# Patient Record
Sex: Male | Born: 1960 | Race: Black or African American | Hispanic: No | Smoking: Current every day smoker
Health system: Southern US, Community
[De-identification: ages and names within clinical notes are randomized; demographics above are authoritative.]

## PROBLEM LIST (undated history)

## (undated) DIAGNOSIS — I509 Heart failure, unspecified: Secondary | ICD-10-CM

## (undated) DIAGNOSIS — F1010 Alcohol abuse, uncomplicated: Secondary | ICD-10-CM

## (undated) DIAGNOSIS — I428 Other cardiomyopathies: Secondary | ICD-10-CM

## (undated) DIAGNOSIS — M545 Low back pain, unspecified: Secondary | ICD-10-CM

## (undated) DIAGNOSIS — F32A Depression, unspecified: Secondary | ICD-10-CM

## (undated) DIAGNOSIS — F329 Major depressive disorder, single episode, unspecified: Secondary | ICD-10-CM

## (undated) DIAGNOSIS — R7401 Elevation of levels of liver transaminase levels: Secondary | ICD-10-CM

## (undated) DIAGNOSIS — R740 Nonspecific elevation of levels of transaminase and lactic acid dehydrogenase [LDH]: Secondary | ICD-10-CM

## (undated) DIAGNOSIS — I10 Essential (primary) hypertension: Secondary | ICD-10-CM

## (undated) DIAGNOSIS — J449 Chronic obstructive pulmonary disease, unspecified: Secondary | ICD-10-CM

## (undated) DIAGNOSIS — Z720 Tobacco use: Secondary | ICD-10-CM

## (undated) DIAGNOSIS — G4730 Sleep apnea, unspecified: Secondary | ICD-10-CM

## (undated) DIAGNOSIS — Z9689 Presence of other specified functional implants: Secondary | ICD-10-CM

## (undated) DIAGNOSIS — H9190 Unspecified hearing loss, unspecified ear: Secondary | ICD-10-CM

## (undated) DIAGNOSIS — K219 Gastro-esophageal reflux disease without esophagitis: Secondary | ICD-10-CM

## (undated) DIAGNOSIS — B1920 Unspecified viral hepatitis C without hepatic coma: Secondary | ICD-10-CM

## (undated) DIAGNOSIS — M1990 Unspecified osteoarthritis, unspecified site: Secondary | ICD-10-CM

## (undated) DIAGNOSIS — G8929 Other chronic pain: Secondary | ICD-10-CM

## (undated) DIAGNOSIS — K279 Peptic ulcer, site unspecified, unspecified as acute or chronic, without hemorrhage or perforation: Secondary | ICD-10-CM

## (undated) DIAGNOSIS — E785 Hyperlipidemia, unspecified: Secondary | ICD-10-CM

## (undated) DIAGNOSIS — E781 Pure hyperglyceridemia: Secondary | ICD-10-CM

## (undated) DIAGNOSIS — N182 Chronic kidney disease, stage 2 (mild): Secondary | ICD-10-CM

## (undated) DIAGNOSIS — K861 Other chronic pancreatitis: Secondary | ICD-10-CM

## (undated) DIAGNOSIS — F1910 Other psychoactive substance abuse, uncomplicated: Secondary | ICD-10-CM

## (undated) HISTORY — DX: Alcohol abuse, uncomplicated: F10.10

## (undated) HISTORY — PX: HARDWARE REMOVAL: SHX979

## (undated) HISTORY — DX: Chronic kidney disease, stage 2 (mild): N18.2

## (undated) HISTORY — PX: BACK SURGERY: SHX140

## (undated) HISTORY — DX: Other chronic pancreatitis: K86.1

## (undated) HISTORY — DX: Other psychoactive substance abuse, uncomplicated: F19.10

## (undated) HISTORY — PX: POSTERIOR LUMBAR FUSION: SHX6036

## (undated) HISTORY — DX: Peptic ulcer, site unspecified, unspecified as acute or chronic, without hemorrhage or perforation: K27.9

## (undated) HISTORY — DX: Other cardiomyopathies: I42.8

## (undated) HISTORY — DX: Nonspecific elevation of levels of transaminase and lactic acid dehydrogenase (ldh): R74.0

## (undated) HISTORY — PX: INCISION AND DRAINAGE ABSCESS: SHX5864

## (undated) HISTORY — DX: Chronic obstructive pulmonary disease, unspecified: J44.9

## (undated) HISTORY — PX: WRIST SURGERY: SHX841

## (undated) HISTORY — PX: FOOT SURGERY: SHX648

## (undated) HISTORY — DX: Elevation of levels of liver transaminase levels: R74.01

## (undated) HISTORY — DX: Hyperlipidemia, unspecified: E78.5

## (undated) HISTORY — DX: Gastro-esophageal reflux disease without esophagitis: K21.9

## (undated) HISTORY — DX: Tobacco use: Z72.0

## (undated) HISTORY — DX: Essential (primary) hypertension: I10

## (undated) HISTORY — DX: Pure hyperglyceridemia: E78.1

---
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
## 1998-01-16 ENCOUNTER — Encounter: Admission: RE | Admit: 1998-01-16 | Discharge: 1998-01-16 | Payer: Self-pay | Admitting: Obstetrics

## 1998-04-20 ENCOUNTER — Encounter: Admission: RE | Admit: 1998-04-20 | Discharge: 1998-04-20 | Payer: Self-pay | Admitting: Internal Medicine

## 1998-04-20 ENCOUNTER — Encounter: Payer: Self-pay | Admitting: Internal Medicine

## 1999-03-20 ENCOUNTER — Encounter (INDEPENDENT_AMBULATORY_CARE_PROVIDER_SITE_OTHER): Payer: Self-pay | Admitting: Internal Medicine

## 1999-04-16 ENCOUNTER — Ambulatory Visit (HOSPITAL_COMMUNITY): Admission: RE | Admit: 1999-04-16 | Discharge: 1999-04-16 | Payer: Self-pay | Admitting: Orthopaedic Surgery

## 1999-04-30 ENCOUNTER — Ambulatory Visit (HOSPITAL_COMMUNITY): Admission: RE | Admit: 1999-04-30 | Discharge: 1999-04-30 | Payer: Self-pay | Admitting: Orthopaedic Surgery

## 1999-05-14 ENCOUNTER — Ambulatory Visit (HOSPITAL_COMMUNITY): Admission: RE | Admit: 1999-05-14 | Discharge: 1999-05-14 | Payer: Self-pay | Admitting: Orthopaedic Surgery

## 2000-05-05 ENCOUNTER — Encounter: Admission: RE | Admit: 2000-05-05 | Discharge: 2000-05-05 | Payer: Self-pay | Admitting: Internal Medicine

## 2000-05-27 ENCOUNTER — Encounter: Admission: RE | Admit: 2000-05-27 | Discharge: 2000-05-27 | Payer: Self-pay | Admitting: Hematology and Oncology

## 2000-12-24 ENCOUNTER — Ambulatory Visit (HOSPITAL_COMMUNITY): Admission: RE | Admit: 2000-12-24 | Discharge: 2000-12-24 | Payer: Self-pay | Admitting: Internal Medicine

## 2000-12-24 ENCOUNTER — Encounter: Admission: RE | Admit: 2000-12-24 | Discharge: 2000-12-24 | Payer: Self-pay | Admitting: Internal Medicine

## 2001-01-02 ENCOUNTER — Ambulatory Visit (HOSPITAL_COMMUNITY): Admission: RE | Admit: 2001-01-02 | Discharge: 2001-01-02 | Payer: Self-pay | Admitting: Internal Medicine

## 2001-01-18 ENCOUNTER — Encounter: Admission: RE | Admit: 2001-01-18 | Discharge: 2001-01-18 | Payer: Self-pay | Admitting: Internal Medicine

## 2001-02-01 ENCOUNTER — Encounter: Admission: RE | Admit: 2001-02-01 | Discharge: 2001-02-01 | Payer: Self-pay | Admitting: Internal Medicine

## 2001-07-14 ENCOUNTER — Encounter: Admission: RE | Admit: 2001-07-14 | Discharge: 2001-07-14 | Payer: Self-pay | Admitting: Internal Medicine

## 2001-07-30 ENCOUNTER — Encounter: Admission: RE | Admit: 2001-07-30 | Discharge: 2001-07-30 | Payer: Self-pay | Admitting: Internal Medicine

## 2001-12-31 ENCOUNTER — Encounter: Admission: RE | Admit: 2001-12-31 | Discharge: 2001-12-31 | Payer: Self-pay | Admitting: Internal Medicine

## 2002-01-04 ENCOUNTER — Encounter: Admission: RE | Admit: 2002-01-04 | Discharge: 2002-01-04 | Payer: Self-pay | Admitting: Internal Medicine

## 2002-11-29 ENCOUNTER — Encounter: Admission: RE | Admit: 2002-11-29 | Discharge: 2002-11-29 | Payer: Self-pay | Admitting: Internal Medicine

## 2002-12-13 ENCOUNTER — Encounter: Admission: RE | Admit: 2002-12-13 | Discharge: 2002-12-13 | Payer: Self-pay | Admitting: Internal Medicine

## 2003-02-16 ENCOUNTER — Encounter: Admission: RE | Admit: 2003-02-16 | Discharge: 2003-02-16 | Payer: Self-pay | Admitting: Internal Medicine

## 2003-02-25 ENCOUNTER — Encounter: Payer: Self-pay | Admitting: *Deleted

## 2003-02-25 ENCOUNTER — Ambulatory Visit (HOSPITAL_COMMUNITY): Admission: RE | Admit: 2003-02-25 | Discharge: 2003-02-25 | Payer: Self-pay | Admitting: *Deleted

## 2003-02-27 ENCOUNTER — Encounter: Admission: RE | Admit: 2003-02-27 | Discharge: 2003-02-27 | Payer: Self-pay | Admitting: Internal Medicine

## 2003-03-04 ENCOUNTER — Encounter: Payer: Self-pay | Admitting: Internal Medicine

## 2003-03-04 ENCOUNTER — Ambulatory Visit (HOSPITAL_COMMUNITY): Admission: RE | Admit: 2003-03-04 | Discharge: 2003-03-04 | Payer: Self-pay | Admitting: Internal Medicine

## 2003-03-07 ENCOUNTER — Encounter: Admission: RE | Admit: 2003-03-07 | Discharge: 2003-03-07 | Payer: Self-pay | Admitting: Internal Medicine

## 2004-02-07 ENCOUNTER — Encounter: Admission: RE | Admit: 2004-02-07 | Discharge: 2004-02-07 | Payer: Self-pay | Admitting: Internal Medicine

## 2004-06-02 ENCOUNTER — Emergency Department (HOSPITAL_COMMUNITY): Admission: EM | Admit: 2004-06-02 | Discharge: 2004-06-02 | Payer: Self-pay | Admitting: Emergency Medicine

## 2004-06-05 ENCOUNTER — Emergency Department (HOSPITAL_COMMUNITY): Admission: EM | Admit: 2004-06-05 | Discharge: 2004-06-05 | Payer: Self-pay | Admitting: Emergency Medicine

## 2004-06-16 ENCOUNTER — Emergency Department (HOSPITAL_COMMUNITY): Admission: EM | Admit: 2004-06-16 | Discharge: 2004-06-16 | Payer: Self-pay | Admitting: Emergency Medicine

## 2005-10-23 ENCOUNTER — Emergency Department (HOSPITAL_COMMUNITY): Admission: EM | Admit: 2005-10-23 | Discharge: 2005-10-23 | Payer: Self-pay | Admitting: Emergency Medicine

## 2005-10-26 ENCOUNTER — Emergency Department (HOSPITAL_COMMUNITY): Admission: EM | Admit: 2005-10-26 | Discharge: 2005-10-26 | Payer: Self-pay | Admitting: Family Medicine

## 2005-10-27 ENCOUNTER — Inpatient Hospital Stay (HOSPITAL_COMMUNITY): Admission: EM | Admit: 2005-10-27 | Discharge: 2005-10-31 | Payer: Self-pay | Admitting: Emergency Medicine

## 2006-09-07 ENCOUNTER — Emergency Department (HOSPITAL_COMMUNITY): Admission: EM | Admit: 2006-09-07 | Discharge: 2006-09-07 | Payer: Self-pay | Admitting: Emergency Medicine

## 2007-01-08 ENCOUNTER — Ambulatory Visit: Payer: Self-pay | Admitting: Internal Medicine

## 2007-01-08 ENCOUNTER — Encounter (INDEPENDENT_AMBULATORY_CARE_PROVIDER_SITE_OTHER): Payer: Self-pay | Admitting: Internal Medicine

## 2007-01-11 LAB — CONVERTED CEMR LAB
Amphetamine Screen, Ur: NEGATIVE
BUN: 11 mg/dL
Barbiturate Quant, Ur: NEGATIVE
Benzodiazepines.: NEGATIVE
CO2: 27 meq/L
Calcium: 9.1 mg/dL
Chloride: 105 meq/L
Cocaine Metabolites: POSITIVE — AB
Creatinine, Ser: 1.27 mg/dL
Creatinine,U: 272.1 mg/dL
Glucose, Bld: 96 mg/dL
Marijuana Metabolite: NEGATIVE
Methadone: NEGATIVE
Opiates: NEGATIVE
Phencyclidine (PCP): NEGATIVE
Potassium: 4.7 meq/L
Propoxyphene: NEGATIVE
Sodium: 141 meq/L

## 2007-01-12 ENCOUNTER — Encounter (INDEPENDENT_AMBULATORY_CARE_PROVIDER_SITE_OTHER): Payer: Self-pay | Admitting: Internal Medicine

## 2007-01-12 DIAGNOSIS — F1410 Cocaine abuse, uncomplicated: Secondary | ICD-10-CM | POA: Insufficient documentation

## 2007-01-13 ENCOUNTER — Ambulatory Visit (HOSPITAL_COMMUNITY): Admission: RE | Admit: 2007-01-13 | Discharge: 2007-01-13 | Payer: Self-pay | Admitting: Internal Medicine

## 2007-01-14 ENCOUNTER — Telehealth (INDEPENDENT_AMBULATORY_CARE_PROVIDER_SITE_OTHER): Payer: Self-pay | Admitting: Internal Medicine

## 2007-01-20 ENCOUNTER — Ambulatory Visit: Payer: Self-pay | Admitting: Internal Medicine

## 2007-01-22 ENCOUNTER — Encounter (INDEPENDENT_AMBULATORY_CARE_PROVIDER_SITE_OTHER): Payer: Self-pay | Admitting: *Deleted

## 2007-01-22 ENCOUNTER — Encounter: Payer: Self-pay | Admitting: Licensed Clinical Social Worker

## 2007-01-27 ENCOUNTER — Telehealth: Payer: Self-pay | Admitting: *Deleted

## 2007-02-17 ENCOUNTER — Encounter (INDEPENDENT_AMBULATORY_CARE_PROVIDER_SITE_OTHER): Payer: Self-pay | Admitting: Internal Medicine

## 2007-02-24 ENCOUNTER — Ambulatory Visit: Payer: Self-pay | Admitting: Internal Medicine

## 2007-02-24 ENCOUNTER — Encounter: Payer: Self-pay | Admitting: Licensed Clinical Social Worker

## 2007-03-05 ENCOUNTER — Telehealth: Payer: Self-pay | Admitting: *Deleted

## 2007-03-08 ENCOUNTER — Telehealth: Payer: Self-pay | Admitting: *Deleted

## 2007-03-09 ENCOUNTER — Encounter (INDEPENDENT_AMBULATORY_CARE_PROVIDER_SITE_OTHER): Payer: Self-pay | Admitting: Internal Medicine

## 2007-03-09 ENCOUNTER — Ambulatory Visit: Payer: Self-pay | Admitting: Infectious Disease

## 2007-03-09 LAB — CONVERTED CEMR LAB
Amphetamine Screen, Ur: NEGATIVE
Barbiturate Quant, Ur: NEGATIVE
Benzodiazepines.: NEGATIVE
Cocaine Metabolites: POSITIVE — AB
Creatinine,U: 40.9 mg/dL
Marijuana Metabolite: NEGATIVE
Methadone: NEGATIVE
Opiates: NEGATIVE
Phencyclidine (PCP): NEGATIVE
Propoxyphene: NEGATIVE

## 2007-03-12 ENCOUNTER — Encounter (INDEPENDENT_AMBULATORY_CARE_PROVIDER_SITE_OTHER): Payer: Self-pay | Admitting: Internal Medicine

## 2007-03-12 ENCOUNTER — Ambulatory Visit: Payer: Self-pay | Admitting: Internal Medicine

## 2007-04-30 ENCOUNTER — Telehealth: Payer: Self-pay | Admitting: *Deleted

## 2007-05-31 ENCOUNTER — Telehealth (INDEPENDENT_AMBULATORY_CARE_PROVIDER_SITE_OTHER): Payer: Self-pay | Admitting: Internal Medicine

## 2007-06-04 ENCOUNTER — Telehealth: Payer: Self-pay | Admitting: *Deleted

## 2007-08-19 ENCOUNTER — Ambulatory Visit: Payer: Self-pay | Admitting: Internal Medicine

## 2007-08-19 DIAGNOSIS — N529 Male erectile dysfunction, unspecified: Secondary | ICD-10-CM | POA: Insufficient documentation

## 2007-08-31 ENCOUNTER — Telehealth: Payer: Self-pay | Admitting: *Deleted

## 2007-09-17 ENCOUNTER — Ambulatory Visit: Payer: Self-pay | Admitting: Internal Medicine

## 2007-09-17 ENCOUNTER — Encounter (INDEPENDENT_AMBULATORY_CARE_PROVIDER_SITE_OTHER): Payer: Self-pay | Admitting: *Deleted

## 2007-09-17 DIAGNOSIS — M5106 Intervertebral disc disorders with myelopathy, lumbar region: Secondary | ICD-10-CM | POA: Insufficient documentation

## 2007-09-22 ENCOUNTER — Telehealth (INDEPENDENT_AMBULATORY_CARE_PROVIDER_SITE_OTHER): Payer: Self-pay | Admitting: Internal Medicine

## 2007-10-14 ENCOUNTER — Encounter (INDEPENDENT_AMBULATORY_CARE_PROVIDER_SITE_OTHER): Payer: Self-pay | Admitting: Internal Medicine

## 2007-10-21 ENCOUNTER — Telehealth: Payer: Self-pay | Admitting: *Deleted

## 2007-10-22 ENCOUNTER — Telehealth: Payer: Self-pay | Admitting: *Deleted

## 2007-11-23 ENCOUNTER — Encounter (INDEPENDENT_AMBULATORY_CARE_PROVIDER_SITE_OTHER): Payer: Self-pay | Admitting: Internal Medicine

## 2007-11-30 ENCOUNTER — Encounter (INDEPENDENT_AMBULATORY_CARE_PROVIDER_SITE_OTHER): Payer: Self-pay | Admitting: Internal Medicine

## 2007-12-26 ENCOUNTER — Inpatient Hospital Stay (HOSPITAL_COMMUNITY): Admission: EM | Admit: 2007-12-26 | Discharge: 2007-12-31 | Payer: Self-pay | Admitting: Emergency Medicine

## 2007-12-26 ENCOUNTER — Encounter (INDEPENDENT_AMBULATORY_CARE_PROVIDER_SITE_OTHER): Payer: Self-pay | Admitting: Internal Medicine

## 2007-12-26 ENCOUNTER — Ambulatory Visit: Payer: Self-pay | Admitting: Internal Medicine

## 2007-12-27 ENCOUNTER — Encounter (INDEPENDENT_AMBULATORY_CARE_PROVIDER_SITE_OTHER): Payer: Self-pay | Admitting: Hospitalist

## 2007-12-28 ENCOUNTER — Encounter (INDEPENDENT_AMBULATORY_CARE_PROVIDER_SITE_OTHER): Payer: Self-pay | Admitting: Internal Medicine

## 2007-12-30 ENCOUNTER — Encounter (INDEPENDENT_AMBULATORY_CARE_PROVIDER_SITE_OTHER): Payer: Self-pay | Admitting: Cardiology

## 2008-01-04 ENCOUNTER — Encounter: Payer: Self-pay | Admitting: Licensed Clinical Social Worker

## 2008-01-05 ENCOUNTER — Encounter: Admission: AD | Admit: 2008-01-05 | Discharge: 2008-01-05 | Payer: Self-pay | Admitting: Dentistry

## 2008-01-05 ENCOUNTER — Ambulatory Visit: Payer: Self-pay | Admitting: Dentistry

## 2008-01-06 ENCOUNTER — Telehealth: Payer: Self-pay | Admitting: Licensed Clinical Social Worker

## 2008-02-15 ENCOUNTER — Encounter (INDEPENDENT_AMBULATORY_CARE_PROVIDER_SITE_OTHER): Payer: Self-pay | Admitting: Internal Medicine

## 2008-03-23 ENCOUNTER — Telehealth: Payer: Self-pay | Admitting: Licensed Clinical Social Worker

## 2008-04-06 ENCOUNTER — Encounter (INDEPENDENT_AMBULATORY_CARE_PROVIDER_SITE_OTHER): Payer: Self-pay | Admitting: Internal Medicine

## 2008-04-06 ENCOUNTER — Ambulatory Visit: Payer: Self-pay | Admitting: Internal Medicine

## 2008-04-17 ENCOUNTER — Encounter: Admission: RE | Admit: 2008-04-17 | Discharge: 2008-07-16 | Payer: Self-pay | Admitting: Neurosurgery

## 2008-04-25 ENCOUNTER — Encounter (INDEPENDENT_AMBULATORY_CARE_PROVIDER_SITE_OTHER): Payer: Self-pay | Admitting: Internal Medicine

## 2008-06-01 ENCOUNTER — Encounter (INDEPENDENT_AMBULATORY_CARE_PROVIDER_SITE_OTHER): Payer: Self-pay | Admitting: Internal Medicine

## 2008-06-01 ENCOUNTER — Ambulatory Visit: Payer: Self-pay | Admitting: *Deleted

## 2008-06-02 ENCOUNTER — Ambulatory Visit (HOSPITAL_COMMUNITY): Admission: RE | Admit: 2008-06-02 | Discharge: 2008-06-02 | Payer: Self-pay | Admitting: *Deleted

## 2008-06-02 LAB — CONVERTED CEMR LAB
BUN: 13 mg/dL (ref 6–23)
CO2: 23 meq/L (ref 19–32)
Calcium: 9 mg/dL (ref 8.4–10.5)
Chloride: 106 meq/L (ref 96–112)
Creatinine, Ser: 1.32 mg/dL (ref 0.40–1.50)
Glucose, Bld: 110 mg/dL — ABNORMAL HIGH (ref 70–99)
Potassium: 4.4 meq/L (ref 3.5–5.3)
Sodium: 140 meq/L (ref 135–145)
Vit D, 1,25-Dihydroxy: 6 — ABNORMAL LOW (ref 30–89)

## 2008-06-27 ENCOUNTER — Encounter (INDEPENDENT_AMBULATORY_CARE_PROVIDER_SITE_OTHER): Payer: Self-pay | Admitting: Internal Medicine

## 2008-07-03 DIAGNOSIS — F1910 Other psychoactive substance abuse, uncomplicated: Secondary | ICD-10-CM | POA: Insufficient documentation

## 2008-07-17 ENCOUNTER — Encounter: Admission: RE | Admit: 2008-07-17 | Discharge: 2008-07-17 | Payer: Self-pay | Admitting: Neurosurgery

## 2008-07-24 ENCOUNTER — Telehealth: Payer: Self-pay | Admitting: Infectious Diseases

## 2008-07-28 DIAGNOSIS — B1920 Unspecified viral hepatitis C without hepatic coma: Secondary | ICD-10-CM

## 2008-07-28 HISTORY — DX: Unspecified viral hepatitis C without hepatic coma: B19.20

## 2008-08-07 ENCOUNTER — Telehealth (INDEPENDENT_AMBULATORY_CARE_PROVIDER_SITE_OTHER): Payer: Self-pay | Admitting: Internal Medicine

## 2008-08-09 ENCOUNTER — Encounter (INDEPENDENT_AMBULATORY_CARE_PROVIDER_SITE_OTHER): Payer: Self-pay | Admitting: Internal Medicine

## 2008-08-23 ENCOUNTER — Ambulatory Visit: Payer: Self-pay | Admitting: Infectious Disease

## 2008-09-05 ENCOUNTER — Encounter (INDEPENDENT_AMBULATORY_CARE_PROVIDER_SITE_OTHER): Payer: Self-pay | Admitting: Internal Medicine

## 2008-09-26 ENCOUNTER — Encounter (INDEPENDENT_AMBULATORY_CARE_PROVIDER_SITE_OTHER): Payer: Self-pay | Admitting: Internal Medicine

## 2008-11-03 ENCOUNTER — Telehealth: Payer: Self-pay | Admitting: Licensed Clinical Social Worker

## 2008-11-06 ENCOUNTER — Encounter: Payer: Self-pay | Admitting: Licensed Clinical Social Worker

## 2008-11-06 ENCOUNTER — Ambulatory Visit: Payer: Self-pay | Admitting: Internal Medicine

## 2008-11-07 ENCOUNTER — Encounter (INDEPENDENT_AMBULATORY_CARE_PROVIDER_SITE_OTHER): Payer: Self-pay | Admitting: Internal Medicine

## 2008-11-15 ENCOUNTER — Ambulatory Visit (HOSPITAL_COMMUNITY): Admission: RE | Admit: 2008-11-15 | Discharge: 2008-11-15 | Payer: Self-pay | Admitting: Internal Medicine

## 2008-11-28 ENCOUNTER — Encounter (INDEPENDENT_AMBULATORY_CARE_PROVIDER_SITE_OTHER): Payer: Self-pay | Admitting: Internal Medicine

## 2008-12-05 DIAGNOSIS — M539 Dorsopathy, unspecified: Secondary | ICD-10-CM | POA: Insufficient documentation

## 2008-12-13 ENCOUNTER — Encounter (INDEPENDENT_AMBULATORY_CARE_PROVIDER_SITE_OTHER): Payer: Self-pay | Admitting: Internal Medicine

## 2008-12-13 ENCOUNTER — Ambulatory Visit: Payer: Self-pay | Admitting: Infectious Disease

## 2008-12-13 LAB — CONVERTED CEMR LAB
BUN: 16 mg/dL (ref 6–23)
CO2: 24 meq/L (ref 19–32)
Calcium: 9.9 mg/dL (ref 8.4–10.5)
Chloride: 107 meq/L (ref 96–112)
Creatinine, Ser: 1.32 mg/dL (ref 0.40–1.50)
GFR calc Af Amer: >60 mL/min (ref >60)
GFR calc non Af Amer: 58 mL/min — ABNORMAL LOW (ref >60)
Glucose, Bld: 99 mg/dL (ref 70–99)
Potassium: 4.9 meq/L (ref 3.5–5.3)
Sodium: 143 meq/L (ref 135–145)

## 2009-02-09 ENCOUNTER — Encounter (INDEPENDENT_AMBULATORY_CARE_PROVIDER_SITE_OTHER): Payer: Self-pay | Admitting: Internal Medicine

## 2009-02-09 ENCOUNTER — Encounter: Payer: Self-pay | Admitting: Internal Medicine

## 2009-02-09 ENCOUNTER — Ambulatory Visit: Payer: Self-pay | Admitting: Infectious Diseases

## 2009-02-09 LAB — CONVERTED CEMR LAB
BUN: 18 mg/dL (ref 6–23)
Bilirubin Urine: NEGATIVE
CO2: 25 meq/L (ref 19–32)
Calcium: 9.8 mg/dL (ref 8.4–10.5)
Chlamydia, Swab/Urine, PCR: NEGATIVE
Chloride: 105 meq/L (ref 96–112)
Creatinine, Ser: 1.33 mg/dL (ref 0.40–1.50)
GC Probe Amp, Urine: NEGATIVE
Glucose, Bld: 80 mg/dL (ref 70–99)
HIV 1 RNA Quant: <48 copies/mL (ref <48)
HIV-1 RNA Quant, Log: <1.68 (ref <1.68)
Hemoglobin, Urine: NEGATIVE
Ketones, ur: NEGATIVE mg/dL
Leukocytes, UA: NEGATIVE
Nitrite: NEGATIVE
Potassium: 4.6 meq/L (ref 3.5–5.3)
Protein, ur: NEGATIVE mg/dL
Sodium: 142 meq/L (ref 135–145)
Specific Gravity, Urine: 1.025 (ref 1.005–1.030)
Urine Glucose: NEGATIVE mg/dL
Urobilinogen, UA: 0.2 (ref 0.0–1.0)
pH: 5.5 (ref 5.0–8.0)

## 2009-02-15 ENCOUNTER — Encounter: Admission: RE | Admit: 2009-02-15 | Discharge: 2009-02-15 | Payer: Self-pay | Admitting: Internal Medicine

## 2009-02-22 ENCOUNTER — Telehealth: Payer: Self-pay | Admitting: *Deleted

## 2009-02-28 ENCOUNTER — Ambulatory Visit: Payer: Self-pay | Admitting: Internal Medicine

## 2009-03-01 ENCOUNTER — Encounter (INDEPENDENT_AMBULATORY_CARE_PROVIDER_SITE_OTHER): Payer: Self-pay | Admitting: Internal Medicine

## 2009-03-02 ENCOUNTER — Telehealth (INDEPENDENT_AMBULATORY_CARE_PROVIDER_SITE_OTHER): Payer: Self-pay | Admitting: Internal Medicine

## 2009-03-02 LAB — CONVERTED CEMR LAB
HCV Ab: REACTIVE — AB
Hep B Core Total Ab: NEGATIVE
Hep B S Ab: NEGATIVE
Hepatitis B Surface Ag: NEGATIVE

## 2009-03-08 ENCOUNTER — Ambulatory Visit: Payer: Self-pay | Admitting: Internal Medicine

## 2009-03-08 LAB — CONVERTED CEMR LAB
ALT: 18 units/L (ref 0–53)
AST: 24 units/L (ref 0–37)
Albumin: 4.5 g/dL (ref 3.5–5.2)
Alkaline Phosphatase: 71 units/L (ref 39–117)
Bilirubin, Direct: 0.1 mg/dL (ref 0.0–0.3)
Indirect Bilirubin: 0.2 mg/dL (ref 0.0–0.9)
Total Bilirubin: 0.3 mg/dL (ref 0.3–1.2)
Total Protein: 7.1 g/dL (ref 6.0–8.3)

## 2009-03-13 ENCOUNTER — Ambulatory Visit (HOSPITAL_COMMUNITY): Admission: RE | Admit: 2009-03-13 | Discharge: 2009-03-13 | Payer: Self-pay | Admitting: Internal Medicine

## 2009-03-13 ENCOUNTER — Ambulatory Visit: Payer: Self-pay | Admitting: Internal Medicine

## 2009-03-13 ENCOUNTER — Encounter: Payer: Self-pay | Admitting: Internal Medicine

## 2009-03-13 DIAGNOSIS — F17200 Nicotine dependence, unspecified, uncomplicated: Secondary | ICD-10-CM | POA: Insufficient documentation

## 2009-03-19 ENCOUNTER — Telehealth (INDEPENDENT_AMBULATORY_CARE_PROVIDER_SITE_OTHER): Payer: Self-pay | Admitting: Internal Medicine

## 2009-03-28 ENCOUNTER — Ambulatory Visit: Payer: Self-pay | Admitting: Internal Medicine

## 2009-03-28 ENCOUNTER — Encounter (INDEPENDENT_AMBULATORY_CARE_PROVIDER_SITE_OTHER): Payer: Self-pay | Admitting: Internal Medicine

## 2009-04-10 ENCOUNTER — Ambulatory Visit: Payer: Self-pay | Admitting: Internal Medicine

## 2009-05-01 ENCOUNTER — Encounter: Payer: Self-pay | Admitting: Internal Medicine

## 2009-07-17 ENCOUNTER — Ambulatory Visit: Payer: Self-pay | Admitting: Internal Medicine

## 2009-07-17 LAB — CONVERTED CEMR LAB
Bilirubin Urine: NEGATIVE
Blood in Urine, dipstick: NEGATIVE
Glucose, Urine, Semiquant: NEGATIVE
Ketones, urine, test strip: NEGATIVE
Nitrite: NEGATIVE
Protein, U semiquant: 30
Specific Gravity, Urine: >=1.03
Urobilinogen, UA: 0.2
WBC Urine, dipstick: NEGATIVE
pH: 5

## 2009-07-18 ENCOUNTER — Ambulatory Visit: Payer: Self-pay | Admitting: Internal Medicine

## 2009-07-18 ENCOUNTER — Encounter (INDEPENDENT_AMBULATORY_CARE_PROVIDER_SITE_OTHER): Payer: Self-pay | Admitting: Internal Medicine

## 2009-07-18 LAB — CONVERTED CEMR LAB
Bacteria, UA: NONE SEEN
Bacteria, UA: NONE SEEN
Bilirubin Urine: NEGATIVE
Bilirubin Urine: NEGATIVE
Casts: NONE SEEN /lpf
Casts: NONE SEEN /lpf
Chlamydia, Swab/Urine, PCR: NEGATIVE
Crystals: NONE SEEN
GC Probe Amp, Urine: NEGATIVE
Hemoglobin, Urine: NEGATIVE
Hemoglobin, Urine: NEGATIVE
Ketones, ur: NEGATIVE mg/dL
Ketones, ur: NEGATIVE mg/dL
Leukocytes, UA: NEGATIVE
Leukocytes, UA: NEGATIVE
Nitrite: NEGATIVE
Nitrite: NEGATIVE
Protein, ur: NEGATIVE mg/dL
Protein, ur: NEGATIVE mg/dL
RBC / HPF: NONE SEEN (ref <3)
RBC / HPF: NONE SEEN (ref <3)
Specific Gravity, Urine: 1.029 (ref 1.005–1.0)
Specific Gravity, Urine: 1.026 (ref 1.005–1.0)
Urine Glucose: NEGATIVE mg/dL
Urine Glucose: NEGATIVE mg/dL
Urobilinogen, UA: 0.2 (ref 0.0–1.0)
Urobilinogen, UA: 0.2 (ref 0.0–1.0)
WBC, UA: NONE SEEN cells/hpf (ref <3)
pH: 5.5 (ref 5.0–8.0)
pH: 5.5 (ref 5.0–8.0)

## 2009-08-01 ENCOUNTER — Encounter (INDEPENDENT_AMBULATORY_CARE_PROVIDER_SITE_OTHER): Payer: Self-pay | Admitting: Internal Medicine

## 2009-08-01 ENCOUNTER — Ambulatory Visit (HOSPITAL_COMMUNITY): Admission: RE | Admit: 2009-08-01 | Discharge: 2009-08-01 | Payer: Self-pay | Admitting: Internal Medicine

## 2009-08-01 ENCOUNTER — Ambulatory Visit: Payer: Self-pay | Admitting: Internal Medicine

## 2009-08-02 ENCOUNTER — Encounter (INDEPENDENT_AMBULATORY_CARE_PROVIDER_SITE_OTHER): Payer: Self-pay | Admitting: Internal Medicine

## 2009-08-06 LAB — CONVERTED CEMR LAB
ALT: 21 units/L (ref 0–53)
AST: 20 units/L (ref 0–37)
Albumin: 5 g/dL (ref 3.5–5.2)
Alkaline Phosphatase: 85 units/L (ref 39–117)
BUN: 18 mg/dL (ref 6–23)
Basophils Absolute: 0 10*3/uL (ref 0.0–0.1)
Basophils Relative: 0 % (ref 0–1)
CO2: 18 meq/L — ABNORMAL LOW (ref 19–32)
Calcium: 9.5 mg/dL (ref 8.4–10.5)
Chloride: 102 meq/L (ref 96–112)
Creatinine, Ser: 1.2 mg/dL (ref 0.40–1.50)
Eosinophils Absolute: 0.1 10*3/uL (ref 0.0–0.7)
Eosinophils Relative: 2 % (ref 0–5)
Glucose, Bld: 97 mg/dL (ref 70–99)
HCT: 48.9 % (ref 39.0–52.0)
Hemoglobin: 15.8 g/dL (ref 13.0–17.0)
Lipase: 44 units/L (ref 0–75)
Lymphocytes Relative: 49 % — ABNORMAL HIGH (ref 12–46)
Lymphs Abs: 3.5 10*3/uL (ref 0.7–4.0)
MCHC: 32.3 g/dL (ref 30.0–36.0)
MCV: 77.6 fL — ABNORMAL LOW (ref >=78.0)
Monocytes Absolute: 0.5 10*3/uL (ref 0.1–1.0)
Monocytes Relative: 7 % (ref 3–12)
Neutro Abs: 3 10*3/uL (ref 1.7–7.7)
Neutrophils Relative %: 42 % — ABNORMAL LOW (ref 43–77)
PSA: 0.5 ng/mL (ref 0.10–4.00)
Platelets: 218 10*3/uL (ref 150–400)
Potassium: 4.2 meq/L (ref 3.5–5.3)
RBC: 6.3 M/uL — ABNORMAL HIGH (ref 4.22–5.81)
RDW: 15.9 % — ABNORMAL HIGH (ref 11.5–15.5)
Sodium: 138 meq/L (ref 135–145)
Total Bilirubin: 0.6 mg/dL (ref 0.3–1.2)
Total Protein: 7.9 g/dL (ref 6.0–8.3)
WBC: 7.1 10*3/uL (ref 4.0–10.5)

## 2009-08-30 ENCOUNTER — Encounter: Admission: RE | Admit: 2009-08-30 | Discharge: 2009-11-28 | Payer: Self-pay | Admitting: Neurosurgery

## 2009-09-10 ENCOUNTER — Ambulatory Visit: Payer: Self-pay | Admitting: Internal Medicine

## 2009-10-01 ENCOUNTER — Encounter: Payer: Self-pay | Admitting: Internal Medicine

## 2009-10-15 ENCOUNTER — Encounter: Payer: Self-pay | Admitting: Internal Medicine

## 2009-10-26 ENCOUNTER — Encounter: Payer: Self-pay | Admitting: Internal Medicine

## 2009-11-06 ENCOUNTER — Telehealth: Payer: Self-pay | Admitting: Licensed Clinical Social Worker

## 2009-11-08 ENCOUNTER — Encounter: Payer: Self-pay | Admitting: Internal Medicine

## 2009-12-05 ENCOUNTER — Telehealth: Payer: Self-pay | Admitting: Internal Medicine

## 2009-12-06 ENCOUNTER — Telehealth: Payer: Self-pay | Admitting: Internal Medicine

## 2010-03-05 ENCOUNTER — Encounter: Payer: Self-pay | Admitting: *Deleted

## 2010-03-19 ENCOUNTER — Ambulatory Visit: Payer: Self-pay | Admitting: Internal Medicine

## 2010-03-19 DIAGNOSIS — M21619 Bunion of unspecified foot: Secondary | ICD-10-CM | POA: Insufficient documentation

## 2010-03-19 DIAGNOSIS — L299 Pruritus, unspecified: Secondary | ICD-10-CM | POA: Insufficient documentation

## 2010-03-19 DIAGNOSIS — F528 Other sexual dysfunction not due to a substance or known physiological condition: Secondary | ICD-10-CM | POA: Insufficient documentation

## 2010-04-30 ENCOUNTER — Encounter: Payer: Self-pay | Admitting: Internal Medicine

## 2010-07-04 ENCOUNTER — Ambulatory Visit (HOSPITAL_COMMUNITY)
Admission: RE | Admit: 2010-07-04 | Discharge: 2010-07-04 | Payer: Self-pay | Source: Home / Self Care | Attending: Internal Medicine | Admitting: Internal Medicine

## 2010-07-04 ENCOUNTER — Ambulatory Visit: Payer: Self-pay | Admitting: Internal Medicine

## 2010-07-04 DIAGNOSIS — R059 Cough, unspecified: Secondary | ICD-10-CM | POA: Insufficient documentation

## 2010-07-04 DIAGNOSIS — M25569 Pain in unspecified knee: Secondary | ICD-10-CM | POA: Insufficient documentation

## 2010-07-04 DIAGNOSIS — R05 Cough: Secondary | ICD-10-CM

## 2010-07-25 ENCOUNTER — Telehealth: Payer: Self-pay | Admitting: Internal Medicine

## 2010-08-02 ENCOUNTER — Ambulatory Visit: Admit: 2010-08-02 | Payer: Self-pay

## 2010-08-02 ENCOUNTER — Ambulatory Visit: Admit: 2010-08-02 | Payer: Self-pay | Admitting: Family Medicine

## 2010-08-06 ENCOUNTER — Telehealth: Payer: Self-pay | Admitting: *Deleted

## 2010-08-12 ENCOUNTER — Ambulatory Visit: Admit: 2010-08-12 | Payer: Self-pay | Admitting: Family Medicine

## 2010-08-27 NOTE — Assessment & Plan Note (Signed)
Summary: per gayle f/u [mkj]   Vital Signs:  Patient profile:   50 year old male Height:      69 inches (175.26 cm) Weight:      198.7 pounds (90.32 kg) BMI:     29.45 Temp:     99.1 degrees F (37.28 degrees C) oral Pulse rate:   65 / minute BP sitting:   121 / 76  (right arm) Cuff size:   large  Vitals Entered By: Krystal Eaton Duncan Dull) (February 28, 2009 3:41 PM) CC: pt c/o coughing mostly with lying down and sore throat ongoing for about , and lab results from last visit Pain Assessment Patient in pain? yes     Location: back Intensity: 6 Type: sharp Onset of pain  started 3 days ago  Nutritional Status BMI of 25 - 29 = overweight  Have you ever been in a relationship where you felt threatened, hurt or afraid?No   Does patient need assistance? Functional Status Self care Ambulation Normal   Primary Care Provider:  Marinda Elk MD  CC:  pt c/o coughing mostly with lying down and sore throat ongoing for about and and lab results from last visit.  History of Present Illness: Pt has had continued throat pain and cough which have been going on for about 2 months.  Throat is "sore."  Cough is productive of some sputum but the patient has not examined it.  He denies fevers, or chills.  He endorses diarrhea up to 5 times a day which comes and goes for many months.  Some times green in color, non-bloody.  Has not coughed up blood.  Denies weight loss.  Denies night sweats.  Denies runny nose or sneezing.  Pt has cut back on smoking, but now has from4-8 cigarettes a week. Has no known sick contacts, has not traveled, no known TB contacts.  Denies hemoptysis.  Is coughing daily.  It has been stable as far as frequency and sputum production.  Throat is more irritated when he is lying down.  Has not had similar episodes in the past.  No pets.  Preventive Screening-Counseling & Management  Alcohol-Tobacco     Alcohol type: beer /drinks at times     Smoking Status:  quit > 6 months     Smoking Cessation Counseling: yes     Packs/Day: 1 pack per week     Year Started: at the age of 63     Year Quit: 1/ 2009  Current Medications (verified): 1)  Viagra 100 Mg  Tabs (Sildenafil Citrate) .... Take One Tablet One Hour Before Sex 2)  Neurontin 300 Mg Caps (Gabapentin) .... Take 1 Tablet By Mouth Two Times A Day As Needed For Numbness 3)  Hydrochlorothiazide 25 Mg Tabs (Hydrochlorothiazide) .... One By Mouth Once Daily 4)  Lisinopril 10 Mg Tabs (Lisinopril) .... Take 1 Tablet By Mouth Once A Day 5)  Omeprazole 40 Mg Cpdr (Omeprazole) .... Take 1 Tablet By Mouth Once A Day  Allergies: 1)  ! Vicodin  Social History: The patient lives in [place] He is disabled He has a 10 pack year history of smoking He abuses cocaine, though he states he has been clean for a while as of 11/09 He last drank alcohol one year ago  Review of Systems General:  Denies chills and fever. ENT:  Complains of sore throat. CV:  Denies chest pain or discomfort and palpitations. Resp:  Complains of cough, sputum productive, and wheezing; denies chest  discomfort, coughing up blood, and shortness of breath. GI:  Complains of diarrhea; denies abdominal pain, bloody stools, constipation, nausea, and vomiting. Neuro:  Denies numbness and weakness. Allergy:  Complains of itching eyes.  Physical Exam  General:  NAD alert Head:  normocephalic and atraumatic.   Nose:  no nasal discharge.  some nasal mucosa erythema Mouth:  no exudate, mild erythema Neck:  no cervical LAD, supple.   Lungs:  normal respiratory effort, normal breath sounds, no crackles, and no wheezes.   Heart:  normal rate, regular rhythm, no murmur, no gallop, and no rub.   Abdomen:  soft, non-tender, normal bowel sounds, no distention, no masses, no guarding, no rigidity, and no hepatomegaly.   Neurologic:  non-focal   Impression & Recommendations:  Problem # 1:  COUGH (ICD-786.2)  Pt has been coughing for  several months in the context of continued tobacco use.  His increased PPI dose 2 weeks ago did not help with his symptoms. I feel this is most likely chronic cough from tobacco use.  Will check PFT's to evaluate for COPD vs. cough variant asthma.  Pt may benefit from an albuterol inhaler.  His lung exam is benign.  No indication of PNA, given normal lung exam and lack of fevers.  Will also give some samples of zyrtec in case there is a post nasal drip component to his cough.  Orders: PFT Baseline-Pre/Post Bronchodiolator (PFT Baseline-Pre/Pos)  Problem # 2:  SEXUALLY TRANSMITTED DISEASE, EXPOSURE TO (ICD-V01.6)  Will check a chronic hepatitis panel today as the patient has some concern about recent unprotected sexual encounters.  All other STI's were checked at last visit and found to be negative.  Orders: T-Hepatitis B Core Antibody ([id number]) T-Hepatitis B Surface Antibody ([id number]) T-Hepatitis B Surface Antigen ([id number]) T-Hepatitis C Antibody ([id number])  Pt's Hep C Ab was reactive.  He will return for confirmatory testing.  Problem # 3:  SORE THROAT (ICD-462) The patient has had a sore throat over the last few months.  His exam is unremarkable except for some mild oromucosal erythema.  There is no exudate or LAD on exam.  This may be related to postnasal drip.  The patient will be given Zyrtec samples and Rx.  May be related to reflux, the patient will continue his omeprazole.  Problem # 4:  ELEVATED BLOOD PRESSURE (ICD-796.2) His BP is back at goal today back on his anti-hypertensives.  His updated medication list for this problem includes:    Hydrochlorothiazide 25 Mg Tabs (Hydrochlorothiazide) ..... One by mouth once daily    Lisinopril 10 Mg Tabs (Lisinopril) .[name]... Take 1 tablet by mouth once a day  Complete Medication List: 1)  Viagra 100 Mg Tabs (Sildenafil citrate) .... Take one tablet one hour before sex 2)  Neurontin 300 Mg Caps (Gabapentin) .... Take 1 tablet  by mouth two times a day as needed for numbness 3)  Hydrochlorothiazide 25 Mg Tabs (Hydrochlorothiazide) .... One by mouth once daily 4)  Lisinopril 10 Mg Tabs (Lisinopril) .... Take 1 tablet by mouth once a day 5)  Omeprazole 40 Mg Cpdr (Omeprazole) .... Take 1 tablet by mouth once a day 6)  Ventolin Hfa 108 (90 Base) Mcg/act Aers (Albuterol sulfate) .... 2 puffs inhaled every 4-6 hrs as needed for cough 7)  Zyrtec Allergy 10 Mg Tabs (Cetirizine hcl) .... Take 1 tablet by mouth once a day  Other Orders: Tetanus Toxoid w/Dx ([id number]) Admin 1st Vaccine (24401) Future Orders: T-Hepatitis C Viral Load ([id number]) .Marland KitchenMarland Kitchen  03/16/2009 T-Hepatic Function [phone]) ... 03/16/2009  Patient Instructions: 1)  Please schedule a follow-up appointment in 3 months. 2)  You will get Pulmonary function tests done.  They will call you with an appointment. 3)  Use your new inhaler every 4-6 hrs (2 puffs) as needed for cough. 4)  Take the Zyrtec samples that we gave you, and if they help you feel better you can buy some more over the counter and continue using. 5)  Keep cutting back on your smoking. 6)  We will call you with your lab results.  Prevention & Chronic Care Immunizations   Influenza vaccine: Not documented    Tetanus booster: 02/28/2009: Td    Pneumococcal vaccine: Not documented  Other Screening   PSA: Not documented   Smoking status: quit > 6 months  (02/28/2009)  Lipids   Total Cholesterol: Not documented   LDL: Not documented   LDL Direct: Not documented   HDL: Not documented   Triglycerides: Not documented   Nursing Instructions: Give Td booster today    Process Orders Check Orders Results:     [facility]: ABN not required for this insurance Tests Sent for requisitioning (March 02, 2009 3:03 PM):     02/28/2009: [facility] -- T-Hepatitis B Core Antibody [[id number]] (signed)     02/28/2009: [facility] --  T-Hepatitis B Surface Antibody [[id number]] (signed)     02/28/2009: [facility] -- T-Hepatitis B Surface Antigen [[id number]] (signed)     02/28/2009: [facility] -- T-Hepatitis C Antibody [[id number]] (signed)     03/16/2009: [facility] -- T-Hepatitis C Viral Load [phone] (signed)     03/16/2009: [facility] -- T-Hepatic Function [phone] (signed)    Immunizations Administered:  Tetanus Vaccine:    Vaccine Type: Td    Site: left deltoid    Mfr: Sanofi Pasteur    Dose: 0.5 ml    Route: IM    Given by: Starleen Arms CMA    Exp. Date: 10/31/2010    Lot #: [id number]    VIS given: 06/15/07 version given February 28, 2009.

## 2010-08-27 NOTE — Miscellaneous (Signed)
Summary: ED Pain Care Plan  Clinical Lists Changes   Patient's name and MRN given to ED for designation of "Pain Care Plan" based on physician notes and notation in advanced directive screen. Dorie Rank RN  March 05, 2010 10:28 AM

## 2010-08-27 NOTE — Progress Notes (Signed)
Summary: Refill/gh  Phone Note Refill Request   Refills Requested: Medication #1:  HYDROCHLOROTHIAZIDE 25 MG TABS one by mouth once daily Pt says that the Lisinopril makes him cough.  Called for a refill on the HCTZ  Initial call taken by: Angelina Ok RN,  Dec 05, 2009 11:41 AM    Prescriptions: HYDROCHLOROTHIAZIDE 25 MG TABS (HYDROCHLOROTHIAZIDE) one by mouth once daily  #90 x 4   Entered and Authorized by:   Darnelle Maffucci MD   Signed by:   Darnelle Maffucci MD on 12/06/2009   Method used:   Electronically to        [facility] [phone]* (retail)       [address]       [place], [place]  [address]       Ph: [phone]       Fax: [phone]   RxID:   [id number]

## 2010-08-27 NOTE — Miscellaneous (Signed)
Summary: Social Work Referral   Social Work Evaluation Date  01/22/2007 Patient name Jerome Adams  Primary MD   : Peggye Pitt Social Worker's name : Dorothe Pea MSW- LCSW  Home [phone]    Cell phone: .  [name]     Alternate phone: . [name]       Individual making referral: Dr. Ardyth Harps  Primary Reason for Referral:   Direct Counseling and Support by Clinical Social Work   Assist with Disability Process or Referral to Voc. Rehab Comments Back problems since 1999.  Dx is herniated disc; chronic back pain. Can't sit or stand for long periods of time.  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 Living with friends right now.   Connected to [facility]:  prescribed Trazadone and fluoxetine for depression.    Disablity in progress/[employer] reviewing case.  Needs assist with completion of F/S application.   Neurosurgery referral in process with [facility] Angelina Ok working on ) Action taken by Social Work: Telephone assessment and counseling. Encouraged patient to pursue disabilty claim.  F/S form was completed by the physician and I am mailing directly to patient's home address at [address] Maybe opportunity for rehabilitation via surgery but unknown until neuro evaluates.  Encouraged patient to reconnect to MH as he had not been there for a few months/ he reported feeling better on the trazadone and fluoxetine.   Social Work as needed; encouraged continued contact especially should pt. encounter any further barriers.   Route to MD re: MH involvement and medications.

## 2010-08-27 NOTE — Assessment & Plan Note (Signed)
Summary: ACUTE-HIGH BLOOD PRESSURE/CFB(FELIZ)   Vital Signs:  Patient Profile:   50 Years Old Male Height:     69 inches (175.26 cm) Weight:      199.04 pounds (90.47 kg) BMI:     29.50 Temp:     98.4 degrees F (36.89 degrees C) oral Pulse rate:   80 / minute BP sitting:   139 / 90  (left arm)  Pt. in pain?   yes    Location:   lower back, left leg    Intensity:   8    Type:       aching  Vitals Entered By: Angelina Ok RN (August 23, 2008 2:07 PM)              Is Patient Diabetic? No Nutritional Status BMI of 25 - 29 = overweight  Have you ever been in a relationship where you felt threatened, hurt or afraid?No   Does patient need assistance? Functional Status Self care Ambulation Normal     PCP:  Marinda Elk MD  Chief Complaint:  Blood pressure elevation, Palpations occassionally, and .  History of Present Illness: Jerome Adams is a 50 yo man with PMH as outlined in chart.  He is here today because he has been to donate plasma and has not been allowed to because his DBP has ranged 100-110.  States he also has occasional palpitations.  Denies any blurry vision, cp, sob, headache, etc.    Serial Vital Signs/Assessments:  Time      Position  BP       Pulse  Resp  Temp     By                     142/100                        Mariea Stable MD    Prior Medications Reviewed Using: Patient Recall  Updated Prior Medication List: VIAGRA 100 MG  TABS (SILDENAFIL CITRATE) take one tablet one hour before sex LIDODERM 5 % PTCH (LIDOCAINE) once daily once a day for 12 hrs keep 12hr off. NEURONTIN 300 MG CAPS (GABAPENTIN) Take 1 tablet by mouth two times a day as needed for numbness HYDROCHLOROTHIAZIDE 25 MG TABS (HYDROCHLOROTHIAZIDE) one by mouth once daily  Current Allergies: ! VICODIN  Past Medical History:    Reviewed history from 04/06/2008 and no changes required:       Back Pain with radiculopathy       PUD       PSA, forged [facility]  prescription, fired.   Social History:    Reviewed history from 06/01/2008 and no changes required:       The patient lives in [place]       He is disabled       He has a 10 pack year history of smoking        He abuses cocaine, though he states he has been clean for a while as of 11/09       He last drank alcohol one year ago   Risk Factors:  Tobacco use:  current    Year started:  at the age of 26    Cigarettes:  Yes -- 1/3 pack(s) per day    Counseled to quit/cut down tobacco use:  yes Alcohol use:  yes    Type:  beer /drinks at times Exercise:  yes    Times per  week:  2-3    Type:  PT Seatbelt use:  100 %   Review of Systems      See HPI   Physical Exam  General:     alert and cooperative to examination.   Head:     normocephalic.   Eyes:     vision grossly intact, pupils equal, pupils round, and pupils reactive to light.  anicteric Neck:     no JVD.   Lungs:     normal respiratory effort, no accessory muscle use, normal breath sounds, no crackles, and no wheezes.   Heart:     normal rate, regular rhythm, no murmur, no gallop, no rub, and no JVD.   Abdomen:     normal bowel sounds.   Extremities:     no edema Neurologic:     alert & oriented X3, cranial nerves II-XII intact, and strength normal in all extremities.   Psych:     Oriented X3, memory intact for recent and remote, and normally interactive.    slightly anxious    Impression & Recommendations:  Problem # 1:  ELEVATED BLOOD PRESSURE (ICD-796.2) BP is elevated today, I am not totally convinced pt is taking his medications as he states.  However, BP reportedly high at facility to donate plasma.  Slightly high here, rechecked myselft and 142/100 (c/w his reports).  Therefore, will start low dose ACEI and recheck in 1 month.  Pt has normal renal function, therefore, repeat BMET at that time.  His updated medication list for this problem includes:    Hydrochlorothiazide 25 Mg Tabs  (Hydrochlorothiazide) ..... One by mouth once daily    Lisinopril 10 Mg Tabs (Lisinopril) .[name]... Take 1 tablet by mouth once a day  BP today: 139/90 Prior BP: 124/90 (06/01/2008)  Labs Reviewed: Creat: 1.32 (06/01/2008)  Instructed in low sodium diet (DASH Handout) and behavior modification.   Orders: T-Basic Metabolic Panel [phone]) T-Drug Screen-Urine, (single) [id number])   Problem # 2:  COCAINE ABUSE (ICD-305.60) Pt has h/o cociane use.  Denies any since early-mid 2009.  States he was hospitalized for chest pain, after which he was too scared to continue using.  However, pt seems a bit deceptive, and if considering use of B blocker in future, need to make sure he is not using (or use coreg in case he still does).  Will check UDS today.  Orders: T-Drug Screen-Urine, (single) [id number])   Problem # 3:  PSYCHOSOCIAL PROBLEM (ICD-V62.9) Pt complains of financial issues as well as multiple other stressors.  Have discussed with pt and agreed to meet with Jerome Adams for further eval of situation.  Could also consider probing at substance abuse problem further since he denies it during visit.   Complete Medication List: 1)  Viagra 100 Mg Tabs (Sildenafil citrate) .... Take one tablet one hour before sex 2)  Lidoderm 5 % Ptch (Lidocaine) .... Once daily once a day for 12 hrs keep 12hr off. 3)  Neurontin 300 Mg Caps (Gabapentin) .... Take 1 tablet by mouth two times a day as needed for numbness 4)  Hydrochlorothiazide 25 Mg Tabs (Hydrochlorothiazide) .... One by mouth once daily 5)  Lisinopril 10 Mg Tabs (Lisinopril) .... Take 1 tablet by mouth once a day   Patient Instructions: 1)  Please schedule a follow-up appointment in 1 month for BP follow up. 2)  Please schedule with Jerome Adams ASAP, regarding finances, psychosocial stressors. 3)  Start new medicine listed below. 4)  Continue hydrochlorothiazide. 5)  Will see you back in 1 month to recheck your blood  pressure and labs. 6)  will check labs today. 7)  Will discuss pain issues with Dr. Robb Matar.   Prescriptions: LISINOPRIL 10 MG TABS (LISINOPRIL) Take 1 tablet by mouth once a day  #30 x 0   Entered and Authorized by:   Mariea Stable MD   Signed by:   Mariea Stable MD on 08/23/2008   Method used:   Print then Give to Patient   RxID:   [id number]

## 2010-08-27 NOTE — Progress Notes (Signed)
Summary: Lisinopril  Phone Note Call from Patient   Caller: Patient Call For: Jerome Maffucci MD Summary of Call: Call from pt says that the Lisinopril is making him cough. Angelina Ok RN  Dec 06, 2009 2:59 PM  Pt is currently holding the medication. Angelina Ok RN  Dec 06, 2009 3:00 PM  Initial call taken by: Angelina Ok RN,  Dec 06, 2009 3:00 PM  Follow-up for Phone Call        please advise the patient not to take lisinopril anymore, and to arrange a f/u at the [facility] to give him an alternative medication. Follow-up by: Jerome Maffucci MD,  Dec 09, 2009 5:36 PM

## 2010-08-27 NOTE — Progress Notes (Signed)
Summary: Soc. Work  Nurse, children's placed by: Soc. Work Call placed to: Patient Summary of Call: The patient is requesting a letter of support relating to his disability.  Agreed to write letter.  His disability hearing is April 25th.

## 2010-08-27 NOTE — Assessment & Plan Note (Signed)
Summary: FU VISIT/DS   Vital Signs:  Patient profile:   50 year old male Height:      70 inches (177.80 cm) Weight:      217.3 pounds (98.77 kg) BMI:     31.29 O2 Sat:      98 % on Room air Temp:     97.1 degrees F (36.17 degrees C) oral Pulse rate:   85 / minute BP sitting:   144 / 92  (right arm)  Vitals Entered By: Stanton Kidney Ditzler RN (March 19, 2010 9:24 AM)  O2 Flow:  Room air Is Patient Diabetic? No Pain Assessment Patient in pain? yes     Location: left leg and back Intensity: 7 Type: throbbing Onset of pain  from surgery Nutritional Status BMI of > 30 = obese Nutritional Status Detail appetite good  Have you ever been in a relationship where you felt threatened, hurt or afraid?denies   Does patient need assistance? Functional Status Self care Ambulation Normal Comments Wants handicapped parking. Refills on meds. Stopped med due to nonproductive cough - better. Ck right foot. Rash on back for years - itching. SOB past 1-2 weeks.   Primary Care Provider:  Marinda Elk MD   History of Present Illness: 50 yr old man with pmhx as described below comes to the clinic for followup. pt has multiple complaints and requests, mainly back itching, and right foot pain.   1. ED, pt was getting viagra 100mg  once daily for past 6 months, now would like another refill.   2. Pruritis, no rash on back, only itching for past 1-2 weeks.   3. Cough from lisinopril has resolved, but BP elevated, requests alternative rx to lisinopril.   Patient has chronic back pain. Patient had two surgeries last one was May 2010. Denies saddle anesthesia, bowel or bladder incontinence.   Patient is feeling well and denies CP, abdominal pain, nausea, vomiting, HA's, palpitations, blurred vision. fever, chills, diarrhea, constipation or SOB.   Depression History:      The patient is having a depressed mood most of the day but denies diminished interest in his usual daily activities.          Preventive Screening-Counseling & Management  Alcohol-Tobacco     Alcohol drinks/day: 0     Alcohol type: beer /drinks at times     Smoking Status: current     Smoking Cessation Counseling: yes     Packs/Day: 1/3 ppd     Year Started: at the age of 35     Year Quit: 1/ 2009  Caffeine-Diet-Exercise     Caffeine use/day: no     Does Patient Exercise: no     Type of exercise: PT     Times/week: 2-3  Current Medications (verified): 1)  Viagra 100 Mg  Tabs (Sildenafil Citrate) .... Take One Tablet One Hour Before Sex 2)  Neurontin 300 Mg Caps (Gabapentin) .... Take 1 Tablet By Mouth Two Times A Day As Needed For Numbness 3)  Hydrochlorothiazide 25 Mg Tabs (Hydrochlorothiazide) .... One By Mouth Once Daily 4)  Losartan Potassium 50 Mg Tabs (Losartan Potassium) .... Take 1 Tablet By Mouth Once A Day 5)  Protonix 40 Mg Tbec (Pantoprazole Sodium) .... Take One Tablet Daily. 6)  Ventolin Hfa 108 (90 Base) Mcg/act Aers (Albuterol Sulfate) .... 2 Puffs Inhaled Every 4-6 Hrs As Needed For Cough 7)  Zyrtec Allergy 10 Mg Tabs (Cetirizine Hcl) .... Take 1 Tablet By Mouth Once A Day 8)  Meclizine Hcl 12.5 Mg Tabs (Meclizine Hcl) .... Take Every 6 Hours As Needed For Dizzyness. 9)  Zoloft 100 Mg Tabs (Sertraline Hcl) .... One Tablet Daily 10)  Flomax 0.4 Mg Caps (Tamsulosin Hcl) .... Take 1 Tablet By Mouth Once A Day 11)  Anti-Hist 25 Mg Caps (Diphenhydramine Hcl) .... Take 1 Tablet By Mouth Two Times A Day As Needed For Itching.  Allergies (verified): 1)  ! Vicodin  Social History: Packs/Day:  1/3 ppd  Review of Systems       as per HPI  Physical Exam  General:  alert and well-developed.   Lungs:  normal respiratory effort and normal breath sounds.   Heart:  normal rate, regular rhythm, and no murmur.   Abdomen:  soft, non-tender, and normal bowel sounds.   Msk:  .no joint swelling, no joint warmth, and no redness over joints.    Extremities:  no edema, right foot bunion.   Neurologic:  alert & oriented X3 and cranial nerves II-XII intact.   Skin:  back has not lesions.   Impression & Recommendations:  Problem # 1:  BUNION (ICD-727.1) Patient c/o of right foot pain, will refer to podiatry for further eval.   Orders: Podiatry Referral (Podiatry)  Problem # 2:  PRURITUS (ICD-698.9) generalized on back. no rash, will give benadryl.   Problem # 3:  ERECTILE DYSFUNCTION, NON-ORGANIC (ICD-302.72) Pt was getting 30tab a month for ED, I refused to refill this quantitiy. I refilled 10tab a month.  His updated medication list for this problem includes:    Viagra 100 Mg Tabs (Sildenafil citrate) .[name]... Take one tablet one hour before sex  Problem # 4:  CIGARETTE SMOKER (ICD-305.1)  Encouraged smoking cessation.  Complete Medication List: 1)  Viagra 100 Mg Tabs (Sildenafil citrate) .... Take one tablet one hour before sex 2)  Neurontin 300 Mg Caps (Gabapentin) .... Take 1 tablet by mouth two times a day as needed for numbness 3)  Hydrochlorothiazide 25 Mg Tabs (Hydrochlorothiazide) .... One by mouth once daily 4)  Losartan Potassium 50 Mg Tabs (Losartan potassium) .... Take 1 tablet by mouth once a day 5)  Protonix 40 Mg Tbec (Pantoprazole sodium) .... Take one tablet daily. 6)  Ventolin Hfa 108 (90 Base) Mcg/act Aers (Albuterol sulfate) .... 2 puffs inhaled every 4-6 hrs as needed for cough 7)  Zyrtec Allergy 10 Mg Tabs (Cetirizine hcl) .... Take 1 tablet by mouth once a day 8)  Meclizine Hcl 12.5 Mg Tabs (Meclizine hcl) .... Take every 6 hours as needed for dizzyness. 9)  Zoloft 100 Mg Tabs (Sertraline hcl) .... One tablet daily 10)  Flomax 0.4 Mg Caps (Tamsulosin hcl) .... Take 1 tablet by mouth once a day 11)  Anti-hist 25 Mg Caps (Diphenhydramine hcl) .... Take 1 tablet by mouth two times a day as needed for itching.  Patient Instructions: 1)  Please schedule a follow-up appointment in 1 month. Prescriptions: VIAGRA 100 MG  TABS (SILDENAFIL CITRATE)  take one tablet one hour before sex  #10 x 1   Entered and Authorized by:   Darnelle Maffucci MD   Signed by:   Darnelle Maffucci MD on 03/19/2010   Method used:   Print then Give to Patient   RxID:   [id number] ANTI-HIST 25 MG CAPS (DIPHENHYDRAMINE HCL) Take 1 tablet by mouth two times a day as needed for itching.  #30 x 1   Entered and Authorized by:   Darnelle Maffucci MD   Signed by:  Darnelle Maffucci MD on 03/19/2010   Method used:   Print then Give to Patient   RxID:   [id number] LOSARTAN POTASSIUM 50 MG TABS (LOSARTAN POTASSIUM) Take 1 tablet by mouth once a day  #30 x 1   Entered and Authorized by:   Darnelle Maffucci MD   Signed by:   Darnelle Maffucci MD on 03/19/2010   Method used:   Print then Give to Patient   RxID:   [id number]    Prevention & Chronic Care Immunizations   Influenza vaccine: Not documented    Tetanus booster: 02/28/2009: Td    Pneumococcal vaccine: Not documented  Other Screening   PSA: 0.50  (08/02/2009)   Smoking status: current  (03/19/2010)   Smoking cessation counseling: yes  (03/19/2010)  Lipids   Total Cholesterol: Not documented   LDL: Not documented   LDL Direct: Not documented   HDL: Not documented   Triglycerides: Not documented      Resource handout printed.

## 2010-08-27 NOTE — Assessment & Plan Note (Signed)
Summary: ACUTE-BAD COUGH PER PT/(TOBBIA)/CFB   Vital Signs:  Patient profile:   50 year old male Height:      70 inches (177.80 cm) Weight:      213.0 pounds (94.91 kg) BMI:     30.07 Temp:     98.6 degrees F (37.00 degrees C) oral Pulse rate:   62 / minute BP sitting:   136 / 88  (right arm) Cuff size:   regular  Vitals Entered By: Theotis Barrio NT II (September 10, 2009 9:29 AM) CC: PRODUCTIVE COUGH AT TIMES FOR ABOUT 4-5 MONTHS   / BACK PAIN # 8.5,  Is Patient Diabetic? No Pain Assessment Patient in pain? yes     Location: back Intensity:       8.5 Type: sharp Onset of pain  Chronic Nutritional Status BMI of 25 - 29 = overweight  Have you ever been in a relationship where you felt threatened, hurt or afraid?No   Does patient need assistance? Functional Status Self care Ambulation Normal Comments CHRONIC BACK PAIN  /  PRODUCTIVE COUGH AT TIMES FOR ABOUT 4-5 MONTHS   Primary Care Provider:  Marinda Elk MD  CC:  PRODUCTIVE COUGH AT TIMES FOR ABOUT 4-5 MONTHS   / BACK PAIN # 8.5 and .  History of Present Illness: 50 yr old man with pmhx as described below comes to the clinic complaining of cough 4-38months. Patient smokes 1/2 pack a day. Has associated whitish phlegm. Denies fever.  Patient has chronic back pain. Patient had two surgeries last one was May 2010. Denies saddle anesthesia, bowel or bladder incontinence.   Depression History:      The patient denies a depressed mood most of the day and a diminished interest in his usual daily activities.         Preventive Screening-Counseling & Management  Alcohol-Tobacco     Alcohol drinks/day: 0     Alcohol type: beer /drinks at times     Smoking Status: current     Smoking Cessation Counseling: yes     Packs/Day: irradict smoker- 1 ppwk     Year Started: at the age of 28     Year Quit: 1/ 2009  Caffeine-Diet-Exercise     Caffeine use/day: no     Does Patient Exercise: no  Problems Prior to  Update: 1)  Diarrhea  (ICD-787.91) 2)  Urinary Urgency  (ICD-788.63) 3)  Dizziness  (ICD-780.4) 4)  Palpitations  (ICD-785.1) 5)  Cigarette Smoker  (ICD-305.1) 6)  ? 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 Take One Tablet One Hour Before Sex 2)  Neurontin 300 Mg Caps (Gabapentin) .... Take 1 Tablet By Mouth Two Times A Day As Needed For Numbness 3)  Hydrochlorothiazide 25 Mg Tabs (Hydrochlorothiazide) .... One By Mouth Once Daily 4)  Lisinopril 10  Mg Tabs (Lisinopril) .... Take 1 Tablet By Mouth Once A Day 5)  Protonix 40 Mg Tbec (Pantoprazole Sodium) .... Take One Tablet Daily. 6)  Ventolin Hfa 108 (90 Base) Mcg/act Aers (Albuterol Sulfate) .... 2 Puffs Inhaled Every 4-6 Hrs As Needed For Cough 7)  Zyrtec Allergy 10 Mg Tabs (Cetirizine Hcl) .... Take 1 Tablet By Mouth Once A Day 8)  Meclizine Hcl 12.5 Mg Tabs (Meclizine Hcl) .... Take Every 6 Hours As Needed For Dizzyness. 9)  Zoloft 100 Mg Tabs (Sertraline Hcl) .... One Tablet Daily  Current Medications (verified): 1)  Viagra 100 Mg  Tabs (Sildenafil Citrate) .... Take One Tablet One Hour Before Sex 2)  Neurontin 300 Mg Caps (Gabapentin) .... Take 1 Tablet By Mouth Two Times A Day As Needed For Numbness 3)  Hydrochlorothiazide 25 Mg Tabs  (Hydrochlorothiazide) .... One By Mouth Once Daily 4)  Lisinopril 10 Mg Tabs (Lisinopril) .... Take 1 Tablet By Mouth Once A Day 5)  Protonix 40 Mg Tbec (Pantoprazole Sodium) .... Take One Tablet Daily. 6)  Ventolin Hfa 108 (90 Base) Mcg/act Aers (Albuterol Sulfate) .... 2 Puffs Inhaled Every 4-6 Hrs As Needed For Cough 7)  Zyrtec Allergy 10 Mg Tabs (Cetirizine Hcl) .... Take 1 Tablet By Mouth Once A Day 8)  Meclizine Hcl 12.5 Mg Tabs (Meclizine Hcl) .... Take Every 6 Hours As Needed For Dizzyness. 9)  Zoloft 100 Mg Tabs (Sertraline Hcl) .... One Tablet Daily  Allergies: 1)  ! Vicodin  Directives: 1)  No Narcotics   Past History:  Past Medical History: Last updated: 12/05/2008 Back Pain with radiculopathy, s/p fusion L4-L5 and s/p rexploration L4-L5 PUD PSA, forged [facility] surgery prescription, fired.  Family History: Last updated: 02/24/2007 No family history of back pain  Social History: Last updated: 02/28/2009 The patient lives in [place] He is disabled He has a 10 pack year history of smoking He abuses cocaine, though he states he has been clean for a while as of 11/09 He last drank alcohol one year ago  Risk Factors: Alcohol Use: 0 (09/10/2009) Caffeine Use: no (09/10/2009) Exercise: no (09/10/2009)  Risk Factors: Smoking Status: current (09/10/2009) Packs/Day: irradict smoker- 1 ppwk (09/10/2009)  Family History: Reviewed history from 02/24/2007 and no changes required. No family history of back pain  Social History: Reviewed history from 02/28/2009 and no changes required. The patient lives in [place] He is disabled He has a 10 pack year history of smoking He abuses cocaine, though he states he has been clean for a while as of 11/09 He last drank alcohol one year ago  Review of Systems       The patient complains of prolonged cough, muscle weakness, and difficulty walking.  The patient denies fever, chest pain, dyspnea on exertion, peripheral  edema, headaches, hemoptysis, abdominal pain, melena, hematochezia, and hematuria.    Physical Exam  General:  alert and well-developed.   Mouth:  good dentition and pharynx pink and moist.   Neck:  supple, full ROM, and no masses.   Lungs:  normal respiratory effort and normal breath sounds.   Heart:  normal rate, regular rhythm, and no murmur.   Abdomen:  soft, non-tender, and normal bowel sounds.   Msk:  no joint swelling, no joint warmth, and no redness over joints.    Extremities:  no edema Neurologic:  alert & oriented X3 and cranial nerves II-XII intact.   Psych:  Oriented X3, memory intact for recent and remote, normally interactive, and good eye  contact.     Impression & Recommendations:  Problem # 1:  BACK PAIN (ICD-724.5) Chronic. No saddle anesthesia, bowel or bladder incontinence. Will referr to [facility] for further management. Instructed to continue physical therapy  Orders: Pain Clinic Referral (Pain)  Problem # 2:  NARCOTIC ABUSE (ICD-305.90) Patient refused UDS.   Problem # 3:  GERD (ICD-530.81) Instructed to stop omeprazole and start protonix as directed.   His updated medication list for this problem includes:    Protonix 40 Mg Tbec (Pantoprazole sodium) .[name]... Take one tablet daily.  Problem # 4:  URINARY URGENCY (ICD-788.63) Start flomax and reasses.  Problem # 5:  CIGARETTE SMOKER (ICD-305.1) Encouraged smoking cessation.  Complete Medication List: 1)  Viagra 100 Mg Tabs (Sildenafil citrate) .... Take one tablet one hour before sex 2)  Neurontin 300 Mg Caps (Gabapentin) .... Take 1 tablet by mouth two times a day as needed for numbness 3)  Hydrochlorothiazide 25 Mg Tabs (Hydrochlorothiazide) .... One by mouth once daily 4)  Lisinopril 10 Mg Tabs (Lisinopril) .... Take 1 tablet by mouth once a day 5)  Protonix 40 Mg Tbec (Pantoprazole sodium) .... Take one tablet daily. 6)  Ventolin Hfa 108 (90 Base) Mcg/act Aers (Albuterol sulfate) .... 2  puffs inhaled every 4-6 hrs as needed for cough 7)  Zyrtec Allergy 10 Mg Tabs (Cetirizine hcl) .... Take 1 tablet by mouth once a day 8)  Meclizine Hcl 12.5 Mg Tabs (Meclizine hcl) .... Take every 6 hours as needed for dizzyness. 9)  Zoloft 100 Mg Tabs (Sertraline hcl) .... One tablet daily 10)  Flomax 0.4 Mg Caps (Tamsulosin hcl) .... Take 1 tablet by mouth once a day  Patient Instructions: 1)  Please schedule a follow-up appointment in 1 month. 2)  Start taking Flomax. 3)  Continue to go to Physical therapy. 4)  Take all medication as prescribed. 5)  You will be called with any abnormalities in the tests scheduled or performed today.  If you don't hear from [place] within a week from when the test was performed, you can assume that your test was normal.  Prescriptions: FLOMAX 0.4 MG CAPS (TAMSULOSIN HCL) Take 1 tablet by mouth once a day  #30 x 1   Entered and Authorized by:   Laren Everts MD   Signed by:   Laren Everts MD on 09/10/2009   Method used:   Electronically to        [facility] [phone] (retail)       [address]       [place], [place]  [address]       Ph: [phone]       Fax: [phone]   RxID:   [id number]    Prevention & Chronic Care Immunizations   Influenza vaccine: Not documented    Tetanus booster: 02/28/2009: Td    Pneumococcal vaccine: Not documented  Other Screening   PSA: 0.50  (08/02/2009)   Smoking status: current  (09/10/2009)   Smoking cessation counseling: yes  (09/10/2009)  Lipids   Total Cholesterol: Not documented   LDL: Not documented   LDL Direct: Not documented   HDL: Not documented   Triglycerides: Not documented

## 2010-08-27 NOTE — Miscellaneous (Signed)
[facility]   Im[facility] By: Margie Billet 11/12/2009 15:46:38  _____________________________________________________________________  External Attachment:    Type:   Image     Comment:   External Document

## 2010-08-27 NOTE — Consult Note (Signed)
Summary: [facility]: Dr. Joette Catching [facility]: Dr. Ophelia Charter   Imported By: Florinda Marker 05/13/2007 13:57:41  _____________________________________________________________________  External Attachment:    Type:   Image     Comment:   External Document

## 2010-08-27 NOTE — Miscellaneous (Signed)
Summary: [facility]   Imported By: Florinda Marker 10/03/2009 15:57:36  _____________________________________________________________________  External Attachment:    Type:   Image     Comment:   External Document

## 2010-08-27 NOTE — Assessment & Plan Note (Signed)
Summary: ACUTE-POSSIBLE STD/CAN'T CONTROL URINE/(POKHAREL)/CFB   Vital Signs:  Patient profile:   50 year old male Height:      70 inches (177.80 cm) Weight:      211.8 pounds (96.27 kg) BMI:     30.50 O2 Sat:      97 % on Room air Temp:     98.1 degrees F (36.72 degrees C) oral Pulse rate:   81 / minute BP sitting:   138 / 86  (right arm)  Vitals Entered By: Stanton Kidney Ditzler RN (July 17, 2009 1:51 PM)  O2 Flow:  Room air Is Patient Diabetic? No Pain Assessment Patient in pain? yes     Location: back Intensity: 7 Onset of pain  years Nutritional Status BMI of > 30 = obese Nutritional Status Detail appetite normal  Have you ever been in a relationship where you felt threatened, hurt or afraid?denies   Does patient need assistance? Functional Status Self care Ambulation Normal Comments ? STD and past 3-4 months sore throat and yellow productive cough.   Primary Care Provider:  Marinda Elk MD   History of Present Illness: This is a 50 year old man with past medical history of chronic back pain (s/p surgery), PUD, PSA (cocaine and forged a prescription from another physicians office) and GERD who is here for check up.  He is concerned that he may have an STD and he complains of a non-productive cough for 2-3 months.  Concerned that he has contracted a UTI.  Having trouble with incontinence, also with dysuria.  No discharge. The woman he has been with is having discharge.  He has not had any skin lesions.  No leg weakness or bowel incontinence.  Chronic back pain is at baseline.  Gets dizzy when ever he looks to the right, sits up quickly, or changes directions.  Sore throat for 2-3 months.  Also with cough.  Has "stomach pills" and an inhaler which do not help.  cough is worst when laying down, sometimes wakes him from sleep.  Was hurting to swallow, not as much any more.  Pain worse in the morning, doesn't get much better through out the day.  Has uncontrolable  coughing spells.  No wheesing. Sometimes feels his chest is tight, a little trouble breathing.  Smokes 1/2 ppd.  No fevers, sweats or chills.  Appetite is OK.    Some diarrhea, going 5-6 times a day, no blood, for about a month. No abd pain.   Drinks a beer now and then, but doesn't drink much because of stomach pain.      Depression History:      The patient denies a depressed mood most of 50 and a diminished interest in his usual daily activities.         Preventive Screening-Counseling & Management  Alcohol-Tobacco     Alcohol drinks/day: 0     Alcohol type: beer /drinks at times     Smoking Status: quit     Smoking Cessation Counseling: yes     Packs/Day: irradict smoker     Year Started: at the age of 50     Year Quit: 1/ 2009  Caffeine-Diet-Exercise     Caffeine use/day: no     Does Patient Exercise: no     Type of exercise: PT     Times/week: 2-3  Medications Prior to Update: 1)  Viagra 100 Mg  Tabs (Sildenafil Citrate) .... Take One Tablet One Hour Before Sex 2)  Neurontin 300 Mg Caps (Gabapentin) .... Take 1 Tablet By Mouth Two Times A Day As Needed For Numbness 3)  Hydrochlorothiazide 25 Mg Tabs (Hydrochlorothiazide) .... One By Mouth Once Daily 4)  Lisinopril 10 Mg Tabs (Lisinopril) .... Take 1 Tablet By Mouth Once A Day 5)  Omeprazole 40 Mg Cpdr (Omeprazole) .... Take 1 Tablet By Mouth Once A Day 6)  Ventolin Hfa 108 (90 Base) Mcg/act Aers (Albuterol Sulfate) .... 2 Puffs Inhaled Every 4-6 Hrs As Needed For Cough 7)  Zyrtec Allergy 10 Mg Tabs (Cetirizine Hcl) .... Take 1 Tablet By Mouth Once A Day 8)  Guaifenesin Ac 100-10 Mg/10ml Syrp (Guaifenesin-Codeine) .... Take 5ml By Mouth Q8hours As Needed For Cough  Allergies: 1)  ! Vicodin  Social History: Smoking Status:  quit  Review of Systems       per hpi  Physical Exam  General:  alert and well-developed.   Head:  normocephalic and atraumatic.   Eyes:  vision grossly intact, pupils equal, pupils  round, pupils reactive to light, and nystagmus on far rightward gaze.  Looking right is very uncomfortable.   Nose:  no external deformity, nasal dischargemucosal pallor, and mucosal erythema.   Mouth:  good dentition, pharynx pink and moist, no erythema, no exudates, no posterior lymphoid hypertrophy, and no lesions.   Neck:  supple, full ROM, and no masses.   Lungs:  normal respiratory effort and R base crackles.  otherwise clear with good air movment. has a dry non productive cough with deep respiration. Heart:  normal rate, regular rhythm, and no murmur.   Abdomen:  soft, non-tender, and normal bowel sounds.   Genitalia:  circumcised, no hydrocele, no varicocele, no scrotal masses, no cutaneous lesions, and no urethral discharge.   Pulses:  2+ Extremities:  no edema Neurologic:  alert & oriented X3 and cranial nerves II-XII intact.   Skin:  no suspicious lesions.   Cervical Nodes:  no posterior cervical adenopathy.   Psych:  Oriented X3, memory intact for recent and remote, normally interactive, and good eye contact.     Impression & Recommendations:  Problem # 1:  SEXUALLY TRANSMITTED DISEASE, EXPOSURE TO (ICD-V01.6) No lesions or discharge on exam.  Will get urine for UA, gc and chlamydia.  Will chech HIV. Incontinence is concerning, especially with history of low back surgery and chronic back pain.  Will need to consider a spinal lesion (cauda equina) if this symptom does not resolve.  Orders: T-Urinalysis Dipstick only ([id number]) T-Chlamydia & GC Probe, Urine ([id number]) T-HIV Antibody  (Reflex) ([id number])  Problem # 2:  COUGH (ICD-786.2) Still smoking, which I suspect is contributing to cough.  Non productive, no hemoptysis.  There are  fine crackles in the right lung base.  PPI has helped a little, he has an inhaler which helps (suspect this is albuterol), and zytrec has not changed the cough.  He does have some nasal erythema/mucoal palor so allergies are still  considered as is dry living conditions.  PFT's from earlier this year indicated moderate obstructive disease, no reversibility.  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  Will need to discuss quitting smoking.  Problem # 3:  DIZZINESS (ICD-780.4) symptoms occur with right ward gaze.  exam elicits nystagmus with rightward gaze.  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  No other neurologic symptoms.  I made it clear that I am concerned about this symptom and if meclizine does not work i would like to see him again to  work up with cT head.  His updated medication list for this problem includes:    Zyrtec Allergy 10 Mg Tabs (Cetirizine hcl) .[name]... Take 1 tablet by mouth once a day    Meclizine Hcl 12.5 Mg Tabs (Meclizine hcl) .[name]... Take every 6 hours as needed for dizzyness.  Complete Medication List: 1)  Viagra 100 Mg Tabs (Sildenafil citrate) .... Take one tablet one hour before sex 2)  Neurontin 300 Mg Caps (Gabapentin) .... Take 1 tablet by mouth two times a day as needed for numbness 3)  Hydrochlorothiazide 25 Mg Tabs (Hydrochlorothiazide) .... One by mouth once daily 4)  Lisinopril 10 Mg Tabs (Lisinopril) .... Take 1 tablet by mouth once a day 5)  Omeprazole 40 Mg Cpdr (Omeprazole) .... Take 1 tablet by mouth once a day 6)  Ventolin Hfa 108 (90 Base) Mcg/act Aers (Albuterol sulfate) .... 2 puffs inhaled every 4-6 hrs as needed for cough 7)  Zyrtec Allergy 10 Mg Tabs (Cetirizine hcl) .... Take 1 tablet by mouth once a day 8)  Meclizine Hcl 12.5 Mg Tabs (Meclizine hcl) .... Take every 6 hours as needed for dizzyness. 9)  Zoloft 100 Mg Tabs (Sertraline hcl) .... One tablet daily  Patient Instructions: 1)  Please schedule a follow-up appointment in 2 weeks. 2)  You had labwork done today, we will call you if there is anything that needs to be addressed before your appointment. 3)  Please try the meclizine for dizzyness. Prescriptions: MECLIZINE HCL 12.5 MG TABS (MECLIZINE HCL)  Take every 6 hours as needed for dizzyness.  #20 x 0   Entered and Authorized by:   Elby Showers MD   Signed by:   Elby Showers MD on 07/17/2009   Method used:   Electronically to        [facility] [phone]* (retail)       [address]       [place], [place]  [address]       Ph: [phone]       Fax: [phone]   RxID:   [id number]  Process Orders Check Orders Results:     [facility]: ABN not required for this insurance Tests Sent for requisitioning (July 17, 2009 8:16 PM):     07/17/2009: [facility] -- T-Chlamydia & GC Probe, Urine [[id number]] (signed)     07/17/2009: [facility] -- T-HIV Antibody  (Reflex) [[id number]] (signed)    Prevention & Chronic Care Immunizations   Influenza vaccine: Not documented    Tetanus booster: 02/28/2009: Td    Pneumococcal vaccine: Not documented  Other Screening   PSA: Not documented   Smoking status: quit  (07/17/2009)  Lipids   Total Cholesterol: Not documented   LDL: Not documented   LDL Direct: Not documented   HDL: Not documented   Triglycerides: Not documented  Laboratory Results   Urine Tests  Date/Time Received: .Krystal Eaton [facility])  July 17, 2009 2:45 PM  Date/Time Reported: Krystal Eaton [facility])  July 17, 2009 2:45 PM   Routine Urinalysis   Color: yellow Appearance: Clear Glucose: negative   (Normal Range: Negative) Bilirubin: negative   (Normal Range: Negative) Ketone: negative   (Normal Range: Negative) Spec. Gravity: >=1.030   (Normal Range: 1.003-1.035) Blood: negative   (Normal Range: Negative) pH: 5.0   (Normal Range: 5.0-8.0) Protein: 30   (Normal Range: Negative) Urobilinogen: 0.2   (Normal Range: 0-1) Nitrite: negative   (Normal Range: Negative) Leukocyte Esterace: negative   (Normal Range:  Negative)

## 2010-08-27 NOTE — Assessment & Plan Note (Signed)
Summary: PER HELEN ABOUT DRUG TEST./ SB.   Vital Signs:  Patient Profile:   50 Years Old Male Height:     69 inches (175.26 cm) Weight:      181.3 pounds (82.41 kg) BMI:     26.87 Temp:     99.8 degrees F (37.67 degrees C) oral BP sitting:   157 / 100  (right arm) Cuff size:   regular  Pt. in pain?   yes    Location:   RIGHT LET    Intensity:   10+    Type:       burning  Vitals Entered By: Theotis Barrio (March 12, 2007 2:04 PM)              Is Patient Diabetic? No Nutritional Status NORMAL  Does patient need assistance? Functional Status Self care Ambulation Normal Comments PATIENT BP IS VERY HIGH/ PATIENT STATES HE IS NOT ON ANY BP MEDICATION.   PCP:  Peggye Pitt  Chief Complaint:  RIGHT LEG AND LOWER BACK PAIN / MEDICATION REFILL.  History of Present Illness: 50 y/o with PMH of back pain.that comes in to the Clin[facility] b/ he lost his medication last week.  He is still having back pain, he has been evaluated for this in the past, his MRI showed disk herniation. He relates that he has been taking ibuprofen that alliviates the pain, but vicodin is much more effective.   Pt will not get vicodin at this time, his UDS was tested and showed + for cocaine.  He comes in to see if there is anything else he could get.   Current Allergies: No known allergies     Risk Factors: Tobacco use:  current    Year started:  at the age of 50    Cigarettes:  Yes -- 1/3 pack(s) per day Alcohol use:  yes    Type:  beer /drinks at times Exercise:  no Seatbelt use:  at times %   Review of Systems  The patient denies anorexia, fever, weight loss, vision loss, decreased hearing, hoarseness, chest pain, syncope, peripheral edema, prolonged cough, and abdominal pain.     Physical Exam  General:     Well-developed,well-nourished,in no acute distress; alert,appropriate and cooperative throughout examination    Impression & Recommendations:  Problem # 1:  BACK PAIN  (ICD-724.5) Assessment: Unchanged Pt was on a pain contract lost his pain medication (vicodin) 90 pills, we tested his urine and it was + for cocaine.He is not elegible for narcotics by the contract we have agreed on.  I will change him to tramadol. His appointment with his neurosurgeon is on october. Pt was only given ultram 50mg  plus ibuprofen and will refer him  to the pain clinic. His updated medication list for this problem includes:    Ibuprofen 800 Mg Tabs (Ibuprofen) .[name]... Take 1 tablet every 6 hours as needed for pain.    Ultram 50 Mg Tabs (Tramadol hcl) .[name]... Take 2 tablet by mouth three times a day  Orders: Pain Clinic Referral (Pain)   Complete Medication List: 1)  Ibuprofen 800 Mg Tabs (Ibuprofen) .... Take 1 tablet every 6 hours as needed for pain. 2)  Omeprazole 20 Mg Cpdr (Omeprazole) .... Take 1 tablet by mouth once a day 3)  Ultram 50 Mg Tabs (Tramadol hcl) .... Take 2 tablet by mouth three times a day   Patient Instructions: 1)  Please schedule a follow-up appointment in 3 months. 2)  Take your medication as  indicated.    Prescriptions: ULTRAM 50 MG  TABS (TRAMADOL HCL) Take 2 tablet by mouth three times a day  #120 x 0   Entered and Authorized by:   Marinda Elk MD   Signed by:   Marinda Elk MD on 03/12/2007   Method used:   Print then Give to Patient   RxID:   [id number] IBUPROFEN 800 MG  TABS (IBUPROFEN) Take 1 tablet every 6 hours as needed for pain.  #120 x 2   Entered and Authorized by:   Marinda Elk MD   Signed by:   Marinda Elk MD on 03/12/2007   Method used:   Print then Give to Patient   RxID:   [id number] OMEPRAZOLE 20 MG  CPDR (OMEPRAZOLE) Take 1 tablet by mouth once a day  #31 x 3   Entered and Authorized by:   Marinda Elk MD   Signed by:   Marinda Elk MD on 03/12/2007   Method used:   Print then Give to Patient   RxID:   [id number] IRSWNI ER 100 MG  TB24 (TRAMADOL HCL) Take 1  tablet by mouth four times a day  #92 x 0   Entered and Authorized by:   Marinda Elk MD   Signed by:   Marinda Elk MD on 03/12/2007   Method used:   Print then Give to Patient   RxID:   [id number]

## 2010-08-27 NOTE — Consult Note (Signed)
Summary: [facility]: D/Charge Summary  [facility]: D/Charge Summary   Imported By: Florinda Marker 11/23/2008 14:34:22  _____________________________________________________________________  External Attachment:    Type:   Image     Comment:   External Document

## 2010-08-27 NOTE — Assessment & Plan Note (Signed)
Summary: 2WK /F/U/TOBBIA/VS   Vital Signs:  Patient profile:   50 year old male Height:      70 inches (177.80 cm) Weight:      208.8 pounds (94.91 kg) BMI:     30.07 Temp:     97.9 degrees F (36.61 degrees C) oral Pulse rate:   80 / minute BP sitting:   137 / 91  (right arm)  Vitals Entered By: Stanton Kidney Ditzler RN (August 01, 2009 10:49 AM) Is Patient Diabetic? No Pain Assessment Patient in pain? yes     Location: back Intensity: 7 Onset of pain  years Nutritional Status BMI of > 30 = obese Nutritional Status Detail appetite good  Have you ever been in a relationship where you felt threatened, hurt or afraid?denies   Does patient need assistance? Functional Status Self care Ambulation Normal Comments FU - cont with nonproductive cough for past 3-4 months. Refills on meds.   Primary Care Provider:  Marinda Elk MD   History of Present Illness: This is a  year old man with past medical history of back pain, PUD, polysubstance abuse.  Here for 2 week follow up after visit with urniary incontinence, idzzyness, cough and diarrhea.  1) no nocturia, some straining, urgency no frank incontinence, no dysuria  2) diarrhea: 5-6 times a day, watery, sometimes sees red in the stool- but he drinks a lot of red cool-aid, lots of gas and bloating. stomach burns- feels better with food, has a history of ulcers in the 80's, sometimes sees dark black stools.  no weakness, presyncope, palpatations.  3) dizzyness with right ward gaze.  comes and goes.  has not tried meclizine recomended at last appointment.  4) has a sore spot on the left temple.  5) cough keeps him up at night.  this has been persistant for 3-4 months.  non productive, no sputum no hemoptysis, no fevers or chills.  Has had PFT's showing slight obstructive process. continues to smoke.  Depression History:      The patient denies a depressed mood most of the day and a diminished interest in his usual daily  activities.         Preventive Screening-Counseling & Management  Alcohol-Tobacco     Alcohol drinks/day: 0     Alcohol type: beer /drinks at times     Smoking Status: current     Smoking Cessation Counseling: yes     Packs/Day: irradict smoker- 1 ppwk     Year Started: at the age of 49     Year Quit: 1/ 2009  Caffeine-Diet-Exercise     Caffeine use/day: no     Does Patient Exercise: no     Type of exercise: PT     Times/week: 2-3  Current Medications (verified): 1)  Viagra 100 Mg  Tabs (Sildenafil Citrate) .... Take One Tablet One Hour Before Sex 2)  Neurontin 300 Mg Caps (Gabapentin) .... Take 1 Tablet By Mouth Two Times A Day As Needed For Numbness 3)  Hydrochlorothiazide 25 Mg Tabs (Hydrochlorothiazide) .... One By Mouth Once Daily 4)  Lisinopril 10 Mg Tabs (Lisinopril) .... Take 1 Tablet By Mouth Once A Day 5)  Omeprazole 40 Mg Cpdr (Omeprazole) .... Take 1 Tablet By Mouth Once A Day 6)  Ventolin Hfa 108 (90 Base) Mcg/act Aers (Albuterol Sulfate) .... 2 Puffs Inhaled Every 4-6 Hrs As Needed For Cough 7)  Zyrtec Allergy 10 Mg Tabs (Cetirizine Hcl) .... Take 1 Tablet By Mouth Once A  Day 8)  Meclizine Hcl 12.5 Mg Tabs (Meclizine Hcl) .... Take Every 6 Hours As Needed For Dizzyness. 9)  Zoloft 100 Mg Tabs (Sertraline Hcl) .... One Tablet Daily  Allergies: 1)  ! Vicodin  Social History: Smoking Status:  current Packs/Day:  irradict smoker- 1 ppwk  Review of Systems       per hpi  Physical Exam  General:  alert and well-developed.   Head:  normocephalic and atraumatic.   Mouth:  good dentition and pharynx pink and moist.   Lungs:  normal respiratory effort and normal breath sounds.   Heart:  normal rate, regular rhythm, and no murmur.   Abdomen:  soft, non-tender, and normal bowel sounds.   Rectal:  no external abnormalities, no hemorrhoids, normal sphincter tone, no masses, and no tenderness.   Prostate:  no gland enlargement.   Psych:  Oriented X3, memory intact  for recent and remote, normally interactive, and good eye contact.     Impression & Recommendations:  Problem # 1:  GERD (ICD-530.81) has been taking omeprazole with no effect.  many of his symptoms are likely related to uncontroled GERD.  Will try protonix.  Will check CBC, CMET, FOBT, stool for ova/parasites/culture and lipase for ttp in egastric area and complaint of diarrhea and possible melena.  Will consider gi consult if these results are negative.  His updated medication list for this problem includes:    Protonix 40 Mg Tbec (Pantoprazole sodium) .[name]... Take one tablet daily.  Orders: T-CBC w/Diff [id number]) T-Comprehensive Metabolic Panel [id number]) T-Lipase [id number])  Problem # 2:  COUGH (ICD-786.2)  most likely GERD and smoking related. change omeprazole to protonix rec quit smoking. will get CXR   Orders: CXR- 2view (CXR)  Problem # 3:  URINARY URGENCY (VHQ-469.62) lower urinary tract symptoms cw BPH in [place].  Will check PSA.  Could try flomax if PSA is negative.  low threshold for urology consult especially with ED as well.  Orders: T-PSA ([id number])  Complete Medication List: 1)  Viagra 100 Mg Tabs (Sildenafil citrate) .... Take one tablet one hour before sex 2)  Neurontin 300 Mg Caps (Gabapentin) .... Take 1 tablet by mouth two times a day as needed for numbness 3)  Hydrochlorothiazide 25 Mg Tabs (Hydrochlorothiazide) .... One by mouth once daily 4)  Lisinopril 10 Mg Tabs (Lisinopril) .... Take 1 tablet by mouth once a day 5)  Protonix 40 Mg Tbec (Pantoprazole sodium) .... Take one tablet daily. 6)  Ventolin Hfa 108 (90 Base) Mcg/act Aers (Albuterol sulfate) .... 2 puffs inhaled every 4-6 hrs as needed for cough 7)  Zyrtec Allergy 10 Mg Tabs (Cetirizine hcl) .... Take 1 tablet by mouth once a day 8)  Meclizine Hcl 12.5 Mg Tabs (Meclizine hcl) .... Take every 6 hours as needed for dizzyness. 9)  Zoloft 100 Mg Tabs (Sertraline hcl) .... One tablet  daily  Other Orders: T- * Misc. Laboratory test [id number])  Patient Instructions: 1)  You had lab work done today.  We will call you if there is anything that needs to be addressed before your next appointment. 2)  Please schedule a follow-up appointment in 1 month. Prescriptions: MECLIZINE HCL 12.5 MG TABS (MECLIZINE HCL) Take every 6 hours as needed for dizzyness.  #20 x 0   Entered and Authorized by:   Elby Showers MD   Signed by:   Elby Showers MD on 08/01/2009   Method used:   Print then Give to Patient   RxID:  [id number] VIAGRA 100 MG  TABS (SILDENAFIL CITRATE) take one tablet one hour before sex  #30 x 6   Entered and Authorized by:   Elby Showers MD   Signed by:   Elby Showers MD on 08/01/2009   Method used:   Print then Give to Patient   RxID:   [id number] MECLIZINE HCL 12.5 MG TABS (MECLIZINE HCL) Take every 6 hours as needed for dizzyness.  #20 x 0   Entered and Authorized by:   Elby Showers MD   Signed by:   Elby Showers MD on 08/01/2009   Method used:   Electronically to        [facility] [phone]* (retail)       [address]       [place], [place]  [address]       Ph: [phone]       Fax: [phone]   RxID:   [id number] VIAGRA 100 MG  TABS (SILDENAFIL CITRATE) take one tablet one hour before sex  #30 x 6   Entered and Authorized by:   Elby Showers MD   Signed by:   Elby Showers MD on 08/01/2009   Method used:   Electronically to        [facility] [phone]* (retail)       [address]       [place], [place]  [address]       Ph: [phone]       Fax: [phone]   RxID:   [id number] PROTONIX 40 MG TBEC (PANTOPRAZOLE SODIUM) Take one tablet daily.  #32 x 3   Entered and Authorized by:   Elby Showers MD   Signed by:   Elby Showers MD on 08/01/2009   Method used:   Print then Give to Patient   RxID:   [id number]  Process Orders Check Orders Results:     [facility]: ABN  not required for this insurance Tests Sent for requisitioning (August 01, 2009 2:22 PM):     08/01/2009: [facility] -- T-CBC w/Diff [[id number]] (signed)     08/01/2009: [facility] -- T-Comprehensive Metabolic Panel [[id number]] (signed)     08/01/2009: [facility] -- T-PSA [id number] (signed)     08/01/2009: [facility] -- T- * Misc. Laboratory test [[id number]] (signed)     08/01/2009: [facility] -- T-Lipase [id number] (signed)    Prevention & Chronic Care Immunizations   Influenza vaccine: Not documented    Tetanus booster: 02/28/2009: Td    Pneumococcal vaccine: Not documented  Other Screening   PSA: Not documented   PSA ordered.   Smoking status: current  (08/01/2009)   Smoking cessation counseling: yes  (08/01/2009)  Lipids   Total Cholesterol: Not documented   LDL: Not documented   LDL Direct: Not documented   HDL: Not documented   Triglycerides: Not documented

## 2010-08-27 NOTE — Progress Notes (Signed)
Summary: phone/gg  Phone Note Call from Patient   Caller: Patient Complaint: Chest Pain Summary of Call: Pt called with c/o cough.  He has been seen for this in the past, he was given a Rx for reflux but it's not helping.  He can't sleep and needs something done.  can you call pt or does he need to come in for an appointment? Pt # [id number] Initial call taken by: Merrie Roof RN,  February 22, 2009 4:29 PM  Follow-up for Phone Call        message left for pt to call clinic and make appointment for OV Follow-up by: Merrie Roof RN,  February 26, 2009 9:52 AM    He needs to be reviewed in the clinic.
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
## 2010-08-27 NOTE — Assessment & Plan Note (Signed)
Summary: CHECKUP/SB.   Vital Signs:  Patient profile:   50 year old male Height:      69 inches (175.26 cm) Weight:      199.05 pounds (90.48 kg) BMI:     29.50 Temp:     97.7 degrees F (36.50 degrees C) oral Pulse rate:   77 / minute BP sitting:   140 / 78  (left arm)  Vitals Entered By: Angelina Ok RN (February 09, 2009 2:12 PM) Is Patient Diabetic? No Pain Assessment Patient in pain? yes     Location: back, legs Intensity: 8 Type: aching Onset of pain  Constant Nutritional Status BMI of 25 - 29 = overweight  Have you ever been in a relationship where you felt threatened, hurt or afraid?No   Does patient need assistance? Functional Status Self care Ambulation Normal Comments Needspain medicine. Lump in chest ? STD  Having pain groin area.   Primary Care Provider:  Marinda Elk MD   History of Present Illness: Jerome Adams is a 50 year old Male with  degenerative spine s/p lumbar spinal surgery in [place], HTN. Complaints today:  1. Lump in left breast: painful lump in the left breast going on for a few weeks. He thinks there could be a small swelling there. Pain is improving on its own, swelling not worsening. Right breast is normal. No other lumps / bumps. Has family history of breast cancer in his sister.  2. Painful erection: had unprotected heterosexual encounter few days ago. No has pain on erection. No swelling, ulcer, discharge, dysuria, fever, rashes, joint pain.   3. Chronic cough: has had sore throat associated with dry cough for a long time. He was treated as GERD on earlier visits. No  dysphagia, or problem with speech or breathing. He thinks omeprazole helped some but not fully. Denies any post nasal drip, asthma. He is on lisinopril (currently not taking), he has taken them for a long time without any problem. He is an ex smoker.   4. Back pain: has constant back pain. Wants pain medicines filled. He is noted as having "abused narcotic" per Dr.  Louanne Belton note from Feb 2010 (multiple prescriber). Was given tramadol, not helping.    Preventive Screening-Counseling & Management  Alcohol-Tobacco     Alcohol type: beer /drinks at times     Smoking Status: quit > 6 months     Smoking Cessation Counseling: yes     Packs/Day: 1 pack per week     Year Started: at the age of 66     Year Quit: 1/ 2009  Comments: Stopped smoking this January.  Medications Prior to Update: 1)  Viagra 100 Mg  Tabs (Sildenafil Citrate) .... Take One Tablet One Hour Before Sex 2)  Lidoderm 5 % Ptch (Lidocaine) .... Once Daily Once A Day For 12 Hrs Keep 12hr Off. 3)  Neurontin 300 Mg Caps (Gabapentin) .... Take 1 Tablet By Mouth Two Times A Day As Needed For Numbness 4)  Hydrochlorothiazide 25 Mg Tabs (Hydrochlorothiazide) .... One By Mouth Once Daily 5)  Lisinopril 10 Mg Tabs (Lisinopril) .... Take 1 Tablet By Mouth Once A Day 6)  Tramadol Hcl 50 Mg Tabs (Tramadol Hcl) .... Take 1 Tablet Every 12 Hour For Pain As Needed. 7)  [facility] Omeprazole 20 Mg Tbec (Omeprazole) .... Take 1 Tablet By Mouth Once A Day  Allergies: 1)  ! Vicodin  Past History:  Past Medical History: Last updated: 12/05/2008 Back Pain with radiculopathy, s/p fusion  L4-L5 and s/p rexploration L4-L5 PUD PSA, forged vanguard surgery prescription, fired.  Family History: Last updated: 02/24/2007 No family history of back pain  Social History: Last updated: 06/01/2008 The patient lives in [place] He is disabled He has a 10 pack year history of smoking  He abuses cocaine, though he states he has been clean for a while as of 11/09 He last drank alcohol one year ago  Risk Factors: Exercise: yes (12/13/2008)  Risk Factors: Smoking Status: quit > 6 months (02/09/2009) Packs/Day: 1 pack per week (02/09/2009)  Social History: Smoking Status:  quit > 6 months  Review of Systems      See HPI  Physical Exam  General:  Well-developed,well-nourished,in no acute distress;  alert,appropriate and cooperative throughout examination Head:  normocephalic, atraumatic, and no abnormalities observed.   Eyes:  vision grossly intact, pupils equal, pupils round, and pupils reactive to light.  anicteric Ears:  clear no erythema or bulging membrane. Mobile tympanic membrane. Nose:  no external deformity.   Mouth:  pharynx pink and moist, mild erythema, no exudate or swelling noted.  Neck:  no JVD.   Breasts:  Right breast normal. Left breast: appears more lumpy than right and tender over the nipple on 1 o'clock position. But no definite mass palpable.  Lungs:  Normal respiratory effort, chest expands symmetrically. Lungs are clear to auscultation, no crackles or wheezes. Heart:  normal rate and regular rhythm.   Abdomen:  soft and non-tender.   Genitalia:  no scrotal masses, no testicular masses or atrophy, no cutaneous lesions, and no urethral discharge.   Msk:  No obvious tenderness, swelling or signs of inflammation on back or peripheral joints.  Pulses:  normal peripheral pulses.  Extremities:  no cyanosis, clubbing or edema  Neurologic:  non focal Axillary Nodes:  negative.  Psych:  Oriented X3 and normally interactive.     Impression & Recommendations:  Problem # 1:  SEXUALLY TRANSMITTED DISEASE, EXPOSURE TO (ICD-V01.6) Given unprotected encounter will check for STD and HIV.   Orders: T-HIV Viral Load [id number]) T-GC Probe, urine [id number]) T-Chlamydia  Probe, urine [id number]) T-Urinalysis ([id number]) T-Syphilis Test (RPR) [phone])  Problem # 2:  BREAST PAIN, LEFT (ICD-611.71) Will need to rule out breast cancer given family history of cancer. Will get a mammogram done.   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 Our notes indicates that he has not had any narcotics from [place] in a  long time. Will defer pain meds prescription to [facility].   Problem # 4:  ELEVATED BLOOD PRESSURE (ICD-796.2) Will ask patient to restart his meds. Check B-met today.   His updated medication list for this problem includes:    Hydrochlorothiazide 25 Mg Tabs (Hydrochlorothiazide) ..... One by mouth once daily    Lisinopril 10 Mg Tabs (Lisinopril) .[name]... Take 1 tablet by mouth once a day  Orders: T-Basic Metabolic Panel ([id number])  Problem # 5:  COUGH (ICD-786.2) Sincer PPI is helping him some will ask him to continue with the same for now. Will increase it to 40 mg a day. He is having cough even off lisinopril so don't think it is related to that. Will review him on next visit.   Complete Medication List: 1)  Viagra 100 Mg Tabs (Sildenafil citrate) .... Take one tablet one hour before sex 2)  Lidoderm 5 % Ptch (Lidocaine) .... Once daily once  a day for 12 hrs keep 12hr off. 3)  Neurontin 300 Mg Caps (Gabapentin) .... Take 1 tablet by mouth two times a day as needed for numbness 4)  Hydrochlorothiazide 25 Mg Tabs (Hydrochlorothiazide) .... One by mouth once daily 5)  Lisinopril 10 Mg Tabs (Lisinopril) .... Take 1 tablet by mouth once a day 6)  Tramadol Hcl 50 Mg Tabs (Tramadol hcl) .... Take 1 tablet every 12 hour for pain as needed. 7)  Omeprazole 40 Mg Cpdr (Omeprazole) .... Take 1 tablet by mouth once a day  Patient Instructions: 1)  Please schedule a follow-up appointment in 1 month. 2)  We will let you know if anything wrong with your lab work.   Prescriptions: OMEPRAZOLE 40 MG CPDR (OMEPRAZOLE) Take 1 tablet by mouth once a day  #31 x 3   Entered and Authorized by:   Zara Council MD   Signed by:   Zara Council MD on 02/09/2009   Method used:   Electronically to        [facility] [phone]* (retail)       [address]       [place], [place]  [address]       Ph: [phone]       Fax: [phone]   RxID:   [id number] LISINOPRIL 10 MG TABS (LISINOPRIL) Take 1  tablet by mouth once a day  #90 x 4   Entered and Authorized by:   Zara Council MD   Signed by:   Zara Council MD on 02/09/2009   Method used:   Electronically to        [facility] [phone]* (retail)       [address]       [place], [place]  [address]       Ph: [phone]       Fax: [phone]   RxID:   [id number] HYDROCHLOROTHIAZIDE 25 MG TABS (HYDROCHLOROTHIAZIDE) one by mouth once daily  #90 x 4   Entered and Authorized by:   Zara Council MD   Signed by:   Zara Council MD on 02/09/2009   Method used:   Electronically to        [facility] [phone]* (retail)       [address]       [place], [place]  [address]       Ph: [phone]       Fax: [phone]   RxID:   [id number]   Prevention & Chronic Care Immunizations   Influenza vaccine: Not documented    Tetanus booster: Not documented    Pneumococcal vaccine: Not documented  Other Screening   PSA: Not documented   Smoking status: quit > 6 months  (02/09/2009)  Lipids   Total Cholesterol: Not documented   LDL: Not documented   LDL Direct: Not documented   HDL: Not documented   Triglycerides: Not documented

## 2010-08-27 NOTE — Letter (Signed)
Summary: [facility]  [facility]   Imported By: Margie Billet 10/26/2009 16:03:06  _____________________________________________________________________  External Attachment:    Type:   Image     Comment:   External Document

## 2010-08-27 NOTE — Letter (Signed)
Summary: SOCIAL WORKER [facility] TESSITORE  SOCIAL WORKER /DONNA TESSITORE   Imported By: Margie Billet 11/09/2009 14:38:40  _____________________________________________________________________  External Attachment:    Type:   Image     Comment:   External Document

## 2010-08-29 NOTE — Progress Notes (Signed)
Summary: prior authorization-Ventolin//kg  Phone Note From Pharmacy   Caller: [facility] [address] [phone]* Reason for Call: Medication not on formulary Request: Needs authorization from insurer Summary of Call: Received faxed request from pharmacy that Ventolin is not covered by pt's insurance plan.  Pharmacy request rx be changed to Proair (albuterol).  If MD feels pt should take the Ventolin instead, then a prior authorization form will have to be completed and faxed.  The number to call for PA is [phone].  Will forward med change request to Dr Narda Bonds for review.  Should she agree to the change, then ventolin will need to be removed from the med list and proair added.Cynda F[facility]ilia [facility])  July 25, 2010 1:25 PM   Follow-up for Phone Call        Not sure why they want to change from Ventolin to Proair. It appears Proair is more expensive. Will change this on his med list. Prescription resent to [facility]. Follow-up by: Jaci Lazier MD,  July 29, 2010 3:35 PM    New/Updated Medications: PROAIR HFA 108 (90 BASE) MCG/ACT AERS (ALBUTEROL SULFATE) inhale 2 puffs every 4 to 6 hours for shortness of breath Prescriptions: PROAIR HFA 108 (90 BASE) MCG/ACT AERS (ALBUTEROL SULFATE) inhale 2 puffs every 4 to 6 hours for shortness of breath  #1 x 6   Entered and Authorized by:   Jaci Lazier MD   Signed by:   Jaci Lazier MD on 07/29/2010   Method used:   Faxed to ...       [facility] [address] [phone]* (retail)       [address]       [place], [place]  [address]       Ph: [phone]       Fax: [phone]   RxID:   [id number]   Appended Document: prior authorization-Ventolin//kg Contacted pharmacy and rx went through without any problems.  Message left on pt's recorder to make him aware of the changes.

## 2010-08-29 NOTE — Consult Note (Signed)
Summary: DR.SIKORA  DR.SIKORA   Imported By: Margie Billet 08/06/2010 15:27:51  _____________________________________________________________________  External Attachment:    Type:   Image     Comment:   External Document

## 2010-08-29 NOTE — Progress Notes (Signed)
  Phone Note Refill Request Message from:  Fax from Pharmacy on August 06, 2010 4:58 PM  Refills Requested: Medication #1:  LOSARTAN POTASSIUM 50 MG TABS Take 1 tablet by mouth once a day   Last Refilled: 12/8 Initial call taken by: Marin Roberts RN,  August 06, 2010 4:58 PM    Prescriptions: Jerome Adams POTASSIUM 50 MG TABS (LOSARTAN POTASSIUM) Take 1 tablet by mouth once a day  #30 x 1   Entered and Authorized by:   Darnelle Maffucci MD   Signed by:   Darnelle Maffucci MD on 08/06/2010   Method used:   Electronically to        [facility] [phone]* (retail)       [address]       [place], [place]  [address]       Ph: [phone]       Fax: [phone]   RxID:   [id number]

## 2010-09-09 ENCOUNTER — Ambulatory Visit: Payer: Self-pay | Admitting: Family Medicine

## 2010-10-28 ENCOUNTER — Encounter: Payer: Self-pay | Admitting: Internal Medicine

## 2010-11-19 ENCOUNTER — Ambulatory Visit (HOSPITAL_COMMUNITY)
Admission: RE | Admit: 2010-11-19 | Discharge: 2010-11-19 | Disposition: A | Discharge status: Home / Self Care | Payer: Medicare Other | Source: Ambulatory Visit | Attending: Internal Medicine | Admitting: Internal Medicine

## 2010-11-19 ENCOUNTER — Ambulatory Visit (INDEPENDENT_AMBULATORY_CARE_PROVIDER_SITE_OTHER): Payer: Medicaid Other | Admitting: Internal Medicine

## 2010-11-19 ENCOUNTER — Encounter: Payer: Self-pay | Admitting: Internal Medicine

## 2010-11-19 VITALS — BP 149/91 | HR 85 | Temp 98.1°F | Ht 69.0 in | Wt 213.5 lb

## 2010-11-19 DIAGNOSIS — M5106 Intervertebral disc disorders with myelopathy, lumbar region: Secondary | ICD-10-CM

## 2010-11-19 DIAGNOSIS — R030 Elevated blood-pressure reading, without diagnosis of hypertension: Secondary | ICD-10-CM | POA: Diagnosis not present

## 2010-11-19 DIAGNOSIS — Z0000 Encounter for general adult medical examination without abnormal findings: Secondary | ICD-10-CM | POA: Diagnosis not present

## 2010-11-19 DIAGNOSIS — F528 Other sexual dysfunction not due to a substance or known physiological condition: Secondary | ICD-10-CM | POA: Diagnosis not present

## 2010-11-19 DIAGNOSIS — M25569 Pain in unspecified knee: Secondary | ICD-10-CM

## 2010-11-19 DIAGNOSIS — M25469 Effusion, unspecified knee: Secondary | ICD-10-CM | POA: Insufficient documentation

## 2010-11-19 LAB — TESTOSTERONE: Testosterone: 290.37 ng/dL (ref 250–890)

## 2010-11-19 LAB — CBC
HCT: 44 % (ref 39.0–52.0)
Hemoglobin: 14 g/dL (ref 13.0–17.0)
MCH: 24.9 pg — ABNORMAL LOW (ref 26.0–34.0)
MCHC: 31.8 g/dL (ref 30.0–36.0)
MCV: 78.3 fL (ref 78.0–100.0)
Platelets: 271 10*3/uL (ref 150–400)
RBC: 5.62 MIL/uL (ref 4.22–5.81)
RDW: 16.1 % — ABNORMAL HIGH (ref 11.5–15.5)
WBC: 8 10*3/uL (ref 4.0–10.5)

## 2010-11-19 LAB — COMPREHENSIVE METABOLIC PANEL
ALT: 18 U/L (ref 0–53)
AST: 21 U/L (ref 0–37)
Albumin: 4.9 g/dL (ref 3.5–5.2)
Alkaline Phosphatase: 83 U/L (ref 39–117)
BUN: 12 mg/dL (ref 6–23)
CO2: 20 mEq/L (ref 19–32)
Calcium: 10.1 mg/dL (ref 8.4–10.5)
Chloride: 108 mEq/L (ref 96–112)
Creat: 1.21 mg/dL (ref 0.40–1.50)
Glucose, Bld: 111 mg/dL — ABNORMAL HIGH (ref 70–99)
Potassium: 4.1 mEq/L (ref 3.5–5.3)
Sodium: 143 mEq/L (ref 135–145)
Total Bilirubin: 0.4 mg/dL (ref 0.3–1.2)
Total Protein: 7.5 g/dL (ref 6.0–8.3)

## 2010-11-19 LAB — LIPID PANEL
Cholesterol: 298 mg/dL — ABNORMAL HIGH (ref 0–200)
HDL: 27 mg/dL — ABNORMAL LOW (ref >39)
Total CHOL/HDL Ratio: 11 Ratio
Triglycerides: 1424 mg/dL — ABNORMAL HIGH (ref <150)

## 2010-11-19 NOTE — Progress Notes (Signed)
  Subjective:    Patient ID: Jerome Adams, male    DOB: 01/07/1961, 50 y.o.   MRN: [id number]  HPI  Patient is a 50 year old male, with a past medical history of hypertension, chronic lower back pain, depression, and erectile dysfunction. Presents to the outpatient clinic for routine followup. he states that he has not been fully compliant with all of his medications. He reports not having taken his blood pressure medication for the past 3-4 days. Patient is a would like me to fill out paperwork for a Viagra drug assistance form. he has contacted the company and was told to come his primary care doctor to arrange this. However inform the patient that we cannot provide this service, do not have the staff to accept and dispense these medications from our clinic. Patient also complains of symptoms of fatigue. He feels that his symptoms are related to hypogonadism, and would like to be tested for this condition. Reports continuous lower back pain and he seen neurosurgery for this, he states that they are considering placing a spinal pacer. Also reports left knee pain for the past several weeks, Patient currently denies shortness of breath, chest pain, palpitation, and states that his depression is at baseline, denies any SI/HI.   Review of Systems  All other systems reviewed and are negative.       Objective:   Physical Exam  Constitutional: He is oriented to person, place, and time. He appears well-developed and well-nourished.  HENT:  Head: Normocephalic and atraumatic.  Eyes: Pupils are equal, round, and reactive to light.  Neck: Normal range of motion. No JVD present. No thyromegaly present.  Cardiovascular: Normal rate, regular rhythm and normal heart sounds.   Pulmonary/Chest: Effort normal and breath sounds normal. He has no wheezes. He has no rales.  Abdominal: Soft. Bowel sounds are normal. There is no tenderness. There is no rebound.  Musculoskeletal: Normal range of motion. He  exhibits no edema.  Neurological: He is alert and oriented to person, place, and time.  Skin: Skin is warm and dry.          Assessment & Plan:

## 2010-11-19 NOTE — Assessment & Plan Note (Signed)
We'll check testosterone level along with routine labs. Otherwise the patient is unable to afford his Viagra. Would need assistance to receive it.

## 2010-11-19 NOTE — Assessment & Plan Note (Signed)
Additionally to follow with neurosurgery, as they're considering placing spinal pacer for pain relief.

## 2010-11-19 NOTE — Assessment & Plan Note (Signed)
We'll check routine labs today, including fasting lipids. On chart review the patient had a positive hepatitis C antibody was negative quantitative test. Will recheck this today to ensure resolution of the infection.

## 2010-11-19 NOTE — Assessment & Plan Note (Signed)
She reports he does not take blood pressure medication regularly. I Informed him that it is essential that he takes blood pressure medication regularly. Check his blood pressure at next followup.

## 2010-11-20 ENCOUNTER — Telehealth: Payer: Self-pay | Admitting: Internal Medicine

## 2010-11-20 DIAGNOSIS — R768 Other specified abnormal immunological findings in serum: Secondary | ICD-10-CM | POA: Insufficient documentation

## 2010-11-20 LAB — HEPATITIS C ANTIBODY: HCV Ab: REACTIVE — AB

## 2010-11-20 NOTE — Telephone Encounter (Signed)
Patient had this test positive last year, with a negative quantitative test. This was repeated November 19, 2010, to see if the test was false positive, this was repeated and again found to be hepatitis C antibody positive. We will consider testing for quantitative hepatitis C titers, if positive this time and it will confirm active hepatitis C, if negative we can assume the patient has cleared the infection. Patient informed of this information and is aware of the plan to test for quantitative hepatitis C titer again.

## 2010-12-10 NOTE — Op Note (Signed)
NAME:  Jerome Adams, Jerome Adams              ACCOUNT NO.:  [id number]   MEDICAL RECORD NO.:  [id number]          PATIENT TYPE:  INP   LOCATION:  4732                         FACILITY:  [facility]   PHYSICIAN:  Charlynne Pander, D.D.S.DATE OF BIRTH:  August 08, 1960   DATE OF PROCEDURE:  12/29/2007  DATE OF DISCHARGE:                               OPERATIVE REPORT   PREOPERATIVE DIAGNOSES:  1. Left facial swelling.  2. Periapical abscess.  3. Apical periodontitis, tooth #18.  4. Severe trismus.   POSTOPERATIVE DIAGNOSES:  1. Left facial swelling.  2. Periapical abscess.  3. Apical periodontitis, tooth #18.  4. Severe trismus.   OPERATIONS:  1. Extraction of tooth #18 with alveoloplasty.  2. Incision and drainage of the left buccal vestibule area, #18      through #20.   SURGEON:  Charlynne Pander, DDS   ASSISTANT:  1. [employer], corporate).  2. Micheline Maze (dental student).   ANESTHESIA:  Monitored anesthesia care per the anesthesia team.   MEDICATIONS:  1. Clindamycin 900 mg IV prior to invasive dental procedures.  2. Local anesthesia with a total utilization of 2 carpules each      containing 36 mg of Xylocaine with 0.018 mg of epinephrine as well      as 1 carpule containing 9 mg of bupivacaine with 0.009 mg of      epinephrine.   SPECIMEN:  There was 1 tooth, which was discarded.   CULTURES:  None.   DRAINS:  None.   ESTIMATED BLOOD LOSS:  Less than 25 mL.   FLUIDS:  400 mL lactated Ringer solution.   COMPLICATIONS:  None.   INDICATIONS:  The patient was admitted with a history of chest pain.  During that time, the patient developed left facial swelling and tooth  pain.  A dental consultation was requested to evaluate the patient and  provide dental treatment as indicated.  The patient was then examined  and treatment planned for extraction of indicated teeth (specifically  tooth #18) with alveoloplasty and incision and drainage as needed.  This  treatment plan was formulated to decrease the risk and complications  associated with dental infection from further affecting the patient's  systemic health.   OPERATIVE FINDINGS:  The patient was examined in operating room #7.  Tooth #18 was identified for extraction.  Tooth #17 has a full bony  impaction and the patient will need to follow up with an oral surgeon of  his choice for subsequent dental extraction as indicated.  The patient  was noted to be affected by severe trismus, periapical abscess, apical  periodontitis, and chronic periodontitis.  The aforementioned  necessitated the removal of tooth #18 with alveoloplasty along with  incision and drainage of the buccal vestibule on the area of tooth #18  through #20.   DESCRIPTION OF PROCEDURE:  The patient was brought to the main operating  room #7.  The patient was then placed in the supine position on the  operating room table.  Monitored anesthesia care was then induced per  the anesthesia team.  The patient was then  prepped and draped in the  usual manner for dental medicine procedure.  A time-out was performed.  The patient was identified and procedure was verified.  The oral cavity  was then thoroughly examined and the findings were noted above.  The  patient was then ready for the dental medicine procedure as follows:   Local anesthesia was administered via a mandibular inferior alveolar  nerve block utilizing the bupivacaine with epinephrine.  Further  infiltration was then achieved utilizing 2 carpules of Xylocaine with  epinephrine.   Tooth #18 was then approached.  A bite block was attempted to be placed,  but due to significant trismus, it was unable to be placed in the normal  fashion.  The bite block was then placed on its side due to the trismus  and this was able to stabilize the dentition while the patient had the  dental procedures performed.  A Woodson elevator was then utilized to  remove soft tissue from  the hard tissue around tooth #18.  This tooth  was then subluxated with a series of straight elevators.  A 151 forceps  was then able to be placed on the tooth and allowed for the extraction  of tooth #18 without further complication.  At this point in time, the  socket was curetted and compressed appropriately.  Alveoloplasty was  then performed with rongeurs as indicated.  At this point in time, a 15  blade incision was made in the buccal vestibule from area #18 through  area #20.  The area was  dissected and explored with a hemostat  appropriately.  No significant purulence was noted at this time.  The  surgical site and area of incision and drainage was then irrigated with  copious amounts of sterile saline x6.  At this point in time, the left  side of the face was compressed and no further significant heme was  noted to be expressing from either extraction site or incision and  drainage site.  Decision was made not to search through the area closed  at this time and this area will be allowed to heal in by secondary  intention.  The incision site area #18 through #20 was also left to heal  in by secondary intention.  At this point in time, the entire mouth was  irrigated with copious amounts of sterile saline.  The patient was  examined for complications, seeing none, dental medicine procedure was  deemed to be complete.  The patient was then handed over to the  anesthesia team for final disposition.  After appropriate amount of  time, the patient was taken to the postanesthesia care unit with stable  vital signs and good oxygenation level.  All counts were correct for  dental medicine procedure.   The patient will be seen in approximately 1 week for evaluation of  healing.  The patient is to then follow with a general dentist of his  choice for exam, dental x-rays, and overall treatment planning.  The  patient is aware that he will need to follow up with an oral surgeon for   surgical extraction of tooth #17 as indicated.  Please note:  Dr.  Manson Passey was consulted concerning treatment of the patient at this time and  did agree with selective extraction of tooth #18 at this time to allow  the dental infection to resolve before proceeding with surgical  extraction of impacted tooth #17.      Charlynne Pander, D.D.S.  Electronically Signed  RFK/MEDQ  D:  12/29/2007  T:  12/30/2007  Job:  [id number]   cc:   Grant Ruts., D.D.S.

## 2010-12-10 NOTE — Discharge Summary (Signed)
NAME:  Jerome Adams, Jerome Adams              ACCOUNT NO.:  [id number]   MEDICAL RECORD NO.:  [id number]          PATIENT TYPE:  INP   LOCATION:  4732                         FACILITY:  [facility]   PHYSICIAN:  Eliseo Gum, M.D.   DATE OF BIRTH:  11-Jun-1961   DATE OF ADMISSION:  12/26/2007  DATE OF DISCHARGE:  12/31/2007                               DISCHARGE SUMMARY   DISCHARGE DIAGNOSIS:  1. 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  2. Chest pain secondary to cocaine.  3. 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  4. Cocaine abuse.  5. Tobacco abuse.  6. Hypertension.  7. History of methicillin-resistant Staphylococcus aureus infection in      right elbow.  8. Degenerative joint disease awaiting possible posterior lumbar      interbody fusion.  9. Patent foramen ovale.   DISCHARGE MEDICATIONS:  1. Carvedilol 3.125 mg twice a day by mouth.  2. Lisinopril 5 mg once daily by mouth.  3. Aspirin 81 mg once daily.  4. Zocor 40 mg by mouth once daily.  5. Vicodin 10/500 one tablet every 6 hours as required for pain.  6. Clindamycin 450 mg 4 times a day for 5 days.   DISPOSITION AND FOLLOWUP:  The patient is sent home in a stable  condition.  The patient has been given the following followup  appointments:  1. The patient will follow with Dr. Kristin Bruins at the [facility] on January 05, 2008, at 1 o'clock.  At the followup visit,      the patient will be reviewed for resolution of his dental problem.  2. The patient will follow with [facility].      A followup appointment has been given for January 11, 2008, at 12:30.      At the followup visit, the patient will be reviewed in terms of his      cardiomyopathy and necessary adjustment will be made on his      medications in terms of heart failure and hypertension.  His other      primary care needs will be addressed at the followup visit.  3. The patient will  follow with Dr. Manson Passey for possible intervention he      might need in terms of his dental problem.  The patient has been      given Dr. Theora Gianotti phone number, [phone] to get a followup      appointment.   STUDIES/PROCEDURES:  1. Chest x-ray on Dec 26, 2007, no evidence of acute cardiopulmonary      disease.  2. Orthopantogram on Dec 26, 2007.  Impression, left lower second      molar caries.  Periodontal abscess involving the left lower molars.      Lytic defect in the mandible adjacent to the left third molar.      Developing osteomyelitis not excluded.  3. Abdominal x-ray on Dec 26, 2007.  Impression, no evidence of acute      abdominal process.  4. Myocardial perfusion scan on December 28, 2007.  Impression, no definite      initial ischemia with pharmacologic stress.  Calculated ejection      fraction 46%.  5. A 2D echo on December 27, 2007.  Summary, overall left ventricular      systolic function was normal.  Left ventricular ejection fraction      estimated to be 55%.  No left ventricular regional wall motion      abnormalities.  Increased relative contribution of atrial      contraction to left ventricular filling.  Doppler primary test is      consistent with abnormal left ventricular relaxation.  A thin      mobile density in the aortic valve visualized.  6. Transesophageal echo December 30, 2007.  Impression, no      echocardiographic evidence of valvular vegetation.  7. Operation      a.     Extraction of tooth # 18 with alveoloplasty.      b.     Incision and drainage of the left buccal vestibule area #18       through #20 by Dr. Charlynne Pander, D.D.S.   CONSULTS:  Dental consult, Dr. Charlynne Pander, D.D.S.   BRIEF ADMISSION HISTORY AND PHYSICAL:  Mr. Griffie is a 50 year old man  with past medical history of cocaine abuse, tobacco abuse, and  hypertension who presented with chest pain, which started on the morning  on the day of admission.  He woke up with chest pain from his  sleep.  Pain was 6.5/10 at its worst and he described it as a dull pressure  sensation over the left chest.  There was no radiation, no nausea, or  vomiting.  He reported shortness of breath, which occurred in patient  who was chronic and stable.  He also mentioned palpitations and heavy  beats and heart racing.  He denied any syncope.  He gave history of  smoking cigarettes one pack per day for the past 30 years and using  cocaine as well.  He has family history of MI in his father at the age  of 51 and he also has a sister with CHF on pacer.  He also reported pain  in his left jaw, which started about 2 days prior to admission.  He has  had a bad tooth in that area for years, and it has never been removed.  On the morning of admission, he woke to find his left face and neck very  swollen and painful.   ADMISSION PHYSICAL:  VITAL SIGNS:  Temperature 99.3, blood pressure  151/93, pulse 90, respiratory rate 18, and oxygen saturation 96% on room  air.  GENERAL:  Alert and oriented x3.  EYES:  EOMI, muddy sclerae, no icterus.  No injection.  NECK:  Supple.  No cervical lymphadenopathy.  Left jaw diffusely swollen  and tender with raised temperature.  No regional lymphadenopathy noted.  CHEST:  Clear to auscultation bilaterally with good air movement.  CARDIOVASCULAR:  Regular rate and rhythm with no murmur, rubs, or  gallop.  ABDOMEN:  Bowel sounds positive, slight tender to percussion in the left  lower quadrant.  EXTREMITIES:  Pulses 2+.  No edema.  SKIN:  Dry and warm.  No lesions.  NEURO:  Nonfocal.  PSYCH:  Appropriate.   ADMISSION LABS:  Sodium 140, potassium 4.1, chloride 109, bicarbonate  26, BUN 10, creatinine 1.24, blood glucose 93, bilirubin 0.7, alkaline  phosphatase 68, ALT 15, AST 20, protein 6,  albumin 3.6, calcium 8.7.  Hemoglobin 13.9, MCV 76.9, WBC 11.9, ANC 8.6, platelet 141.  PT 12.7,  INR 0.9, BNP less than 30, A1c 5.7, and TSH 1.57.  UDS positive for  cocaine  and opiates.  Point of care troponin less than 0.05.   HOSPITAL COURSE:  1. Chest pain.  The patient was admitted for observation and further      workup on his problem.  The patient did have significant risk      factors in terms of coronary artery disease.  A cardiac      consultation was done and he subsequently underwent a Myoview      stress test, results of which are mentioned as above.  Overall, the      patient did not have any significant EKG changes and his cardiac      enzymes did not elevate significantly.  Chest pain was most likely      secondary to cocaine.  Following results of Myoview, he was started      on medical management for cardiomyopathy.  He did not have any      further episodes of chest pain in the hospital.  2. Left jaw swelling.  This was secondary to dental abscess and he was      immediately started on broad-spectrum antibiotics with IV      clindamycin.  There was some suspicion of possible jaw      osteomyelitis in the pain x-ray, hence dental evaluation was done      after this the patient subsequently underwent tooth extraction      procedure.  The patient is sent home on Avelox to complete a total      of 10-day course of antibiotics.  3. Hypertension.  The patient presented with a very high blood      pressure of 151/93, which persisted.  This was most likely      secondary to cocaine and in view of the fact that he does have some      diastolic dysfunction and cardiomyopathy.  We started treatment      right away and placed him on lisinopril and Coreg.  4. Cocaine use.  The patient was counseled on the harm of drug abuse.      The patient seems to understand and is willing to wait abusing      drugs.  5. Tobacco abuse.  The patient was given smoking cessation consult.  6. An abnormal mobile lesion was seen on 2D echo following which the      patient underwent transesophageal echo procedure, which ruled out      any abnormal vegetation in the  aortic valve area, but the patient      was found to have patent foramen ovale.  Hence, the patient is      advised to continue on aspirin.   DISCHARGE DAY LABS:  WBC 5.4, hemoglobin 13.6, platelet 274.  Sodium  136, potassium 4.1, chloride 97, bicarbonate 29, glucose 102, BUN 9,  creatinine 1.29, and calcium 9.0.   DISCHARGE DAY VITALS:  Temperature 97.4, pulse 68, respirations 20,  blood pressure 102/65, and oxygen saturation 96% on room air.   On the day of discharge, the patient was stable and was not complaining  of any chest pain.  His jaw swelling was subsiding and he was able to  take by mouth.  Other than that his physical examination did not change  significantly from his admission physical.  Zara Council, MD  Electronically Signed      Eliseo Gum, M.D.  Electronically Signed    AS/MEDQ  D:  01/04/2008  T:  01/05/2008  Job:  [id number]
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
## 2010-12-13 ENCOUNTER — Other Ambulatory Visit: Payer: Self-pay | Admitting: Internal Medicine
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
## 2010-12-13 NOTE — Discharge Summary (Signed)
NAME:  Jerome Adams, Jerome Adams              ACCOUNT NO.:  [id number]   MEDICAL RECORD NO.:  [id number]          PATIENT TYPE:  INP   LOCATION:  5710                         FACILITY:  [facility]   PHYSICIAN:  Mobolaji B. Bakare, M.D.DATE OF BIRTH:  05-01-1961   DATE OF ADMISSION:  10/27/2005  DATE OF DISCHARGE:  10/31/2005                                 DISCHARGE SUMMARY   PRIMARY CARE PHYSICIAN:  Unassigned.   FINAL DIAGNOSES:  1.  Methicillin-resistant Staphylococcus aureus cellulitis involving right      elbow.  2.  Hypertension.  3.  Tobacco abuse.  4.  Mild normocytic anemia.   BRIEF HISTORY:  [name] is a 50 year old African American male who  developed right midline swelling, right elbow, about 4 days prior to  hospitalization.  There was an antecedent history of fall approximately 3  weeks prior to hospitalization.  He was initially seen and evaluated at  Urgent Care and received a prescription for doxycycline.  He could not  obtain this prescription because of financial reasons, hence the wound got  worse and he developed more swelling and came to the hospital.  Please see  admission H&P for full details.   HOSPITAL COURSE:  PROBLEM #1 - RIGHT ELBOW CELLULITIS:  Wound culture was  taken at presentation and this grew methicillin-resistant Staph aureus.  On  admission the patient was started on IV vancomycin for presumed community-  acquired MRSA, prior to final report.  He had a CT scan of right elbow which  did not show any abscess; it was compatible with stranding in subcutaneous  tissue, suggestive of cellulitis.  There was no osteomyelitis noted.  The  patient received 4 days of vancomycin in the hospital; the swelling  improved.  He was discharged home on p.o. vancomycin and pharmacy was able  to fill some prescription for him.  The patient had an incision and drainage  done at the Urgent Care prior to hospitalization and this ceased to be  draining at the time of admission.   Dressing was continued at the site.   PROBLEM #2 - HYPERTENSION:  The patient was noted during the course of  hospitalization to have high blood pressure.  He was appropriately started  on hydrochlorothiazide.   PROBLEM #3 - TOBACCO ABUSE:  The patient was counseled regarding tobacco  cessation.   PROBLEM #4 - MILD ANEMIA:  He had an anemia panel which was compatible with  anemia of chronic disease and was felt to be secondary to hemodilution.  Two  Hemoccults were negative.  Ferritin was within normal to 29, but a low iron  at 24 and TIBC of 293.   PROCEDURE:  1.  Right elbow x-ray showed diffuse soft tissue swelling, no acute ulcers      or abnormalities.  2.  CT scan of the elbow showed stranding in the subcutaneous soft tissue      compatible with cellulitis, no abscess noted.   DISCHARGE CONDITION:  Improved and stable.   DISCHARGE MEDICATIONS:  1.  Doxycycline 100 mg two times a day.  2.  Hydrochlorothiazide 25 mg  daily.  3.  Percocet 5/325 mg one to two q.4 h. as needed.   FOLLOWUP:  Follow up with [facility]; the patient was to call for  appointment.      Mobolaji B. Corky Downs, M.D.  Electronically Signed     MBB/MEDQ  D:  12/08/2005  T:  12/09/2005  Job:  [id number]

## 2010-12-18 ENCOUNTER — Inpatient Hospital Stay (INDEPENDENT_AMBULATORY_CARE_PROVIDER_SITE_OTHER)
Admission: RE | Admit: 2010-12-18 | Discharge: 2010-12-18 | Disposition: A | Discharge status: Home / Self Care | Payer: PRIVATE HEALTH INSURANCE | Source: Ambulatory Visit

## 2010-12-18 ENCOUNTER — Ambulatory Visit (INDEPENDENT_AMBULATORY_CARE_PROVIDER_SITE_OTHER): Payer: Medicare Other

## 2010-12-18 ENCOUNTER — Telehealth: Payer: Self-pay | Admitting: *Deleted

## 2010-12-18 DIAGNOSIS — R10814 Left lower quadrant abdominal tenderness: Secondary | ICD-10-CM | POA: Diagnosis not present

## 2010-12-18 LAB — CBC
HCT: 45.4 % (ref 39.0–52.0)
Hemoglobin: 15.1 g/dL (ref 13.0–17.0)
MCH: 25.2 pg — ABNORMAL LOW (ref 26.0–34.0)
MCHC: 33.3 g/dL (ref 30.0–36.0)
MCV: 75.8 fL — ABNORMAL LOW (ref 78.0–100.0)
Platelets: 258 10*3/uL (ref 150–400)
RBC: 5.99 MIL/uL — ABNORMAL HIGH (ref 4.22–5.81)
RDW: 15 % (ref 11.5–15.5)
WBC: 9.7 10*3/uL (ref 4.0–10.5)

## 2010-12-18 LAB — DIFFERENTIAL
Basophils Absolute: 0 10*3/uL (ref 0.0–0.1)
Basophils Relative: 0 % (ref 0–1)
Eosinophils Absolute: 0.1 10*3/uL (ref 0.0–0.7)
Eosinophils Relative: 1 % (ref 0–5)
Lymphocytes Relative: 36 % (ref 12–46)
Lymphs Abs: 3.5 10*3/uL (ref 0.7–4.0)
Monocytes Absolute: 0.6 10*3/uL (ref 0.1–1.0)
Monocytes Relative: 6 % (ref 3–12)
Neutro Abs: 5.6 10*3/uL (ref 1.7–7.7)
Neutrophils Relative %: 57 % (ref 43–77)

## 2010-12-18 NOTE — Telephone Encounter (Signed)
Pt called with c/o left side abd pain.  Onset yesterday. Rates pain 7/10 , constant. Denies nausea, vomiting. Has not taken any pain meds.  Last BM yesterday.    Pt c/o severe pain, he is on his way to [facility]. I offered appointment for after lunch but he does not want to wait since pain has been  Ongoing since yesterday.

## 2010-12-19 LAB — POCT URINALYSIS DIP (DEVICE)
Bilirubin Urine: NEGATIVE
Glucose, UA: NEGATIVE mg/dL
Hgb urine dipstick: NEGATIVE
Ketones, ur: NEGATIVE mg/dL
Nitrite: NEGATIVE
Protein, ur: 30 mg/dL — AB
Specific Gravity, Urine: >=1.03 (ref 1.005–1.030)
Urobilinogen, UA: 0.2 mg/dL (ref 0.0–1.0)
pH: 5 (ref 5.0–8.0)

## 2010-12-19 LAB — POCT I-STAT, CHEM 8
BUN: 12 mg/dL (ref 6–23)
Calcium, Ion: 1.09 mmol/L — ABNORMAL LOW (ref 1.12–1.32)
Chloride: 102 mEq/L (ref 96–112)
Creatinine, Ser: 1.5 mg/dL (ref 0.4–1.5)
Glucose, Bld: 111 mg/dL — ABNORMAL HIGH (ref 70–99)
HCT: 52 % (ref 39.0–52.0)
Hemoglobin: 17.7 g/dL — ABNORMAL HIGH (ref 13.0–17.0)
Potassium: 4.9 mEq/L (ref 3.5–5.1)
Sodium: 138 mEq/L (ref 135–145)
TCO2: 30 mmol/L (ref 0–100)

## 2010-12-20 ENCOUNTER — Encounter: Payer: Self-pay | Admitting: Internal Medicine

## 2010-12-27 ENCOUNTER — Encounter: Payer: Self-pay | Admitting: Internal Medicine

## 2011-02-25 ENCOUNTER — Other Ambulatory Visit: Payer: Self-pay | Admitting: *Deleted

## 2011-02-25 MED ORDER — HYDROCHLOROTHIAZIDE 25 MG PO TABS: 25.0000 mg | ORAL_TABLET | Freq: Every day | ORAL | Status: DC

## 2011-03-09 ENCOUNTER — Emergency Department (HOSPITAL_COMMUNITY)
Admission: EM | Admit: 2011-03-09 | Discharge: 2011-03-09 | Disposition: A | Discharge status: Home / Self Care | Payer: No Typology Code available for payment source | Attending: Emergency Medicine | Admitting: Emergency Medicine

## 2011-03-09 ENCOUNTER — Emergency Department (HOSPITAL_COMMUNITY): Payer: No Typology Code available for payment source

## 2011-03-09 DIAGNOSIS — R51 Headache: Secondary | ICD-10-CM | POA: Insufficient documentation

## 2011-03-09 DIAGNOSIS — M542 Cervicalgia: Secondary | ICD-10-CM | POA: Insufficient documentation

## 2011-03-09 DIAGNOSIS — Z79899 Other long term (current) drug therapy: Secondary | ICD-10-CM | POA: Insufficient documentation

## 2011-03-09 DIAGNOSIS — I10 Essential (primary) hypertension: Secondary | ICD-10-CM | POA: Insufficient documentation

## 2011-03-24 ENCOUNTER — Ambulatory Visit (INDEPENDENT_AMBULATORY_CARE_PROVIDER_SITE_OTHER): Payer: Medicare Other | Admitting: Internal Medicine

## 2011-03-24 ENCOUNTER — Ambulatory Visit (HOSPITAL_COMMUNITY)
Admission: RE | Admit: 2011-03-24 | Discharge: 2011-03-24 | Disposition: A | Discharge status: Home / Self Care | Payer: Medicare Other | Source: Ambulatory Visit | Attending: Internal Medicine | Admitting: Internal Medicine

## 2011-03-24 ENCOUNTER — Encounter: Payer: Self-pay | Admitting: Internal Medicine

## 2011-03-24 DIAGNOSIS — E349 Endocrine disorder, unspecified: Secondary | ICD-10-CM | POA: Insufficient documentation

## 2011-03-24 DIAGNOSIS — R894 Abnormal immunological findings in specimens from other organs, systems and tissues: Secondary | ICD-10-CM

## 2011-03-24 DIAGNOSIS — R030 Elevated blood-pressure reading, without diagnosis of hypertension: Secondary | ICD-10-CM | POA: Diagnosis not present

## 2011-03-24 DIAGNOSIS — I499 Cardiac arrhythmia, unspecified: Secondary | ICD-10-CM | POA: Insufficient documentation

## 2011-03-24 DIAGNOSIS — R9431 Abnormal electrocardiogram [ECG] [EKG]: Secondary | ICD-10-CM | POA: Insufficient documentation

## 2011-03-24 DIAGNOSIS — B1920 Unspecified viral hepatitis C without hepatic coma: Secondary | ICD-10-CM

## 2011-03-24 DIAGNOSIS — R768 Other specified abnormal immunological findings in serum: Secondary | ICD-10-CM

## 2011-03-24 DIAGNOSIS — R002 Palpitations: Secondary | ICD-10-CM | POA: Diagnosis not present

## 2011-03-24 DIAGNOSIS — E291 Testicular hypofunction: Secondary | ICD-10-CM

## 2011-03-24 MED ORDER — TESTOSTERONE 25 MG/2.5GM (1%) TD GEL: 1.0000 | TRANSDERMAL | Status: DC

## 2011-03-24 NOTE — Progress Notes (Signed)
  Subjective:    Patient ID: Jerome Adams, male    DOB: Mar 17, 1961, 50 y.o.   MRN: [id number]  HPI  Patient is a 50 year old male, with a past medical history of HCV, hypertension, chronic lower back pain, depression, and erectile dysfunction. Presents to the outpatient clinic for routine followup. Patient complains of palpitations that he has been having, denies CP or SOB. He states that he has not been fully compliant with all of his medications.  Patient also complains of symptoms of fatigue. He feels that his symptoms are related to hypogonadism, his testosterone level was check at last visit and were boarderline, patient would like to try treatment for this now. Also would like to know his options for treating his chronic HCV. Patient currently denies shortness of breath, chest pain, palpitation, and states that his depression is at baseline, denies any SI/HI.   Review of Systems  All other systems reviewed and are negative.       Objective:   Physical Exam  Constitutional: He is oriented to person, place, and time. He appears well-developed and well-nourished.  HENT:  Head: Normocephalic and atraumatic.  Eyes: Pupils are equal, round, and reactive to light.  Neck: Normal range of motion. No JVD present. No thyromegaly present.  Cardiovascular: Normal rate, regular rhythm and normal heart sounds.   Pulmonary/Chest: Effort normal and breath sounds normal. He has no wheezes. He has no rales.  Abdominal: Soft. Bowel sounds are normal. There is no tenderness. There is no rebound.  Musculoskeletal: Normal range of motion. He exhibits no edema.  Neurological: He is alert and oriented to person, place, and time.  Skin: Skin is warm and dry.          Assessment & Plan:

## 2011-03-24 NOTE — Patient Instructions (Signed)
Hepatitis C (Viral Infection of the Liver) Hepatitis C is a viral infection of the liver similar to Hepatitis B (previously called serum hepatitis). This infection may go undetected for months or years because symptoms may be absent or be very mild and not lead a person to seek medical care until well after the infection actually began. Chronic liver disease is the main danger of Hepatitis C. This may lead to scarring of the liver (cirrhosis), liver failure and liver cancer. CAUSES This infection is now most commonly transmitted from person to person through intravenous drug use and abuse, sharing needles, or intimate sexual relationships with a hepatitis C-infected person.  SYMPTOMS When symptoms are present, they may consist of:  Mild fatigue.   Darkening of the color of urine.   Sometimes mild yellowing of the skin and eyes (jaundice). Jaundice occurs because of the build up of bile in the blood.  DIAGNOSIS Diagnosis of active or chronic hepatitis C infection is made by testing blood for the presence of hepatitis C viral particles called RNA. Other tests to measure the status of current liver function, exclude other liver problems, or assess liver damage may also be done or advised. TREATMENT Drug treatment is available and recommended for patients with an increased risk of developing cirrhosis caused by the virus.  HOME CARE INSTRUCTIONS To avoid making your liver disease worse:  Strictly avoid all alcohol beverage ingestion.   Carefully review all new prescriptions of medicines with your doctors. Avoid drugs that are toxic to the liver. Some of these are:   Isoniazid.   Methyldopa.   Acetaminophen.   Anabolic steroids (muscle building drugs).   Erythromycin.   Oral contraceptives (birth control pills).   Check with your caregiver to make sure medicine you are currently taking will not be harmful.   Periodic blood tests may be required. Follow your caregiver's advice about  when you should have blood tests.   Avoid a physical/sexual relationship until advised otherwise by your caregiver.   Avoid activities that could expose other persons to your blood. Examples include sharing a toothbrush, nail clippers, razors, and needles.   Bed rest is not necessary. It may make you feel better. Recovery time is not related to the amount of rest you receive.  This infection is contagious. Follow the instructions in this section and any additional instructions from your caregiver to avoid spread of your infection to others. SEEK MEDICAL CARE IF:  You have increasing fatigue or weakness.   An oral temperature above 101 develops, or as your caregiver suggests.   You develop loss of appetite or have nausea and vomiting.   You develop jaundice.   You develop easy bruising or bleeding.  MAKE SURE YOU:   Understand these instructions.   Will watch your condition.   Will get help right away if you are not doing well or get worse.  Document Released: 07/11/2000 Document Re-Released: 10/08/2009 [facility] Patient Information 2011 [place], [place].

## 2011-03-24 NOTE — Assessment & Plan Note (Signed)
Well controlled on current treatment, No new changes made today, Will continue to monitor.   

## 2011-03-24 NOTE — Assessment & Plan Note (Signed)
Patient has been experiencing fatigue and erectile dysfunction, testosterone was checked and was borderline low. I have discussed this with the patient and opted to start treatment with low-dose testosterone gel for symptomatic relief.

## 2011-03-24 NOTE — Assessment & Plan Note (Signed)
Patient had this test positive last year, with a negative quantitative test. This was repeated November 19, 2010, to see if the test was false positive, this was repeated and again found to be hepatitis C antibody positive. Repeated hepatitis C. titers were positive. Patient most likely has chronic hep C, patient is asymptomatic, will refer to hepatitis clinic to establish care.

## 2011-03-24 NOTE — Assessment & Plan Note (Signed)
Patient denies any chest pain, or shortness of breath, reports that the palpitations but ongoing for several months and is currently ongoing. EKG obtained shows normal sinus rhythm, with a rate of 74, PR 184, QRS 76, QT 374, axis normal, no ST-T wave changes, and nonspecific T-wave inversions laterally in leads V3-6. At this time I feel that no further workup is required. If patient continues to have the symptoms, we'll order a 2-D echo and will refer to cardiology for possible monitor placement.

## 2011-04-23 LAB — CULTURE, BLOOD (ROUTINE X 2)
Culture: NO GROWTH
Culture: NO GROWTH

## 2011-04-23 LAB — BASIC METABOLIC PANEL
BUN: 10
CO2: 26
Calcium: 8.9
Chloride: 109
Creatinine, Ser: 1.24
GFR calc Af Amer: >60
GFR calc non Af Amer: >60
Glucose, Bld: 93
Potassium: 4.1
Sodium: 140

## 2011-04-23 LAB — POCT CARDIAC MARKERS
CKMB, poc: <1 — ABNORMAL LOW
Myoglobin, poc: 48.7
Operator id: 265201
Troponin i, poc: <0.05

## 2011-04-23 LAB — POCT I-STAT, CHEM 8
BUN: 11
Calcium, Ion: 1.17
Chloride: 107
Creatinine, Ser: 1.4
Glucose, Bld: 95
HCT: 47
Hemoglobin: 16
Potassium: 4.2
Sodium: 142
TCO2: 26

## 2011-04-23 LAB — CBC
HCT: 42.7
Hemoglobin: 13.9
MCHC: 32.5
MCV: 76.9 — ABNORMAL LOW
Platelets: 241
RBC: 5.56
RDW: 15.1
WBC: 11.9 — ABNORMAL HIGH

## 2011-04-23 LAB — URINALYSIS, ROUTINE W REFLEX MICROSCOPIC
Bilirubin Urine: NEGATIVE
Glucose, UA: NEGATIVE
Hgb urine dipstick: NEGATIVE
Ketones, ur: 15 — AB
Nitrite: NEGATIVE
Protein, ur: NEGATIVE
Specific Gravity, Urine: 1.024
Urobilinogen, UA: 0.2
pH: 5.5

## 2011-04-23 LAB — COMPREHENSIVE METABOLIC PANEL
ALT: 15
AST: 20
Albumin: 3.6
Alkaline Phosphatase: 68
BUN: 12
CO2: 26
Calcium: 8.7
Chloride: 107
Creatinine, Ser: 1.23
GFR calc Af Amer: >60
GFR calc non Af Amer: >60
Glucose, Bld: 95
Potassium: 4.3
Sodium: 138
Total Bilirubin: 0.7
Total Protein: 6

## 2011-04-23 LAB — RAPID URINE DRUG SCREEN, HOSP PERFORMED
Amphetamines: NOT DETECTED
Barbiturates: NOT DETECTED
Benzodiazepines: NOT DETECTED
Cocaine: POSITIVE — AB
Opiates: POSITIVE — AB
Tetrahydrocannabinol: NOT DETECTED

## 2011-04-23 LAB — HEMOGLOBIN A1C
Hgb A1c MFr Bld: 5.7
Mean Plasma Glucose: 126

## 2011-04-23 LAB — DIFFERENTIAL
Basophils Absolute: 0
Basophils Relative: 0
Eosinophils Absolute: 0.2
Eosinophils Relative: 2
Lymphocytes Relative: 20
Lymphs Abs: 2.4
Monocytes Absolute: 0.8
Monocytes Relative: 6
Neutro Abs: 8.6 — ABNORMAL HIGH
Neutrophils Relative %: 72

## 2011-04-23 LAB — PROTIME-INR
INR: 0.9
Prothrombin Time: 12.7

## 2011-04-23 LAB — URINE CULTURE
Colony Count: NO GROWTH
Culture: NO GROWTH

## 2011-04-23 LAB — TSH: TSH: 1.57

## 2011-04-23 LAB — B-NATRIURETIC PEPTIDE (CONVERTED LAB): Pro B Natriuretic peptide (BNP): <30

## 2011-04-24 LAB — BASIC METABOLIC PANEL
BUN: 11
BUN: 8
BUN: 8
BUN: 9
BUN: 9
CO2: 26
CO2: 28
CO2: 29
CO2: 31
CO2: 31
Calcium: 9
Calcium: 9.1
Calcium: 9.1
Calcium: 9.2
Calcium: 9.2
Chloride: 104
Chloride: 97
Chloride: 98
Chloride: 98
Chloride: 99
Creatinine, Ser: 1.2
Creatinine, Ser: 1.2
Creatinine, Ser: 1.21
Creatinine, Ser: 1.22
Creatinine, Ser: 1.29
GFR calc Af Amer: >60
GFR calc Af Amer: >60
GFR calc Af Amer: >60
GFR calc Af Amer: >60
GFR calc Af Amer: >60
GFR calc non Af Amer: 60 — ABNORMAL LOW
GFR calc non Af Amer: >60
GFR calc non Af Amer: >60
GFR calc non Af Amer: >60
GFR calc non Af Amer: >60
Glucose, Bld: 100 — ABNORMAL HIGH
Glucose, Bld: 102 — ABNORMAL HIGH
Glucose, Bld: 118 — ABNORMAL HIGH
Glucose, Bld: 96
Glucose, Bld: 97
Potassium: 3.9
Potassium: 4.1
Potassium: 4.1
Potassium: 4.2
Potassium: 4.4
Sodium: 136
Sodium: 137
Sodium: 137
Sodium: 138
Sodium: 140

## 2011-04-24 LAB — CARDIAC PANEL(CRET KIN+CKTOT+MB+TROPI)
CK, MB: 1.1
CK, MB: 1.2
CK, MB: 1.6
Relative Index: 0.5
Relative Index: 0.5
Relative Index: 0.6
Total CK: 239 — ABNORMAL HIGH
Total CK: 253 — ABNORMAL HIGH
Total CK: 279 — ABNORMAL HIGH
Troponin I: 0.01
Troponin I: 0.01
Troponin I: <0.01

## 2011-04-24 LAB — CBC
HCT: 42
HCT: 42.7
HCT: 43.5
HCT: 43.6
HCT: 44.6
Hemoglobin: 13.6
Hemoglobin: 13.9
Hemoglobin: 13.9
Hemoglobin: 13.9
Hemoglobin: 14.8
MCHC: 31.8
MCHC: 31.8
MCHC: 32
MCHC: 33.1
MCHC: 33.1
MCV: 77.1 — ABNORMAL LOW
MCV: 77.2 — ABNORMAL LOW
MCV: 77.4 — ABNORMAL LOW
MCV: 77.4 — ABNORMAL LOW
MCV: 78
Platelets: 229
Platelets: 236
Platelets: 244
Platelets: 261
Platelets: 274
RBC: 5.43
RBC: 5.53
RBC: 5.59
RBC: 5.63
RBC: 5.79
RDW: 14.3
RDW: 14.6
RDW: 14.6
RDW: 14.7
RDW: 15.1
WBC: 10.2
WBC: 11.6 — ABNORMAL HIGH
WBC: 12.8 — ABNORMAL HIGH
WBC: 5.4
WBC: 9.7

## 2011-04-24 LAB — LIPID PANEL
Cholesterol: 190
HDL: 36 — ABNORMAL LOW
LDL Cholesterol: 91
Total CHOL/HDL Ratio: 5.3
Triglycerides: 317 — ABNORMAL HIGH
VLDL: 63 — ABNORMAL HIGH

## 2011-07-17 ENCOUNTER — Ambulatory Visit: Payer: No Typology Code available for payment source | Admitting: Gastroenterology

## 2011-07-30 NOTE — Progress Notes (Signed)
Addended by: Neomia Dear on: 07/30/2011 10:35 AM   Modules accepted: Orders

## 2011-10-07 ENCOUNTER — Ambulatory Visit (INDEPENDENT_AMBULATORY_CARE_PROVIDER_SITE_OTHER): Payer: Medicare Other | Admitting: Internal Medicine

## 2011-10-07 ENCOUNTER — Encounter: Payer: Self-pay | Admitting: Gastroenterology

## 2011-10-07 ENCOUNTER — Encounter: Payer: Self-pay | Admitting: Internal Medicine

## 2011-10-07 VITALS — BP 145/87 | HR 70 | Temp 99.2°F | Resp 20 | Ht 70.5 in | Wt 212.3 lb

## 2011-10-07 DIAGNOSIS — B1920 Unspecified viral hepatitis C without hepatic coma: Secondary | ICD-10-CM

## 2011-10-07 DIAGNOSIS — E291 Testicular hypofunction: Secondary | ICD-10-CM

## 2011-10-07 DIAGNOSIS — I10 Essential (primary) hypertension: Secondary | ICD-10-CM

## 2011-10-07 DIAGNOSIS — Z0000 Encounter for general adult medical examination without abnormal findings: Secondary | ICD-10-CM

## 2011-10-07 DIAGNOSIS — R030 Elevated blood-pressure reading, without diagnosis of hypertension: Secondary | ICD-10-CM

## 2011-10-07 DIAGNOSIS — E785 Hyperlipidemia, unspecified: Secondary | ICD-10-CM

## 2011-10-07 DIAGNOSIS — E349 Endocrine disorder, unspecified: Secondary | ICD-10-CM | POA: Insufficient documentation

## 2011-10-07 DIAGNOSIS — R5381 Other malaise: Secondary | ICD-10-CM

## 2011-10-07 DIAGNOSIS — R5383 Other fatigue: Secondary | ICD-10-CM | POA: Insufficient documentation

## 2011-10-07 MED ORDER — LOSARTAN POTASSIUM 50 MG PO TABS: 50.0000 mg | ORAL_TABLET | Freq: Every day | ORAL | Status: DC

## 2011-10-07 NOTE — Progress Notes (Signed)
Patient ID: Jerome Adams, male   DOB: 1961/03/03, 51 y.o.   MRN: [id number]  Patient is a 51 year old male, with a past medical history of hypertension, chronic lower back pain, depression, and erectile dysfunction. Presents to the outpatient clinic for routine followup. Complains of persistent fatigue which has been worked up in the past with normal CBC, TSH. Patient denies any depression, does report that he snores at night heavily and has been told that he has pauses for a few seconds during sleep. denies CP or SOB. He states that he has not been fully compliant with all of his medications. And has run out of his losartan.   Review of Systems  All other systems reviewed and are negative.    Objective:   Physical Exam  Constitutional: He is oriented to person, place, and time. He appears well-developed and well-nourished.  HENT:  Head: Normocephalic and atraumatic.  Eyes: Pupils are equal, round, and reactive to light.  Neck: Normal range of motion. No JVD present. No thyromegaly present.  Cardiovascular: Normal rate, regular rhythm and normal heart sounds.  Pulmonary/Chest: Effort normal and breath sounds normal. He has no wheezes. He has no rales.  Abdominal: Soft. Bowel sounds are normal. There is no tenderness. There is no rebound.  Musculoskeletal: Normal range of motion. He exhibits no edema.  Neurological: He is alert and oriented to person, place, and time.  Skin: Skin is warm and dry.   Patient Active Problem List  Diagnoses  . ERECTILE DYSFUNCTION, NON-ORGANIC  . NARCOTIC ABUSE  . GERD  . DEGENERATIVE DISC DISEASE, LUMBAR SPINE, WITH MYELOPATHY  . ELEVATED BLOOD PRESSURE  . Routine adult health maintenance  . HCV (hepatitis C virus)  . Low testosterone  . Fatigue     Current Outpatient Prescriptions on File Prior to Visit  Medication Sig Dispense Refill  . albuterol (VENTOLIN HFA) 108 (90 BASE) MCG/ACT inhaler Inhale 2 puffs into the lungs every 6 (six) hours as  needed. For cough       . cetirizine (ZYRTEC ALLERGY) 10 MG tablet Take 10 mg by mouth daily.        . diphenhydrAMINE (BENADRYL) 25 MG tablet Take 25 mg by mouth 2 (two) times daily as needed.        . gabapentin (NEURONTIN) 300 MG capsule Take 300 mg by mouth 2 (two) times daily. For numbness       . hydrochlorothiazide 25 MG tablet Take 1 tablet (25 mg total) by mouth daily.  90 tablet  1  . meclizine (ANTIVERT) 12.5 MG tablet Take 12.5 mg by mouth every 6 (six) hours as needed. For dizziness       . pantoprazole (PROTONIX) 40 MG tablet Take 40 mg by mouth daily.        . sertraline (ZOLOFT) 100 MG tablet Take 100 mg by mouth daily.        . sildenafil (VIAGRA) 100 MG tablet Take 100 mg by mouth daily as needed. Take one hour before sex       . Tamsulosin HCl (FLOMAX) 0.4 MG CAPS Take 0.4 mg by mouth daily.        . Testosterone (ANDROGEL) 25 MG/2.5GM GEL Place 1 packet onto the skin 1 day or 1 dose.  30 Package  1  . traMADol (ULTRAM) 50 MG tablet Take 50 mg by mouth every 4 (four) hours as needed.          Allergies  Allergen Reactions  . Hydrocodone-Acetaminophen  REACTION: Itching

## 2011-10-07 NOTE — Assessment & Plan Note (Signed)
Referral made for screening colonoscopy

## 2011-10-07 NOTE — Patient Instructions (Signed)
--   Please take your medications as prescribed.

## 2011-10-07 NOTE — Assessment & Plan Note (Signed)
Positive antibody with negative RNA titers, therefore patient has likely cleared this infection

## 2011-10-07 NOTE — Assessment & Plan Note (Signed)
Patient's blood pressure slightly elevated, this is due to patient not taking losartan, prescription provided

## 2011-10-07 NOTE — Assessment & Plan Note (Signed)
Testosterone level is borderline, her vision would like to try testosterone due to his erectile dysfunction.

## 2011-10-07 NOTE — Assessment & Plan Note (Signed)
Patient had workup in the past with normal CBC, TSH, and lack of depression symptoms. His symptoms were attributed to low testosterone but now patient reports symptoms of snoring and pauses of breathing during sleep. Will refer for sleep study and CPAP if needed

## 2011-10-15 ENCOUNTER — Other Ambulatory Visit: Payer: Medicare Other

## 2011-10-29 ENCOUNTER — Ambulatory Visit (HOSPITAL_BASED_OUTPATIENT_CLINIC_OR_DEPARTMENT_OTHER): Payer: PRIVATE HEALTH INSURANCE | Attending: Internal Medicine | Admitting: Radiology

## 2011-10-29 VITALS — Ht 70.0 in | Wt 205.0 lb

## 2011-10-29 DIAGNOSIS — G4761 Periodic limb movement disorder: Secondary | ICD-10-CM | POA: Insufficient documentation

## 2011-10-29 DIAGNOSIS — R0683 Snoring: Secondary | ICD-10-CM

## 2011-10-29 DIAGNOSIS — G4733 Obstructive sleep apnea (adult) (pediatric): Secondary | ICD-10-CM | POA: Insufficient documentation

## 2011-10-29 DIAGNOSIS — R5383 Other fatigue: Secondary | ICD-10-CM

## 2011-11-04 ENCOUNTER — Ambulatory Visit (AMBULATORY_SURGERY_CENTER): Payer: PRIVATE HEALTH INSURANCE

## 2011-11-04 VITALS — Ht 70.0 in | Wt 207.0 lb

## 2011-11-04 DIAGNOSIS — R197 Diarrhea, unspecified: Secondary | ICD-10-CM

## 2011-11-04 DIAGNOSIS — Z1211 Encounter for screening for malignant neoplasm of colon: Secondary | ICD-10-CM

## 2011-11-04 DIAGNOSIS — R109 Unspecified abdominal pain: Secondary | ICD-10-CM

## 2011-11-04 MED ORDER — PEG-KCL-NACL-NASULF-NA ASC-C 100 G PO SOLR: 1.0000 | Freq: Once | ORAL | Status: AC

## 2011-11-05 ENCOUNTER — Encounter: Payer: Self-pay | Admitting: Internal Medicine

## 2011-11-05 ENCOUNTER — Ambulatory Visit (INDEPENDENT_AMBULATORY_CARE_PROVIDER_SITE_OTHER): Payer: Medicaid Other | Admitting: Internal Medicine

## 2011-11-05 VITALS — BP 141/89 | HR 82 | Temp 98.8°F | Wt 208.0 lb

## 2011-11-05 DIAGNOSIS — A599 Trichomoniasis, unspecified: Secondary | ICD-10-CM | POA: Diagnosis present

## 2011-11-05 MED ORDER — TINIDAZOLE 500 MG PO TABS: 2.0000 g | ORAL_TABLET | Freq: Once | ORAL | Status: AC

## 2011-11-05 NOTE — Progress Notes (Signed)
Patient ID: Jerome Adams, male   DOB: 11/16/1960, 51 y.o.   MRN: [id number]  51 y/o m with pmh listed below comes for Rx for trich because his partner is diagnosed with it No complants like discharge, pain, dysurea, fever etc,.  No other infection diagnosed in the partner  Physical exam Refuses examination today  ROS Constitutional: Denies fever, chills, diaphoresis, appetite change and fatigue.  Respiratory: Denies SOB, DOE, cough, chest tightness,  and wheezing.   Cardiovascular: Denies chest pain, palpitations and leg swelling.  Gastrointestinal: Denies nausea, vomiting, abdominal pain, diarrhea, constipation, blood in stool and abdominal distention.  Skin: Denies pallor, rash and wound.  Neurological: Denies dizziness, light-headedness, numbness and headaches.

## 2011-11-05 NOTE — Patient Instructions (Signed)
Trichomoniasis  Trichomoniasis is an infection, caused by the Trichomonas organism, that affects both women and men. In women, the outer male genitalia and the vagina are affected. In men, the penis is mainly affected, but the prostate and other reproductive organs can also be involved. Trichomoniasis is a sexually transmitted disease (STD) and is most often passed to another person through sexual contact. The majority of people who get trichomoniasis do so from a sexual encounter and are also at risk for other STDs.  CAUSES    Sexual intercourse with an infected partner.   It can be present in swimming pools or hot tubs.  SYMPTOMS    Abnormal gray-green frothy vaginal discharge in women.   Vaginal itching and irritation in women.   Itching and irritation of the area outside the vagina in women.   Penile discharge with or without pain in males.   Inflammation of the urethra (urethritis), causing painful urination.   Bleeding after sexual intercourse.  RELATED COMPLICATIONS   Pelvic inflammatory disease.   Infection of the uterus (endometritis).   Infertility.   Tubal (ectopic) pregnancy.   It can be associated with other STDs, including gonorrhea and chlamydia, hepatitis B, and HIV.  COMPLICATIONS DURING PREGNANCY   Early (premature) delivery.   Premature rupture of the membranes (PROM).   Low birth weight.  DIAGNOSIS    Visualization of Trichomonas under the microscope from the vagina discharge.   Ph of the vagina greater than 4.5, tested with a test tape.   Trich Rapid Test.   Culture of the organism, but this is not usually needed.   It may be found on a Pap test.   Having a "strawberry cervix,"which means the cervix looks very red like a strawberry.  TREATMENT    You may be given medication to fight the infection. Inform your caregiver if you could be or are pregnant. Some medications used to treat the infection should not be taken during pregnancy.   Over-the-counter medications or  creams to decrease itching or irritation may be recommended.   Your sexual partner will need to be treated if infected.  HOME CARE INSTRUCTIONS    Take all medication prescribed by your caregiver.   Take over-the-counter medication for itching or irritation as directed by your caregiver.   Do not have sexual intercourse while you have the infection.   Do not douche or wear tampons.   Discuss your infection with your partner, as your partner may have acquired the infection from you. Or, your partner may have been the person who transmitted the infection to you.   Have your sex partner examined and treated if necessary.   Practice safe, informed, and protected sex.   See your caregiver for other STD testing.  SEEK MEDICAL CARE IF:    You still have symptoms after you finish the medication.   You have an oral temperature above 102 F (38.9 C).   You develop belly (abdominal) pain.   You have pain when you urinate.   You have bleeding after sexual intercourse.   You develop a rash.   The medication makes you sick or makes you throw up (vomit).  Document Released: 01/07/2001 Document Revised: 07/03/2011 Document Reviewed: 02/02/2009  Exit[employer] Patient Information 2012 [employer].

## 2011-11-05 NOTE — Assessment & Plan Note (Signed)
Treat with 2 gm of tinidazole Advised to RTC for workup if has discharge, dysuria, pain or any other symptoms of urethritis

## 2011-11-08 DIAGNOSIS — G4733 Obstructive sleep apnea (adult) (pediatric): Secondary | ICD-10-CM

## 2011-11-08 DIAGNOSIS — G4761 Periodic limb movement disorder: Secondary | ICD-10-CM

## 2011-11-08 NOTE — Procedures (Signed)
NAME:  Jerome Adams, Jerome Adams              ACCOUNT NO.:  [id number]  MEDICAL RECORD NO.:  [id number]          PATIENT TYPE:  OUT  LOCATION:  SLEEP CENTER                 FACILITY:  [facility]  PHYSICIAN:  Melik Blancett D. Maple Hudson, MD, FCCP, FACPDATE OF BIRTH:  Sep [birth year] 1960  DATE OF STUDY:  10/29/2011                           NOCTURNAL POLYSOMNOGRAM  REFERRING PHYSICIAN:  Darnelle Maffucci, MD  REFERRING PHYSICIAN:  Darnelle Maffucci, MD  INDICATION FOR STUDY:  Hypersomnia with sleep apnea.  EPWORTH SLEEPINESS SCORE:  6/24.  BMI 29.4, weight 205 pounds, height 70 inches, neck 17 inches.  MEDICATIONS:  Home medications are charted and reviewed.  SLEEP ARCHITECTURE:  Total sleep time 234.5 minutes with sleep efficiency of 64.4%.  Stage I was 13.2%, stage II 81.2%, stage III absent, REM 5.5% of total sleep time.  Sleep latency 35 minutes, REM latency 276.5 minutes, awake after sleep onset 94.5 minutes. Arousal index 41.4.  Bedtime medication:  None.  RESPIRATORY DATA:  Apnea-hypopnea index (AHI) 13 per hour.  A total of 51 events was scored including 20 obstructive apneas, 1 central apnea, 30 hypopneas.  Events were not positional.  REM AHI 96.9 per hour. There were insufficient early events and sleep to permit application of split protocol CPAP titration on this study night.  OXYGEN DATA:  Loud snoring with oxygen desaturation to a nadir of 83% and mean oxygen saturation through the study of 94.2% on room air.  CARDIAC DATA:  Sinus rhythm with PACs and PVCs.  MOVEMENT-PARASOMNIA:  Frequent limb jerks.  A total of 170 limb jerks were counted of which 13 were associated with arousals or awakenings for periodic limb movement with arousal index of 3.3 per hour.  No bathroom trips.  IMPRESSIONS-RECOMMENDATION: 1. Sleep architecture was significant for reduced time spent in REM.     Several spontaneous awakenings including an interval of sustained     wakefulness between 1:45 and 2:45 a.m.  No bedtime  medications. 2. Mild obstructive sleep apnea/hypopnea syndrome, apnea/hypopnea     index 13 per hour with non-positional events, loud snoring and     oxygen desaturation to a nadir of 83% with mean oxygen saturation     through the study of 94.2% on room air. 3. There was some delay in initiating sleep and insufficient early     respiratory events, preventing protocol requirements for     application of split CPAP titration protocol on this study night.     Consider return for dedicated CPAP titration study if clinically     appropriate. 4. Periodic limb movements with arousal syndrome.  A total of 170 limb     jerks were counted of which 13 were associated with arousals or     awakenings for periodic limb movement with arousal index of 3.3 per     hour.  If this pattern is associated with clinically apparent sleep     disturbance in the home environment, then a therapeutic trial with     specific therapy such as ReQuip or Mirapex might be considered if     appropriate.     Jerome Mckay D. Maple Hudson, MD, FCCP, FACP Diplomate, Biomedical engineer of Sleep Medicine    CDY/MEDQ  D:  11/08/2011 09:20:06  T:  11/08/2011 09:30:23  Job:  [id number]

## 2011-11-13 ENCOUNTER — Telehealth: Payer: Self-pay | Admitting: Gastroenterology

## 2011-11-13 NOTE — Telephone Encounter (Signed)
Pt is having TENS unit placed in back tomorrow 11/15/2011 and wants to know if it will be okay to have colonoscopy as planned on 4/23.  Informed pt that it would be ok to proceed with colonoscopy as planned.  Jerome Adams

## 2011-11-18 ENCOUNTER — Encounter: Payer: Self-pay | Admitting: Gastroenterology

## 2011-11-18 ENCOUNTER — Ambulatory Visit (AMBULATORY_SURGERY_CENTER): Payer: PRIVATE HEALTH INSURANCE | Admitting: Gastroenterology

## 2011-11-18 VITALS — BP 140/95 | HR 61 | Temp 97.9°F | Resp 18 | Ht 70.0 in | Wt 207.0 lb

## 2011-11-18 DIAGNOSIS — D126 Benign neoplasm of colon, unspecified: Secondary | ICD-10-CM

## 2011-11-18 DIAGNOSIS — R197 Diarrhea, unspecified: Secondary | ICD-10-CM

## 2011-11-18 DIAGNOSIS — R109 Unspecified abdominal pain: Secondary | ICD-10-CM

## 2011-11-18 DIAGNOSIS — Z1211 Encounter for screening for malignant neoplasm of colon: Secondary | ICD-10-CM

## 2011-11-18 MED ORDER — SODIUM CHLORIDE 0.9 % IV SOLN: 500.0000 mL | INTRAVENOUS | Status: DC

## 2011-11-18 NOTE — Op Note (Signed)
[facility] [address] [place], [place]  [address]  COLONOSCOPY PROCEDURE REPORT  PATIENT:  Jerome Adams, Jerome Adams  MR#:  [id number] BIRTHDATE:  Apr 13, 1961, 50 yrs. old  GENDER:  male ENDOSCOPIST:  Rachael Fee, MD REF. BY:  Despina Hidden, M.D. PROCEDURE DATE:  11/18/2011 PROCEDURE:  Colonoscopy with snare polypectomy ASA CLASS:  Class II INDICATIONS:  Routine Risk Screening MEDICATIONS:   Fentanyl 75 mcg IV, These medications were titrated to patient response per physician's verbal order, Versed 5 mg IV  DESCRIPTION OF PROCEDURE:   After the risks benefits and alternatives of the procedure were thoroughly explained, informed consent was obtained.  Digital rectal exam was performed and revealed no rectal masses.   The LB [id number] endoscope was introduced through the anus and advanced to the cecum, which was identified by both the appendix and ileocecal valve, without limitations.  The quality of the prep was good..  The instrument was then slowly withdrawn as the colon was fully examined. <<PROCEDUREIMAGES>> FINDINGS:  Two sessile polyps were found, both removed with cold snare, both sent to pathology (jar 1). These were 3-12mm across, located in transverse and descending segments (see image5 and image6).  This was otherwise a normal examination of the colon (see image7, image3, and image2).   Retroflexed views in the rectum revealed no abnormalities. COMPLICATIONS:  None  ENDOSCOPIC IMPRESSION: 1) 2 polyps, both were removed and sent to pathology 2) Otherwise normal examination  RECOMMENDATIONS: 1) If the polyp(s) removed today are proven to be adenomatous (pre-cancerous) polyps, you will need a repeat colonoscopy in 5 years. Otherwise you should continue to follow colorectal cancer screening guidelines for "routine risk" patients with colonoscopy in 10 years. You will receive a letter within 1-2 weeks with the results of your biopsy as well as final  recommendations. Please call my office if you have not received a letter after 3 weeks. 2) Dr. Christella Hartigan' office will get in touch to set up new office evaluation for your chronic upper GI issues.  ______________________________ Rachael Fee, MD  n. eSIGNED:   Rachael Fee at 11/18/2011 12:05 PM  Arneta Cliche, [phone]
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
## 2011-11-18 NOTE — Patient Instructions (Signed)
DR. Christella Hartigan OFFICE WILL CALL TO ARRANGE AN APPOINTMENT TO EVALUATE YOUR CHRONIC UPPER GI ISSUES.  YOU HAD AN ENDOSCOPIC PROCEDURE TODAY AT THE [facility]: Refer to the procedure report that was given to you for any specific questions about what was found during the examination.  If the procedure report does not answer your questions, please call your gastroenterologist to clarify.  If you requested that your care partner not be given the details of your procedure findings, then the procedure report has been included in a sealed envelope for you to review at your convenience later.  YOU SHOULD EXPECT: Some feelings of bloating in the abdomen. Passage of more gas than usual.  Walking can help get rid of the air that was put into your GI tract during the procedure and reduce the bloating. If you had a lower endoscopy (such as a colonoscopy or flexible sigmoidoscopy) you may notice spotting of blood in your stool or on the toilet paper. If you underwent a bowel prep for your procedure, then you may not have a normal bowel movement for a few days.  DIET: Your first meal following the procedure should be a light meal and then it is ok to progress to your normal diet.  A half-sandwich or bowl of soup is an example of a good first meal.  Heavy or fried foods are harder to digest and may make you feel nauseous or bloated.  Likewise meals heavy in dairy and vegetables can cause extra gas to form and this can also increase the bloating.  Drink plenty of fluids but you should avoid alcoholic beverages for 24 hours.  ACTIVITY: Your care partner should take you home directly after the procedure.  You should plan to take it easy, moving slowly for the rest of the day.  You can resume normal activity the day after the procedure however you should NOT DRIVE or use heavy machinery for 24 hours (because of the sedation medicines used during the test).    SYMPTOMS TO REPORT IMMEDIATELY: A gastroenterologist can  be reached at any hour.  During normal business hours, 8:30 AM to 5:00 PM Monday through Friday, call [phone].  After hours and on weekends, please call the GI answering service at [phone] who will take a message and have the physician on call contact you.   Following lower endoscopy (colonoscopy or flexible sigmoidoscopy):  Excessive amounts of blood in the stool  Significant tenderness or worsening of abdominal pains  Swelling of the abdomen that is new, acute  Fever of 100F or higher  Following upper endoscopy (EGD)  Vomiting of blood or coffee ground material  New chest pain or pain under the shoulder blades  Painful or persistently difficult swallowing  New shortness of breath  Fever of 100F or higher  Black, tarry-looking stools  FOLLOW UP: If any biopsies were taken you will be contacted by phone or by letter within the next 1-3 weeks.  Call your gastroenterologist if you have not heard about the biopsies in 3 weeks.  Our staff will call the home number listed on your records the next business day following your procedure to check on you and address any questions or concerns that you may have at that time regarding the information given to you following your procedure. This is a courtesy call and so if there is no answer at the home number and we have not heard from you through the emergency physician on call, we will assume  that you have returned to your regular daily activities without incident.  SIGNATURES/CONFIDENTIALITY: You and/or your care partner have signed paperwork which will be entered into your electronic medical record.  These signatures attest to the fact that that the information above on your After Visit Summary has been reviewed and is understood.  Full responsibility of the confidentiality of this discharge information lies with you and/or your care-partner.

## 2011-11-19 ENCOUNTER — Telehealth: Payer: Self-pay

## 2011-11-19 NOTE — Telephone Encounter (Signed)
  Follow up Call-  Call back number 11/18/2011  Post procedure Call Back phone  # [phone]  Permission to leave phone message Yes     Patient questions:  Do you have a fever, pain , or abdominal swelling? no Pain Score  0 *  Have you tolerated food without any problems? yes  Have you been able to return to your normal activities? yes  Do you have any questions about your discharge instructions: Diet   no Medications  no Follow up visit  no  Do you have questions or concerns about your Care? no  Actions: * If pain score is 4 or above: No action needed, pain <4.

## 2011-11-25 ENCOUNTER — Encounter: Payer: Self-pay | Admitting: Gastroenterology

## 2011-12-12 ENCOUNTER — Ambulatory Visit: Payer: PRIVATE HEALTH INSURANCE | Admitting: Gastroenterology

## 2011-12-15 ENCOUNTER — Telehealth: Payer: Self-pay | Admitting: Gastroenterology

## 2011-12-15 NOTE — Telephone Encounter (Signed)
Message copied by Arna Snipe on Mon Dec 15, 2011 12:12 PM ------      Message from: Donata Duff      Created: Fri Dec 12, 2011  8:54 AM       Do not bill

## 2011-12-24 ENCOUNTER — Emergency Department (HOSPITAL_COMMUNITY)
Admission: EM | Admit: 2011-12-24 | Discharge: 2011-12-24 | Discharge status: Absent without leave | Payer: PRIVATE HEALTH INSURANCE | Source: Home / Self Care | Attending: Emergency Medicine | Admitting: Emergency Medicine

## 2011-12-26 ENCOUNTER — Emergency Department (HOSPITAL_COMMUNITY): Payer: No Typology Code available for payment source

## 2011-12-26 ENCOUNTER — Encounter (HOSPITAL_COMMUNITY): Payer: Self-pay

## 2011-12-26 ENCOUNTER — Emergency Department (HOSPITAL_COMMUNITY)
Admission: EM | Admit: 2011-12-26 | Discharge: 2011-12-26 | Disposition: A | Discharge status: Home / Self Care | Payer: No Typology Code available for payment source | Attending: Emergency Medicine | Admitting: Emergency Medicine

## 2011-12-26 DIAGNOSIS — Z79899 Other long term (current) drug therapy: Secondary | ICD-10-CM | POA: Insufficient documentation

## 2011-12-26 DIAGNOSIS — K219 Gastro-esophageal reflux disease without esophagitis: Secondary | ICD-10-CM | POA: Insufficient documentation

## 2011-12-26 DIAGNOSIS — I10 Essential (primary) hypertension: Secondary | ICD-10-CM | POA: Insufficient documentation

## 2011-12-26 DIAGNOSIS — M545 Low back pain, unspecified: Secondary | ICD-10-CM | POA: Insufficient documentation

## 2011-12-26 DIAGNOSIS — Z87891 Personal history of nicotine dependence: Secondary | ICD-10-CM | POA: Insufficient documentation

## 2011-12-26 MED ORDER — CYCLOBENZAPRINE HCL 10 MG PO TABS: 10.0000 mg | ORAL_TABLET | Freq: Two times a day (BID) | ORAL | Status: AC | PRN

## 2011-12-26 MED ORDER — IBUPROFEN 800 MG PO TABS: 800.0000 mg | ORAL_TABLET | Freq: Three times a day (TID) | ORAL | Status: AC
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
## 2011-12-26 NOTE — ED Provider Notes (Signed)
Medical screening examination/treatment/procedure(s) were performed by non-physician practitioner and as supervising physician I was immediately available for consultation/collaboration.    Nelia Shi, MD 12/26/11 [phone]

## 2012-01-06 NOTE — Progress Notes (Signed)
Addended by: Bufford Spikes on: 01/06/2012 12:12 PM   Modules accepted: Orders

## 2012-01-13 ENCOUNTER — Encounter: Payer: PRIVATE HEALTH INSURANCE | Admitting: Internal Medicine

## 2012-01-20 ENCOUNTER — Telehealth: Payer: Self-pay | Admitting: *Deleted

## 2012-01-20 NOTE — Telephone Encounter (Signed)
Hx of back pain - getting worse.Appt given 01/21/12 9:45AM. Stanton Kidney Kathalene Sporer RN 01/20/12 9:45AM

## 2012-01-21 ENCOUNTER — Ambulatory Visit: Payer: PRIVATE HEALTH INSURANCE | Admitting: Internal Medicine

## 2012-02-23 ENCOUNTER — Encounter: Payer: Self-pay | Admitting: Internal Medicine

## 2012-02-23 ENCOUNTER — Ambulatory Visit (INDEPENDENT_AMBULATORY_CARE_PROVIDER_SITE_OTHER): Payer: PRIVATE HEALTH INSURANCE | Admitting: Internal Medicine

## 2012-02-23 VITALS — BP 132/82 | HR 70 | Temp 99.4°F | Ht 69.0 in | Wt 206.5 lb

## 2012-02-23 DIAGNOSIS — M549 Dorsalgia, unspecified: Secondary | ICD-10-CM

## 2012-02-23 DIAGNOSIS — M5106 Intervertebral disc disorders with myelopathy, lumbar region: Secondary | ICD-10-CM

## 2012-02-23 DIAGNOSIS — A599 Trichomoniasis, unspecified: Secondary | ICD-10-CM

## 2012-02-23 DIAGNOSIS — G5620 Lesion of ulnar nerve, unspecified upper limb: Secondary | ICD-10-CM | POA: Insufficient documentation

## 2012-02-23 MED ORDER — TINIDAZOLE 250 MG PO TABS: 2.0000 g | ORAL_TABLET | Freq: Once | ORAL | Status: AC

## 2012-02-23 NOTE — Progress Notes (Signed)
Patient ID: Jerome Adams, male   DOB: [birth year], 51 y.o.   MRN: [id number]  Subjective:   Patient ID: Jerome Adams male   DOB: 02-11-1961 51 y.o.   MRN: [id number]  HPI: [name] is a 51 y.o. with past medical history as outlined below, who presents for an acute visit.  1.) Patient reports that he started having tingling sensation in his left hand for approximately 2 weeks. It is located at 4th and 5th fingers, at both palm and dorsal sides. He denies any injury to his hand or elbow. There is no weakness in hand or arm. Patient reports having mild neck soreness which started at proximately 5 days ago, currently the neck soreness is very minimal.   2). 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 He had 3 back surgeries in the past on 2009, 2011 and 2012. He has a stimulator in placed last year, which is not effective in controlling his back pain. Currently he still has moderate back pain, 9/10 in severity, aching and radiating to the left lower leg. It is aggravated by standing or movement. He also has decreased sensation in the left medial lower leg and muscle weakness. He does not have urinary incontinence or lose control of bowel movements.   3). Patient reports that his girlfriend was recently diagnosed as trichomoniasis. He was told to come to the clinic and get treated too. Currently he is asymptomatic. He does not have penile discharge.  Denies fever, chills, fatigue, headaches,  cough, chest pain, SOB,  abdominal pain,diarrhea, constipation, dysuria, urgency, frequency, hematuria.       Past Medical History  Diagnosis Date  . Back pain   . PUD (peptic ulcer disease)   . Hypertension   . Allergy   . GERD (gastroesophageal reflux disease)    Current Outpatient Prescriptions  Medication Sig Dispense Refill  . hydrochlorothiazide (HYDRODIURIL) 25 MG tablet Take 25 mg by mouth daily.      [name] losartan (COZAAR) 50 MG  tablet Take 50 mg by mouth daily.      . Multiple Vitamin (MULITIVITAMIN WITH MINERALS) TABS Take 1 tablet by mouth daily.      [name] tinidazole (TINDAMAX) 250 MG tablet Take 8 tablets (2,000 mg total) by mouth once.  8 tablet  0   Family History  Problem Relation Age of Onset  . Heart disease Father   . Heart disease Sister   . Breast cancer Sister    History   Social History  . Marital Status: Divorced    Spouse Name: N/A    Number of Children: N/A  . Years of Education: N/A   Social History Main Topics  . Smoking status: Former Smoker    Types: Cigarettes    Quit date: 03/23/2010  . Smokeless tobacco: Never Used  . Alcohol Use: No     has been one year since any alcohol  . 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 Sexually Active: None   Other Topics Concern  . None   Social History Narrative  . None   Review of Systems:  General: no fevers, chills, no changes in body weight, no changes in appetite Skin: no rash HEENT: no blurry vision, hearing changes or sore throat Pulm: no dyspnea, coughing, wheezing CV: no chest pain, palpitations, shortness of breath Abd: no nausea/vomiting, abdominal pain, diarrhea/constipation  GU: no dysuria, hematuria, polyuria Ext: Has left hand tingling sensations. Has lower back pain. Has decreased sensation and muscle weakness in the left lower leg.  Neuro: has left leg weakness. Has tingling in left hand.   Objective:  Physical Exam: Filed Vitals:   02/23/12 1501  BP: 132/82  Pulse: 70  Temp: 99.4 F (37.4 C)  TempSrc: Oral  Height: 5\' 9"  (1.753 m)  Weight: 206 lb 8 oz (93.668 kg)   General: Not in acute distress HEENT: PERRL, EOMI, no scleral icterus Cardiac: S1/S2, RRR, No murmurs, gallops or rubs Pulm: Good air movement bilaterally, Clear to auscultation bilaterally, No rales, wheezing, rhonchi or rubs. Abd: Soft,  nondistended, nontender, no rebound pain, no organomegaly,  BS present Ext: No rashes or edema, 2+DP/PT pulse bilaterally Musculoskeletal: tenderness over lower back at midline.  Skin: no rashes. No skin bruise. Neuro: alert and oriented X3, cranial nerves II-XII grossly intact, muscle strength is decreased in the left lower leg (4/5), muscle strength is 5 out of 5 in other extremities. Sensation to light touch is decreased in the left medial lower leg. 2+ brachial and knee reflexes bilaterally.  Psych.: patient is not psychotic, no suicidal or hemocidal ideation.   Assessment & Plan:

## 2012-02-23 NOTE — Patient Instructions (Addendum)
1. Please take all medications as prescribed.  2. If you have worsening of your symptoms or new symptoms arise, please call the clinic [phone]), or go to the ER immediately if symptoms are severe. 3. Please try followings:   Please avoid leaning on the elbows when seated or driving and should avoid prolonged elbow flexion.   Use the other hand or a headset when on the telephone and avoiding sitting with the arms crossed.  Wrap the affected elbow with a towel at night to limit flexion.

## 2012-02-23 NOTE — Assessment & Plan Note (Signed)
Patient reports that her girlfriend was diagnosis with trichomoniasis recently. He is currently asymptomatic. We'll treat him with 2 g of tinidazole once.

## 2012-02-23 NOTE — Assessment & Plan Note (Addendum)
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 Etiology is not clear currently. Other differential diagnoses include but less likely, central lesions such as amyotrophic lateral sclerosis (patient does not have weakness or muscle atrophy); and brachial plexus compression (patient numbness is localized to ulnar nerve distribution which is not typical for brachial plexus compression). Patient's symptoms is mild without muscle weakness or muscle atrophy.   -Will treat her conservatively. Patient was advised to do following:   Please avoid leaning on the elbows when seated or driving and should avoid prolonged elbow flexion.   Use the other hand or a headset when on the telephone and avoiding sitting with the arms crossed.  Wrap the affected elbow with a towel at night to limit flexion.   -If the symptoms does not improve in one month, or get worse, will consider to do nerve conduction study.

## 2012-02-23 NOTE — Assessment & Plan Note (Signed)
S/p of 3 surgeries in the past. Patient still has moderate back pain. Stimulator is not very effective in controlling his back pain. Patient does not have alarming symptoms, such as urinary incontinence or lose control of her bowel movements. His leg weakness and decreased sensation are chronic and are at his baseline. We'll give him referral to pain clinic.

## 2012-03-04 ENCOUNTER — Ambulatory Visit (INDEPENDENT_AMBULATORY_CARE_PROVIDER_SITE_OTHER): Payer: PRIVATE HEALTH INSURANCE | Admitting: Internal Medicine

## 2012-03-04 ENCOUNTER — Encounter: Payer: Self-pay | Admitting: Internal Medicine

## 2012-03-04 ENCOUNTER — Telehealth: Payer: Self-pay | Admitting: *Deleted

## 2012-03-04 VITALS — BP 150/86 | HR 80 | Temp 98.2°F | Wt 207.8 lb

## 2012-03-04 DIAGNOSIS — A599 Trichomoniasis, unspecified: Secondary | ICD-10-CM

## 2012-03-04 DIAGNOSIS — R030 Elevated blood-pressure reading, without diagnosis of hypertension: Secondary | ICD-10-CM

## 2012-03-04 DIAGNOSIS — M792 Neuralgia and neuritis, unspecified: Secondary | ICD-10-CM

## 2012-03-04 DIAGNOSIS — M25519 Pain in unspecified shoulder: Secondary | ICD-10-CM

## 2012-03-04 DIAGNOSIS — Z202 Contact with and (suspected) exposure to infections with a predominantly sexual mode of transmission: Secondary | ICD-10-CM

## 2012-03-04 DIAGNOSIS — M25512 Pain in left shoulder: Secondary | ICD-10-CM

## 2012-03-04 LAB — COMPREHENSIVE METABOLIC PANEL
ALT: 23 U/L (ref 0–53)
AST: 18 U/L (ref 0–37)
Albumin: 4.4 g/dL (ref 3.5–5.2)
Alkaline Phosphatase: 71 U/L (ref 39–117)
BUN: 10 mg/dL (ref 6–23)
CO2: 26 mEq/L (ref 19–32)
Calcium: 9.5 mg/dL (ref 8.4–10.5)
Chloride: 105 mEq/L (ref 96–112)
Creat: 1.1 mg/dL (ref 0.50–1.35)
Glucose, Bld: 82 mg/dL (ref 70–99)
Potassium: 4 mEq/L (ref 3.5–5.3)
Sodium: 142 mEq/L (ref 135–145)
Total Bilirubin: 0.8 mg/dL (ref 0.3–1.2)
Total Protein: 7 g/dL (ref 6.0–8.3)

## 2012-03-04 MED ORDER — GABAPENTIN 300 MG PO CAPS: ORAL_CAPSULE | ORAL | Status: DC

## 2012-03-04 MED ORDER — HYDROCHLOROTHIAZIDE 25 MG PO TABS: 25.0000 mg | ORAL_TABLET | Freq: Every day | ORAL | Status: DC

## 2012-03-04 MED ORDER — OXYCODONE HCL 5 MG PO TABS: 5.0000 mg | ORAL_TABLET | Freq: Four times a day (QID) | ORAL | Status: DC | PRN

## 2012-03-04 MED ORDER — TINIDAZOLE 500 MG PO TABS: 2.0000 g | ORAL_TABLET | Freq: Once | ORAL | Status: DC

## 2012-03-04 MED ORDER — TINIDAZOLE 500 MG PO TABS: 2.0000 g | ORAL_TABLET | Freq: Once | ORAL | Status: AC

## 2012-03-04 NOTE — Telephone Encounter (Signed)
Thank you :)

## 2012-03-04 NOTE — Progress Notes (Signed)
Subjective:   Patient ID: Jerome Adams male   DOB: [birth year] 51 y.o.   MRN: [id number]  HPI: [name] is a 51 y.o. man with history of chronic back pain, hypertension, reflux disease, and peptic ulcer disease who presents today for an acute visit. He was seen in clinic on 02/23/2012 for similar symptoms, except that they have worsened.  Today he complains of left arm and neck pain, described as a burning sharp sensation, a 11/10, he has tried Motrin, but has not tried warm compresses or massage. He reports that it's worsening to the point where for the last 2 nights he woke up in pain. He was initially going to emergency room, but call here first for an appointment. He denies any trauma when pain started, which was on 02/23/2012. He notes that pain is similar to when he had shooting pain going down his legs from his back problems.  He also reports that he was given a prescription for treatment of Trichomonas one week ago, but he lost it. He requests another prescription. He denies dysuria, discharge, or penile pain.  Regarding his hypertension, he has been compliant with losartan, but he's been out of the hydrochlorothiazide for the last month, and requests refills.  He requests all of his prescriptions be printed.  Past Medical History  Diagnosis Date  . Back pain   . PUD (peptic ulcer disease)   . Hypertension   . Allergy   . GERD (gastroesophageal reflux disease)    Current Outpatient Prescriptions  Medication Sig Dispense Refill  . losartan (COZAAR) 50 MG tablet Take 50 mg by mouth daily.      . Multiple Vitamin (MULITIVITAMIN WITH MINERALS) TABS Take 1 tablet by mouth daily.      [name] gabapentin (NEURONTIN) 300 MG capsule Take 300mg  daily for 1d, then 300 mg twice daily for 1 day, then 300mg  three times daily  90 capsule  2  . hydrochlorothiazide (HYDRODIURIL) 25 MG tablet Take 1 tablet (25 mg total) by mouth daily.  30 tablet  5  . oxyCODONE (ROXICODONE) 5 MG  immediate release tablet Take 1 tablet (5 mg total) by mouth every 6 (six) hours as needed for pain.  20 tablet  0  . tinidazole (TINDAMAX) 500 MG tablet Take 4 tablets (2,000 mg total) by mouth once.  4 tablet  0  . DISCONTD: hydrochlorothiazide (HYDRODIURIL) 25 MG tablet Take 25 mg by mouth daily.      [name] DISCONTD: hydrochlorothiazide (HYDRODIURIL) 25 MG tablet Take 1 tablet (25 mg total) by mouth daily.  30 tablet  5   Family History  Problem Relation Age of Onset  . Heart disease Father   . Heart disease Sister   . Breast cancer Sister    History   Social History  . Marital Status: Divorced    Spouse Name: N/A    Number of Children: N/A  . Years of Education: N/A   Social History Main Topics  . Smoking status: Former Smoker    Types: Cigarettes    Quit date: 03/23/2010  . Smokeless tobacco: Never Used  . Alcohol Use: No     has been one year since any alcohol  . 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 Sexually Active: None   Other Topics Concern  . None   Social History Narrative  . None   Review of Systems: General:  no fevers, chills, changes in weight, changes in appetite Skin: no rash HEENT: no blurry vision, hearing changes, sore throat Pulm: no dyspnea, coughing, wheezing CV: no chest pain, palpitations, shortness of breath Abd: no abdominal pain, nausea/vomiting, diarrhea/constipation GU: no dysuria, hematuria, polyuria Ext: no myalgias   Objective:  Physical Exam: Filed Vitals:   03/04/12 1457  BP: 150/86  Pulse: 80  Temp: 98.2 F (36.8 C)  TempSrc: Oral  Weight: 207 lb 12.8 oz (94.257 kg)   Constitutional: Vital signs reviewed.  Patient is a well-developed and well-nourished man in no acute distress and cooperative with exam Mouth: no erythema or exudates, MMM Eyes: PERRL, EOMI, conjunctivae normal, No scleral icterus.  Neck: Supple, Trachea midline normal ROM, No JVD, mass, thyromegaly, or  carotid bruit present.  Cardiovascular: RRR, S1 normal, S2 normal, no MRG, pulses symmetric and intact bilaterally Pulmonary/Chest: CTAB, no wheezes, rales, or rhonchi Abdominal: Soft. Non-tender, non-distended, bowel sounds are normal, no masses, organomegaly, or guarding present.  Musculoskeletal: Decreased abduction of left arm to about 100; left shoulder internal and external rotation limited by pain; no palpable crepitus; weakness of left arm when testing resistance to elbow flexion and shoulder adduction ; full-strength grip bilaterally ; when head is turned to the left, and I applied pressure to the top of his head, he does experience neck pain.  Neurological: A&O x3, Strength is normal and symmetric bilaterally, cranial nerve II-XII are grossly intact, no focal motor deficit, sensory intact to light touch bilaterally.  Skin: Warm, dry and intact. No rash, cyanosis, or clubbing.   Assessment & Plan:   Case and care discussed with Dr. Rogelia Boga. Please see problem-oriented charting for further details. Patient to return next month for followup with his PCP

## 2012-03-04 NOTE — Patient Instructions (Addendum)
-  I am starting Gabapentin - Take 300mg  daily for 1d, then 300 mg twice daily for 1 day, then 300mg  three times daily  -I am prescribing oxycodone to help with your pain until the gabapentin kicks in.  This is a short course of pain medication.  Once you are enrolled at the pain clinic, they will manage your pain.  Be sure to keep that appointment.  -If your pain worsens or persists, please call the clinic so that we can order imaging.  Please be sure to bring all of your medications with you to every visit.  Should you have any new or worsening symptoms, please be sure to call the clinic at [phone].

## 2012-03-04 NOTE — Telephone Encounter (Signed)
Pt calls stating his shoulder pain is worse from last appt, can be here in 5 mins, appt sch per chilonb. For 1445

## 2012-03-05 DIAGNOSIS — M25512 Pain in left shoulder: Secondary | ICD-10-CM | POA: Insufficient documentation

## 2012-03-05 NOTE — Assessment & Plan Note (Signed)
Given another prescription of 2 g of tinidazole once

## 2012-03-05 NOTE — Assessment & Plan Note (Signed)
I suspect that patient may be experiencing a cervical disc herniation. Pain is still very acute. We'll start gabapentin, and I will fill a short course of oxycodone to manage his acute pain. This is not to become chronic management of his shoulder pain. If pain does not subside, we will require an MRI of the cervical spine. Patient tells me he has an appointment with pain clinic on 03/16/2012. After August 20, we should not manage his pain.  Apparently his history of narcotic abuse per chart review is because he was taking cocaine while being given narcotics.

## 2012-03-08 ENCOUNTER — Other Ambulatory Visit (HOSPITAL_COMMUNITY): Payer: Self-pay | Admitting: Emergency Medicine

## 2012-03-08 ENCOUNTER — Emergency Department (HOSPITAL_COMMUNITY): Payer: PRIVATE HEALTH INSURANCE

## 2012-03-08 ENCOUNTER — Emergency Department (HOSPITAL_COMMUNITY)
Admission: EM | Admit: 2012-03-08 | Discharge: 2012-03-08 | Disposition: A | Discharge status: Home / Self Care | Payer: PRIVATE HEALTH INSURANCE | Attending: Emergency Medicine | Admitting: Emergency Medicine

## 2012-03-08 ENCOUNTER — Encounter (HOSPITAL_COMMUNITY): Payer: Self-pay | Admitting: *Deleted

## 2012-03-08 DIAGNOSIS — R209 Unspecified disturbances of skin sensation: Secondary | ICD-10-CM | POA: Insufficient documentation

## 2012-03-08 DIAGNOSIS — M542 Cervicalgia: Secondary | ICD-10-CM | POA: Insufficient documentation

## 2012-03-08 DIAGNOSIS — M4802 Spinal stenosis, cervical region: Secondary | ICD-10-CM | POA: Insufficient documentation

## 2012-03-08 DIAGNOSIS — R5381 Other malaise: Secondary | ICD-10-CM | POA: Insufficient documentation

## 2012-03-08 DIAGNOSIS — M25519 Pain in unspecified shoulder: Secondary | ICD-10-CM | POA: Insufficient documentation

## 2012-03-08 DIAGNOSIS — I10 Essential (primary) hypertension: Secondary | ICD-10-CM | POA: Insufficient documentation

## 2012-03-08 MED ORDER — DEXAMETHASONE SODIUM PHOSPHATE 10 MG/ML IJ SOLN: 10.0000 mg | Freq: Once | INTRAMUSCULAR | Status: DC

## 2012-03-08 MED ORDER — DEXAMETHASONE SODIUM PHOSPHATE 10 MG/ML IJ SOLN
10.0000 mg | Freq: Once | INTRAMUSCULAR | Status: AC
  Administered 2012-03-08: 10 mg via INTRAMUSCULAR
  Filled 2012-03-08: qty 1

## 2012-03-08 MED ORDER — IBUPROFEN 200 MG PO TABS
600.0000 mg | ORAL_TABLET | Freq: Once | ORAL | Status: AC
  Administered 2012-03-08: 600 mg via ORAL
  Filled 2012-03-08: qty 3

## 2012-03-08 MED ORDER — METHYLPREDNISOLONE 4 MG PO KIT: PACK | ORAL | Status: DC

## 2012-03-08 MED ORDER — OXYCODONE-ACETAMINOPHEN 5-325 MG PO TABS: 2.0000 | ORAL_TABLET | Freq: Four times a day (QID) | ORAL | Status: DC | PRN

## 2012-03-08 MED ORDER — HYDROMORPHONE HCL 2 MG PO TABS
2.0000 mg | ORAL_TABLET | ORAL | Status: DC | PRN
  Administered 2012-03-08: 2 mg via ORAL
  Filled 2012-03-08: qty 1

## 2012-03-08 MED ORDER — HYDROMORPHONE HCL PF 2 MG/ML IJ SOLN
2.0000 mg | Freq: Once | INTRAMUSCULAR | Status: AC
  Administered 2012-03-08: 2 mg via INTRAMUSCULAR
  Filled 2012-03-08: qty 1

## 2012-03-08 MED ORDER — IBUPROFEN 600 MG PO TABS: 600.0000 mg | ORAL_TABLET | Freq: Four times a day (QID) | ORAL | Status: DC | PRN

## 2012-03-08 MED ORDER — CYCLOBENZAPRINE HCL 10 MG PO TABS: 10.0000 mg | ORAL_TABLET | Freq: Two times a day (BID) | ORAL | Status: DC | PRN

## 2012-03-08 NOTE — ED Provider Notes (Addendum)
History     CSN: [id number]  Arrival date & time 03/08/12  2841   First MD Initiated Contact with Patient 03/08/12 0805      Chief Complaint  Patient presents with  . Shoulder Pain  . Neck Pain    (Consider location/radiation/quality/duration/timing/severity/associated sxs/prior treatment) HPI Comments: [name] is a 51 y.o. man with history of chronic back pain, hypertension, reflux disease, and peptic ulcer disease who presents with cc of neck pain, shoulder pain and some tingling. Pt reports having some tingling sensation in his finger (small and half of ring finger) for the past 3 weeks, but overtime he has  Started having some neck pain and shoulder pain as well. The pain is located in the middle of the neck, and radiates down the entire extremity and is burning in sensation - similar to his lower leg pain in the past. The pain is more constant now, and is worse with certain movement. Pt also appreciates a little weakness now - he has noted that gripping a pan is not as reassuring to him as it was before. He is right handed, and has no sx on the right side. No hx of neck trauma, known cervical spine DJD. Pt sae [facility] few days ago, and reportedly there was a concern for disk disease and MRI considered.    Patient is a 51 y.o. male presenting with shoulder pain and neck pain. The history is provided by the patient.  Shoulder Pain Pertinent negatives include no chest pain, no abdominal pain and no shortness of breath.  Neck Pain  Associated symptoms include numbness and weakness. Pertinent negatives include no chest pain.    Past Medical History  Diagnosis Date  . Back pain   . PUD (peptic ulcer disease)   . Hypertension   . Allergy   . GERD (gastroesophageal reflux disease)     Past Surgical History  Procedure Date  . Back surgery     fusion  . Wrist surgery right    repair of tendons and nerves  . Foot surgery     right  . Spinal cord stimulator implant  11/14/2012    Family History  Problem Relation Age of Onset  . Heart disease Father   . Heart disease Sister   . Breast cancer Sister     History  Substance Use Topics  . Smoking status: Former Smoker    Types: Cigarettes    Quit date: 03/23/2010  . Smokeless tobacco: Never Used  . Alcohol Use: No     has been one year since any alcohol      Review of Systems  Constitutional: Negative for activity change and appetite change.  HENT: Positive for neck pain.   Respiratory: Negative for cough and shortness of breath.   Cardiovascular: Negative for chest pain.  Gastrointestinal: Negative for abdominal pain.  Genitourinary: Negative for dysuria.  Musculoskeletal: Positive for back pain.  Neurological: Positive for weakness and numbness. Negative for tremors.    Allergies  Hydrocodone-acetaminophen  Home Medications   Current Outpatient Rx  Name Route Sig Dispense Refill  . GABAPENTIN 300 MG PO CAPS  Take 300mg  daily for 1d, then 300 mg twice daily for 1 day, then 300mg  three times daily 90 capsule 2  . HYDROCHLOROTHIAZIDE 25 MG PO TABS Oral Take 1 tablet (25 mg total) by mouth daily. 30 tablet 5  . LOSARTAN POTASSIUM 50 MG PO TABS Oral Take 50 mg by mouth daily.    [name]  ADULT MULTIVITAMIN W/MINERALS CH Oral Take 1 tablet by mouth daily.    . OXYCODONE HCL 5 MG PO TABS Oral Take 1 tablet (5 mg total) by mouth every 6 (six) hours as needed for pain. 20 tablet 0  . TINIDAZOLE 500 MG PO TABS Oral Take 4 tablets (2,000 mg total) by mouth once. 4 tablet 0    BP 138/69  Pulse 85  Temp 98.6 F (37 C) (Oral)  Resp 20  SpO2 97%  Physical Exam  Constitutional: He is oriented to person, place, and time. He appears well-developed.  HENT:  Head: Normocephalic and atraumatic.  Eyes: Conjunctivae and EOM are normal. Pupils are equal, round, and reactive to light.  Neck: Normal range of motion. Neck supple.       C4 - C6 midline tenderness  Cardiovascular: Normal rate and regular  rhythm.   Pulmonary/Chest: Effort normal and breath sounds normal.  Abdominal: Soft. Bowel sounds are normal. He exhibits no distension. There is no tenderness. There is no rebound and no guarding.  Neurological: He is alert and oriented to person, place, and time.       Over the ulnar half of ring finger and small finger, patient unable to discriminate between sharp and dull. Pt has intact sensation else where, and similar to the contralateral side. Pt's grip strength is slightly diminished on the right side. ROM of the shoulder is intact.   Skin: Skin is warm.    ED Course  Procedures (including critical care time)  Labs Reviewed - No data to display No results found.   No diagnosis found.    MDM  DDX: DJD of the neck Nerve compression Herniated Disk Neuropathy Brachial plexopathy  A/P: Pt comes in with cc of neck pain and LUE pain. Pt has associated weakness and sensory deficit over the ring and small finger. Concerns primarily for cervical spine disease with nerve impingement leading to neuropathy. The sensory deficit is unusual in location.....but is possible. We wil lget C-spine CT extending down to T3. We will give some pain meds. Pt is NOT A CANDIDATE FOR MRI - he has a stimulator in the back with previous surgery.    Derwood Kaplan, MD 03/08/12 0912  11:47 AM Dr. Wynetta Emery from [facility] evaluated the patient. Recommending Myelogram and followup with pain control and steroids.  Derwood Kaplan, MD 03/08/12 1147

## 2012-03-08 NOTE — ED Notes (Signed)
Pt c/o L shoulder and neck pain x5-6 days, worse with movement. Has been taking neurotin for pain, has been seen for same.

## 2012-03-08 NOTE — Consult Note (Signed)
Reason for Consult: Neck and left shoulder and arm pain Referring Physician: Emergency was along  Jerome Adams is an 51 y.o. male.  HPI: Patient is a 51 year old Jerome Adams had long-standing history of chronic low back and underwent previous lumbar fusion subsequent spinal cord stimulator placement supposedly this was done [facility] back in 2008. Over last 6 weeks has been experiencing worsening numbness in the last finger of his left hand as well as ring finger and is experiencing pain it is note is left shoulder and arm for the last week or 2. He has noticed some weakness when trying to lift up frying pans and attained given way on him. He denies any right arm symptoms he has had an old history of an ulnar nerve injury at [place] that has left him with pain intrinsic weakness and atrophy. He denies any walking difficulty denies any unsteadiness. He has been on oxycodone that the pain is been refractory to that. He is seen his primary medical doctor at the outpatient clinic. Past Medical History  Diagnosis Date  . Back pain   . PUD (peptic ulcer disease)   . Hypertension   . Allergy   . GERD (gastroesophageal reflux disease)     Past Surgical History  Procedure Date  . Back surgery     fusion  . Wrist surgery right    repair of tendons and nerves  . Foot surgery     right  . Spinal cord stimulator implant 11/14/2012    Family History  Problem Relation Age of Onset  . Heart disease Father   . Heart disease Sister   . Breast cancer Sister     Social History:  reports that he quit smoking about 1 years ago. His smoking use included Cigarettes. He has never used smokeless tobacco. He reports that he uses illicit drugs (Cocaine). He reports that he does not drink alcohol.  Allergies:  Allergies  Allergen Reactions  . Hydrocodone-Acetaminophen Itching    Medications: I have reviewed the patient's current medications.  No results found for  this or any previous visit (from the past 48 hour(s)).  Ct Cervical Spine Wo Contrast  03/08/2012  *RADIOLOGY REPORT*  Clinical Data: Shoulder pain and tingling.  Left upper extremity weakness.  CT CERVICAL SPINE WITHOUT CONTRAST  Technique:  Multidetector CT imaging of the cervical spine was performed. Multiplanar CT image reconstructions were also generated.  Comparison: None.  Findings: No acute fracture.  No dislocation.  There is stranding of the normally lordotic cervical spine.  Otherwise anatomic alignment without anterolisthesis or post II listhesis. An element of congenital spinal stenosis is noted.  C3-4:  Central posterior osteophyte and disc complex causes significant spinal stenosis.  C5-6:  Prominent left posterior disc osteophytes and disc bulge complex causes significant spinal stenosis.  Less prominent degenerative change at the other levels.  IMPRESSION: No acute bony pathology.  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  At C5-6, these findings could certainly result and left shoulder symptomatology.  Cervical spine MRI can be performed to further delineate.  Original Report Authenticated By: Donavan Burnet, M.D.    @ROS @ Blood pressure 138/69, pulse 85, temperature 98.6 F (37 C), temperature source Oral, resp. rate 20, SpO2 97.00%. Strength is 5 out of 5 in his deltoids, biceps, triceps, wrist flexion extension and hand intrinsics on the left the rupture he has some weakness of 4-4+ his hand intrinsics on  the right. Lower extremity strength appears 5 out of 5.   Assessment/Plan: 51 year old gentleman presents with neck and left arm pain assistant with a C6 nerve root pattern he also has numbness tingling left arm consistent with an ulnar neuropathy. Not never had an EMG nerve conduction test to rule out ulnar neuropathy in the left arm he was apparently diagnosed with a clinically about 6 weeks ago when he saw his primary medical doctor at  the outpatient clinic. 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 Due to his mixed symptoms of both cervical and C6 and possibly C5 foraminal stenosis in addition to ulnar neuropathy I recommended a CT myelogram we will put perform this as an outpatient. C. 10 mg of Decadron IM in the ER we'll send recommend we send him home on a Medrol Dosepak as well as oxycodone 10 x 3 25 one to 2 every 4-6 and Flexeril as a muscle relaxer. We'll arrange outpatient followup to review his myelogram after it's done. It is very likely the patient will require an anterior cervical skin effusion it is also possible afterward EMG nerve conduction test depending on the results of the myelogram.  Jerome Adams P 03/08/2012, 11:45 AM

## 2012-03-10 ENCOUNTER — Ambulatory Visit
Admission: RE | Admit: 2012-03-10 | Discharge: 2012-03-10 | Disposition: A | Discharge status: Home / Self Care | Payer: PRIVATE HEALTH INSURANCE | Source: Ambulatory Visit | Attending: Emergency Medicine | Admitting: Emergency Medicine

## 2012-03-10 VITALS — BP 141/95 | HR 63

## 2012-03-10 DIAGNOSIS — M25512 Pain in left shoulder: Secondary | ICD-10-CM

## 2012-03-10 DIAGNOSIS — M4802 Spinal stenosis, cervical region: Secondary | ICD-10-CM

## 2012-03-10 MED ORDER — ONDANSETRON HCL 4 MG/2ML IJ SOLN
4.0000 mg | Freq: Once | INTRAMUSCULAR | Status: AC
  Administered 2012-03-10: 4 mg via INTRAMUSCULAR

## 2012-03-10 MED ORDER — DIAZEPAM 5 MG PO TABS
10.0000 mg | ORAL_TABLET | Freq: Once | ORAL | Status: AC
  Administered 2012-03-10: 10 mg via ORAL

## 2012-03-10 MED ORDER — IOHEXOL 300 MG/ML  SOLN
9.0000 mL | Freq: Once | INTRAMUSCULAR | Status: AC | PRN
  Administered 2012-03-10: 9 mL via INTRATHECAL

## 2012-03-10 MED ORDER — MEPERIDINE HCL 100 MG/ML IJ SOLN
100.0000 mg | Freq: Once | INTRAMUSCULAR | Status: AC
  Administered 2012-03-10: 100 mg via INTRAMUSCULAR

## 2012-03-12 ENCOUNTER — Other Ambulatory Visit (HOSPITAL_COMMUNITY): Payer: PRIVATE HEALTH INSURANCE

## 2012-03-12 ENCOUNTER — Encounter (HOSPITAL_COMMUNITY): Payer: Self-pay | Admitting: *Deleted

## 2012-03-12 ENCOUNTER — Emergency Department (HOSPITAL_COMMUNITY)
Admission: EM | Admit: 2012-03-12 | Discharge: 2012-03-12 | Disposition: A | Discharge status: Home / Self Care | Payer: PRIVATE HEALTH INSURANCE | Attending: Emergency Medicine | Admitting: Emergency Medicine

## 2012-03-12 DIAGNOSIS — I10 Essential (primary) hypertension: Secondary | ICD-10-CM | POA: Insufficient documentation

## 2012-03-12 DIAGNOSIS — K219 Gastro-esophageal reflux disease without esophagitis: Secondary | ICD-10-CM | POA: Insufficient documentation

## 2012-03-12 DIAGNOSIS — M542 Cervicalgia: Secondary | ICD-10-CM

## 2012-03-12 DIAGNOSIS — M25519 Pain in unspecified shoulder: Secondary | ICD-10-CM | POA: Insufficient documentation

## 2012-03-12 DIAGNOSIS — Z79899 Other long term (current) drug therapy: Secondary | ICD-10-CM | POA: Insufficient documentation

## 2012-03-12 MED ORDER — HYDROMORPHONE HCL PF 2 MG/ML IJ SOLN
2.0000 mg | Freq: Once | INTRAMUSCULAR | Status: AC
  Administered 2012-03-12: 2 mg via INTRAVENOUS
  Filled 2012-03-12: qty 1

## 2012-03-12 MED ORDER — HYDROMORPHONE HCL 4 MG PO TABS: 4.0000 mg | ORAL_TABLET | ORAL | Status: AC | PRN

## 2012-03-12 NOTE — ED Notes (Signed)
Pt reports seen Monday for same- had CT Scan and myelogram. Pain to left shoulder/neck. Dx with spinal stenosis. Pt is out of pain medication, wants shot for pain.

## 2012-03-12 NOTE — ED Provider Notes (Signed)
History     CSN: [id number]  Arrival date & time 03/12/12  1143   First MD Initiated Contact with Patient 03/12/12 1233      Chief Complaint  Patient presents with  . Neck Pain  . Shoulder Pain    (Consider location/radiation/quality/duration/timing/severity/associated sxs/prior treatment) Patient is a 51 y.o. male presenting with neck pain and shoulder pain. The history is provided by the patient. No language interpreter was used.  Neck Pain  This is a chronic problem. The current episode started more than 1 week ago. The problem occurs daily. The problem has not changed since onset.The pain is associated with an unknown factor. There has been no fever. The fever has been present for 5 days or more. The pain is present in the left side. The quality of the pain is described as shooting, aching and burning. The pain radiates to the left arm. The pain is at a severity of 10/10. The pain is severe. The pain is the same all the time. Associated symptoms include numbness, tingling and weakness. Pertinent negatives include no headaches and no paresis. He has tried NSAIDs for the symptoms. The treatment provided no relief.  Shoulder Pain This is a chronic problem. The current episode started more than 1 month ago. The problem occurs daily. The problem has been unchanged. Associated symptoms include neck pain, numbness and weakness. Pertinent negatives include no fever, headaches, joint swelling, nausea or vomiting. The symptoms are aggravated by bending. He has tried oral narcotics for the symptoms. The treatment provided moderate relief.   51 year old male coming in after being seen in the ER on 2 days ago with left upper extremity weakness and neck pain radiating down the left arm. Here today for severe neck left arm pain Out of  pain medication. He's also having left lower extremity weakness. MRI revealed bulging disc and spinal stenosis. He was seen by Dr. Wynetta Emery in the ER but is unable to get in for  an appointment. Patient is asking me to call [facility] and get appointment where he had his last surgery done. We'll call Dr. Dola Argyle office to see if I can get him an appointment. States that the pain is worse but the weakness is about the same. Still taking steroids.  Pain 10/10. pmh.  Past Medical History  Diagnosis Date  . Back pain   . PUD (peptic ulcer disease)   . Hypertension   . Allergy   . GERD (gastroesophageal reflux disease)     Past Surgical History  Procedure Date  . Back surgery     fusion  . Wrist surgery right    repair of tendons and nerves  . Foot surgery     right  . Spinal cord stimulator implant 11/14/2012    Family History  Problem Relation Age of Onset  . Heart disease Father   . Heart disease Sister   . Breast cancer Sister     History  Substance Use Topics  . Smoking status: Former Smoker    Types: Cigarettes    Quit date: 03/23/2010  . Smokeless tobacco: Never Used  . Alcohol Use: No     has been one year since any alcohol      Review of Systems  Constitutional: Negative for fever.  HENT: Positive for neck pain.   Eyes: Negative.   Respiratory: Negative.   Cardiovascular: Negative.  Negative for leg swelling.  Gastrointestinal: Negative.  Negative for nausea and vomiting.  Musculoskeletal: Negative for  back pain and joint swelling.       Neck and LUE pain  Neurological: Positive for tingling, weakness and numbness. Negative for headaches.  Psychiatric/Behavioral: Negative.   All other systems reviewed and are negative.    Allergies  Hydrocodone-acetaminophen  Home Medications   Current Outpatient Rx  Name Route Sig Dispense Refill  . CYCLOBENZAPRINE HCL 10 MG PO TABS Oral Take 10 mg by mouth 2 (two) times daily as needed.    [name] GABAPENTIN 300 MG PO CAPS Oral Take 300 mg by mouth 3 (three) times daily. Take 300mg  daily for 1d, then 300 mg twice daily for 1 day, then 300mg  three times daily    .  HYDROCHLOROTHIAZIDE 25 MG PO TABS Oral Take 25 mg by mouth daily.    . IBUPROFEN 600 MG PO TABS Oral Take 600 mg by mouth every 6 (six) hours as needed.    [name] LOSARTAN POTASSIUM 50 MG PO TABS Oral Take 50 mg by mouth daily.    . METHYLPREDNISOLONE 4 MG PO KIT  follow package directions    . ADULT MULTIVITAMIN W/MINERALS CH Oral Take 1 tablet by mouth daily.    . OXYCODONE-ACETAMINOPHEN 5-325 MG PO TABS Oral Take 2 tablets by mouth every 6 (six) hours as needed.    [name] TINIDAZOLE 500 MG PO TABS Oral Take 4 tablets (2,000 mg total) by mouth once. 4 tablet 0    BP 165/100  Pulse 79  Temp 98 F (36.7 C) (Oral)  Resp 16  SpO2 100%  Physical Exam  Nursing note and vitals reviewed. Constitutional: He is oriented to person, place, and time. He appears well-developed and well-nourished.  HENT:  Head: Normocephalic.  Eyes: Conjunctivae and EOM are normal. Pupils are equal, round, and reactive to light.  Neck: Normal range of motion. Neck supple.  Cardiovascular: Normal rate.   Pulmonary/Chest: Effort normal.  Abdominal: Soft.  Musculoskeletal: Normal range of motion.  Neurological: He is alert and oriented to person, place, and time.  Skin: Skin is warm and dry.  Psychiatric: He has a normal mood and affect.    ED Course  Procedures (including critical care time)  Labs Reviewed - No data to display Ct Cervical Spine W Contrast  03/10/2012  *RADIOLOGY REPORT*  Clinical Data: Cervical spondylosis.  Left neck, shoulder and arm pain.    MYELOGRAM INJECTION  Technique:  Informed consent was obtained from the patient prior to the procedure, including potential complications of headache, allergy, infection and pain.  A timeout procedure was performed. With the patient prone, the lower back was prepped with Betadine. 1% Lidocaine was used for local anesthesia.  Lumbar puncture was performed at the right L3-4 level using a 22 gauge needle with return of clear CSF.  Nine ml of Omnipaque 300was injected  into the subarachnoid space .  IMPRESSION: Successful injection of  intrathecal contrast for myelography.  MYELOGRAM CERVICAL  Technique:  Following injection of intrathecal Omnipaque contrast, spine imaging in multiple projections was performed using fluoroscopy.  Fluoroscopy Time: 1 minute 50 seconds minutes.  Comparison:  03/08/2012  Findings: Imaged taken at the level of the thoracic neurostimulator is does not show any neural defect or stenosis.  Right-sided nerve root sleeves fill normally.  On the left, there is nonfilling of the C6 root sleeve.  There are anterior extradural defects at C4-5 and C5-6, larger at C5-6.  IMPRESSION: Anterior extradural defects, larger at C5-6 than at C4-5. Nonfilling of the left C6 root sleeve.  CT  MYELOGRAPHY CERVICAL SPINE  Technique:  CT imaging of the cervical spine was performed after intrathecal contrast administration. Multiplanar CT image reconstructions were also generated.  Comparison:   None.  Findings:   The foramen magnum is widely patent.  There is minimal osteoarthritis of the C1-2 articulation but no encroachment upon the neural spaces.  C2-3:  Normal interspace.  C3-4:  Central bulging of the disc indents the ventral subarachnoid space but does not compress the cord.  No foraminal stenosis.  No facet arthropathy.  C4-5:  There is a left paracentral disc herniation that effaces the ventral subarachnoid space on the left and deforms the cord slightly.  No apparent foraminal extension.  C5-6:  There is left-sided predominant spondylosis and disc herniation.  The ventral subarachnoid space is effaced on the left and the cord is deformed slightly.  Foraminal involvement on the left would certainly compress the C6 nerve root.  C6-7:  Considerable motion degradation.  I think this is a normal interspace.  C7-T1:  Considerable motion degradation.  There is mild facet degeneration.  No suspected stenosis of the canal or foramina.  IMPRESSION: Left posterolateral disc  herniation and osteophyte at C5-6 with left foraminal involvement certain to compress the left C6 nerve root.  Shallow left posterolateral herniation at C4-5 effaces the left side subarachnoid space.  No apparent foraminal extension.  Original Report Authenticated By: Thomasenia Sales, M.D.   Dg Myelogram Cervical  03/10/2012  *RADIOLOGY REPORT*  Clinical Data: Cervical spondylosis.  Left neck, shoulder and arm pain.    MYELOGRAM INJECTION  Technique:  Informed consent was obtained from the patient prior to the procedure, including potential complications of headache, allergy, infection and pain.  A timeout procedure was performed. With the patient prone, the lower back was prepped with Betadine. 1% Lidocaine was used for local anesthesia.  Lumbar puncture was performed at the right L3-4 level using a 22 gauge needle with return of clear CSF.  Nine ml of Omnipaque 300was injected into the subarachnoid space .  IMPRESSION: Successful injection of  intrathecal contrast for myelography.  MYELOGRAM CERVICAL  Technique:  Following injection of intrathecal Omnipaque contrast, spine imaging in multiple projections was performed using fluoroscopy.  Fluoroscopy Time: 1 minute 50 seconds minutes.  Comparison:  03/08/2012  Findings: Imaged taken at the level of the thoracic neurostimulator is does not show any neural defect or stenosis.  Right-sided nerve root sleeves fill normally.  On the left, there is nonfilling of the C6 root sleeve.  There are anterior extradural defects at C4-5 and C5-6, larger at C5-6.  IMPRESSION: Anterior extradural defects, larger at C5-6 than at C4-5. Nonfilling of the left C6 root sleeve.  CT MYELOGRAPHY CERVICAL SPINE  Technique:  CT imaging of the cervical spine was performed after intrathecal contrast administration. Multiplanar CT image reconstructions were also generated.  Comparison:   None.  Findings:   The foramen magnum is widely patent.  There is minimal osteoarthritis of the C1-2  articulation but no encroachment upon the neural spaces.  C2-3:  Normal interspace.  C3-4:  Central bulging of the disc indents the ventral subarachnoid space but does not compress the cord.  No foraminal stenosis.  No facet arthropathy.  C4-5:  There is a left paracentral disc herniation that effaces the ventral subarachnoid space on the left and deforms the cord slightly.  No apparent foraminal extension.  C5-6:  There is left-sided predominant spondylosis and disc herniation.  The ventral subarachnoid space is effaced on  the left and the cord is deformed slightly.  Foraminal involvement on the left would certainly compress the C6 nerve root.  C6-7:  Considerable motion degradation.  I think this is a normal interspace.  C7-T1:  Considerable motion degradation.  There is mild facet degeneration.  No suspected stenosis of the canal or foramina.  IMPRESSION: Left posterolateral disc herniation and osteophyte at C5-6 with left foraminal involvement certain to compress the left C6 nerve root.  Shallow left posterolateral herniation at C4-5 effaces the left side subarachnoid space.  No apparent foraminal extension.  Original Report Authenticated By: Thomasenia Sales, M.D.     No diagnosis found.    MDM   51yo male with neck and LUE pain/weakness.  Out of pain meds and unable to get appointment.  MRI this week shows C5-6 disc herniation. Saw Dr. Wynetta Emery in the ER 03/08/12.  Appointment arranged through Dr. Dola Argyle office for Tuesday. Dilaudid rx for pain. Afebrile. VSS.  LUE weakness unchanged since he saw Dr. Wynetta Emery.  Return if worse.        Jerome Haggard, NP 03/13/12 1245

## 2012-03-13 NOTE — ED Provider Notes (Signed)
Medical screening examination/treatment/procedure(s) were performed by non-physician practitioner and as supervising physician I was immediately available for consultation/collaboration.  Cyndra Numbers, MD 03/13/12 1251

## 2012-03-17 ENCOUNTER — Other Ambulatory Visit: Payer: Self-pay | Admitting: Neurosurgery

## 2012-03-24 ENCOUNTER — Encounter (HOSPITAL_COMMUNITY): Payer: Self-pay | Admitting: Pharmacy Technician

## 2012-04-01 ENCOUNTER — Telehealth: Payer: Self-pay | Admitting: *Deleted

## 2012-04-01 ENCOUNTER — Other Ambulatory Visit (HOSPITAL_COMMUNITY): Payer: PRIVATE HEALTH INSURANCE

## 2012-04-01 NOTE — Telephone Encounter (Signed)
Call from Landry Corporal NP with [facility]. # - [phone]  She would like to give you feedback for pt's visits. Closed on Friday.

## 2012-04-01 NOTE — Pre-Procedure Instructions (Signed)
20 Jerome Adams  04/01/2012   Your procedure is scheduled on:  04-05-2012  Report to [facility] at 11:00 AM.  Call this number if you have problems the morning of surgery: [phone]   Remember:   Do not eat food or drink:After Midnight    Take these medicines the morning of surgery with A SIP OF WATER: pain medication as needed   Do not wear jewelry  Do not wear lotions, powders, or perfumes. You may wear deodorant.  Do not shave 48 hours prior to surgery. Men may shave face and neck.  Do not bring valuables to the hospital.  Contacts, dentures or bridgework may not be worn into surgery.  Leave suitcase in the car. After surgery it may be brought to your room.  For patients admitted to the hospital, checkout time is 11:00 AM the day of discharge.   [name]    Special Instructions: CHG Shower Use Special Wash: 1/2 bottle night before surgery and 1/2 bottle morning of surgery.      Please read over the following fact sheets that you were given: Pain Booklet, Coughing and Deep Breathing, MRSA Information and Surgical Site Infection Prevention

## 2012-04-02 ENCOUNTER — Encounter (HOSPITAL_COMMUNITY): Payer: Self-pay

## 2012-04-02 ENCOUNTER — Ambulatory Visit (HOSPITAL_COMMUNITY)
Admission: RE | Admit: 2012-04-02 | Discharge: 2012-04-02 | Disposition: A | Discharge status: Home / Self Care | Payer: PRIVATE HEALTH INSURANCE | Source: Ambulatory Visit | Attending: Neurosurgery | Admitting: Neurosurgery

## 2012-04-02 ENCOUNTER — Encounter (HOSPITAL_COMMUNITY)
Admission: RE | Admit: 2012-04-02 | Discharge: 2012-04-02 | Disposition: A | Discharge status: Home / Self Care | Payer: PRIVATE HEALTH INSURANCE | Source: Ambulatory Visit | Attending: Neurosurgery | Admitting: Neurosurgery

## 2012-04-02 DIAGNOSIS — Z01810 Encounter for preprocedural cardiovascular examination: Secondary | ICD-10-CM | POA: Insufficient documentation

## 2012-04-02 DIAGNOSIS — Z01818 Encounter for other preprocedural examination: Secondary | ICD-10-CM | POA: Insufficient documentation

## 2012-04-02 DIAGNOSIS — Z01812 Encounter for preprocedural laboratory examination: Secondary | ICD-10-CM | POA: Insufficient documentation

## 2012-04-02 HISTORY — DX: Sleep apnea, unspecified: G47.30

## 2012-04-02 HISTORY — DX: Presence of other specified functional implants: Z96.89

## 2012-04-02 LAB — COMPREHENSIVE METABOLIC PANEL
ALT: 24 U/L (ref 0–53)
AST: 50 U/L — ABNORMAL HIGH (ref 0–37)
Albumin: 4.1 g/dL (ref 3.5–5.2)
Alkaline Phosphatase: 61 U/L (ref 39–117)
BUN: 17 mg/dL (ref 6–23)
CO2: 29 mEq/L (ref 19–32)
Calcium: 9.7 mg/dL (ref 8.4–10.5)
Chloride: 100 mEq/L (ref 96–112)
Creatinine, Ser: 1.17 mg/dL (ref 0.50–1.35)
GFR calc Af Amer: 82 mL/min — ABNORMAL LOW (ref >90)
GFR calc non Af Amer: 71 mL/min — ABNORMAL LOW (ref >90)
Glucose, Bld: 108 mg/dL — ABNORMAL HIGH (ref 70–99)
Potassium: 4.7 mEq/L (ref 3.5–5.1)
Sodium: 141 mEq/L (ref 135–145)
Total Bilirubin: 0.4 mg/dL (ref 0.3–1.2)
Total Protein: 7.2 g/dL (ref 6.0–8.3)

## 2012-04-02 LAB — CBC
HCT: 42.7 % (ref 39.0–52.0)
Hemoglobin: 14.2 g/dL (ref 13.0–17.0)
MCH: 26 pg (ref 26.0–34.0)
MCHC: 33.3 g/dL (ref 30.0–36.0)
MCV: 78.1 fL (ref 78.0–100.0)
Platelets: 262 10*3/uL (ref 150–400)
RBC: 5.47 MIL/uL (ref 4.22–5.81)
RDW: 16.2 % — ABNORMAL HIGH (ref 11.5–15.5)
WBC: 8.7 10*3/uL (ref 4.0–10.5)

## 2012-04-02 LAB — SURGICAL PCR SCREEN
MRSA, PCR: POSITIVE — AB
Staphylococcus aureus: POSITIVE — AB

## 2012-04-02 NOTE — Progress Notes (Signed)
0945   Friday..the patient HAD SLEEP STUDY DONE, HOWEVER, HE STATES THEY NEVER CALLED HIM BACK WITH RESULTS...he NEVER HAS NOR WEARS A MASK....DA

## 2012-04-05 ENCOUNTER — Encounter (HOSPITAL_COMMUNITY)
Admission: RE | Disposition: A | Discharge status: Home / Self Care | Payer: Self-pay | Source: Ambulatory Visit | Attending: Neurosurgery

## 2012-04-05 ENCOUNTER — Encounter (HOSPITAL_COMMUNITY): Payer: Self-pay | Admitting: Anesthesiology

## 2012-04-05 ENCOUNTER — Ambulatory Visit (HOSPITAL_COMMUNITY): Payer: PRIVATE HEALTH INSURANCE

## 2012-04-05 ENCOUNTER — Inpatient Hospital Stay (HOSPITAL_COMMUNITY)
Admission: RE | Admit: 2012-04-05 | Discharge: 2012-04-06 | DRG: 030 | Disposition: A | Discharge status: Home / Self Care | Payer: PRIVATE HEALTH INSURANCE | Source: Ambulatory Visit | Attending: Neurosurgery | Admitting: Neurosurgery

## 2012-04-05 ENCOUNTER — Ambulatory Visit (HOSPITAL_COMMUNITY): Payer: PRIVATE HEALTH INSURANCE | Admitting: Anesthesiology

## 2012-04-05 ENCOUNTER — Encounter (HOSPITAL_COMMUNITY): Payer: Self-pay | Admitting: *Deleted

## 2012-04-05 DIAGNOSIS — K219 Gastro-esophageal reflux disease without esophagitis: Secondary | ICD-10-CM | POA: Diagnosis present

## 2012-04-05 DIAGNOSIS — M5412 Radiculopathy, cervical region: Principal | ICD-10-CM | POA: Diagnosis present

## 2012-04-05 DIAGNOSIS — Z87891 Personal history of nicotine dependence: Secondary | ICD-10-CM

## 2012-04-05 DIAGNOSIS — I10 Essential (primary) hypertension: Secondary | ICD-10-CM | POA: Diagnosis present

## 2012-04-05 HISTORY — PX: ANTERIOR CERVICAL DECOMP/DISCECTOMY FUSION: SHX1161

## 2012-04-05 SURGERY — ANTERIOR CERVICAL DECOMPRESSION/DISCECTOMY FUSION 1 LEVEL
Anesthesia: General | Site: Spine Cervical | Wound class: Clean

## 2012-04-05 MED ORDER — LOSARTAN POTASSIUM 50 MG PO TABS
50.0000 mg | ORAL_TABLET | Freq: Every day | ORAL | Status: DC
  Administered 2012-04-05 – 2012-04-06 (×2): 50 mg via ORAL
  Filled 2012-04-05 (×2): qty 1

## 2012-04-05 MED ORDER — HYDROMORPHONE HCL PF 1 MG/ML IJ SOLN
0.5000 mg | INTRAMUSCULAR | Status: DC | PRN
  Administered 2012-04-05 – 2012-04-06 (×3): 1 mg via INTRAVENOUS
  Filled 2012-04-05 (×3): qty 1

## 2012-04-05 MED ORDER — 0.9 % SODIUM CHLORIDE (POUR BTL) OPTIME
TOPICAL | Status: DC | PRN
  Administered 2012-04-05: 1000 mL

## 2012-04-05 MED ORDER — OXYCODONE HCL 5 MG PO TABS
5.0000 mg | ORAL_TABLET | Freq: Once | ORAL | Status: AC | PRN
  Administered 2012-04-05: 5 mg via ORAL

## 2012-04-05 MED ORDER — OXYCODONE-ACETAMINOPHEN 5-325 MG PO TABS
1.0000 | ORAL_TABLET | ORAL | Status: DC | PRN
  Administered 2012-04-05: 2 via ORAL
  Filled 2012-04-05: qty 2

## 2012-04-05 MED ORDER — LIDOCAINE HCL (CARDIAC) 20 MG/ML IV SOLN
INTRAVENOUS | Status: DC | PRN
  Administered 2012-04-05: 50 mg via INTRAVENOUS

## 2012-04-05 MED ORDER — MUPIROCIN 2 % EX OINT
1.0000 "application " | TOPICAL_OINTMENT | Freq: Two times a day (BID) | CUTANEOUS | Status: DC
  Administered 2012-04-05: 1 via NASAL
  Filled 2012-04-05: qty 22

## 2012-04-05 MED ORDER — SODIUM CHLORIDE 0.9 % IJ SOLN: 3.0000 mL | INTRAMUSCULAR | Status: DC | PRN

## 2012-04-05 MED ORDER — BACITRACIN 50000 UNITS IM SOLR
INTRAMUSCULAR | Status: AC
  Filled 2012-04-05: qty 1

## 2012-04-05 MED ORDER — NEOSTIGMINE METHYLSULFATE 1 MG/ML IJ SOLN
INTRAMUSCULAR | Status: DC | PRN
  Administered 2012-04-05: 4 mg via INTRAVENOUS

## 2012-04-05 MED ORDER — LACTATED RINGERS IV SOLN
INTRAVENOUS | Status: DC | PRN
  Administered 2012-04-05 (×2): via INTRAVENOUS

## 2012-04-05 MED ORDER — SODIUM CHLORIDE 0.9 % IJ SOLN
3.0000 mL | Freq: Two times a day (BID) | INTRAMUSCULAR | Status: DC
  Administered 2012-04-05: 3 mL via INTRAVENOUS

## 2012-04-05 MED ORDER — MIDAZOLAM HCL 5 MG/5ML IJ SOLN
INTRAMUSCULAR | Status: DC | PRN
  Administered 2012-04-05: 2 mg via INTRAVENOUS

## 2012-04-05 MED ORDER — CEFAZOLIN SODIUM-DEXTROSE 2-3 GM-% IV SOLR
INTRAVENOUS | Status: AC
  Administered 2012-04-05: 2 g via INTRAVENOUS
  Filled 2012-04-05: qty 50

## 2012-04-05 MED ORDER — PROPOFOL 10 MG/ML IV BOLUS
INTRAVENOUS | Status: DC | PRN
  Administered 2012-04-05: 150 mg via INTRAVENOUS

## 2012-04-05 MED ORDER — PHENOL 1.4 % MT LIQD: 1.0000 | OROMUCOSAL | Status: DC | PRN

## 2012-04-05 MED ORDER — OXYCODONE HCL 5 MG PO TABS
ORAL_TABLET | ORAL | Status: AC
  Administered 2012-04-05: 5 mg via ORAL
  Filled 2012-04-05: qty 1

## 2012-04-05 MED ORDER — DEXAMETHASONE SODIUM PHOSPHATE 4 MG/ML IJ SOLN
INTRAMUSCULAR | Status: DC | PRN
  Administered 2012-04-05: 10 mg via INTRAVENOUS

## 2012-04-05 MED ORDER — HEMOSTATIC AGENTS (NO CHARGE) OPTIME
TOPICAL | Status: DC | PRN
  Administered 2012-04-05: 1 via TOPICAL

## 2012-04-05 MED ORDER — SODIUM CHLORIDE 0.9 % IV SOLN: 250.0000 mL | INTRAVENOUS | Status: DC

## 2012-04-05 MED ORDER — ONDANSETRON HCL 4 MG/2ML IJ SOLN
INTRAMUSCULAR | Status: DC | PRN
  Administered 2012-04-05: 4 mg via INTRAVENOUS

## 2012-04-05 MED ORDER — SODIUM CHLORIDE 0.9 % IV SOLN
INTRAVENOUS | Status: AC
  Filled 2012-04-05: qty 500

## 2012-04-05 MED ORDER — SIMVASTATIN 5 MG PO TABS
5.0000 mg | ORAL_TABLET | Freq: Every day | ORAL | Status: DC
  Administered 2012-04-05: 5 mg via ORAL
  Filled 2012-04-05 (×2): qty 1

## 2012-04-05 MED ORDER — MENTHOL 3 MG MT LOZG: 1.0000 | LOZENGE | OROMUCOSAL | Status: DC | PRN

## 2012-04-05 MED ORDER — ROCURONIUM BROMIDE 100 MG/10ML IV SOLN
INTRAVENOUS | Status: DC | PRN
  Administered 2012-04-05: 50 mg via INTRAVENOUS

## 2012-04-05 MED ORDER — THROMBIN 5000 UNITS EX KIT
PACK | CUTANEOUS | Status: DC | PRN
  Administered 2012-04-05 (×2): 5000 [IU] via TOPICAL

## 2012-04-05 MED ORDER — GABAPENTIN 300 MG PO CAPS
300.0000 mg | ORAL_CAPSULE | Freq: Three times a day (TID) | ORAL | Status: DC
  Administered 2012-04-05 – 2012-04-06 (×2): 300 mg via ORAL
  Filled 2012-04-05 (×4): qty 1

## 2012-04-05 MED ORDER — HYDROMORPHONE HCL PF 1 MG/ML IJ SOLN
0.2500 mg | INTRAMUSCULAR | Status: DC | PRN
  Administered 2012-04-05 (×4): 0.5 mg via INTRAVENOUS

## 2012-04-05 MED ORDER — OXYCODONE HCL 5 MG/5ML PO SOLN: 5.0000 mg | Freq: Once | ORAL | Status: AC | PRN

## 2012-04-05 MED ORDER — GLYCOPYRROLATE 0.2 MG/ML IJ SOLN
INTRAMUSCULAR | Status: DC | PRN
  Administered 2012-04-05: .5 mg via INTRAVENOUS

## 2012-04-05 MED ORDER — CEFAZOLIN SODIUM 1-5 GM-% IV SOLN
1.0000 g | Freq: Three times a day (TID) | INTRAVENOUS | Status: AC
  Administered 2012-04-05 – 2012-04-06 (×2): 1 g via INTRAVENOUS
  Filled 2012-04-05 (×2): qty 50

## 2012-04-05 MED ORDER — FENTANYL CITRATE 0.05 MG/ML IJ SOLN
INTRAMUSCULAR | Status: DC | PRN
  Administered 2012-04-05: 150 ug via INTRAVENOUS
  Administered 2012-04-05 (×3): 100 ug via INTRAVENOUS

## 2012-04-05 MED ORDER — ADULT MULTIVITAMIN W/MINERALS CH
1.0000 | ORAL_TABLET | Freq: Every day | ORAL | Status: DC
  Administered 2012-04-05 – 2012-04-06 (×2): 1 via ORAL
  Filled 2012-04-05 (×2): qty 1

## 2012-04-05 MED ORDER — SUCCINYLCHOLINE CHLORIDE 20 MG/ML IJ SOLN
INTRAMUSCULAR | Status: DC | PRN
  Administered 2012-04-05: 120 mg via INTRAVENOUS

## 2012-04-05 MED ORDER — OXYCODONE HCL 5 MG PO TABS
15.0000 mg | ORAL_TABLET | ORAL | Status: DC | PRN
  Administered 2012-04-05 – 2012-04-06 (×3): 15 mg via ORAL
  Filled 2012-04-05: qty 3
  Filled 2012-04-05: qty 2
  Filled 2012-04-05: qty 1
  Filled 2012-04-05: qty 3

## 2012-04-05 MED ORDER — ONDANSETRON HCL 4 MG/2ML IJ SOLN: 4.0000 mg | INTRAMUSCULAR | Status: DC | PRN

## 2012-04-05 MED ORDER — HYDROMORPHONE HCL PF 1 MG/ML IJ SOLN
INTRAMUSCULAR | Status: AC
Start: 2012-04-05 — End: 2012-04-05
  Administered 2012-04-05: 0.5 mg via INTRAVENOUS
  Filled 2012-04-05: qty 1

## 2012-04-05 MED ORDER — HYDROMORPHONE HCL PF 1 MG/ML IJ SOLN
INTRAMUSCULAR | Status: AC
  Administered 2012-04-05: 0.5 mg via INTRAVENOUS
  Filled 2012-04-05: qty 1

## 2012-04-05 MED ORDER — DEXTROSE 5 % IV SOLN
INTRAVENOUS | Status: DC | PRN
  Administered 2012-04-05: 13:00:00 via INTRAVENOUS

## 2012-04-05 MED ORDER — HYDROCHLOROTHIAZIDE 25 MG PO TABS
25.0000 mg | ORAL_TABLET | Freq: Every day | ORAL | Status: DC
  Administered 2012-04-05 – 2012-04-06 (×2): 25 mg via ORAL
  Filled 2012-04-05 (×2): qty 1

## 2012-04-05 MED ORDER — LIDOCAINE HCL 4 % MT SOLN
OROMUCOSAL | Status: DC | PRN
  Administered 2012-04-05: 4 mL via TOPICAL

## 2012-04-05 MED ORDER — PANTOPRAZOLE SODIUM 40 MG IV SOLR
40.0000 mg | Freq: Every day | INTRAVENOUS | Status: DC
  Administered 2012-04-05: 40 mg via INTRAVENOUS
  Filled 2012-04-05 (×2): qty 40

## 2012-04-05 MED ORDER — ACETAMINOPHEN 325 MG PO TABS: 650.0000 mg | ORAL_TABLET | ORAL | Status: DC | PRN

## 2012-04-05 MED ORDER — CYCLOBENZAPRINE HCL 10 MG PO TABS
10.0000 mg | ORAL_TABLET | Freq: Three times a day (TID) | ORAL | Status: DC | PRN
  Administered 2012-04-05 – 2012-04-06 (×2): 10 mg via ORAL
  Filled 2012-04-05 (×2): qty 1

## 2012-04-05 MED ORDER — SODIUM CHLORIDE 0.9 % IR SOLN
Status: DC | PRN
  Administered 2012-04-05: 14:00:00

## 2012-04-05 MED ORDER — CHLORHEXIDINE GLUCONATE CLOTH 2 % EX PADS
6.0000 | MEDICATED_PAD | Freq: Every day | CUTANEOUS | Status: DC
  Administered 2012-04-06: 6 via TOPICAL

## 2012-04-05 MED ORDER — THROMBIN 5000 UNITS EX SOLR
OROMUCOSAL | Status: DC | PRN
  Administered 2012-04-05: 14:00:00 via TOPICAL

## 2012-04-05 MED ORDER — ACETAMINOPHEN 650 MG RE SUPP: 650.0000 mg | RECTAL | Status: DC | PRN

## 2012-04-05 SURGICAL SUPPLY — 65 items
BAG DECANTER FOR FLEXI CONT (MISCELLANEOUS) ×2 IMPLANT
BENZOIN TINCTURE PRP APPL 2/3 (GAUZE/BANDAGES/DRESSINGS) ×2 IMPLANT
BIT DRILL SPINE QC 12 (BIT) ×2 IMPLANT
BRUSH SCRUB EZ PLAIN DRY (MISCELLANEOUS) ×2 IMPLANT
BUR MATCHSTICK NEURO 3.0 LAGG (BURR) ×2 IMPLANT
CANISTER SUCTION 2500CC (MISCELLANEOUS) ×2 IMPLANT
CLOTH BEACON ORANGE TIMEOUT ST (SAFETY) ×2 IMPLANT
CONT SPEC 4OZ CLIKSEAL STRL BL (MISCELLANEOUS) ×2 IMPLANT
DERMABOND ADHESIVE PROPEN (GAUZE/BANDAGES/DRESSINGS) ×1
DERMABOND ADVANCED (GAUZE/BANDAGES/DRESSINGS)
DERMABOND ADVANCED .7 DNX12 (GAUZE/BANDAGES/DRESSINGS) IMPLANT
DERMABOND ADVANCED .7 DNX6 (GAUZE/BANDAGES/DRESSINGS) ×1 IMPLANT
DRAPE C-ARM 42X72 X-RAY (DRAPES) ×4 IMPLANT
DRAPE LAPAROTOMY 100X72 PEDS (DRAPES) ×2 IMPLANT
DRAPE MICROSCOPE ZEISS OPMI (DRAPES) ×2 IMPLANT
DRAPE POUCH INSTRU U-SHP 10X18 (DRAPES) ×2 IMPLANT
DRSG OPSITE 4X5.5 SM (GAUZE/BANDAGES/DRESSINGS) ×2 IMPLANT
ELECT COATED BLADE 2.86 ST (ELECTRODE) ×2 IMPLANT
ELECT REM PT RETURN 9FT ADLT (ELECTROSURGICAL) ×2
ELECTRODE REM PT RTRN 9FT ADLT (ELECTROSURGICAL) ×1 IMPLANT
GAUZE SPONGE 4X4 16PLY XRAY LF (GAUZE/BANDAGES/DRESSINGS) IMPLANT
GLOVE BIO SURGEON STRL SZ8 (GLOVE) ×2 IMPLANT
GLOVE BIOGEL PI IND STRL 7.0 (GLOVE) ×1 IMPLANT
GLOVE BIOGEL PI IND STRL 7.5 (GLOVE) ×1 IMPLANT
GLOVE BIOGEL PI IND STRL 8.5 (GLOVE) ×1 IMPLANT
GLOVE BIOGEL PI INDICATOR 7.0 (GLOVE) ×1
GLOVE BIOGEL PI INDICATOR 7.5 (GLOVE) ×1
GLOVE BIOGEL PI INDICATOR 8.5 (GLOVE) ×1
GLOVE ECLIPSE 7.5 STRL STRAW (GLOVE) ×2 IMPLANT
GLOVE ECLIPSE 8.5 STRL (GLOVE) ×2 IMPLANT
GLOVE EXAM NITRILE LRG STRL (GLOVE) IMPLANT
GLOVE EXAM NITRILE MD LF STRL (GLOVE) ×2 IMPLANT
GLOVE EXAM NITRILE XL STR (GLOVE) IMPLANT
GLOVE EXAM NITRILE XS STR PU (GLOVE) IMPLANT
GLOVE INDICATOR 8.5 STRL (GLOVE) ×2 IMPLANT
GLOVE SURG SS PI 6.5 STRL IVOR (GLOVE) ×2 IMPLANT
GOWN BRE IMP SLV AUR LG STRL (GOWN DISPOSABLE) ×2 IMPLANT
GOWN BRE IMP SLV AUR XL STRL (GOWN DISPOSABLE) ×4 IMPLANT
GOWN STRL REIN 2XL LVL4 (GOWN DISPOSABLE) ×2 IMPLANT
HEAD HALTER (SOFTGOODS) ×2 IMPLANT
HEMOSTAT POWDER KIT SURGIFOAM (HEMOSTASIS) ×2 IMPLANT
KIT BASIN OR (CUSTOM PROCEDURE TRAY) ×2 IMPLANT
KIT ROOM TURNOVER OR (KITS) ×2 IMPLANT
NEEDLE HYPO 18GX1.5 BLUNT FILL (NEEDLE) IMPLANT
NEEDLE SPNL 20GX3.5 QUINCKE YW (NEEDLE) ×2 IMPLANT
NS IRRIG 1000ML POUR BTL (IV SOLUTION) ×2 IMPLANT
PACK LAMINECTOMY NEURO (CUSTOM PROCEDURE TRAY) ×2 IMPLANT
PAD ARMBOARD 7.5X6 YLW CONV (MISCELLANEOUS) ×6 IMPLANT
PLATE ANT CERV XTEND 1 LV 14 (Plate) ×2 IMPLANT
PUTTY BONE DBX 2.5 MIS (Bone Implant) ×2 IMPLANT
RUBBERBAND STERILE (MISCELLANEOUS) ×4 IMPLANT
SCREW XTD VAR 4.2 SELF TAP 12 (Screw) ×8 IMPLANT
SPACER COLONIAL LGE 8MM 7DEG (Spacer) ×2 IMPLANT
SPONGE GAUZE 4X4 12PLY (GAUZE/BANDAGES/DRESSINGS) ×2 IMPLANT
SPONGE INTESTINAL PEANUT (DISPOSABLE) ×2 IMPLANT
SPONGE SURGIFOAM ABS GEL SZ50 (HEMOSTASIS) ×2 IMPLANT
STRIP CLOSURE SKIN 1/2X4 (GAUZE/BANDAGES/DRESSINGS) ×2 IMPLANT
SUT VIC AB 3-0 SH 8-18 (SUTURE) ×2 IMPLANT
SUT VICRYL 4-0 PS2 18IN ABS (SUTURE) ×2 IMPLANT
SYR 20ML ECCENTRIC (SYRINGE) ×2 IMPLANT
TAPE CLOTH 4X10 WHT NS (GAUZE/BANDAGES/DRESSINGS) IMPLANT
TOWEL OR 17X24 6PK STRL BLUE (TOWEL DISPOSABLE) ×2 IMPLANT
TOWEL OR 17X26 10 PK STRL BLUE (TOWEL DISPOSABLE) ×2 IMPLANT
TRAP SPECIMEN MUCOUS 40CC (MISCELLANEOUS) ×2 IMPLANT
WATER STERILE IRR 1000ML POUR (IV SOLUTION) ×2 IMPLANT

## 2012-04-05 NOTE — Op Note (Signed)
Preoperative diagnosis: C6 radiculopathy from large ruptured disc C5-6 left  Postoperative diagnosis: Same  Procedure: Anterior cervical discectomy and fusion at C5-6 using globus peek cage packed with local autograft  mixed with DBX and the globus extend plating system with 4-12 mm variable angle screws.  Surgeon: Jillyn Hidden Everest Brod  Assistant: Barnett Abu  Anesthesia: General  EBL: Minimal  History of present illness: Patient is a very pleasant 51 year old gentleman presented with progressive worsening neck and left arm pain with numbness tingling and C6 distribution weakness in his bicep and tricep workup with CT myelogram showed large disc herniation with severe spinal cord compression left C6 nerve root encroachment and so patient was recommended anterior cervical discectomy fusion at [place] closed conservative treatment progression of clinical syndrome and corresponding imaging findings. I extensively the risks benefits of the operation with him as well as perioperative course expectations of outcome and alternatives to surgery.  Operative procedure: Patient was brought into the or was induced under general anesthesia positioned supine the neck in slight extension in 5 pounds of halter traction the right side effects prepped and draped in routine sterile fashion preoperative localizing appropriate level so a curvilinear incision was made just up midline to the antebrachial the sternomastoid and superficially of the platysmas dissected and divided longitudinally the avascular plane to sternomastoid and strap as was developed down to the prevertebral fascia professes acid with Kitners. Interoperative X. identify the appropriate level so annulotomy was made with meticulous marked the disc space and lungs close was reflected laterally. Self-retaining retractors placed the large anterior aspect of did not flexor rongeur and a 3 minute Kerrison punch disc space and scraped pituitary rongeurs used to been into  margin annulus and disc space osteophytes were drilled down a high-speed drill fashion the bone shavings in a mucous trap. Then a myself pronation and this was further drilled down aggressive and viable template is carried out millimeters posterior to large posterior spur coming off the C5 vertebral body was displaced and the left side of the spinal cord is aggressively under been decompress the central canal March across laterally the C6 pedicle was identified and several very large free fragments disc were teased out the foramen and after upon this the C6 nerve root was identified and skin is significantly decompressed. Further skeletonization C6 there was carried out so social pedicle the foramen was widely patent. March across laterally the right C6 pedicle was identified and the right she 6 foramen was also decompressed. After adequate confirmation that the foramen and central canal were adequately decompressed, there was copious irrigated meticulous he states was maintained and the endplates were scraped with a BA curette a 8 mm peek cages packed with local autograft mixed DBX and inserted approximately 2 mm deep to the anterior vertebra line then a 40 mg placed all screws excellent purchase locking mechanism was engaged posterior fluoroscopy confirmed good position of plate screws and bone graft and then the wounds closed in layers with Vicryl and platysma and a running 4 septic or benzoin and Steri-Strips were applied patient recovered in stable condition at the end of case on it counts sponge counts were correct.

## 2012-04-05 NOTE — H&P (Addendum)
Jerome Adams is an 51 y.o. male.   Chief Complaint: Neck and left arm pain HPI: Patient reports-year-old gentleman who presents our last weeks and months with worsening neck pain radiating to her left arm with weakness in his left arm and hand is initially present emergency department several weeks ago workup revealed cervical spondylosis on plain CT and the patient was subsequently discharged underwent outpatient CT myelogram which revealed significant disc herniation at C5-6 with foraminal and spinal cord compression.  the patient now presents for anterior cervical discectomy and fusion due to failure conservative treatment slow progression of clinical syndrome and imaging findings the cystic other risks benefits of the operation with him as well as therapy course expectations of outcome and alternatives to surgery he understands and agrees to proceed forward .  Past Medical History  Diagnosis Date  . Back pain   . PUD (peptic ulcer disease)   . Hypertension   . Allergy   . GERD (gastroesophageal reflux disease)     takes  otc  . Sleep apnea     was tested here at [facility].Marland KitchenMarland KitchenMarland KitchenNever heard anymore about it  . Spinal cord stimulator status     has had for 1 yr--inserted by [facility]    Past Surgical History  Procedure Date  . Back surgery     fusion  . Wrist surgery right    repair of tendons and nerves  . Foot surgery     right  . Spinal cord stimulator implant 11/14/2012    Family History  Problem Relation Age of Onset  . Heart disease Father   . Heart disease Sister   . Breast cancer Sister    Social History:  reports that he quit smoking about 2 years ago. His smoking use included Cigarettes. He has never used smokeless tobacco. He reports that he uses illicit drugs (Cocaine). He reports that he does not drink alcohol.  Allergies:  Allergies  Allergen Reactions  . Hydrocodone-Acetaminophen Itching    Medications Prior to Admission  Medication Sig Dispense Refill  .  gabapentin (NEURONTIN) 300 MG capsule Take 300 mg by mouth 3 (three) times daily.       . hydrochlorothiazide (HYDRODIURIL) 25 MG tablet Take 25 mg by mouth daily.      [name] losartan (COZAAR) 50 MG tablet Take 50 mg by mouth daily.      [name] lovastatin (MEVACOR) 40 MG tablet Take 40 mg by mouth daily.      . Multiple Vitamin (MULITIVITAMIN WITH MINERALS) TABS Take 1 tablet by mouth daily.      [name] oxyCODONE (ROXICODONE) 15 MG immediate release tablet Take 15 mg by mouth every 8 (eight) hours as needed. For pain        No results found for this or any previous visit (from the past 48 hour(s)). No results found.  Review of Systems  Constitutional: Negative.   HENT: Positive for neck pain.   Eyes: Negative.   Respiratory: Negative.   Cardiovascular: Negative.   Gastrointestinal: Negative.   Genitourinary: Negative.   Musculoskeletal: Positive for myalgias and back pain.  Skin: Negative.   Neurological: Positive for sensory change.  Endo/Heme/Allergies: Negative.   Psychiatric/Behavioral: Negative.     Blood pressure 137/91, pulse 71, temperature 98.1 F (36.7 C), temperature source Oral, resp. rate 20, SpO2 99.00%. Physical Exam  Constitutional: He is oriented to person, place, and time. He appears well-developed and well-nourished.  HENT:  Head: Normocephalic and atraumatic.  Eyes: Pupils are equal,  round, and reactive to light.  Neck: Normal range of motion.  Cardiovascular: Normal rate.   Respiratory: Effort normal and breath sounds normal.  GI: Soft. Bowel sounds are normal.  Neurological: He is alert and oriented to person, place, and time. He has normal strength. GCS eye subscore is 4. GCS verbal subscore is 5. GCS motor subscore is 6.  Reflex Scores:      Tricep reflexes are 2+ on the right side and 2+ on the left side.      Bicep reflexes are 2+ on the right side and 2+ on the left side.      Brachioradialis reflexes are 2+ on the right side and 2+ on the left side.       Patellar reflexes are 0 on the right side and 0 on the left side.      Achilles reflexes are 0 on the right side and 0 on the left side.      Strength in his right upper extremity is 5 out of 5 in his deltoid biceps triceps wrist flexion extension and intrinsics strength in his left upper extremity reveals weakness in his biceps and triceps at 4-4+ out of 5 in wrist extension is 4+ out of 5     Assessment/Plan 50 presents for ACDF at C5-6  Briar Sword P 04/05/2012, 12:27 PM

## 2012-04-05 NOTE — Anesthesia Postprocedure Evaluation (Signed)
  Anesthesia Post-op Note  Patient: Jerome Adams  Procedure(s) Performed: Procedure(s) (LRB) with comments: ANTERIOR CERVICAL DECOMPRESSION/DISCECTOMY FUSION 1 LEVEL (N/A) - Cervical five-six anterior cervical decompression and fusion  Patient Location: PACU  Anesthesia Type: General  Level of Consciousness: awake  Airway and Oxygen Therapy: Patient Spontanous Breathing and Patient connected to nasal cannula oxygen  Post-op Pain: mild  Post-op Assessment: Post-op Vital signs reviewed, Patient's Cardiovascular Status Stable, Respiratory Function Stable, Patent Airway and No signs of Nausea or vomiting  Post-op Vital Signs: Reviewed and stable  Complications: No apparent anesthesia complications

## 2012-04-05 NOTE — Preoperative (Signed)
Beta Blockers   Reason not to administer Beta Blockers:Not Applicable 

## 2012-04-05 NOTE — Anesthesia Preprocedure Evaluation (Addendum)
Anesthesia Evaluation  Patient identified by MRN, date of birth, ID band Patient awake    Reviewed: Allergy & Precautions, H&P , NPO status , Patient's Chart, lab work & pertinent test results  Airway Mallampati: III TM Distance: >3 FB Neck ROM: Full    Dental No notable dental hx. (+) Teeth Intact and Dental Advisory Given   Pulmonary neg pulmonary ROS, sleep apnea ,  breath sounds clear to auscultation  Pulmonary exam normal       Cardiovascular hypertension, On Medications and Pt. on medications Rhythm:Regular Rate:Normal     Neuro/Psych negative neurological ROS  negative psych ROS   GI/Hepatic Neg liver ROS, PUD, GERD-  Medicated and Controlled,  Endo/Other  negative endocrine ROS  Renal/GU negative Renal ROS  negative genitourinary   Musculoskeletal negative musculoskeletal ROS (+)   Abdominal   Peds  Hematology negative hematology ROS (+)   Anesthesia Other Findings   Reproductive/Obstetrics negative OB ROS                          Anesthesia Physical Anesthesia Plan  ASA: II  Anesthesia Plan: General   Post-op Pain Management:    Induction: Intravenous  Airway Management Planned: Oral ETT  Additional Equipment:   Intra-op Plan:   Post-operative Plan: Extubation in OR  Informed Consent: I have reviewed the patients History and Physical, chart, labs and discussed the procedure including the risks, benefits and alternatives for the proposed anesthesia with the patient or authorized representative who has indicated his/her understanding and acceptance.   Dental advisory given  Plan Discussed with: CRNA  Anesthesia Plan Comments:         Anesthesia Quick Evaluation

## 2012-04-05 NOTE — Anesthesia Procedure Notes (Signed)
Procedure Name: Intubation Date/Time: 04/05/2012 12:47 PM Performed by: Ilaisaane Marts S Pre-anesthesia Checklist: Patient identified, Emergency Drugs available, Suction available, Patient being monitored and Timeout performed Patient Re-evaluated:Patient Re-evaluated prior to inductionOxygen Delivery Method: Circle system utilized Preoxygenation: Pre-oxygenation with 100% oxygen Intubation Type: IV induction Ventilation: Mask ventilation without difficulty Laryngoscope Size: Mac and 4 Grade View: Grade I Tube type: Oral Tube size: 7.5 mm Number of attempts: 1 Airway Equipment and Method: Stylet Placement Confirmation: ETT inserted through vocal cords under direct vision,  positive ETCO2 and breath sounds checked- equal and bilateral Secured at: 22 cm Tube secured with: Tape Dental Injury: Teeth and Oropharynx as per pre-operative assessment

## 2012-04-06 ENCOUNTER — Encounter (HOSPITAL_COMMUNITY): Payer: Self-pay | Admitting: Neurosurgery

## 2012-04-06 MED ORDER — OXYCODONE HCL 15 MG PO TABS: 15.0000 mg | ORAL_TABLET | ORAL | Status: AC | PRN

## 2012-04-06 MED ORDER — CYCLOBENZAPRINE HCL 10 MG PO TABS: 10.0000 mg | ORAL_TABLET | Freq: Three times a day (TID) | ORAL | Status: AC | PRN

## 2012-04-06 NOTE — Progress Notes (Signed)
UR COMPLETED  

## 2012-04-06 NOTE — Telephone Encounter (Signed)
I called Ms. Landry Corporal NP with [facility] about Jerome Adams's pain management. I was told that patient was dismissed from their clinic due to positive UDS for cocaine.   Lorretta Harp, MD PGY2, Internal Medicine Teaching Service Pager: [phone],

## 2012-04-06 NOTE — Progress Notes (Signed)
Subjective: Patient reports Days feeling well no arm pain much better movement strength no numbness  Objective: Vital signs in last 24 hours: Temp:  [98.1 F (36.7 C)-98.8 F (37.1 C)] 98.6 F (37 C) (09/10 0500) Pulse Rate:  [57-99] 84  (09/10 0500) Resp:  [8-24] 18  (09/10 0500) BP: (129-151)/(72-95) 151/85 mmHg (09/10 0500) SpO2:  [94 %-100 %] 98 % (09/10 0500)  Intake/Output from previous day: 09/09 0701 - 09/10 0700 In: 2160 [Adams.O.:360; I.V.:1800] Out: 50 [Blood:50] Intake/Output this shift:    Strength 5 out of 5 wound clean and dry significantly improved in his left upper extremity  Lab Results: No results found for this basename: WBC:2,HGB:2,HCT:2,PLT:2 in the last 72 hours BMET No results found for this basename: NA:2,K:2,CL:2,CO2:2,GLUCOSE:2,BUN:2,CREATININE:2,CALCIUM:2 in the last 72 hours  Studies/Results: Dg Cervical Spine 1 View  04/05/2012  *RADIOLOGY REPORT*  Clinical Data: ACDF  DG CERVICAL SPINE - 1 VIEW  Comparison: 03/10/2012  Findings: Single image shows anterior cervical discectomy and fusion at C5-6.  Interbody fusion material is in place with an anterior plate and screw fixation.  IMPRESSION: ACDF C5-6.   Original Report Authenticated By: Thomasenia Sales, M.D.     Assessment/Plan: [name] 04/06/2012, 7:53 AM

## 2012-04-06 NOTE — Discharge Summary (Signed)
  Physician Discharge Summary  Patient ID: Jerome Adams MRN: [id number] DOB/AGE: 51-Sep-1962 51 y.o.  Admit date: 04/05/2012 Discharge date: 04/06/2012  Admission Diagnoses: Left C6 radiculopathy from large ruptured disc C5-6  Discharge Diagnoses: Same Active Problems:  * No active hospital problems. *    Discharged Condition: good  Hospital Course: Patient in the hospital underwent anterior cervical discectomy fusion at C5-6 postop patient did very well recovered in the floor on the floor patient was convalescing well and living and voiding spontaneously tolerating rigid diet was a be discharged home scheduled followup in one to 2 weeks.  Consults: Significant Diagnostic Studies: Treatments: ACDF C5-6 Discharge Exam: Blood pressure 151/85, pulse 84, temperature 98.6 F (37 C), temperature source Oral, resp. rate 18, SpO2 98.00%. Strength out of 5 wound clean and dry and  Disposition: Home  Discharge Orders    Future Appointments: Provider: Department: Dept Phone: Center:   04/22/2012 10:30 AM Jonah Blue, DO [facility] [phone] [facility]     Medication List  As of 04/06/2012  7:56 AM   TAKE these medications         cyclobenzaprine 10 MG tablet   Commonly known as: FLEXERIL   Take 1 tablet (10 mg total) by mouth 3 (three) times daily as needed for muscle spasms.      gabapentin 300 MG capsule   Commonly known as: NEURONTIN   Take 300 mg by mouth 3 (three) times daily.      hydrochlorothiazide 25 MG tablet   Commonly known as: HYDRODIURIL   Take 25 mg by mouth daily.      losartan 50 MG tablet   Commonly known as: COZAAR   Take 50 mg by mouth daily.      lovastatin 40 MG tablet   Commonly known as: MEVACOR   Take 40 mg by mouth daily.      multivitamin with minerals Tabs   Take 1 tablet by mouth daily.      oxyCODONE 15 MG immediate release tablet   Commonly known as: ROXICODONE   Take 1 tablet (15 mg total) by mouth every 4 (four) hours as  needed.      oxyCODONE 15 MG immediate release tablet   Commonly known as: ROXICODONE   Take 15 mg by mouth every 8 (eight) hours as needed. For pain             Signed: Nneka Blanda P 04/06/2012, 7:56 AM

## 2012-04-06 NOTE — Progress Notes (Signed)
Pt given D/C instructions with rx's, verbal understanding given. Pt D/C'd home via wheelchair with friend per MD order. Rema Fendt, RN

## 2012-04-09 NOTE — Transfer of Care (Signed)
Immediate Anesthesia Transfer of Care Note  Patient: Jerome Adams  Procedure(s) Performed: Procedure(s) (LRB) with comments: ANTERIOR CERVICAL DECOMPRESSION/DISCECTOMY FUSION 1 LEVEL (N/A) - Cervical five-six anterior cervical decompression and fusion  Patient Location: PACU  Anesthesia Type: General  Level of Consciousness: awake, alert  and oriented  Airway & Oxygen Therapy: Patient Spontanous Breathing and Patient connected to nasal cannula oxygen  Post-op Assessment: Report given to PACU RN and Post -op Vital signs reviewed and stable  Post vital signs: Reviewed and stable  Complications: No apparent anesthesia complications

## 2012-04-22 ENCOUNTER — Ambulatory Visit (INDEPENDENT_AMBULATORY_CARE_PROVIDER_SITE_OTHER): Payer: PRIVATE HEALTH INSURANCE | Admitting: Internal Medicine

## 2012-04-22 ENCOUNTER — Encounter: Payer: Self-pay | Admitting: Internal Medicine

## 2012-04-22 VITALS — BP 115/72 | HR 84 | Temp 98.1°F | Wt 206.5 lb

## 2012-04-22 DIAGNOSIS — F329 Major depressive disorder, single episode, unspecified: Secondary | ICD-10-CM

## 2012-04-22 DIAGNOSIS — I10 Essential (primary) hypertension: Secondary | ICD-10-CM

## 2012-04-22 DIAGNOSIS — F1411 Cocaine abuse, in remission: Secondary | ICD-10-CM | POA: Insufficient documentation

## 2012-04-22 DIAGNOSIS — E785 Hyperlipidemia, unspecified: Secondary | ICD-10-CM | POA: Insufficient documentation

## 2012-04-22 DIAGNOSIS — H539 Unspecified visual disturbance: Secondary | ICD-10-CM | POA: Insufficient documentation

## 2012-04-22 DIAGNOSIS — G8929 Other chronic pain: Secondary | ICD-10-CM

## 2012-04-22 DIAGNOSIS — R030 Elevated blood-pressure reading, without diagnosis of hypertension: Secondary | ICD-10-CM

## 2012-04-22 DIAGNOSIS — K219 Gastro-esophageal reflux disease without esophagitis: Secondary | ICD-10-CM

## 2012-04-22 LAB — LIPID PANEL
Cholesterol: 192 mg/dL (ref 0–200)
HDL: 31 mg/dL — ABNORMAL LOW (ref >39)
Total CHOL/HDL Ratio: 6.2 Ratio
Triglycerides: 563 mg/dL — ABNORMAL HIGH (ref <150)

## 2012-04-22 MED ORDER — SERTRALINE HCL 50 MG PO TABS: 50.0000 mg | ORAL_TABLET | Freq: Every day | ORAL | Status: DC

## 2012-04-22 NOTE — Progress Notes (Signed)
  Subjective:    Patient ID: Jerome Adams, male    DOB: 1961-05-21, 51 y.o.   MRN: [id number]  HPI States he feels somewhat sore since his neck surgery. He states he had a stimulator placed in 07/2011 at [facility] (does not know physician's name).  He states his UE strength is better, but still has a feeling of LUE "strain".  He states the nerve pain is resolved though.  Denies dizziness, syncope, presyncope, falls.  Denies CP, SOB, N/V/D/C.  Denies any melena, hematochezia.  He states he's noticed he's had to push more when he urinates. He denies dysuria and gross hematuria.  I had a 10 minutes discussion with him regarding PSA testing and he has made an informed decision not have a PSA test drawn at this time.  He admits to having "red eyes" and at times headaches, with some occasional blurry vision. States he's been feeling depressed.  Denies any suicidal ideation.  He would like some help in the form of medication.  He admits the last time he used cocaine was years ago.  He states pain is making him depressed. He admits to a seasonal component as well. He is disappointed about being dismissed from pain management, he states he has stopped using cocaine. He states he drinks a 24 oz can of beer once weekly, perhaps.  Review of Systems The complete 13 point review of systems is otherwise negative except for that stated in the HPI.    Objective:   Physical Exam Filed Vitals:   04/22/12 1038  BP: 115/72  Pulse: 84  Temp: 98.1 F (36.7 C)   GEN: AAOx3, NAD. HEENT: EOMI, some scattered scleral injection, PERRLA, no icterus. CV: S1S2, no m/r/g, RRR. PULM: CTA bilat. ABD/GI: Soft, NT, +BS, no guarding, no distention. LE/UE: 2/4 pulses, no c/c/e. No lesions. NEURO: CN II-XII intact, no focal deficits.     Assessment & Plan:  50 yr. Old male w/ pmhx significant for ED, GERD, HL, degenerative disk disease, Hep C, HTN, s/p anterior cervical discectomy fusion at C5-6 by Dr. Wynetta Emery on 04/05/12,  hx cocaine use, presents for follow up. 1) Major Depression: Start zoloft 50 mg PO daily. He can use OTC melatonin 3 mg at night if has insomnia. 2) HTN: Well controlled. 3) Chronic Pain: UDS today and repeat in one month, if still negative will consider Rx chronic narcotic medication. 4) hx Cocaine use: he states he has quit. Will monitor. Advised on consequences. 5) GERD: States uses Zantac OTC with relief occasionally. 6) HL: Check LDL direct, he is not fasting and lipid panel. 7) Health Maintenance: Optometry exam, Had colonoscopy in 2013 and needs repeat in 5 years due to polyps. Check Lipids. -return to clinic in one month. Jonah Blue

## 2012-04-22 NOTE — Patient Instructions (Signed)
Return in 1 month. UDS today. Start taking zoloft as prescribed, call me if feeling worse. Go to optometry appointment.

## 2012-04-22 NOTE — Addendum Note (Signed)
Addended by: Angelina Ok F on: 04/22/2012 12:10 PM   Modules accepted: Orders

## 2012-04-23 ENCOUNTER — Other Ambulatory Visit: Payer: Self-pay | Admitting: Internal Medicine

## 2012-04-23 DIAGNOSIS — E785 Hyperlipidemia, unspecified: Secondary | ICD-10-CM

## 2012-04-23 LAB — PRESCRIPTION ABUSE MONITORING 15P, URINE
Amphetamine/Meth: NEGATIVE ng/mL
Barbiturate Screen, Urine: NEGATIVE ng/mL
Benzodiazepine Screen, Urine: NEGATIVE ng/mL
Buprenorphine, Urine: NEGATIVE ng/mL
Cannabinoid Scrn, Ur: NEGATIVE ng/mL
Carisoprodol, Urine: NEGATIVE ng/mL
Cocaine Metabolites: NEGATIVE ng/mL
Creatinine, Urine: 193.96 mg/dL (ref >20.0)
Fentanyl, Ur: NEGATIVE ng/mL
Meperidine, Ur: NEGATIVE ng/mL
Methadone Screen, Urine: NEGATIVE ng/mL
Propoxyphene: NEGATIVE ng/mL
Tramadol Scrn, Ur: NEGATIVE ng/mL
Zolpidem, Urine: NEGATIVE ng/mL

## 2012-04-23 NOTE — Addendum Note (Signed)
Addended by: Jonah Blue on: 04/23/2012 11:48 AM   Modules accepted: Orders

## 2012-04-24 LAB — LDL CHOLESTEROL, DIRECT: Direct LDL: 92 mg/dL

## 2012-04-26 LAB — OXYCODONE, URINE (LC/MS-MS)
Noroxycodone, Ur: 4839 ng/mL
Oxycodone, ur: 2083 ng/mL
Oxymorphone: 1160 ng/mL

## 2012-04-26 LAB — OPIATES/OPIOIDS (LC/MS-MS)
Codeine Urine: NEGATIVE ng/mL
Heroin (6-AM), UR: NEGATIVE ng/mL
Hydrocodone: NEGATIVE ng/mL
Hydromorphone: NEGATIVE ng/mL
Morphine Urine: NEGATIVE ng/mL
Norhydrocodone, Ur: NEGATIVE ng/mL
Noroxycodone, Ur: 4839 ng/mL
Oxycodone, ur: 2083 ng/mL
Oxymorphone: 1160 ng/mL

## 2012-05-31 ENCOUNTER — Other Ambulatory Visit: Payer: Self-pay | Admitting: *Deleted

## 2012-05-31 MED ORDER — LOSARTAN POTASSIUM 50 MG PO TABS: 50.0000 mg | ORAL_TABLET | Freq: Every day | ORAL | Status: DC

## 2012-05-31 NOTE — Telephone Encounter (Signed)
Pt states he ran out of Losartan 2 days ago; requesting a refill - has an appt w/Dr Kem Kays 06/03/12. Thanks

## 2012-05-31 NOTE — Addendum Note (Signed)
Addended by: Bufford Spikes on: 05/31/2012 04:09 PM   Modules accepted: Orders

## 2012-05-31 NOTE — Telephone Encounter (Signed)
Pt was called and informed of refill and to keep his appt.

## 2012-06-03 ENCOUNTER — Ambulatory Visit: Payer: PRIVATE HEALTH INSURANCE | Admitting: Internal Medicine

## 2012-07-15 ENCOUNTER — Ambulatory Visit: Payer: PRIVATE HEALTH INSURANCE | Admitting: Internal Medicine

## 2012-07-20 ENCOUNTER — Ambulatory Visit (HOSPITAL_COMMUNITY)
Admission: RE | Admit: 2012-07-20 | Discharge: 2012-07-20 | Disposition: A | Discharge status: Home / Self Care | Payer: PRIVATE HEALTH INSURANCE | Source: Ambulatory Visit | Attending: Neurosurgery | Admitting: Neurosurgery

## 2012-07-20 DIAGNOSIS — K219 Gastro-esophageal reflux disease without esophagitis: Secondary | ICD-10-CM | POA: Insufficient documentation

## 2012-07-20 DIAGNOSIS — R1310 Dysphagia, unspecified: Secondary | ICD-10-CM

## 2012-07-20 DIAGNOSIS — I10 Essential (primary) hypertension: Secondary | ICD-10-CM | POA: Insufficient documentation

## 2012-07-20 NOTE — Procedures (Signed)
Objective Swallowing Evaluation: Modified Barium Swallowing Study  Patient Details  Name: Jerome Adams MRN: [id number] Date of Birth: 09-Jun-1961  Today's Date: 07/20/2012 Time: 7829-5621 SLP Time Calculation (min): 44 min  Past Medical History:  Past Medical History  Diagnosis Date  . Back pain   . PUD (peptic ulcer disease)   . Hypertension   . Allergy   . GERD (gastroesophageal reflux disease)     takes  otc  . Sleep apnea     was tested here at [facility].Marland KitchenMarland KitchenMarland KitchenNever heard anymore about it  . Spinal cord stimulator status     has had for 1 yr--inserted by [facility]   Past Surgical History:  Past Surgical History  Procedure Date  . Back surgery     fusion  . Wrist surgery right    repair of tendons and nerves  . Foot surgery     right  . Spinal cord stimulator implant 11/14/2012  . Anterior cervical decomp/discectomy fusion 04/05/2012    Procedure: ANTERIOR CERVICAL DECOMPRESSION/DISCECTOMY FUSION 1 LEVEL;  Surgeon: Mariam Dollar, MD;  Location: [facility];  Service: Neurosurgery;  Laterality: N/A;  Cervical five-six anterior cervical decompression and fusion   HPI:  51 yo male referred by Dr Wynetta Emery for MBS secondary to dysphagia per referral.  Pt is s/p ACDF C5-C6 Sept 9, 1013 for cervical radiculopathy with large ruptured disc C5-C6.   PMH + for chronic pain, DDD, ED, Hep C, PUD, HTN, cocaine use, GERD.  Pt had a spinal cord stimulator placed in 2004 but he reports it does not help symptoms.  Pt complained of discomfort sitting in flouro chair.  Pt dysphagia symptoms present as sensation of decreased opening of throat to swallow po intake.  He denies weight loss, frequently coughing nor pulmonary infections.  Pt reports pain with swallowing intake but not secretions.  Sinus drainage reportedly increased since surgery with pt requiring frequent nose blowing.  An OTC PPI Zantac is taken by pt if he has reflux symptoms but he reports he only takes if occasionally.       Assessment /  Plan / Recommendation Clinical Impression  Dysphagia Diagnosis: Suspected primary esophageal dysphagia;Within Functional Limits Clinical impression: Pt presents with a functional oropharyngeal swallow ability without aspiration, penetration or significant stasis of any consistency tested *tablet, cracker, pudding, nectar, thin.   No cranial nerve deficits identified.  Pt's swallow was timely and strong.  Suspect pt may have a primary esophageal dysphagia but radiologist not present to confirm.    Upon multiple esophageal sweeps during testing, pt appeared with delayed clearance across consistencies in esophagus.   Appearance of tertiary contraction, backflow of liquid to thoracic esophagus WITHOUT pt awareness/sensation observed.  Warm water aided clearance of pudding/cracker and barium tablet.  Reflux symptom index given with score of 20/45 indicating high likelihood of pt having laryngopharyngeal reflux.  Provided pt with screening tool and advised he retest monthly with reflux precautions in place.  LPR may contribute to pt's globus sensation and dysphagia symptoms.  SLP educated pt to findings of MBS and he observed a clear pharynx during testing (on video screen) although he continued to sense decreased opening in pharynx. Pt later admitted to his mouth having a "tart taste."  Advised pt to follow reflux precautions (and provided in writing) given history of GERD diagnosis and follow up with his PCP or neurosurgeon.  Thanks for this referral.      Treatment Recommendation       Diet Recommendation  Regular;Thin liquid   Liquid Administration via: Straw;Cup Medication Administration: Whole meds with liquid Supervision: Patient able to self feed Compensations: Slow rate;Small sips/bites Postural Changes and/or Swallow Maneuvers: Seated upright 90 degrees;Upright 30-60 min after meal    Other  Recommendations Recommended Consults: Consider esophageal assessment   Follow Up Recommendations  None     Frequency and Duration          General Date of Onset: 07/20/12 HPI: 51 yo male referred by Dr Wynetta Emery for MBS secondary to dysphagia per referral.  Pt is s/p ACDF C5-C6 Sept 9, 1013 for cervical radiculopathy with large ruptured disc C5-C6.   PMH + for chronic pain, DDD, ED, Hep C, PUD, HTN, cocaine use, GERD.  Pt had a spinal cord stimulator placed in 2004 but he reports it does not help symptoms.  Pt complained of discomfort sitting in flouro chair.  Pt dysphagia symptoms present as sensation of decreased opening of throat to swallow po intake.  He denies weight loss, frequently coughing nor pulmonary infections.  Pt reports pain with swallowing intake but not secretions.  Sinus drainage reportedly increased since surgery with pt requiring frequent nose blowing.  An OTC PPI Zantac is taken by pt if he has reflux symptoms but he reports he only takes if occasionally.   Type of Study: Modified Barium Swallowing Study Reason for Referral: Objectively evaluate swallowing function Diet Prior to this Study: Regular;Thin liquids Temperature Spikes Noted: No Respiratory Status: Room air Behavior/Cognition: Alert;Cooperative;Pleasant mood Oral Cavity - Dentition: Adequate natural dentition Oral Motor / Sensory Function: Within functional limits Self-Feeding Abilities: Able to feed self Patient Positioning: Upright in chair Baseline Vocal Quality: Clear Volitional Cough: Strong Volitional Swallow: Able to elicit Anatomy: Within functional limits Pharyngeal Secretions: Not observed secondary MBS    Reason for Referral Objectively evaluate swallowing function   Oral Phase Oral Preparation/Oral Phase Oral Phase: WFL   Pharyngeal Phase Pharyngeal Phase Pharyngeal Phase: Within functional limits Pharyngeal Phase - Comment Pharyngeal Comment: trace base of tongue stasis in pharynx of thin that cleared with further swallow  Cervical Esophageal Phase    GO    Cervical Esophageal Phase Cervical  Esophageal Phase: [facility] Cervical Esophageal Phase - Comment Cervical Esophageal Comment: clearance through UES appeared functional without stasis    Functional Limitations: Swallowing Swallow Current Status (Z6109): At least 1 percent but less than 20 percent impaired, limited or restricted Swallow Goal Status [phone]): At least 1 percent but less than 20 percent impaired, limited or restricted Swallow Discharge Status [phone]): At least 1 percent but less than 20 percent impaired, limited or restricted    Donavan Burnet, MS [facility] SLP [phone]

## 2012-08-12 ENCOUNTER — Ambulatory Visit: Payer: PRIVATE HEALTH INSURANCE | Admitting: Internal Medicine

## 2012-09-09 ENCOUNTER — Ambulatory Visit: Payer: PRIVATE HEALTH INSURANCE | Admitting: Internal Medicine

## 2012-10-07 ENCOUNTER — Ambulatory Visit: Payer: PRIVATE HEALTH INSURANCE | Admitting: Internal Medicine

## 2012-11-14 HISTORY — PX: SPINAL CORD STIMULATOR IMPLANT: SHX2422

## 2012-12-03 ENCOUNTER — Encounter (HOSPITAL_COMMUNITY): Payer: Self-pay

## 2012-12-03 ENCOUNTER — Emergency Department (HOSPITAL_COMMUNITY)
Admission: EM | Admit: 2012-12-03 | Discharge: 2012-12-03 | Disposition: A | Discharge status: Home / Self Care | Payer: PRIVATE HEALTH INSURANCE | Attending: Emergency Medicine | Admitting: Emergency Medicine

## 2012-12-03 DIAGNOSIS — M545 Low back pain, unspecified: Secondary | ICD-10-CM | POA: Insufficient documentation

## 2012-12-03 DIAGNOSIS — Z79899 Other long term (current) drug therapy: Secondary | ICD-10-CM | POA: Insufficient documentation

## 2012-12-03 DIAGNOSIS — Z8711 Personal history of peptic ulcer disease: Secondary | ICD-10-CM | POA: Insufficient documentation

## 2012-12-03 DIAGNOSIS — Z87891 Personal history of nicotine dependence: Secondary | ICD-10-CM | POA: Insufficient documentation

## 2012-12-03 DIAGNOSIS — I10 Essential (primary) hypertension: Secondary | ICD-10-CM | POA: Insufficient documentation

## 2012-12-03 DIAGNOSIS — G8929 Other chronic pain: Secondary | ICD-10-CM | POA: Insufficient documentation

## 2012-12-03 DIAGNOSIS — R209 Unspecified disturbances of skin sensation: Secondary | ICD-10-CM | POA: Insufficient documentation

## 2012-12-03 DIAGNOSIS — Z8719 Personal history of other diseases of the digestive system: Secondary | ICD-10-CM | POA: Insufficient documentation

## 2012-12-03 MED ORDER — OXYCODONE HCL 10 MG PO TABS: 10.0000 mg | ORAL_TABLET | Freq: Four times a day (QID) | ORAL | Status: DC | PRN

## 2012-12-03 NOTE — ED Provider Notes (Signed)
  Medical screening examination/treatment/procedure(s) were performed by non-physician practitioner and as supervising physician I was immediately available for consultation/collaboration.    Gerhard Munch, MD 12/03/12 [phone]

## 2012-12-03 NOTE — ED Notes (Signed)
Pt verbalizes unerstandfing

## 2012-12-03 NOTE — ED Provider Notes (Signed)
History    This chart was scribed for Magnus Sinning, PA working with Gerhard Munch, MD by ED Scribe, Burman Nieves. This patient was seen in room WTR9/WTR9 and the patient's care was started at 4:38 PM.   CSN: [id number]  Arrival date & time 12/03/12  1217   First MD Initiated Contact with Patient 12/03/12 1638      Chief Complaint  Patient presents with  . Back Pain    (Consider location/radiation/quality/duration/timing/severity/associated sxs/prior treatment) The history is provided by the patient.   HPI Comments: Jerome Adams is a 52 y.o. male with h/o chronic back pain who presents to the Emergency Department complaining of moderate constant lower back pain which has been persistent now for several days now. Pt states that the pain is a 8/10 in his lower back and he feels an intermittent numbness and tingling sensation radiating down into his left leg. Pt states that he has a follow up with his doctor on May 29, PCP Dr. Kem Kays, and he just needs some pain medication to get him through until his appointment. Pt states he has had two back surgeries on L4 and L5 in 2012 at [facility]. After the first surgery they noticed they had accidentally put a screw in his nerve causing nerve damage. Pt states the pain never stopped hurting after his surgeries. Pt had a stimulator placed in 2013 and states that it went out. Pt denies any bladder/bowel incontinence, fever, chills, cough, nausea, vomiting, diarrhea, SOB, weakness, and any other associated symptoms. Pt has family hx of heart disease (father and sister) and breast cancer (sister).   Past Medical History  Diagnosis Date  . Back pain   . PUD (peptic ulcer disease)   . Hypertension   . Allergy   . GERD (gastroesophageal reflux disease)     takes  otc  . Sleep apnea     was tested [facility] at [facility].Marland KitchenMarland KitchenMarland KitchenNever heard anymore about it  . Spinal cord stimulator status     has had for 1 yr--inserted by [facility]    Past Surgical  History  Procedure Laterality Date  . Back surgery      fusion  . Wrist surgery  right    repair of tendons and nerves  . Foot surgery      right  . Spinal cord stimulator implant  11/14/2012  . Anterior cervical decomp/discectomy fusion  04/05/2012    Procedure: ANTERIOR CERVICAL DECOMPRESSION/DISCECTOMY FUSION 1 LEVEL;  Surgeon: Mariam Dollar, MD;  Location: [facility];  Service: Neurosurgery;  Laterality: N/A;  Cervical five-six anterior cervical decompression and fusion    Family History  Problem Relation Age of Onset  . Heart disease Father   . Heart disease Sister   . Breast cancer Sister     History  Substance Use Topics  . Smoking status: Former Smoker    Types: Cigarettes    Quit date: 03/23/2010  . Smokeless tobacco: Never Used  . Alcohol Use: No     Comment: has been one year since any alcohol      Review of Systems  Genitourinary:       No bowel or bladder incontinence  Musculoskeletal: Positive for back pain.  Neurological: Positive for numbness.  All other systems reviewed and are negative.    Allergies  Hydrocodone-acetaminophen  Home Medications   Current Outpatient Rx  Name  Route  Sig  Dispense  Refill  . diazepam (VALIUM) 5 MG tablet   Oral  Take 5 mg by mouth every 12 (twelve) hours as needed for anxiety.          . hydrochlorothiazide (HYDRODIURIL) 25 MG tablet   Oral   Take 25 mg by mouth daily.         [name] losartan (COZAAR) 50 MG tablet   Oral   Take 50 mg by mouth daily.         [name] lovastatin (MEVACOR) 40 MG tablet   Oral   Take 40 mg by mouth daily.         . Multiple Vitamin (MULITIVITAMIN WITH MINERALS) TABS   Oral   Take 1 tablet by mouth daily.         [name] oxyCODONE (OXYCONTIN) 20 MG 12 hr tablet   Oral   Take 20 mg by mouth 4 (four) times daily as needed for pain.           BP 144/96  Pulse 85  Temp(Src) 98.5 F (36.9 C) (Oral)  Resp 16  Ht 5\' 10"  (1.778 m)  Wt 201 lb 9.6 oz (91.445 kg)  BMI 28.93  kg/m2  SpO2 98%  Physical Exam  Nursing note and vitals reviewed. Constitutional: He appears well-developed and well-nourished.  HENT:  Head: Normocephalic and atraumatic.  Mouth/Throat: Oropharynx is clear and moist.  Eyes: EOM are normal. Pupils are equal, round, and reactive to light.  Neck: Normal range of motion. Neck supple.  Cardiovascular: Normal rate, regular rhythm and normal heart sounds.   Pulmonary/Chest: Effort normal and breath sounds normal. He has no wheezes.  Musculoskeletal: Normal range of motion. He exhibits tenderness.  Tenderness to palpation to the lumbar spine.   Neurological: He is alert. He has normal strength. Gait normal.  Reflex Scores:      Patellar reflexes are 2+ on the right side and 2+ on the left side.      Achilles reflexes are 2+ on the right side and 2+ on the left side. Decreased sensation of the left foot which pt says is baseline. Distal sensation intact in right foot.  Skin: Skin is warm and dry.  Psychiatric: He has a normal mood and affect. His behavior is normal.    ED Course  Procedures (including critical care time) DIAGNOSTIC STUDIES: Oxygen Saturation is 98% on room air, normal by my interpretation.    COORDINATION OF CARE:  4:53 PM Discussed ED treatment with pt and pt agrees.    Labs Reviewed - No data to display No results found.   No diagnosis found.    MDM  Patient with back pain.  No neurological deficits and normal neuro exam.  Patient can walk but states is painful.  No loss of bowel or bladder control.  No concern for cauda equina.  No fever, night sweats, weight loss, h/o cancer, IVDU.  RICE protocol and pain medicine indicated and discussed with patient.   I personally performed the services described in this documentation, which was scribed in my presence. The recorded information has been reviewed and is accurate.    Pascal Lux [place], PA-C 12/03/12 1824

## 2012-12-03 NOTE — ED Notes (Addendum)
Pt c/o increasing lower back pain x "several days."  Pain score 8/10.  Hx of back surgery on L4 and L5 in 2012.  Sts stimulator placed in 2013.  Denies injury.  Sts follow up MD appointment on May 29th.  Pt is wanting medication to "get me through until the appointment."

## 2012-12-09 ENCOUNTER — Encounter: Payer: Self-pay | Admitting: Internal Medicine

## 2012-12-09 ENCOUNTER — Ambulatory Visit (INDEPENDENT_AMBULATORY_CARE_PROVIDER_SITE_OTHER): Payer: PRIVATE HEALTH INSURANCE | Admitting: Internal Medicine

## 2012-12-09 VITALS — BP 137/87 | HR 59 | Temp 98.5°F | Ht 70.0 in | Wt 206.8 lb

## 2012-12-09 DIAGNOSIS — F1120 Opioid dependence, uncomplicated: Secondary | ICD-10-CM

## 2012-12-09 DIAGNOSIS — I10 Essential (primary) hypertension: Secondary | ICD-10-CM

## 2012-12-09 DIAGNOSIS — E785 Hyperlipidemia, unspecified: Secondary | ICD-10-CM

## 2012-12-09 DIAGNOSIS — M542 Cervicalgia: Secondary | ICD-10-CM

## 2012-12-09 DIAGNOSIS — M549 Dorsalgia, unspecified: Secondary | ICD-10-CM

## 2012-12-09 DIAGNOSIS — F32A Depression, unspecified: Secondary | ICD-10-CM

## 2012-12-09 DIAGNOSIS — F329 Major depressive disorder, single episode, unspecified: Secondary | ICD-10-CM

## 2012-12-09 LAB — COMPLETE METABOLIC PANEL WITH GFR
ALT: 25 U/L (ref 0–53)
AST: 18 U/L (ref 0–37)
Albumin: 4.6 g/dL (ref 3.5–5.2)
Alkaline Phosphatase: 70 U/L (ref 39–117)
BUN: 17 mg/dL (ref 6–23)
CO2: 26 mEq/L (ref 19–32)
Calcium: 9.3 mg/dL (ref 8.4–10.5)
Chloride: 105 mEq/L (ref 96–112)
Creat: 1.13 mg/dL (ref 0.50–1.35)
GFR, Est African American: 87 mL/min
GFR, Est Non African American: 75 mL/min
Glucose, Bld: 99 mg/dL (ref 70–99)
Potassium: 4.1 mEq/L (ref 3.5–5.3)
Sodium: 138 mEq/L (ref 135–145)
Total Bilirubin: 0.6 mg/dL (ref 0.3–1.2)
Total Protein: 6.9 g/dL (ref 6.0–8.3)

## 2012-12-09 LAB — LIPID PANEL
Cholesterol: 232 mg/dL — ABNORMAL HIGH (ref 0–200)
HDL: 32 mg/dL — ABNORMAL LOW (ref >39)
Total CHOL/HDL Ratio: 7.3 Ratio
Triglycerides: 651 mg/dL — ABNORMAL HIGH (ref <150)

## 2012-12-09 MED ORDER — SERTRALINE HCL 50 MG PO TABS: 50.0000 mg | ORAL_TABLET | Freq: Every day | ORAL | Status: DC

## 2012-12-09 NOTE — Progress Notes (Signed)
  Subjective:    Patient ID: MAJD TISSUE, male    DOB: 01-20-1961, 52 y.o.   MRN: [id number]  HPI States he had another cervical fusion in 06/2012, by a neurosurgeon he does not recall the name of.  He has been filling oxycodone since 05/2012 from Dr. Quitman Livings ABDUS as per the [place] narcotic database.  He states he went briefly to him, possibly as another PCP. I advised him that the reason I did not prescribe him narcotics was due to his substance abuse problem, he understood. I am concerned he then went to another physician to obtain narcotics. He states he is no longer using cocaine. I explained to him that due his hx, I must confirm he is clean of illicit drugs and then have him sign a contract with [place]. I will need records from Dr. Johnathan Hausen to confirm he is no longer seeing him and that he will no longer Rx narcotics to him. He states he just stopped taking zoloft and didn't refill it, but feels he benefited in that short time. He asks at the end of the visit if I am going to give him oxycodone. He complains of back pain, neck pain, though he states his neck pain is much better after surgery. He has a nonfunctioning spinal cord stimulator around L3. Denies CP, SOB, N/V/D/C.  Review of Systems Complete 12 point review of systems otherwise negative except for that stated in the HPI.    Objective:   Physical Exam Filed Vitals:   12/09/12 1140  BP: 137/87  Pulse: 59  Temp: 98.5 F (36.9 C)   GEN: AAOx3, NAD. HEENT: EOMI, PERRL, no icterus, no adenopathy. Well healed cervical right anterior incision site without surrounding erythema.  CV: S1S2, no m/r/g, bradycardic. PULM: CTA bilat. ABD/GI: Soft, NT, +BS, no guarding. LE/UE: 2/4 pulses, no c/c/e. NEURO: CN II -XII intact, no focal deficits. MS: L3 palpable spinal cord stimulator without fluid collection or surrounding erythema.     Assessment & Plan:  50 yr. Old male w/ pmhx significant for ED, GERD, HL, degenerative disk disease, Hep  C, HTN, s/p anterior cervical discectomy fusion at C5-6 by Dr. Wynetta Emery on 04/05/12, hx cocaine use, presents for follow up.  1) Major Depression: Start zoloft 50 mg PO daily again.  2) HTN: Well controlled.  3) Chronic Pain neck pain/back pain: recheck UDS. No narcotics for now. He has a spinal stimulator in, he is going to tell me who put this device. I will request records from [facility] and his local neurosurgeon. I am concerned about diversion in this gentleman, especially since he briefly went to another physician and did not tell me about it until I saw it on narcotic database of [place] to obtain narcotics. 4) hx Cocaine use: he states he has quit. Will monitor. Advised on consequences.  5) GERD: States uses Zantac OTC with relief occasionally.  6) HL: Tolerating lovastatin, no changes. Recheck lipid panel today. 7) Health Maintenance: Optometry exam, Had colonoscopy in 2013 and needs repeat in 5 years due to polyps.  -return to clinic in one month.

## 2012-12-09 NOTE — Patient Instructions (Signed)
I will need records of your recent cervical fusion. I will also need records of where you had your back surgery. Blood work today. Return 3 months.

## 2012-12-10 LAB — LDL CHOLESTEROL, DIRECT: Direct LDL: 89 mg/dL

## 2012-12-10 LAB — PRESCRIPTION ABUSE MONITORING 15P, URINE
Amphetamine/Meth: NEGATIVE ng/mL
Barbiturate Screen, Urine: NEGATIVE ng/mL
Buprenorphine, Urine: NEGATIVE ng/mL
Cannabinoid Scrn, Ur: NEGATIVE ng/mL
Carisoprodol, Urine: NEGATIVE ng/mL
Cocaine Metabolites: NEGATIVE ng/mL
Creatinine, Urine: 292.61 mg/dL (ref >20.0)
Fentanyl, Ur: NEGATIVE ng/mL
Meperidine, Ur: NEGATIVE ng/mL
Methadone Screen, Urine: NEGATIVE ng/mL
Opiate Screen, Urine: NEGATIVE ng/mL
Propoxyphene: NEGATIVE ng/mL
Tramadol Scrn, Ur: NEGATIVE ng/mL
Zolpidem, Urine: NEGATIVE ng/mL

## 2012-12-15 LAB — BENZODIAZEPINES (GC/LC/MS), URINE
Alprazolam (GC/LC/MS), ur confirm: NEGATIVE ng/mL
Alprazolam metabolite (GC/LC/MS), ur confirm: NEGATIVE ng/mL
Clonazepam metabolite (GC/LC/MS), ur confirm: NEGATIVE ng/mL
Diazepam (GC/LC/MS), ur confirm: NEGATIVE ng/mL
Estazolam (GC/LC/MS), ur confirm: NEGATIVE ng/mL
Flunitrazepam metabolite (GC/LC/MS), ur confirm: NEGATIVE ng/mL
Flurazepam metabolite (GC/LC/MS), ur confirm: NEGATIVE ng/mL
Lorazepam (GC/LC/MS), ur confirm: NEGATIVE ng/mL
Midazolam (GC/LC/MS), ur confirm: NEGATIVE ng/mL
Nordiazepam (GC/LC/MS), ur confirm: 98 ng/mL
Oxazepam (GC/LC/MS), ur confirm: 301 ng/mL
Temazepam (GC/LC/MS), ur confirm: 254 ng/mL
Triazolam metabolite (GC/LC/MS), ur confirm: NEGATIVE ng/mL

## 2012-12-15 LAB — OXYCODONE, URINE (LC/MS-MS)
Noroxycodone, Ur: 92 ng/mL
Oxycodone, ur: 75 ng/mL
Oxymorphone: NEGATIVE ng/mL

## 2012-12-23 ENCOUNTER — Encounter: Payer: PRIVATE HEALTH INSURANCE | Admitting: Internal Medicine

## 2013-02-10 ENCOUNTER — Encounter: Payer: PRIVATE HEALTH INSURANCE | Admitting: Internal Medicine

## 2013-02-11 ENCOUNTER — Other Ambulatory Visit: Payer: Self-pay | Admitting: Internal Medicine

## 2013-02-24 ENCOUNTER — Encounter: Payer: PRIVATE HEALTH INSURANCE | Admitting: Internal Medicine

## 2013-02-28 ENCOUNTER — Other Ambulatory Visit: Payer: Self-pay | Admitting: Internal Medicine

## 2013-02-28 ENCOUNTER — Ambulatory Visit: Payer: PRIVATE HEALTH INSURANCE | Admitting: Internal Medicine

## 2013-03-01 ENCOUNTER — Encounter: Payer: Self-pay | Admitting: Internal Medicine

## 2013-03-01 ENCOUNTER — Ambulatory Visit (INDEPENDENT_AMBULATORY_CARE_PROVIDER_SITE_OTHER): Payer: PRIVATE HEALTH INSURANCE | Admitting: Internal Medicine

## 2013-03-01 VITALS — BP 127/80 | HR 78 | Temp 99.0°F | Ht 70.0 in | Wt 203.3 lb

## 2013-03-01 DIAGNOSIS — K529 Noninfective gastroenteritis and colitis, unspecified: Secondary | ICD-10-CM

## 2013-03-01 DIAGNOSIS — K219 Gastro-esophageal reflux disease without esophagitis: Secondary | ICD-10-CM

## 2013-03-01 DIAGNOSIS — R5383 Other fatigue: Secondary | ICD-10-CM

## 2013-03-01 DIAGNOSIS — R5381 Other malaise: Secondary | ICD-10-CM

## 2013-03-01 DIAGNOSIS — I10 Essential (primary) hypertension: Secondary | ICD-10-CM

## 2013-03-01 DIAGNOSIS — R197 Diarrhea, unspecified: Secondary | ICD-10-CM

## 2013-03-01 DIAGNOSIS — R252 Cramp and spasm: Secondary | ICD-10-CM

## 2013-03-01 MED ORDER — PANTOPRAZOLE SODIUM 40 MG PO TBEC: 40.0000 mg | DELAYED_RELEASE_TABLET | Freq: Every day | ORAL | Status: DC

## 2013-03-01 MED ORDER — LOPERAMIDE HCL 2 MG PO CHEW: 1.0000 | CHEWABLE_TABLET | Freq: Three times a day (TID) | ORAL | Status: DC | PRN

## 2013-03-01 MED ORDER — SUCRALFATE 1 GM/10ML PO SUSP: 1.0000 g | Freq: Four times a day (QID) | ORAL | Status: DC

## 2013-03-01 NOTE — Patient Instructions (Addendum)
General Instructions: -Take Carafate liquid for your stomach.  -Take Protonix daily for your stomach.  - Take Imodium for the diarrhea.  - Follow up with [place] in 4-6 weeks.   Treatment Goals:  Goals (1 Years of Data) as of 03/01/13   None      Progress Toward Treatment Goals:  Treatment Goal 03/01/2013  Stop smoking smoking less    Self Care Goals & Plans:  Self Care Goal 03/01/2013  Manage my medications take my medicines as prescribed; bring my medications to every visit; refill my medications on time; follow the sick day instructions if I am sick  Monitor my health keep track of my blood pressure; keep track of my weight  Be physically active take a walk every day; find an activity I enjoy       Care Management & Community Referrals:  Referral 03/01/2013  Referrals made for care management support none needed

## 2013-03-02 LAB — BASIC METABOLIC PANEL WITH GFR
BUN: 21 mg/dL (ref 6–23)
CO2: 27 mEq/L (ref 19–32)
Calcium: 10.1 mg/dL (ref 8.4–10.5)
Chloride: 105 mEq/L (ref 96–112)
Creat: 1.15 mg/dL (ref 0.50–1.35)
GFR, Est African American: 85 mL/min
GFR, Est Non African American: 73 mL/min
Glucose, Bld: 95 mg/dL (ref 70–99)
Potassium: 4.2 mEq/L (ref 3.5–5.3)
Sodium: 140 mEq/L (ref 135–145)

## 2013-03-02 LAB — CK: Total CK: 426 U/L — ABNORMAL HIGH (ref 7–232)

## 2013-03-03 DIAGNOSIS — K529 Noninfective gastroenteritis and colitis, unspecified: Secondary | ICD-10-CM | POA: Insufficient documentation

## 2013-03-03 NOTE — Assessment & Plan Note (Addendum)
He has burning sensation in his stomach which is worsened by certain foods. Swallow study in 12/13 with suspected primary esophageal dysphagia but with possible reflux as well.  Rx Protonix 40 mg daily Carafate 1g suspension (per pt's request) 4 times daily If not improvement of his stomach symptoms, will consider referral to GI for endoscopic esophageal evaluation.

## 2013-03-03 NOTE — Assessment & Plan Note (Signed)
BP Readings from Last 3 Encounters:  03/01/13 127/80  12/09/12 137/87  12/03/12 144/96    Lab Results  Component Value Date   NA 140 03/01/2013   K 4.2 03/01/2013   CREATININE 1.15 03/01/2013    Assessment: Blood pressure control:  well controlled Progress toward BP goal:   at goal Comments: on amlodipine 10 mg daily, Losartan 50mg  daily, HCTZ 25 mg daily  Plan: Medications:  continue current medications Educational resources provided:   Self management tools provided:   Other plans: Follow up in 4-6 weeks.

## 2013-03-03 NOTE — Assessment & Plan Note (Addendum)
He has had frequent bowel movements of soft stools with occasional liquid stool for the past 6 years. Sometimes he has 9 BMs in one day. He states that his liquid stools occur 2 per week but have been more frequent in the past 2 weeks. No dark tarry stools, no hematochezia. He is not entirely sure if there is a trigger to his diarrhea and will monitor it more closely until his next appointment.  IBS could explain his frequent BMs with changes in consistency. Infectious etiology less likely due to the chronicity of this problem. He had screening colonoscopy by Dr. Christella Hartigan on 4/13 with 2 polyps, one tubulous adenoma, no diverticula,  and recommendation for repeat colonoscopy in 5 years. Colorectal malignancy less likely given otherwise normal colonoscopy but will consider abdominal imaging (CT) if symptoms worsen.  -Imodium as needed for liquid stools.  -Will consider stool studies during his next visit depending on the frequency of his diarrhea.

## 2013-03-03 NOTE — Progress Notes (Signed)
  Subjective:    Patient ID: Jerome Adams, male    DOB: Jul 26, 1961, 52 y.o.   MRN: [id number]  HPI Jerome Adams is 52 year old man with PMH of HTN, Hyperlipidemia, who comes in for evaluation of diarrhea for the past 4-6 years and heartburn with stomach pain for the past 6 months. He states that for years he has had multiple bowel movements per day, mostly formed but sometimes liquid. The bowel movements are not associated with food consumption are not always accompanied by abdominal cramps. He denies blood in the stool or dark tarry stools although he occasionally sees blood in the toilet paper when he wipes. He has not seen an association of his frequent stools and consumption of lactose containing foods. In addition he has had occasional stomach and esophagus "burning" sensation and cramps. Certain spicy foods trigger this stomach discomfort and he knows to avoid them. He had stomach ulcers in the 80's diagnosed by endoscopy, he does not recall what had caused his ulcers. For the past month or so he also has had increased fatigue with occasional muscle cramps in his legs.   Review of Systems  Constitutional: Positive for appetite change. Negative for fever, chills and unexpected weight change.       He is worried about eating and provoking stomach discomfort, or BM  Respiratory: Negative for cough and shortness of breath.   Cardiovascular: Negative for chest pain.  Gastrointestinal: Positive for abdominal pain, diarrhea and anal bleeding. Negative for nausea, vomiting, constipation, blood in stool and abdominal distention.  Allergic/Immunologic: Negative for food allergies.  Psychiatric/Behavioral: Negative for behavioral problems and agitation.       Objective:   Physical Exam  Nursing note and vitals reviewed. Constitutional: He is oriented to person, place, and time. He appears well-developed and well-nourished. No distress.  Overweight  HENT:  Head: Normocephalic and atraumatic.   Eyes: Conjunctivae are normal. No scleral icterus.  Cardiovascular: Normal rate.   Pulmonary/Chest: Effort normal. No respiratory distress.  Abdominal: He exhibits no distension. There is tenderness.  Epigastric tenderness  Musculoskeletal: He exhibits no edema.  Neurological: He is alert and oriented to person, place, and time.  Skin: Skin is warm and dry. He is not diaphoretic.  Psychiatric: He has a normal mood and affect. His behavior is normal.          Assessment & Plan:

## 2013-03-03 NOTE — Assessment & Plan Note (Signed)
No with muscle cramps. Checked BMET given hx of chronic diarrhea. Normal potassium noted. CK only mildly elevated.

## 2013-03-10 NOTE — Progress Notes (Signed)
Case discussed with Dr. Kennerly soon after the resident saw the patient.  We reviewed the resident's history and exam and pertinent patient test results.  I agree with the assessment, diagnosis, and plan of care documented in the resident's note. 

## 2013-03-29 ENCOUNTER — Encounter: Payer: Self-pay | Admitting: Internal Medicine

## 2013-03-29 ENCOUNTER — Ambulatory Visit (INDEPENDENT_AMBULATORY_CARE_PROVIDER_SITE_OTHER): Payer: PRIVATE HEALTH INSURANCE | Admitting: Internal Medicine

## 2013-03-29 VITALS — BP 138/87 | HR 78 | Temp 99.2°F | Ht 69.0 in | Wt 209.5 lb

## 2013-03-29 DIAGNOSIS — R197 Diarrhea, unspecified: Secondary | ICD-10-CM

## 2013-03-29 DIAGNOSIS — G4700 Insomnia, unspecified: Secondary | ICD-10-CM | POA: Insufficient documentation

## 2013-03-29 DIAGNOSIS — K529 Noninfective gastroenteritis and colitis, unspecified: Secondary | ICD-10-CM

## 2013-03-29 DIAGNOSIS — Z299 Encounter for prophylactic measures, unspecified: Secondary | ICD-10-CM

## 2013-03-29 DIAGNOSIS — R109 Unspecified abdominal pain: Secondary | ICD-10-CM

## 2013-03-29 DIAGNOSIS — G4733 Obstructive sleep apnea (adult) (pediatric): Secondary | ICD-10-CM

## 2013-03-29 LAB — COMPLETE METABOLIC PANEL WITH GFR
ALT: 18 U/L (ref 0–53)
AST: 20 U/L (ref 0–37)
Albumin: 4.7 g/dL (ref 3.5–5.2)
Alkaline Phosphatase: 66 U/L (ref 39–117)
BUN: 16 mg/dL (ref 6–23)
CO2: 30 mEq/L (ref 19–32)
Calcium: 9.8 mg/dL (ref 8.4–10.5)
Chloride: 102 mEq/L (ref 96–112)
Creat: 1.18 mg/dL (ref 0.50–1.35)
GFR, Est African American: 82 mL/min
GFR, Est Non African American: 71 mL/min
Glucose, Bld: 94 mg/dL (ref 70–99)
Potassium: 4.4 mEq/L (ref 3.5–5.3)
Sodium: 139 mEq/L (ref 135–145)
Total Bilirubin: 0.4 mg/dL (ref 0.3–1.2)
Total Protein: 7 g/dL (ref 6.0–8.3)

## 2013-03-29 LAB — LIPID PANEL
Cholesterol: 329 mg/dL — ABNORMAL HIGH (ref 0–200)
HDL: 21 mg/dL — ABNORMAL LOW (ref >39)
Total CHOL/HDL Ratio: 15.7 Ratio
Triglycerides: 2045 mg/dL — ABNORMAL HIGH (ref <150)

## 2013-03-29 LAB — LIPASE: Lipase: 144 U/L — ABNORMAL HIGH (ref 0–75)

## 2013-03-29 NOTE — Progress Notes (Addendum)
Patient ID: Jerome Adams, male   DOB: 1961-04-17, 52 y.o.   MRN: [id number] Subjective:   Patient ID: Jerome Adams male   DOB: [birth year] 52 y.o.   MRN: [id number]  CC:  Follow up visit.    [name] is a 52 y.o. man with past medical history as outlined below, who presents for a followup visit today  1. Chronic diarrhea: Patient was seen in clinic on 8/7 due to chronic diarrhea which has been going on for the past 4-6 years and heartburn with stomach pain for the past 6 months. He has had frequent bowel movements of soft stools with occasional liquid stool for the past 6 years. Sometimes he has 9 BMs in one day. He states that his liquid stools occur 2 per week but have been more frequent in the past month. No dark tarry stools, no hematochezia. He is not entirely sure if there is a trigger to his diarrhea and will monitor it more closely until his next appointment.  He had stomach ulcers in the 80's diagnosed by endoscopy, he does not recall what had caused his ulcers. He had screening colonoscopy by Dr. Christella Hartigan on 4/13 with 2 polyps, one tubulous adenoma, no diverticula,  and recommendation for repeat colonoscopy in 5 years. Patient was a given prescription for Imodium in last visit. He is also on Protonix. Patient reports that his diarrhea improved significantly. He used to have 9-10 bowel movements each day, currently he has 2-3 bowel movements each day, but his abdominal pain did not improve at all. He still has mild abdominal pain diffusely. He does not have fever or chills. His body weight has been stable.   2. Insomnia: Patient reports having had poor sleep for long time(years per patient) to he has difficulty in falling asleep. He falls asleep, he has difficulty in maintaining sleep in the night. He weak up frequently in the night. He also snores in the night. He wakes up in the morning with tightness. Today his girlfriend at that sometimes he has a body shaking movement, not  sure it is seizure or not. He had a sleep study on 11/09/11, which showed apnea-hypoxemia index (AHI) of 13 per hour. He had oxygen desaturation to a nadir of 83%. The patient was also noticed to have frequent limb jerks during the study. He has mild obstructive sleep apnea/hypopnea syndrome per Dr. Maple Hudson.  3. Back pain: Patient has a chronic back pain. He has 2 lumbar spine surgeries and 1 cervical spine surgery in the past. He has a spinal cord stimulator placed in the past, which is not working anymore. I discussed with him about possible referral to neurosurgeon, but the patient would like to go back to his previous surgeon who did a lumbar spine surgeries for him in the past. He would like to try to contact his surgeon by himself first. Patient was advised to contact us in case he cannot reach is his Careers adviser.   Past Medical History  Diagnosis Date  . Back pain   . PUD (peptic ulcer disease)   . Hypertension   . Allergy   . GERD (gastroesophageal reflux disease)     takes  otc  . Sleep apnea     was [facility] at [facility].Marland KitchenMarland KitchenMarland KitchenNever heard anymore about it  . Spinal cord stimulator status     has had for 1 yr--inserted by [facility]   Current Outpatient Prescriptions  Medication Sig Dispense Refill  . amLODipine (NORVASC) 10  MG tablet       . diazepam (VALIUM) 5 MG tablet Take 5 mg by mouth every 12 (twelve) hours as needed for anxiety.       . hydrochlorothiazide (HYDRODIURIL) 25 MG tablet Take 25 mg by mouth daily.      . Loperamide HCl 2 MG CHEW Chew 1 tablet (2 mg total) by mouth 3 (three) times daily as needed (for diarrhea).  90 tablet  2  . losartan (COZAAR) 50 MG tablet Take 50 mg by mouth daily.      [name] lovastatin (MEVACOR) 40 MG tablet Take 40 mg by mouth daily.      . Multiple Vitamin (MULITIVITAMIN WITH MINERALS) TABS Take 1 tablet by mouth daily.      [name] oxyCODONE (ROXICODONE) 15 MG immediate release tablet       . pantoprazole (PROTONIX) 40 MG tablet Take 1 tablet (40 mg total)  by mouth daily.  30 tablet  3  . sertraline (ZOLOFT) 50 MG tablet Take 1 tablet (50 mg total) by mouth daily.  30 tablet  3  . sucralfate (CARAFATE) 1 GM/10ML suspension Take 10 mLs (1 g total) by mouth 4 (four) times daily.  420 mL  0   No current facility-administered medications for this visit.   Family History  Problem Relation Age of Onset  . Heart disease Father   . Heart disease Sister   . Breast cancer Sister    History   Social History  . Marital Status: Divorced    Spouse Name: N/A    Number of Children: N/A  . Years of Education: N/A   Social History Main Topics  . Smoking status: Current Every Day Smoker -- 0.40 packs/day    Types: Cigarettes    Last Attempt to Quit: 03/23/2010  . Smokeless tobacco: Never Used  . Alcohol Use: No     Comment: has been one year since any alcohol  . Drug Use: No     Comment: STATES HE HAS CLEAN SINCE  1998  . Sexual Activity: Not on file   Other Topics Concern  . Not on file   Social History Narrative  . No narrative on file    Review of Systems: Full 14-point review of systems otherwise negative. See HPI.   Objective:  Physical Exam: There were no vitals filed for this visit.   HENT:   Head: Normocephalic and atraumatic.  Eyes: Conjunctivae are normal. No scleral icterus.  Cardiovascular: Normal rate.   Pulmonary/Chest: Effort normal. No respiratory distress.  Abdominal: He exhibits no distension. There is mild tenderness over LUQ and lower abdomen, worse over epigastric area, but no tenderness over RUQ. No guarding or rebound pain  Musculoskeletal: He exhibits no edema.  Neurological: He is alert and oriented to person, place, and time.  Skin: Skin is warm and dry. He is not diaphoretic.  Psychiatric: He has a normal mood and affect. His behavior is normal.    Assessment & Plan:  Addendum: 10:07 AM 03/30/13: Patient's lipase is elevated at 144 and triglyceride is elevated at 2045. 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 I discussed with Dr. Criselda Peaches. Patient needs to be admitted to hospital and be treated with Insulin gtt until his triglyceride lower down<500.  I called patient, but no one picked up the phone. I left message to ask patient call me back. I will call admitting team for admission.   Addendum: Abdominal pain: Patient has mild diffuse abdominal pain.  On physical examination, his abdominal pain is located LUQ and lower abdomen, but not in the RUQ, very mild, no guarding or rebound pain. It is likely related to the chronic diarrhea. He dose not have tenderness over the RUQ, indicating that it is probably not related to gallbladder, such as gallstone or cholecystitis. Other DD include: urinary tract infection, kidney stone, parasites and pancreatitis.   - Work up is pending, include CMP, CBC with differential, urinalysis with microscopic, lipid panel, FOBT cards, stool culture, stool O&P, fecal fat study and lipase. If all tests are negative, will consider image study in next visit.

## 2013-03-29 NOTE — Patient Instructions (Signed)
1. Please come back in 2 week, when all tests are done. 2. Please take all medications as prescribed.  3. If you have worsening of your symptoms or new symptoms arise, please call the clinic ([phone]), or go to the ER immediately if symptoms are severe.  You have done great job in taking all your medications. I appreciate it very much. Please continue doing that.

## 2013-03-29 NOTE — Assessment & Plan Note (Signed)
Etiology for his chronic diarrhea is not clear. Patient responded to Imodium treatment, with less frequent diarrhea now. However patient still has mildly diffused abdominal pain. She does not have tenderness over the right upper quadrant, indicating less likely due to the gallbladder issue. She had a colonoscopy on 4/14 which showed polyps. I discussed with the Dr. Meredith Pel, who suggested to do some basic lab tests and rule out the common issues, such as urinary tract infection, kidney stone, parasites and pancreatitis.  -will get following labs: CMP, CBC with differential, urinalysis with microscopic, lipid panel, FOBT cards, stool culture, stool O&P, fecal fat study and lipase. -If all test above negative, will consider image study in next visit in 2 weeks.

## 2013-03-29 NOTE — Assessment & Plan Note (Signed)
Patient insomnia is likely due to obstructive sleep apnea. He snores a lot in the night. He had an abnormal sleep study in the last year April, 2014, but without CPAP titration. His chronic diarrhea and abdominal pain may have also contributed.  - Will repeat split sleep study with CPAP titration. - Will start CPAP if confirms OSA.

## 2013-03-30 ENCOUNTER — Telehealth: Payer: Self-pay | Admitting: Internal Medicine

## 2013-03-30 ENCOUNTER — Encounter (HOSPITAL_COMMUNITY): Payer: Self-pay | Admitting: General Practice

## 2013-03-30 ENCOUNTER — Inpatient Hospital Stay (HOSPITAL_COMMUNITY)
Admission: AD | Admit: 2013-03-30 | Discharge: 2013-04-06 | DRG: 439 | Disposition: A | Discharge status: Home / Self Care | Payer: PRIVATE HEALTH INSURANCE | Source: Ambulatory Visit | Attending: Internal Medicine | Admitting: Internal Medicine

## 2013-03-30 ENCOUNTER — Other Ambulatory Visit: Payer: Self-pay | Admitting: *Deleted

## 2013-03-30 DIAGNOSIS — K859 Acute pancreatitis without necrosis or infection, unspecified: Principal | ICD-10-CM

## 2013-03-30 DIAGNOSIS — G8929 Other chronic pain: Secondary | ICD-10-CM | POA: Diagnosis present

## 2013-03-30 DIAGNOSIS — R109 Unspecified abdominal pain: Secondary | ICD-10-CM

## 2013-03-30 DIAGNOSIS — E871 Hypo-osmolality and hyponatremia: Secondary | ICD-10-CM | POA: Diagnosis present

## 2013-03-30 DIAGNOSIS — Z87891 Personal history of nicotine dependence: Secondary | ICD-10-CM

## 2013-03-30 DIAGNOSIS — K219 Gastro-esophageal reflux disease without esophagitis: Secondary | ICD-10-CM | POA: Diagnosis present

## 2013-03-30 DIAGNOSIS — K529 Noninfective gastroenteritis and colitis, unspecified: Secondary | ICD-10-CM

## 2013-03-30 DIAGNOSIS — M5137 Other intervertebral disc degeneration, lumbosacral region: Secondary | ICD-10-CM | POA: Diagnosis present

## 2013-03-30 DIAGNOSIS — E781 Pure hyperglyceridemia: Secondary | ICD-10-CM

## 2013-03-30 DIAGNOSIS — G959 Disease of spinal cord, unspecified: Secondary | ICD-10-CM | POA: Diagnosis present

## 2013-03-30 DIAGNOSIS — I10 Essential (primary) hypertension: Secondary | ICD-10-CM | POA: Diagnosis present

## 2013-03-30 DIAGNOSIS — F329 Major depressive disorder, single episode, unspecified: Secondary | ICD-10-CM

## 2013-03-30 DIAGNOSIS — F32A Depression, unspecified: Secondary | ICD-10-CM

## 2013-03-30 DIAGNOSIS — R197 Diarrhea, unspecified: Secondary | ICD-10-CM

## 2013-03-30 DIAGNOSIS — M51379 Other intervertebral disc degeneration, lumbosacral region without mention of lumbar back pain or lower extremity pain: Secondary | ICD-10-CM | POA: Diagnosis present

## 2013-03-30 DIAGNOSIS — M5106 Intervertebral disc disorders with myelopathy, lumbar region: Secondary | ICD-10-CM | POA: Diagnosis present

## 2013-03-30 DIAGNOSIS — Z8719 Personal history of other diseases of the digestive system: Secondary | ICD-10-CM | POA: Diagnosis present

## 2013-03-30 DIAGNOSIS — M5030 Other cervical disc degeneration, unspecified cervical region: Secondary | ICD-10-CM | POA: Diagnosis present

## 2013-03-30 HISTORY — DX: Other chronic pain: G89.29

## 2013-03-30 HISTORY — DX: Low back pain: M54.5

## 2013-03-30 HISTORY — DX: Major depressive disorder, single episode, unspecified: F32.9

## 2013-03-30 HISTORY — DX: Depression, unspecified: F32.A

## 2013-03-30 HISTORY — DX: Unspecified osteoarthritis, unspecified site: M19.90

## 2013-03-30 HISTORY — DX: Unspecified viral hepatitis C without hepatic coma: B19.20

## 2013-03-30 HISTORY — DX: Low back pain, unspecified: M54.50

## 2013-03-30 LAB — GLUCOSE, CAPILLARY
Glucose-Capillary: 116 mg/dL — ABNORMAL HIGH (ref 70–99)
Glucose-Capillary: 104 mg/dL — ABNORMAL HIGH (ref 70–99)
Glucose-Capillary: 129 mg/dL — ABNORMAL HIGH (ref 70–99)

## 2013-03-30 LAB — URINALYSIS, ROUTINE W REFLEX MICROSCOPIC
Bilirubin Urine: NEGATIVE
Glucose, UA: NEGATIVE mg/dL
Hgb urine dipstick: NEGATIVE
Ketones, ur: NEGATIVE mg/dL
Leukocytes, UA: NEGATIVE
Nitrite: NEGATIVE
Protein, ur: NEGATIVE mg/dL
Specific Gravity, Urine: 1.026 (ref 1.005–1.030)
Urobilinogen, UA: 0.2 mg/dL (ref 0.0–1.0)
pH: 5.5 (ref 5.0–8.0)

## 2013-03-30 LAB — BASIC METABOLIC PANEL
BUN: 11 mg/dL (ref 6–23)
CO2: 25 mEq/L (ref 19–32)
Calcium: 9.2 mg/dL (ref 8.4–10.5)
Chloride: 101 mEq/L (ref 96–112)
Creatinine, Ser: 1 mg/dL (ref 0.50–1.35)
GFR calc Af Amer: >90 mL/min (ref >90)
GFR calc non Af Amer: 85 mL/min — ABNORMAL LOW (ref >90)
Glucose, Bld: 88 mg/dL (ref 70–99)
Potassium: 4 mEq/L (ref 3.5–5.1)
Sodium: 139 mEq/L (ref 135–145)

## 2013-03-30 LAB — TRIGLYCERIDES: Triglycerides: 2263 mg/dL — ABNORMAL HIGH (ref <150)

## 2013-03-30 LAB — MRSA PCR SCREENING: MRSA by PCR: POSITIVE — AB

## 2013-03-30 MED ORDER — DEXTROSE 5 % IV SOLN
INTRAVENOUS | Status: DC
  Administered 2013-03-30: 21:00:00 via INTRAVENOUS

## 2013-03-30 MED ORDER — ONDANSETRON HCL 4 MG PO TABS: 4.0000 mg | ORAL_TABLET | Freq: Four times a day (QID) | ORAL | Status: DC | PRN

## 2013-03-30 MED ORDER — HEPARIN SODIUM (PORCINE) 5000 UNIT/ML IJ SOLN
5000.0000 [IU] | Freq: Three times a day (TID) | INTRAMUSCULAR | Status: DC
  Administered 2013-03-30 – 2013-04-06 (×16): 5000 [IU] via SUBCUTANEOUS
  Filled 2013-03-30 (×25): qty 1

## 2013-03-30 MED ORDER — DEXTROSE 10 % IV SOLN
INTRAVENOUS | Status: DC
  Administered 2013-03-30 – 2013-03-31 (×2): via INTRAVENOUS

## 2013-03-30 MED ORDER — SODIUM CHLORIDE 0.9 % IV SOLN: INTRAVENOUS | Status: DC

## 2013-03-30 MED ORDER — HYDROMORPHONE HCL PF 1 MG/ML IJ SOLN
1.0000 mg | INTRAMUSCULAR | Status: DC | PRN
  Administered 2013-03-30 – 2013-03-31 (×5): 1 mg via INTRAVENOUS
  Filled 2013-03-30 (×5): qty 1

## 2013-03-30 MED ORDER — MUPIROCIN 2 % EX OINT
1.0000 "application " | TOPICAL_OINTMENT | Freq: Two times a day (BID) | CUTANEOUS | Status: AC
  Administered 2013-03-31 – 2013-04-04 (×10): 1 via NASAL
  Filled 2013-03-30: qty 22

## 2013-03-30 MED ORDER — ONDANSETRON HCL 4 MG/2ML IJ SOLN: 4.0000 mg | Freq: Four times a day (QID) | INTRAMUSCULAR | Status: DC | PRN

## 2013-03-30 MED ORDER — CHLORHEXIDINE GLUCONATE CLOTH 2 % EX PADS
6.0000 | MEDICATED_PAD | Freq: Every day | CUTANEOUS | Status: AC
  Administered 2013-03-31 – 2013-04-04 (×5): 6 via TOPICAL

## 2013-03-30 NOTE — H&P (Signed)
Date: 03/30/2013               Patient Name:  Jerome Adams MRN: [id number]  DOB: 1960-07-29 Age / Sex: 52 y.o., male   PCP: Jonah Blue, DO         Medical Service: Internal Medicine Teaching Service         Attending Physician: Dr. Jonah Blue, DO    First Contact: Dr. Mariea Clonts Pager: [phone]  Second Contact: Dr. Sherrine Maples Pager: [phone]       After Hours (After 5p/  First Contact Pager: [phone]  weekends / holidays): Second Contact Pager: [phone]   Chief Complaint: Abdominal pain  History of Present Illness: 24 y o male with PMH of degenerative disc dx, lumber spine with myelopathy, HTN, hypertriglyceridemia, chronic diarrhoea, HCV infection. Presented today with c/o of abdominal pain intermittent for the past 6 months, but recent episode for about a week, in his upper abdomen, non radiating, rates the pain as a 4 out of 10. Assoc nausea for weeks but no vomiting. Patient also complains of loose stools for the past 4-6 years, non bloody, up to 9 episodes per day, was given imodium on his last clinic visit- 03/03/2013, and his stool freq has reduced to 2-3 episodes a day. Patient endorses alcoholic drinks on the weekends, occasionally during the week- about 2 bottles of beer. Body weight has been stable, no fever or chills, colonoscopy done 4/13 showed 2 polys- 1 tubulous adenoma, no diverticula, for follow up in 5 years. Patient presented yesterday- 9/2/2104for follow up visit, and had CMp, CBc with diff, urinalysis, lipid panel, FOBT, Stool culture, stool o&p, fecal fat study and lipase. Lipase- 144, TG- 2045, other results pending. He was told to come back here today, becuse of his markedly elevated triglycerides.  Meds: Current Facility-Administered Medications  Medication Dose Route Frequency Provider Last Rate Last Dose  . heparin injection 5,000 Units  5,000 Units Subcutaneous Q8H Genelle Gather, MD      . HYDROmorphone (DILAUDID) injection 1 mg  1 mg Intravenous Q4H PRN Genelle Gather, MD      . ondansetron [facility]) tablet 4 mg  4 mg Oral Q6H PRN Genelle Gather, MD       Or  . ondansetron [facility]) injection 4 mg  4 mg Intravenous Q6H PRN Genelle Gather, MD        Allergies: Allergies as of 03/30/2013 - Review Complete 03/30/2013  Allergen Reaction Noted  . Hydrocodone-acetaminophen Itching    Past Medical History  Diagnosis Date  . Back pain   . PUD (peptic ulcer disease)   . Hypertension   . Allergy   . GERD (gastroesophageal reflux disease)     takes  otc  . Sleep apnea     was [facility] here at [facility]Marland KitchenMarland KitchenMarland KitchenNever heard anymore about it  . Spinal cord stimulator status     has had for 1 yr--inserted by [facility]   Past Surgical History  Procedure Laterality Date  . Back surgery      fusion  . Wrist surgery  right    repair of tendons and nerves  . Foot surgery      right  . Spinal cord stimulator implant  11/14/2012  . Anterior cervical decomp/discectomy fusion  04/05/2012    Procedure: ANTERIOR CERVICAL DECOMPRESSION/DISCECTOMY FUSION 1 LEVEL;  Surgeon: Mariam Dollar, MD;  Location: [facility];  Service: Neurosurgery;  Laterality: N/A;  Cervical five-six anterior cervical decompression and fusion  Family History  Problem Relation Age of Onset  . Heart disease Father   . Heart disease Sister   . Breast cancer Sister    History   Social History  . Marital Status: Divorced    Spouse Name: N/A    Number of Children: N/A  . Years of Education: N/A   Occupational History  . Not on file.   Social History Main Topics  . Smoking status: Former Smoker -- 0.40 packs/day    Types: Cigarettes    Quit date: 03/23/2010  . Smokeless tobacco: Never Used  . Alcohol Use: No     Comment: has been one year since any alcohol  . Drug Use: No     Comment: STATES HE HAS CLEAN SINCE  1998  . Sexual Activity: Not on file   Other Topics Concern  . Not on file   Social History Narrative  . No narrative on file    Review of  Systems:  CONSTITUTIONAL- No Fever, weightloss, night sweat. SKIN- No Rash, colour changes, itching. HEAD- No Headache, or dizziness. RESPIRATORY- No Cough,or SOB. CARDIAC- No Palpitations, chest pain. URINARY- No Frequency, or dysuria. NEUROLOGIC- Numbness on his Lt leg since he had back surgery for spinal stenosis. PYSCH- No Mood changes, symptoms of  Depression or anxiety.  Physical Exam: Blood pressure 139/79, pulse 66, temperature 98.2 F (36.8 C), temperature source Oral, resp. rate 20, height 5\' 9"  (1.753 m), weight 202 lb (91.627 kg), SpO2 94.00%. . GENERAL- alert, co-operative, appears as stated age, not in any distress. HEENT- Atraumatic, normocephalic, PERRL, EOMI, oral mucosa appears moist, good and intact dentition.  CARDIAC- RRR, no murmurs, rubs or gallops. RESP- Moving equal volumes of air, and clear to auscultation bilaterally. ABDOMEN- Soft, tenderness in epigastric region, mildly in LLQ, no guarding, or rebound, no palpable masses or organomegaly, bowel sounds present. BACK- Normal curvature of the spine, No tenderness along the vertebrae, no CVA tenderness. EXTREMITIES- pulse 2+, symmetric. SKIN- Warm, dry, No rash or lesion. PSYCH- Normal mood and affect, appropriate thought content and speech.  Lab results: Basic Metabolic Panel:  Recent Labs  16/10/96 1238  NA 139  K 4.4  CL 102  CO2 30  GLUCOSE 94  BUN 16  CREATININE 1.18  CALCIUM 9.8   Liver Function Tests:  Recent Labs  03/29/13 1238  AST 20  ALT 18  ALKPHOS 66  BILITOT 0.4  PROT 7.0  ALBUMIN 4.7    Recent Labs  03/29/13 1238  LIPASE 144*   Fasting Lipid Panel:  Recent Labs  03/29/13 1238  CHOL 329*  HDL 21*  LDLCALC NOT CALC  TRIG 2045*  CHOLHDL 15.7   Urine Drug Screen: Drugs of Abuse     Component Value Date/Time   LABOPIA NEG 12/09/2012 1226   LABOPIA POSITIVE* 12/26/2007 1701   COCAINSCRNUR NEG 12/09/2012 1226   COCAINSCRNUR POSITIVE* 12/26/2007 1701   LABBENZ PPS  12/09/2012 1226   LABBENZ NONE DETECTED 12/26/2007 1701   LABBENZ NEG 03/09/2007 1244   AMPHETMU NONE DETECTED 12/26/2007 1701   AMPHETMU NEG 03/09/2007 1244   THCU NONE DETECTED 12/26/2007 1701   LABBARB NEG 12/09/2012 1226   LABBARB  Value: NONE DETECTED        DRUG SCREEN FOR MEDICAL PURPOSES ONLY.  IF CONFIRMATION IS NEEDED FOR ANY PURPOSE, NOTIFY LAB WITHIN 5 DAYS. 12/26/2007 1701    Urinalysis:  Recent Labs  03/29/13 1238  COLORURINE YELLOW  LABSPEC 1.026  PHURINE 5.5  GLUCOSEU NEG  HGBUR NEG  BILIRUBINUR NEG  KETONESUR NEG  PROTEINUR NEG  UROBILINOGEN 0.2  NITRITE NEG  LEUKOCYTESUR NEG   Imaging results:  No results found.  Assessment & Plan by Problem: Principal Problem:   Pancreatitis, acute Active Problems:   DEGENERATIVE DISC DISEASE, LUMBAR SPINE, WITH MYELOPATHY   Essential hypertension, benign   Abdominal pain   Hypertriglyceridemia  Abdominal Pain- abdominal pain, likley aetiologies are acute pancreatitis, which most likley in tis pt is due to hypertriglyceridemia, TG- >2000, considered very severe hyperTG, placing pt at highest risk for pancreatitis, also lipase mildly elevated at 144. Also consider alcohol intake as possible cause of pancreatitis,but pt does not endorse significant alcohol intake . Gall stones may be a cause of abdminal pain also and pancreatitis,consider abd uss to r/o presence of gallstones. - NPO. - Dilaudid- 1mg , Q4h for severe pain. - IVF D5 + insulin  - CBG Q2H (maintain blood sugars150-200)  - Odansetron 4mg  Q6H for vomiting - BMP Q2H. - Triglyceride levels Q12H - Consider addition of Gemfibrozil. - Consider Echo to R/o out Gallstones.  - CT imaging not necessary at this point, but consider if pts abd pain and chronic diarrhoea  fails to subside after reduction in TG levels. - Pt is also been giving SQ heparin for DVT prophylaxis, but this also helps in reducing TG levels.   Chronic Diarrhoea- Consider chronic malabsorpion due to  pancreatic insufficiency,a stool fecal fat would help determine this, Creon addition to his diet would help. Also consider Inflammatory bowel dx, but pt reports he had a colonoscopy done last year-Showed-colonoscopy done 4/13 showed 2 polys-1 tubulous adenoma, no diverticula, for follow up in 5 years. R/o ulcerative colitis but not Crohn's dx. Also consider infectious causes. Also consider IBS- predom diarrhea. - Will get stool for culture, for Ova and parasite. - stool for fecal fat.  Degenerative Disc dx, lumber spine with myelopathy- Patient has chronic back pain, has a surgeon who he follow with, has had 2 back surgeries and one neck surgery. Has radiating pain to his Left lower extremity. Pain currently controlled on oxycodone. Patient says he will like to contact his surgeon to set up an appointment.  HTN- Continue home meds- for now pt is NPO.  DVT PPx- SQ heparin and SCDS.  Code- Full.   Dispo: Disposition is deferred at this time, awaiting improvement of current medical problems. Anticipated discharge in approximately 1-2 day(s).   The patient does have a current PCP Jonah Blue, DO) and does need an [facility] hospital follow-up appointment after discharge.  The patient does not know have transportation limitations that hinder transportation to clinic appointments.  Signed: Kennis Carina, MD 03/30/2013, 5:46 PM

## 2013-03-30 NOTE — Progress Notes (Signed)
Case discussed with Dr. Niu soon after the resident saw the patient.  We reviewed the resident's history and exam and pertinent patient test results.  I agree with the assessment, diagnosis, and plan of care documented in the resident's note. 

## 2013-03-30 NOTE — Telephone Encounter (Signed)
Pt contacted by Rubbie Battiest MD Kristie Cowman) with advisement to present to ED or clinic for admission and further management of likely triglyceride induced pancreatitis.  Pt unsure if he will present today or tomorrow.  States that if he can get to hospital before 4 that he would rather go to the clinic.  Lipase     Component Value Date/Time   LIPASE 144* 03/29/2013 1238    Lipid Panel     Component Value Date/Time   CHOL 329* 03/29/2013 1238   TRIG 2045* 03/29/2013 1238   HDL 21* 03/29/2013 1238   CHOLHDL 15.7 03/29/2013 1238   VLDL NOT CALC 03/29/2013 1238   LDLCALC NOT CALC 03/29/2013 1238

## 2013-03-31 LAB — BASIC METABOLIC PANEL
BUN: 11 mg/dL (ref 6–23)
BUN: 11 mg/dL (ref 6–23)
BUN: 11 mg/dL (ref 6–23)
BUN: 11 mg/dL (ref 6–23)
BUN: 11 mg/dL (ref 6–23)
BUN: 10 mg/dL (ref 6–23)
BUN: 10 mg/dL (ref 6–23)
BUN: 10 mg/dL (ref 6–23)
CO2: 25 mEq/L (ref 19–32)
CO2: 26 mEq/L (ref 19–32)
CO2: 27 mEq/L (ref 19–32)
CO2: 28 mEq/L (ref 19–32)
CO2: 28 mEq/L (ref 19–32)
CO2: 27 mEq/L (ref 19–32)
CO2: 25 mEq/L (ref 19–32)
CO2: 26 mEq/L (ref 19–32)
Calcium: 9.4 mg/dL (ref 8.4–10.5)
Calcium: 8.9 mg/dL (ref 8.4–10.5)
Calcium: 8.9 mg/dL (ref 8.4–10.5)
Calcium: 8.9 mg/dL (ref 8.4–10.5)
Calcium: 9 mg/dL (ref 8.4–10.5)
Calcium: 8.9 mg/dL (ref 8.4–10.5)
Calcium: 9.2 mg/dL (ref 8.4–10.5)
Calcium: 9.2 mg/dL (ref 8.4–10.5)
Chloride: 100 mEq/L (ref 96–112)
Chloride: 99 mEq/L (ref 96–112)
Chloride: 97 mEq/L (ref 96–112)
Chloride: 101 mEq/L (ref 96–112)
Chloride: 96 mEq/L (ref 96–112)
Chloride: 96 mEq/L (ref 96–112)
Chloride: 95 mEq/L — ABNORMAL LOW (ref 96–112)
Chloride: 97 mEq/L (ref 96–112)
Creatinine, Ser: 0.99 mg/dL (ref 0.50–1.35)
Creatinine, Ser: 0.95 mg/dL (ref 0.50–1.35)
Creatinine, Ser: 1.01 mg/dL (ref 0.50–1.35)
Creatinine, Ser: 1.02 mg/dL (ref 0.50–1.35)
Creatinine, Ser: 1.04 mg/dL (ref 0.50–1.35)
Creatinine, Ser: 0.97 mg/dL (ref 0.50–1.35)
Creatinine, Ser: 0.95 mg/dL (ref 0.50–1.35)
Creatinine, Ser: 0.93 mg/dL (ref 0.50–1.35)
GFR calc Af Amer: >90 mL/min (ref >90)
GFR calc Af Amer: >90 mL/min (ref >90)
GFR calc Af Amer: >90 mL/min (ref >90)
GFR calc Af Amer: >90 mL/min (ref >90)
GFR calc Af Amer: >90 mL/min (ref >90)
GFR calc Af Amer: >90 mL/min (ref >90)
GFR calc Af Amer: >90 mL/min (ref >90)
GFR calc Af Amer: >90 mL/min (ref >90)
GFR calc non Af Amer: >90 mL/min (ref >90)
GFR calc non Af Amer: >90 mL/min (ref >90)
GFR calc non Af Amer: 84 mL/min — ABNORMAL LOW (ref >90)
GFR calc non Af Amer: 83 mL/min — ABNORMAL LOW (ref >90)
GFR calc non Af Amer: 81 mL/min — ABNORMAL LOW (ref >90)
GFR calc non Af Amer: >90 mL/min (ref >90)
GFR calc non Af Amer: >90 mL/min (ref >90)
GFR calc non Af Amer: >90 mL/min (ref >90)
Glucose, Bld: 140 mg/dL — ABNORMAL HIGH (ref 70–99)
Glucose, Bld: 121 mg/dL — ABNORMAL HIGH (ref 70–99)
Glucose, Bld: 112 mg/dL — ABNORMAL HIGH (ref 70–99)
Glucose, Bld: 112 mg/dL — ABNORMAL HIGH (ref 70–99)
Glucose, Bld: 108 mg/dL — ABNORMAL HIGH (ref 70–99)
Glucose, Bld: 110 mg/dL — ABNORMAL HIGH (ref 70–99)
Glucose, Bld: 121 mg/dL — ABNORMAL HIGH (ref 70–99)
Glucose, Bld: 108 mg/dL — ABNORMAL HIGH (ref 70–99)
Potassium: 3.5 mEq/L (ref 3.5–5.1)
Potassium: 3.6 mEq/L (ref 3.5–5.1)
Potassium: 3.5 mEq/L (ref 3.5–5.1)
Potassium: 3.5 mEq/L (ref 3.5–5.1)
Potassium: 3.5 mEq/L (ref 3.5–5.1)
Potassium: 3.5 mEq/L (ref 3.5–5.1)
Potassium: 3.5 mEq/L (ref 3.5–5.1)
Potassium: 3.5 mEq/L (ref 3.5–5.1)
Sodium: 138 mEq/L (ref 135–145)
Sodium: 135 mEq/L (ref 135–145)
Sodium: 134 mEq/L — ABNORMAL LOW (ref 135–145)
Sodium: 139 mEq/L (ref 135–145)
Sodium: 134 mEq/L — ABNORMAL LOW (ref 135–145)
Sodium: 134 mEq/L — ABNORMAL LOW (ref 135–145)
Sodium: 132 mEq/L — ABNORMAL LOW (ref 135–145)
Sodium: 135 mEq/L (ref 135–145)

## 2013-03-31 LAB — CBC
HCT: 42.9 % (ref 39.0–52.0)
Hemoglobin: 14.1 g/dL (ref 13.0–17.0)
MCH: 25.8 pg — ABNORMAL LOW (ref 26.0–34.0)
MCHC: 32.9 g/dL (ref 30.0–36.0)
MCV: 78.6 fL (ref 78.0–100.0)
Platelets: 171 10*3/uL (ref 150–400)
RBC: 5.46 MIL/uL (ref 4.22–5.81)
RDW: 13.5 % (ref 11.5–15.5)
WBC: 6.2 10*3/uL (ref 4.0–10.5)

## 2013-03-31 LAB — GLUCOSE, CAPILLARY
Glucose-Capillary: 107 mg/dL — ABNORMAL HIGH (ref 70–99)
Glucose-Capillary: 116 mg/dL — ABNORMAL HIGH (ref 70–99)
Glucose-Capillary: 118 mg/dL — ABNORMAL HIGH (ref 70–99)
Glucose-Capillary: 122 mg/dL — ABNORMAL HIGH (ref 70–99)
Glucose-Capillary: 122 mg/dL — ABNORMAL HIGH (ref 70–99)
Glucose-Capillary: 125 mg/dL — ABNORMAL HIGH (ref 70–99)
Glucose-Capillary: 127 mg/dL — ABNORMAL HIGH (ref 70–99)
Glucose-Capillary: 129 mg/dL — ABNORMAL HIGH (ref 70–99)
Glucose-Capillary: 130 mg/dL — ABNORMAL HIGH (ref 70–99)
Glucose-Capillary: 135 mg/dL — ABNORMAL HIGH (ref 70–99)
Glucose-Capillary: 191 mg/dL — ABNORMAL HIGH (ref 70–99)

## 2013-03-31 LAB — TRIGLYCERIDES
Triglycerides: 1885 mg/dL — ABNORMAL HIGH (ref <150)
Triglycerides: 1901 mg/dL — ABNORMAL HIGH (ref <150)

## 2013-03-31 MED ORDER — LOSARTAN POTASSIUM 50 MG PO TABS
50.0000 mg | ORAL_TABLET | Freq: Every day | ORAL | Status: DC
  Administered 2013-03-31 – 2013-04-06 (×7): 50 mg via ORAL
  Filled 2013-03-31 (×8): qty 1

## 2013-03-31 MED ORDER — PANTOPRAZOLE SODIUM 40 MG PO TBEC: 40.0000 mg | DELAYED_RELEASE_TABLET | Freq: Every day | ORAL | Status: DC

## 2013-03-31 MED ORDER — OXYCODONE HCL 5 MG PO TABS
15.0000 mg | ORAL_TABLET | ORAL | Status: DC | PRN
  Administered 2013-03-31 – 2013-04-06 (×32): 15 mg via ORAL
  Filled 2013-03-31 (×33): qty 3

## 2013-03-31 MED ORDER — DIPHENHYDRAMINE HCL 25 MG PO CAPS
25.0000 mg | ORAL_CAPSULE | Freq: Four times a day (QID) | ORAL | Status: DC | PRN
  Administered 2013-04-01 – 2013-04-03 (×2): 25 mg via ORAL
  Filled 2013-03-31 (×2): qty 1

## 2013-03-31 MED ORDER — POTASSIUM CHLORIDE CRYS ER 20 MEQ PO TBCR
40.0000 meq | EXTENDED_RELEASE_TABLET | Freq: Once | ORAL | Status: AC
  Administered 2013-03-31: 40 meq via ORAL
  Filled 2013-03-31: qty 2

## 2013-03-31 MED ORDER — INSULIN REGULAR HUMAN 100 UNIT/ML IJ SOLN
INTRAVENOUS | Status: DC
  Administered 2013-03-31 – 2013-04-01 (×2): via INTRAVENOUS
  Filled 2013-03-31 (×4): qty 500

## 2013-03-31 MED ORDER — DEXTROSE-NACL 5-0.9 % IV SOLN
INTRAVENOUS | Status: DC
  Administered 2013-03-31 – 2013-04-01 (×3): via INTRAVENOUS

## 2013-03-31 MED ORDER — HYDROCODONE-ACETAMINOPHEN 5-325 MG PO TABS
1.0000 | ORAL_TABLET | ORAL | Status: DC | PRN
  Administered 2013-03-31: 1 via ORAL
  Filled 2013-03-31: qty 1

## 2013-03-31 MED ORDER — GEMFIBROZIL 600 MG PO TABS
600.0000 mg | ORAL_TABLET | Freq: Two times a day (BID) | ORAL | Status: DC
  Administered 2013-03-31 – 2013-04-06 (×12): 600 mg via ORAL
  Filled 2013-03-31 (×15): qty 1

## 2013-03-31 MED ORDER — SERTRALINE HCL 50 MG PO TABS: 50.0000 mg | ORAL_TABLET | Freq: Every day | ORAL | Status: DC

## 2013-03-31 NOTE — Care Management Note (Addendum)
    Page 1 of 1   04/06/2013     4:46:42 PM   CARE MANAGEMENT NOTE 04/06/2013  Patient:  Jerome Adams, Jerome Adams   Account Number:  [id number]  Date Initiated:  03/31/2013  Documentation initiated by:  Letha Cape  Subjective/Objective Assessment:   dx pancreatitis  admit- lives with family. pta indep.     Action/Plan:   Anticipated DC Date:  04/06/2013   Anticipated DC Plan:  HOME/SELF CARE      DC Planning Services  CM consult      Choice offered to / List presented to:             Status of service:  Completed, signed off Medicare Important Message given?   (If response is "NO", the following Medicare IM given date fields will be blank) Date Medicare IM given:   Date Additional Medicare IM given:    Discharge Disposition:  HOME/SELF CARE  Per UR Regulation:  Reviewed for med. necessity/level of care/duration of stay  If discussed at Long Length of Stay Meetings, dates discussed:   04/04/2013    Comments:  04/05/13 15:24 Letha Cape RN, BSN [phone] patient dc to home today.  03/31/13 11:27 Letha Cape RN, BSN [phone] patient lives with spouse, pta indep.  No needs anticipated.  NCM will continue to follow for dc needs.

## 2013-03-31 NOTE — Progress Notes (Signed)
Pt CBG now 130. Katrinka Blazing, MD notified. Will continue to monitor.

## 2013-03-31 NOTE — H&P (Signed)
INTERNAL MEDICINE TEACHING SERVICE Attending Admission Note  Date: 03/31/2013  Patient name: Jerome Adams  Medical record number: [id number]  Date of birth: [birth year]    I have seen and evaluated Madaline Guthrie and discussed their care with the Residency Team.   51 yr. Old male w/ hx DDD, HTN, Hypertriglyceridemia, HCV, presented with abdominal pain.  He admits to drinking "1-2 beers" per week, but not much more.  Admits to recent diarrhea. Denies melena, hematochezia.  He was noted to have a TG level of 2045 and admitted due to a concern of acute pancreatitis. On exam, he has mild epigastric tenderness. He was started on an insulin gtt and D10W. He feels better today and his TG have decreased to 1885.  He is hungry. Advance his diet to full liquids to see if he tolerates. I would continue insulin gtt with careful monitoring of his BG until TG level is around 500. There is a concern for chronic pancreatitis, fecal elastase may be sent. May consider future imaging if pain returns or worsens in the future. He can follow up with Dr. Christella Hartigan as outpatient. Recent colonoscopy 10/2011 noted with tubular adenoma, recommended repeat colonoscopy in 2018.  Jonah Blue, DO, FACP Faculty [facility] Internal Medicine Residency Program 03/31/2013, 2:00 PM

## 2013-03-31 NOTE — Progress Notes (Signed)
Utilization review completed. Trayce Caravello, RN, BSN. 

## 2013-03-31 NOTE — Progress Notes (Signed)
Pts CBG is 116. MD on call notified. Will continue to monitor.

## 2013-03-31 NOTE — Progress Notes (Signed)
Subjective: Has been NPO, wants to eat. No other complaints. Resting comfortably in bed.   Objective: Vital signs in last 24 hours: Filed Vitals:   03/30/13 1735 03/30/13 2034 03/31/13 0622 03/31/13 1437  BP: 139/79 151/79 136/88 133/80  Pulse: 66 63 53 74  Temp: 98.2 F (36.8 C) 99.1 F (37.3 C) 98.2 F (36.8 C) 98.6 F (37 C)  TempSrc: Oral Oral Oral Oral  Resp: 20 20 18 18   Height: 5\' 9"  (1.753 m)     Weight: 202 lb (91.627 kg)     SpO2: 94% 98% 93% 97%   Weight change:  No intake or output data in the 24 hours ending 03/31/13 1554 .Physical exam-  GENERAL- alert, co-operative, appears as stated age, not in any distress.  HEENT- Atraumatic, normocephalic, PERRL, EOMI, oral mucosa moist.  CARDIAC- RRR, no murmurs, rubs or gallops.  RESP- Moving equal volumes of air, and clear to auscultation bilaterally.  ABDOMEN- Soft,mild tenderness in epigastric region, mildly in LLQ, bowel sounds present. BACK- Normal curvature of the spine, No tenderness along the vertebrae, no CVA tenderness.  EXTREMITIES- pulse 2+, symmetric.  SKIN- Warm, dry, No rash or lesion.  Lab Results: Basic Metabolic Panel:  Recent Labs Lab 03/31/13 1246 03/31/13 1437  NA 132* 135  K 3.5 3.5  CL 95* 97  CO2 25 26  GLUCOSE 121* 108*  BUN 10 10  CREATININE 0.95 0.93  CALCIUM 9.2 9.2   Liver Function Tests:  Recent Labs Lab 03/29/13 1238  AST 20  ALT 18  ALKPHOS 66  BILITOT 0.4  PROT 7.0  ALBUMIN 4.7    Recent Labs Lab 03/29/13 1238  LIPASE 144*   CBC:  Recent Labs Lab 03/31/13 0250  WBC 6.2  HGB 14.1  HCT 42.9  MCV 78.6  PLT 171   CBG:  Recent Labs Lab 03/31/13 0326 03/31/13 0519 03/31/13 0737 03/31/13 0954 03/31/13 1216 03/31/13 1432  GLUCAP 130* 116* 125* 118* 127* 122*   Fasting Lipid Panel:  Recent Labs Lab 03/29/13 1238  03/31/13 0450  CHOL 329*  --   --   HDL 21*  --   --   LDLCALC NOT CALC  --   --   TRIG 2045*  < > 1885*  CHOLHDL 15.7  --    --   < > = values in this interval not displayed.  Urinalysis:  Recent Labs Lab 03/29/13 1238  COLORURINE YELLOW  LABSPEC 1.026  PHURINE 5.5  GLUCOSEU NEG  HGBUR NEG  BILIRUBINUR NEG  KETONESUR NEG  PROTEINUR NEG  UROBILINOGEN 0.2  NITRITE NEG  LEUKOCYTESUR NEG   Micro Results: Recent Results (from the past 240 hour(s))  MRSA PCR SCREENING     Status: Abnormal   Collection Time    03/30/13  8:50 PM      Result Value Range Status   MRSA by PCR POSITIVE (*) NEGATIVE Final   Comment:            The GeneXpert MRSA Assay (FDA     approved for NASAL specimens     only), is one component of a     comprehensive MRSA colonization     surveillance program. It is not     intended to diagnose MRSA     infection nor to guide or     monitor treatment for     MRSA infections.     RESULT CALLED TO, READ BACK BY AND VERIFIED WITH:     A  [facility] RN 2244 03/30/13 A BROWNING   Studies/Results: No results found. Medications: I have reviewed the patient's current medications. Scheduled Meds: . Chlorhexidine Gluconate Cloth  6 each Topical Q0600  . gemfibrozil  600 mg Oral BID AC  . heparin  5,000 Units Subcutaneous Q8H  . mupirocin ointment  1 application Nasal BID  . potassium chloride  40 mEq Oral Once   Continuous Infusions: . dextrose 5 % 500 mL with insulin regular (NOVOLIN R,HUMULIN R) 10 Units infusion 19 mL/hr at 03/31/13 0112  . dextrose 5 % and 0.9% NaCl     PRN Meds:.HYDROcodone-acetaminophen, ondansetron (ZOFRAN) IV, ondansetron Assessment/Plan: Principal Problem:   Pancreatitis, acute Active Problems:   DEGENERATIVE DISC DISEASE, LUMBAR SPINE, WITH MYELOPATHY   Essential hypertension, benign   Abdominal pain   Hypertriglyceridemia  Hypertriglyceridemia- Presented with TG- 2263, does not endorse signif alcohol intake or family hx of similar condition. Patient started on IV D5 and IV insulin. TG levels have dropped to 1885 today.  - Cont IV Dextrose, changed to  D5/Ns, as pts Na appears to be dropping.  - Cont Iv insulin - Start Gemfibrozil- 600 BID. - Patient is already on a statin- home regimen- lovastatin 40mg  daily. - D/c BMP 2Qh, Check tomorrow morning.  Abdominal Pain - unlikely patient has acute pancreatitis, but pt was place on NPO. Epigastric pain very mild and has been present for months. Lipase- 144, TG markedly elevated placing pt at risk of pancreatitis. - Commence full liquid diet and will advance as tolerated. - Change pain meds to percocet 5-325mg  Q4H. - Possible chronic pancreatitic insufficiency- stool elastase.  HTN- Home meds- HCT and lorsatan. Will resume home antihypertensives.   Degenerative Disc dx, lumber spine with myelopathy- Patient has chronic back pain, has a surgeon who he follow with, has had 2 back surgeries and one neck surgery. Has radiating pain to his Left lower extremity. Pain currently controlled on oxycodone. Patient says he will like to contact his surgeon to set up an appointment.   DVT PPx- SQ heparin and SCDS.   Code- Full.    Dispo: Disposition is deferred at this time, awaiting improvement of current medical problems.  Anticipated discharge in approximately 2-3 day(s).   The patient does have a current PCP Jonah Blue, DO) and does need an [facility] hospital follow-up appointment after discharge.  The patient does not know have transportation limitations that hinder transportation to clinic appointments.  .Services Needed at time of discharge: Y = Yes, Blank = No PT:   OT:   RN:   Equipment:   Other:     LOS: 1 day   Jerome Carina, MD 03/31/2013, 3:54 PM

## 2013-04-01 LAB — GLUCOSE, CAPILLARY
Glucose-Capillary: 136 mg/dL — ABNORMAL HIGH (ref 70–99)
Glucose-Capillary: 153 mg/dL — ABNORMAL HIGH (ref 70–99)
Glucose-Capillary: 148 mg/dL — ABNORMAL HIGH (ref 70–99)
Glucose-Capillary: 102 mg/dL — ABNORMAL HIGH (ref 70–99)
Glucose-Capillary: 131 mg/dL — ABNORMAL HIGH (ref 70–99)

## 2013-04-01 LAB — BASIC METABOLIC PANEL
BUN: 8 mg/dL (ref 6–23)
CO2: 25 mEq/L (ref 19–32)
Calcium: 8.8 mg/dL (ref 8.4–10.5)
Chloride: 101 mEq/L (ref 96–112)
Creatinine, Ser: 0.96 mg/dL (ref 0.50–1.35)
GFR calc Af Amer: >90 mL/min (ref >90)
GFR calc non Af Amer: >90 mL/min (ref >90)
Glucose, Bld: 107 mg/dL — ABNORMAL HIGH (ref 70–99)
Potassium: 4.3 mEq/L (ref 3.5–5.1)
Sodium: 135 mEq/L (ref 135–145)

## 2013-04-01 LAB — TRIGLYCERIDES
Triglycerides: 1915 mg/dL — ABNORMAL HIGH (ref <150)
Triglycerides: 2414 mg/dL — ABNORMAL HIGH (ref <150)

## 2013-04-01 MED ORDER — SODIUM CHLORIDE 4 MEQ/ML IV SOLN
INTRAVENOUS | Status: DC
  Administered 2013-04-01 – 2013-04-06 (×6): via INTRAVENOUS
  Filled 2013-04-01 (×14): qty 1000

## 2013-04-01 MED ORDER — INSULIN REGULAR HUMAN 100 UNIT/ML IJ SOLN
INTRAVENOUS | Status: DC
  Filled 2013-04-01: qty 1000

## 2013-04-01 MED ORDER — OMEGA-3-ACID ETHYL ESTERS 1 G PO CAPS
1.0000 g | ORAL_CAPSULE | Freq: Two times a day (BID) | ORAL | Status: DC
  Administered 2013-04-01 – 2013-04-06 (×10): 1 g via ORAL
  Filled 2013-04-01 (×13): qty 1

## 2013-04-01 MED ORDER — INSULIN REGULAR HUMAN 100 UNIT/ML IJ SOLN
INTRAVENOUS | Status: DC
  Administered 2013-04-02: via INTRAVENOUS
  Filled 2013-04-01 (×4): qty 500

## 2013-04-01 MED ORDER — ZOLPIDEM TARTRATE 5 MG PO TABS
5.0000 mg | ORAL_TABLET | Freq: Every evening | ORAL | Status: DC | PRN
  Administered 2013-04-01 – 2013-04-05 (×5): 5 mg via ORAL
  Filled 2013-04-01 (×5): qty 1

## 2013-04-01 MED ORDER — ZOLPIDEM TARTRATE 5 MG PO TABS
5.0000 mg | ORAL_TABLET | Freq: Once | ORAL | Status: DC
  Filled 2013-04-01: qty 1

## 2013-04-01 NOTE — Progress Notes (Signed)
Subjective: Tolerating clears with only mild abdominal pain. Requesting sleep aid. Resting comfortably in bed.   Objective: Vital signs in last 24 hours: Filed Vitals:   03/31/13 1900 03/31/13 2242 04/01/13 0529 04/01/13 1222  BP: 145/80 150/88 135/82 127/74  Pulse:  65 59 60  Temp:  98.3 F (36.8 C) 97.4 F (36.3 C)   TempSrc:  Oral Oral   Resp:  18 18   Height:      Weight:      SpO2:  96% 99%    Weight change:   Intake/Output Summary (Last 24 hours) at 04/01/13 1232 Last data filed at 04/01/13 0900  Gross per 24 hour  Intake    358 ml  Output      0 ml  Net    358 ml   .Physical exam-  GENERAL- alert, co-operative, appears as stated age, not in any distress.  HEENT- Atraumatic, normocephalic, PERRL, EOMI, oral mucosa moist.  CARDIAC- RRR, no murmurs, rubs or gallops.  RESP- Moving equal volumes of air, and clear to auscultation bilaterally.  ABDOMEN- Soft,mild tenderness in epigastric region, mildly in LLQ, bowel sounds present. BACK- Normal curvature of the spine, No tenderness along the vertebrae. EXTREMITIES- pulse 2+, symmetric.  SKIN- Warm, dry, No rash or lesion.  Lab Results: Basic Metabolic Panel:  Recent Labs Lab 03/31/13 1437 04/01/13 0432  NA 135 135  K 3.5 4.3  CL 97 101  CO2 26 25  GLUCOSE 108* 107*  BUN 10 8  CREATININE 0.93 0.96  CALCIUM 9.2 8.8   Liver Function Tests:  Recent Labs Lab 03/29/13 1238  AST 20  ALT 18  ALKPHOS 66  BILITOT 0.4  PROT 7.0  ALBUMIN 4.7    Recent Labs Lab 03/29/13 1238  LIPASE 144*   CBC:  Recent Labs Lab 03/31/13 0250  WBC 6.2  HGB 14.1  HCT 42.9  MCV 78.6  PLT 171   CBG:  Recent Labs Lab 03/31/13 1733 03/31/13 2205 03/31/13 2345 04/01/13 0505 04/01/13 0815 04/01/13 1136  GLUCAP 107* 135* 122* 136* 153* 148*   Fasting Lipid Panel:  Recent Labs Lab 03/29/13 1238  04/01/13 0432  CHOL 329*  --   --   HDL 21*  --   --   LDLCALC NOT CALC  --   --   TRIG 2045*  < > 1915*    CHOLHDL 15.7  --   --   < > = values in this interval not displayed.  Urinalysis:  Recent Labs Lab 03/29/13 1238  COLORURINE YELLOW  LABSPEC 1.026  PHURINE 5.5  GLUCOSEU NEG  HGBUR NEG  BILIRUBINUR NEG  KETONESUR NEG  PROTEINUR NEG  UROBILINOGEN 0.2  NITRITE NEG  LEUKOCYTESUR NEG   Micro Results: Recent Results (from the past 240 hour(s))  MRSA PCR SCREENING     Status: Abnormal   Collection Time    03/30/13  8:50 PM      Result Value Range Status   MRSA by PCR POSITIVE (*) NEGATIVE Final   Comment:            The GeneXpert MRSA Assay (FDA     approved for NASAL specimens     only), is one component of a     comprehensive MRSA colonization     surveillance program. It is not     intended to diagnose MRSA     infection nor to guide or     monitor treatment for     MRSA  infections.     RESULT CALLED TO, READ BACK BY AND VERIFIED WITH:     A MOHAMMED RN 2244 03/30/13 A BROWNING   Studies/Results: No results found. Medications: I have reviewed the patient's current medications. Scheduled Meds: . Chlorhexidine Gluconate Cloth  6 each Topical Q0600  . gemfibrozil  600 mg Oral BID AC  . heparin  5,000 Units Subcutaneous Q8H  . losartan  50 mg Oral Daily  . mupirocin ointment  1 application Nasal BID  . zolpidem  5 mg Oral Once   Continuous Infusions: . dextrose 10 % 1,000 mL with sodium chloride 154 mEq infusion 150 mL/hr at 04/01/13 1213  . dextrose 5 % 500 mL with insulin regular (NOVOLIN R,HUMULIN R) 10 Units infusion 38 mL/hr at 04/01/13 1216   PRN Meds:.diphenhydrAMINE, ondansetron (ZOFRAN) IV, ondansetron, oxyCODONE, zolpidem Assessment/Plan:  Acute pancreatitis and Hypertriglyceridemia: Presented with TG- 2263, does not endorse signif alcohol intake or family hx of similar condition. Patient started on IV D5 and IV insulin. TG levels have improved but only minimally - Cont IV Dextrose, changed to D10/NS 2/2 hyponatremia.  - Cont insulin drip, increasing  dose per pharmacy, will up titrate throughtout the day to improved TG breakdown - Continue Gemfibrozil- 600 BID. - Continue home lovastatin 40mg  daily. - BMP in AM - TG q12 - Advance to bland diet - percocet 5-325mg  Q4H. - Checking fecal fat  HTN: On HCTZ and lorsatan at home. Continuing home antihypertensives.   Degenerative Disc dx, lumber spine with myelopathy: Patient has chronic back pain, has a surgeon who he follow with, has had 2 back surgeries and one neck surgery. Has radiating pain to his Left lower extremity. Pain currently controlled on oxycodone. Patient says he will like to contact his surgeon to set up an appointment.  - PT  DVT PPx: SQ heparin and SCDS.   Code: Full.    Dispo: Disposition is deferred at this time, awaiting improvement of current medical problems.  Anticipated discharge in approximately 2-3 day(s).   The patient does have a current PCP Jonah Blue, DO) and does need an [facility] hospital follow-up appointment after discharge.  The patient does not know have transportation limitations that hinder transportation to clinic appointments.  .Services Needed at time of discharge: Y = Yes, Blank = No PT:   OT:   RN:   Equipment:   Other:     LOS: 2 days   Genelle Gather, MD 04/01/2013, 12:32 PM

## 2013-04-01 NOTE — Progress Notes (Signed)
Pt reports he took 2 of his own home prescription 5 mg valium that he had in his bag. Removed pt. Bottle from room. To send to pharmacy. Dondra Spry

## 2013-04-01 NOTE — Progress Notes (Signed)
  Date: 04/01/2013  Patient name: JAICOB DIA  Medical record number: [id number]  Date of birth: 1961-06-20   This patient has been seen and the plan of care was discussed with the house staff. Please see their note for complete details. I concur with their findings with the following additions/corrections: Triglyceride level is not decreasing from yesterday, as he is not getting enough IV insulin. Increase rate of IV insulin today. Continue IV D10W. Continue to monitor BG every 2 hours. He has had NO hypoglycemia. Abdominal pain is slightly improved. Advance diet to full liquids today. Indication for insulin gtt is hypertriglyceridemia causing acute pancreatitis. Continue gemfibrozil.  Jonah Blue, DO, FACP Faculty [facility] 04/01/2013, 11:54 AM

## 2013-04-02 LAB — GLUCOSE, CAPILLARY
Glucose-Capillary: 101 mg/dL — ABNORMAL HIGH (ref 70–99)
Glucose-Capillary: 125 mg/dL — ABNORMAL HIGH (ref 70–99)
Glucose-Capillary: 127 mg/dL — ABNORMAL HIGH (ref 70–99)
Glucose-Capillary: 143 mg/dL — ABNORMAL HIGH (ref 70–99)
Glucose-Capillary: 144 mg/dL — ABNORMAL HIGH (ref 70–99)
Glucose-Capillary: 96 mg/dL (ref 70–99)

## 2013-04-02 LAB — BASIC METABOLIC PANEL
BUN: 5 mg/dL — ABNORMAL LOW (ref 6–23)
BUN: 6 mg/dL (ref 6–23)
CO2: 25 mEq/L (ref 19–32)
CO2: 25 mEq/L (ref 19–32)
Calcium: 8.9 mg/dL (ref 8.4–10.5)
Calcium: 9.1 mg/dL (ref 8.4–10.5)
Chloride: 98 mEq/L (ref 96–112)
Chloride: 99 mEq/L (ref 96–112)
Creatinine, Ser: 0.93 mg/dL (ref 0.50–1.35)
Creatinine, Ser: 1.07 mg/dL (ref 0.50–1.35)
GFR calc Af Amer: >90 mL/min (ref >90)
GFR calc Af Amer: >90 mL/min (ref >90)
GFR calc non Af Amer: >90 mL/min (ref >90)
GFR calc non Af Amer: 79 mL/min — ABNORMAL LOW (ref >90)
Glucose, Bld: 106 mg/dL — ABNORMAL HIGH (ref 70–99)
Glucose, Bld: 78 mg/dL (ref 70–99)
Potassium: 4.5 mEq/L (ref 3.5–5.1)
Potassium: 4.6 mEq/L (ref 3.5–5.1)
Sodium: 134 mEq/L — ABNORMAL LOW (ref 135–145)
Sodium: 135 mEq/L (ref 135–145)

## 2013-04-02 LAB — TRIGLYCERIDES
Triglycerides: 1971 mg/dL — ABNORMAL HIGH (ref <150)
Triglycerides: 2114 mg/dL — ABNORMAL HIGH (ref <150)

## 2013-04-02 MED ORDER — INSULIN REGULAR HUMAN 100 UNIT/ML IJ SOLN
INTRAVENOUS | Status: DC
  Administered 2013-04-02 – 2013-04-06 (×16): via INTRAVENOUS
  Filled 2013-04-02 (×40): qty 500

## 2013-04-02 NOTE — Progress Notes (Signed)
Subjective: Diet advanced to bland diet but he was eating ice cream, cake, and peanut butter. Denies abdominal pain with food. Triglycerides have increased despite increasing his insulin dose yesterday.  Objective: Vital signs in last 24 hours: Filed Vitals:   04/01/13 1222 04/01/13 1336 04/01/13 2057 04/02/13 0645  BP: 127/74 149/82 146/86 129/80  Pulse: 60 72 80 73  Temp:  97.9 F (36.6 C) 98.6 F (37 C) 98.2 F (36.8 C)  TempSrc:  Oral Oral Oral  Resp:  20 20 20   Height:      Weight:      SpO2:  95% 95% 96%   Weight change:   Intake/Output Summary (Last 24 hours) at 04/02/13 1115 Last data filed at 04/02/13 0950  Gross per 24 hour  Intake 5795.96 ml  Output      0 ml  Net 5795.96 ml   Physical exam-  Vitals reviewed. General: Sitting in a chair, NAD HEENT: PERRL, EOMI, no scleral icterus Cardiac: RRR, no rubs, murmurs or gallops Pulm: clear to auscultation bilaterally, no wheezes, rales, or rhonchi Abd: soft, nontender, nondistended, BS present Ext: warm and well perfused, no pedal edema Neuro: alert and oriented X3, cranial nerves II-XII grossly intact, strength and sensation to light touch equal in bilateral upper and lower extremities   Lab Results: Basic Metabolic Panel:  Recent Labs Lab 03/31/13 1437 04/01/13 0432  NA 135 135  K 3.5 4.3  CL 97 101  CO2 26 25  GLUCOSE 108* 107*  BUN 10 8  CREATININE 0.93 0.96  CALCIUM 9.2 8.8   Liver Function Tests:  Recent Labs Lab 03/29/13 1238  AST 20  ALT 18  ALKPHOS 66  BILITOT 0.4  PROT 7.0  ALBUMIN 4.7    Recent Labs Lab 03/29/13 1238  LIPASE 144*   CBC:  Recent Labs Lab 03/31/13 0250  WBC 6.2  HGB 14.1  HCT 42.9  MCV 78.6  PLT 171   CBG:  Recent Labs Lab 04/01/13 1136 04/01/13 1719 04/01/13 1950 04/02/13 0001 04/02/13 0405 04/02/13 0759  GLUCAP 148* 102* 131* 144* 96 125*   Fasting Lipid Panel:  Recent Labs Lab 03/29/13 1238  04/01/13 1912  CHOL 329*  --   --     HDL 21*  --   --   LDLCALC NOT CALC  --   --   TRIG 2045*  < > 2414*  CHOLHDL 15.7  --   --   < > = values in this interval not displayed.  Urinalysis:  Recent Labs Lab 03/29/13 1238  COLORURINE YELLOW  LABSPEC 1.026  PHURINE 5.5  GLUCOSEU NEG  HGBUR NEG  BILIRUBINUR NEG  KETONESUR NEG  PROTEINUR NEG  UROBILINOGEN 0.2  NITRITE NEG  LEUKOCYTESUR NEG   Micro Results: Recent Results (from the past 240 hour(s))  MRSA PCR SCREENING     Status: Abnormal   Collection Time    03/30/13  8:50 PM      Result Value Range Status   MRSA by PCR POSITIVE (*) NEGATIVE Final   Comment:            The GeneXpert MRSA Assay (FDA     approved for NASAL specimens     only), is one component of a     comprehensive MRSA colonization     surveillance program. It is not     intended to diagnose MRSA     infection nor to guide or     monitor treatment for  MRSA infections.     RESULT CALLED TO, READ BACK BY AND VERIFIED WITH:     A MOHAMMED RN 2244 03/30/13 A BROWNING   Studies/Results: No results found. Medications: I have reviewed the patient's current medications. Scheduled Meds: . Chlorhexidine Gluconate Cloth  6 each Topical Q0600  . gemfibrozil  600 mg Oral BID AC  . heparin  5,000 Units Subcutaneous Q8H  . losartan  50 mg Oral Daily  . mupirocin ointment  1 application Nasal BID  . omega-3 acid ethyl esters  1 g Oral BID  . zolpidem  5 mg Oral Once   Continuous Infusions: . dextrose 10 % 1,000 mL with sodium chloride 154 mEq infusion 50 mL/hr at 04/01/13 1935  . dextrose 10 % 500 mL with insulin regular (NOVOLIN R,HUMULIN R) 16 Units infusion     PRN Meds:.diphenhydrAMINE, ondansetron (ZOFRAN) IV, ondansetron, oxyCODONE, zolpidem  Assessment/Plan:  Acute pancreatitis 2/2 hypertriglyceridemia: Presented with TG- 2263, does not endorse signif alcohol intake or significant family history. Patient started on IV D5 and IV insulin. TG levels improved only minimally, so changed  to D10 with increased insulin dose, to keep CBGs w/in normal limits. Lovaza started on 9/5 to compliment the gemfibrozil and statin. Unfortunately, overnight his TGs actually increased to greater than 2400, which is higher than when he was admitted. Increasing the insulin to 16u in 500cc D10 and increasing the rate to 100cc/hr.   - Cont insulin drip, increasing dose, will cont to up titrate throughtout the day to improved TG breakdown - Cont D10 w/ NS 2/2 hyponatremia.  - Continue Gemfibrozil- 600 BID. - Continue home lovastatin 40mg  daily. - Continue Lovaza - BMP BID - TG q12 - Change diet to heart healthy, low fat, bland diet - percocet 5-325mg  Q4H. - Fecal fat pending  HTN: BP stable. On HCTZ and lorsatan at home, which were continued on admission.   Degenerative Disc dx and lumber spinal pain with myelopathy: Patient has chronic back pain, s/p 2 lower spinal surgeries and one cervical surgery. Has pain radiating to his LLE. Pain currently controlled on oxycodone. PT consdutled and request outpatient PT.  - Continue pain control - Continue PT  DVT PPx: SQ heparin and SCDS.   Code: Full.    Dispo: Disposition is deferred at this time, awaiting improvement of current medical problems.  Anticipated discharge in approximately 2-3 day(s).   The patient does have a current PCP Jonah Blue, DO) and does need an [facility] hospital follow-up appointment after discharge.  The patient does not know have transportation limitations that hinder transportation to clinic appointments.  .Services Needed at time of discharge: Y = Yes, Blank = No PT:   OT:   RN:   Equipment:   Other:     LOS: 3 days   Genelle Gather, MD 04/02/2013, 11:15 AM

## 2013-04-02 NOTE — Evaluation (Signed)
Physical Therapy Evaluation Patient Details Name: Jerome Adams MRN: [id number] DOB: 1960-10-25 Today's Date: 04/02/2013 Time: 9147-8295 PT Time Calculation (min): 30 min  PT Assessment / Plan / Recommendation History of Present Illness   52 y o male with PMH of degenerative disc dx, lumber spine with myelopathy, HTN, hypertriglyceridemia, chronic diarrhoea, HCV infection. Presented today with c/o of abdominal pain intermittent for the past 6 months, but recent episode for about a week, in his upper abdomen, non radiating, rates the pain as a 4 out of 10. Assoc nausea for weeks but no vomiting. Patient also complains of loose stools for the past 4-6 years, non bloody, up to 9 episodes per day, was given imodium on his last clinic visit- 03/03/2013, and his stool freq has reduced to 2-3 episodes a day. Patient endorses alcoholic drinks on the weekends, occasionally during the week- about 2 bottles of beer. Body weight has been stable, no fever or chills, colonoscopy done 4/13 showed 2 polys- 1 tubulous adenoma, no diverticula, for follow up in 5 years. Patient presented yesterday- 9/2/2104for follow up visit, and had CMp, CBc with diff, urinalysis, lipid panel, FOBT, Stool culture, stool o&p, fecal fat study and lipase. Lipase- 144, TG- 2045, other results pending. He was told to come back here today, becuse of his markedly elevated triglycerides.  Clinical Impression  Pt admitted with the above. Pt currently with functional limitations due to the deficits listed below (see PT Problem List). Pt very interested in Physical Therapy for further strengthening due to chronic back pain with hx of permanent TENs unit however not working.  Educated pt on following up with MD about TENs unit and with recommend OPPT.  Also pt demonstrated on core strengthening exercises as well Bridging for overall strengthening.  Pt will benefit from skilled PT to increase their independence and safety with mobility to allow  discharge to the venue listed below.     PT Assessment  Patient needs continued PT services    Follow Up Recommendations  Outpatient PT (for LBP if MD recommends)    Equipment Recommendations  None recommended by PT    Frequency Min 3X/week    Precautions / Restrictions Precautions Precautions: None   Pertinent Vitals/Pain C/o back pain but does not rate; more stiffness than actual pain      Mobility  Bed Mobility Bed Mobility: Supine to Sit;Sit to Supine Supine to Sit: 6: Modified independent (Device/Increase time);With rails Sit to Supine: 6: Modified independent (Device/Increase time);With rail Transfers Transfers: Sit to Stand;Stand to Sit Sit to Stand: 5: Supervision;From bed Stand to Sit: 5: Supervision;To bed Ambulation/Gait Ambulation/Gait Assistance: 4: Min guard Ambulation Distance (Feet): 300 Feet Assistive device: None Ambulation/Gait Assistance Details: supervision for safety with occasional unsteady gait Gait Pattern: Step-through pattern;Antalgic;Decreased trunk rotation Stairs: No    Exercises Total Joint Exercises Bridges: Supine;5 reps (Hold for 5 seconds) Other Exercises Other Exercises: Pt performed core stabilization exercises holding for 5 seconds x 10 with  VCs to prevent holding breath   PT Diagnosis: Difficulty walking;Acute pain  PT Problem List: Decreased strength;Decreased activity tolerance;Decreased balance;Decreased mobility;Pain PT Treatment Interventions: DME instruction;Gait training;Functional mobility training;Therapeutic activities;Therapeutic exercise;Balance training;Patient/family education     PT Goals(Current goals can be found in the care plan section) Acute Rehab PT Goals Patient Stated Goal: More therapy for my back. PT Goal Formulation: With patient Time For Goal Achievement: 04/09/13 Potential to Achieve Goals: Good  Visit Information  Last PT Received On: 04/02/13 Assistance Needed: +1 History of  Present  Illness:  26 y o male with PMH of degenerative disc dx, lumber spine with myelopathy, HTN, hypertriglyceridemia, chronic diarrhoea, HCV infection. Presented today with c/o of abdominal pain intermittent for the past 6 months, but recent episode for about a week, in his upper abdomen, non radiating, rates the pain as a 4 out of 10. Assoc nausea for weeks but no vomiting. Patient also complains of loose stools for the past 4-6 years, non bloody, up to 9 episodes per day, was given imodium on his last clinic visit- 03/03/2013, and his stool freq has reduced to 2-3 episodes a day. Patient endorses alcoholic drinks on the weekends, occasionally during the week- about 2 bottles of beer. Body weight has been stable, no fever or chills, colonoscopy done 4/13 showed 2 polys- 1 tubulous adenoma, no diverticula, for follow up in 5 years. Patient presented yesterday- 9/2/2104for follow up visit, and had CMp, CBc with diff, urinalysis, lipid panel, FOBT, Stool culture, stool o&p, fecal fat study and lipase. Lipase- 144, TG- 2045, other results pending. He was told to come back here today, becuse of his markedly elevated triglycerides.       Prior Functioning  Home Living Family/patient expects to be discharged to:: Private residence Living Arrangements: Alone Available Help at Discharge: Friend(s) Type of Home: Apartment Home Access: Level entry Home Layout: One level Home Equipment: Cane - single point Prior Function Level of Independence: Needs assistance Gait / Transfers Assistance Needed: Occasional use of cane ADL's / Homemaking Assistance Needed: HH aide- M-F for 3 hours assist cleaning, cooking & bathing; assisting to get in/out of tub and washing more difficult places Comments: Pt's on disability Communication Communication: No difficulties Dominant Hand: Right    Cognition  Cognition Arousal/Alertness: Awake/alert Behavior During Therapy: WFL for tasks assessed/performed Overall Cognitive Status:  Within Functional Limits for tasks assessed    Extremity/Trunk Assessment Lower Extremity Assessment Lower Extremity Assessment: Overall WFL for tasks assessed   Balance    End of Session PT - End of Session Equipment Utilized During Treatment: Gait belt Activity Tolerance: Patient tolerated treatment well Patient left: in bed;with call bell/phone within reach Nurse Communication: Mobility status  GP     Cora Brierley 04/02/2013, 11:03 AM  Jake Shark, PT DPT [phone]

## 2013-04-03 LAB — GLUCOSE, CAPILLARY
Glucose-Capillary: 147 mg/dL — ABNORMAL HIGH (ref 70–99)
Glucose-Capillary: 133 mg/dL — ABNORMAL HIGH (ref 70–99)
Glucose-Capillary: 112 mg/dL — ABNORMAL HIGH (ref 70–99)
Glucose-Capillary: 104 mg/dL — ABNORMAL HIGH (ref 70–99)
Glucose-Capillary: 144 mg/dL — ABNORMAL HIGH (ref 70–99)
Glucose-Capillary: 119 mg/dL — ABNORMAL HIGH (ref 70–99)

## 2013-04-03 LAB — BASIC METABOLIC PANEL
BUN: 4 mg/dL — ABNORMAL LOW (ref 6–23)
BUN: 4 mg/dL — ABNORMAL LOW (ref 6–23)
CO2: 20 mEq/L (ref 19–32)
CO2: 28 mEq/L (ref 19–32)
Calcium: 8.9 mg/dL (ref 8.4–10.5)
Calcium: 9.4 mg/dL (ref 8.4–10.5)
Chloride: 97 mEq/L (ref 96–112)
Chloride: 98 mEq/L (ref 96–112)
Creatinine, Ser: 1 mg/dL (ref 0.50–1.35)
Creatinine, Ser: 1.01 mg/dL (ref 0.50–1.35)
GFR calc Af Amer: >90 mL/min (ref >90)
GFR calc Af Amer: >90 mL/min (ref >90)
GFR calc non Af Amer: 85 mL/min — ABNORMAL LOW (ref >90)
GFR calc non Af Amer: 84 mL/min — ABNORMAL LOW (ref >90)
Glucose, Bld: 111 mg/dL — ABNORMAL HIGH (ref 70–99)
Glucose, Bld: 107 mg/dL — ABNORMAL HIGH (ref 70–99)
Potassium: 4.2 mEq/L (ref 3.5–5.1)
Potassium: 4.5 mEq/L (ref 3.5–5.1)
Sodium: 134 mEq/L — ABNORMAL LOW (ref 135–145)
Sodium: 136 mEq/L (ref 135–145)

## 2013-04-03 LAB — TRIGLYCERIDES
Triglycerides: 1365 mg/dL — ABNORMAL HIGH (ref <150)
Triglycerides: 1448 mg/dL — ABNORMAL HIGH (ref <150)

## 2013-04-03 MED ORDER — POLYETHYLENE GLYCOL 3350 17 G PO PACK
17.0000 g | PACK | Freq: Once | ORAL | Status: AC
  Administered 2013-04-03: 17 g via ORAL
  Filled 2013-04-03 (×2): qty 1

## 2013-04-03 NOTE — Progress Notes (Signed)
Subjective: Patient diet modified. No vomiting, or  Diarrhea. No episodes of diarrhea since admission. Says his stools are now really hard and he is constipated. Says Abominal pain has markedly reduced.   Objective: Vital signs in last 24 hours: Filed Vitals:   04/02/13 0645 04/02/13 1528 04/02/13 2209 04/03/13 0428  BP: 129/80 158/93 146/88 144/89  Pulse: 73 66 75 67  Temp: 98.2 F (36.8 C) 98.7 F (37.1 C) 98.6 F (37 C) 98 F (36.7 C)  TempSrc: Oral Oral Oral Oral  Resp: 20 18 17 17   Height:      Weight:      SpO2: 96% 93% 96% 95%   Weight change:   Intake/Output Summary (Last 24 hours) at 04/03/13 1050 Last data filed at 04/03/13 0730  Gross per 24 hour  Intake 5330.66 ml  Output      0 ml  Net 5330.66 ml   Physical Exam-  GENERAL- drowsy, says he didn't have much sleep last nigt, co-operative, appears as stated age, not in any distress. HEENT- Atraumatic, normocephalic, PERRL, EOMI, oral mucosa appears moist. CARDIAC- RRR, no murmurs, rubs or gallops. RESP- Moving equal volumes of air, and clear to auscultation bilaterally. ABDOMEN- Soft,non tender, no guarding, no rebound, no palpable masses or organomegaly, bowel sounds present. BACK- Normal curvature of the spine, No tenderness along the vertebrae. NEURO- No obvious Cr n abn, strenght equal and present in all extremities. EXTREMITIES- pulse 2+, symmetric. PSYCH- Normal mood and affect, appropriate thought content and speech.  Lab Results: Basic Metabolic Panel:  Recent Labs Lab 04/02/13 1700 04/03/13 0715  NA 135 134*  K 4.6 4.2  CL 99 97  CO2 25 20  GLUCOSE 78 111*  BUN 6 4*  CREATININE 1.07 1.00  CALCIUM 9.1 8.9   Liver Function Tests:  Recent Labs Lab 03/29/13 1238  AST 20  ALT 18  ALKPHOS 66  BILITOT 0.4  PROT 7.0  ALBUMIN 4.7    Recent Labs Lab 03/29/13 1238  LIPASE 144*   CBC:  Recent Labs Lab 03/31/13 0250  WBC 6.2  HGB 14.1  HCT 42.9  MCV 78.6  PLT 171    CBG:  Recent Labs Lab 04/02/13 1142 04/02/13 1548 04/02/13 2013 04/03/13 0006 04/03/13 0427 04/03/13 0752  GLUCAP 127* 101* 143* 147* 133* 112*   Fasting Lipid Panel:  Recent Labs Lab 03/29/13 1238  04/03/13 0530  CHOL 329*  --   --   HDL 21*  --   --   LDLCALC NOT CALC  --   --   TRIG 2045*  < > 1365*  CHOLHDL 15.7  --   --   < > = values in this interval not displayed.  Urinalysis:  Recent Labs Lab 03/29/13 1238  COLORURINE YELLOW  LABSPEC 1.026  PHURINE 5.5  GLUCOSEU NEG  HGBUR NEG  BILIRUBINUR NEG  KETONESUR NEG  PROTEINUR NEG  UROBILINOGEN 0.2  NITRITE NEG  LEUKOCYTESUR NEG    Micro Results: Recent Results (from the past 240 hour(s))  MRSA PCR SCREENING     Status: Abnormal   Collection Time    03/30/13  8:50 PM      Result Value Range Status   MRSA by PCR POSITIVE (*) NEGATIVE Final   Comment:            The GeneXpert MRSA Assay (FDA     approved for NASAL specimens     only), is one component of a     comprehensive  MRSA colonization     surveillance program. It is not     intended to diagnose MRSA     infection nor to guide or     monitor treatment for     MRSA infections.     RESULT CALLED TO, READ BACK BY AND VERIFIED WITH:     A MOHAMMED RN 2244 03/30/13 A BROWNING    Medications: I have reviewed the patient's current medications. Scheduled Meds: . Chlorhexidine Gluconate Cloth  6 each Topical Q0600  . gemfibrozil  600 mg Oral BID AC  . heparin  5,000 Units Subcutaneous Q8H  . losartan  50 mg Oral Daily  . mupirocin ointment  1 application Nasal BID  . omega-3 acid ethyl esters  1 g Oral BID  . zolpidem  5 mg Oral Once   Continuous Infusions: . dextrose 10 % 1,000 mL with sodium chloride 154 mEq infusion 50 mL/hr at 04/01/13 1935  . dextrose 10 % 500 mL with insulin regular (NOVOLIN R,HUMULIN R) 16 Units infusion 100 mL/hr at 04/03/13 0426   PRN Meds:.diphenhydrAMINE, ondansetron (ZOFRAN) IV, ondansetron, oxyCODONE,  zolpidem Assessment/Plan: Principal Problem:   Pancreatitis, acute Active Problems:   DEGENERATIVE DISC DISEASE, LUMBAR SPINE, WITH MYELOPATHY   Essential hypertension, benign   Abdominal pain   Hypertriglyceridemia  Pancreatitis- Acute 2/2 to hypertriglyceridemia - Patient reports marked improvement in severity of abdominal pain. Tolerating oral diet. TG on admission- 2263. TG this morning- 1365.  Tg increased to 2414 after 2 days on admission on Iv D5/D10 and Insulin, but most likely due to diet that pt was having in the hospital which was high in lipids. Diet was modified, and more aggressive approach to treatment with increased insulin to 16u and changing D10 to prevent hypoglycemia at 144mls/hr and N/s to prevent hyponatremia.. Gemfibrozil and lovaza also in treatment regimen. Blood glucose this morning- 112, Na- 134.  - Cont Insulin and D10. - Continue Gemfibrozil- 600 BID.  - Continue home lovastatin 40mg  daily.  - Continue Lovaza  - BMP BID  - TG q12  - Cont diet to heart healthy, low fat, bland diet  - percocet 5-325mg  Q4H.  - Fecal fat pending- No episodes of loose stools since admission. - Counsel pt on dietary modification on discharge. - Miralax- Complaints of constipation for one dose- likley due to opiods.  DEGENERATIVE DISC DISEASE, LUMBAR SPINE, WITH MYELOPATHY- Patient has chronic back pain, s/p 2 lower spinal surgeries and one cervical surgery. Has pain radiating to his LLE. Pain currently controlled on oxycodone. PT consulted and request outpatient PT.  - Continue pain control  - Continue PT.  DVT PPx: SQ heparin and SCDS.    Code: Full.    Dispo: Disposition is deferred at this time, awaiting improvement of current medical problems.  Anticipated discharge in approximately 1-2 day(s).   The patient does have a current PCP Jonah Blue, DO) and does need an [facility] hospital follow-up appointment after discharge.  The patient does not know have transportation  limitations that hinder transportation to clinic appointments.  .Services Needed at time of discharge: Y = Yes, Blank = No PT:   OT:   RN:   Equipment:   Other:     LOS: 4 days   Kennis Carina, MD 04/03/2013, 10:50 AM

## 2013-04-03 NOTE — Progress Notes (Signed)
Patient verbalizes need for nutritional consult. Patient states eats out regularly and eats everything fried. Nutritional Consult ordered.

## 2013-04-04 LAB — BASIC METABOLIC PANEL
BUN: <3 mg/dL — ABNORMAL LOW (ref 6–23)
BUN: <3 mg/dL — ABNORMAL LOW (ref 6–23)
CO2: 26 mEq/L (ref 19–32)
CO2: 27 mEq/L (ref 19–32)
Calcium: 9.1 mg/dL (ref 8.4–10.5)
Calcium: 9.4 mg/dL (ref 8.4–10.5)
Chloride: 98 mEq/L (ref 96–112)
Chloride: 98 mEq/L (ref 96–112)
Creatinine, Ser: 0.99 mg/dL (ref 0.50–1.35)
Creatinine, Ser: 0.95 mg/dL (ref 0.50–1.35)
GFR calc Af Amer: >90 mL/min (ref >90)
GFR calc Af Amer: >90 mL/min (ref >90)
GFR calc non Af Amer: >90 mL/min (ref >90)
GFR calc non Af Amer: >90 mL/min (ref >90)
Glucose, Bld: 190 mg/dL — ABNORMAL HIGH (ref 70–99)
Glucose, Bld: 124 mg/dL — ABNORMAL HIGH (ref 70–99)
Potassium: 4 mEq/L (ref 3.5–5.1)
Potassium: 4.2 mEq/L (ref 3.5–5.1)
Sodium: 136 mEq/L (ref 135–145)
Sodium: 136 mEq/L (ref 135–145)

## 2013-04-04 LAB — GLUCOSE, CAPILLARY
Glucose-Capillary: 103 mg/dL — ABNORMAL HIGH (ref 70–99)
Glucose-Capillary: 108 mg/dL — ABNORMAL HIGH (ref 70–99)
Glucose-Capillary: 108 mg/dL — ABNORMAL HIGH (ref 70–99)
Glucose-Capillary: 130 mg/dL — ABNORMAL HIGH (ref 70–99)
Glucose-Capillary: 155 mg/dL — ABNORMAL HIGH (ref 70–99)
Glucose-Capillary: 92 mg/dL (ref 70–99)

## 2013-04-04 LAB — TRIGLYCERIDES: Triglycerides: 1324 mg/dL — ABNORMAL HIGH (ref <150)

## 2013-04-04 NOTE — Plan of Care (Signed)
Problem: Food- and Nutrition-Related Knowledge Deficit (NB-1.1) Goal: Nutrition education Formal process to instruct or train a patient/client in a skill or to impart knowledge to help patients/clients voluntarily manage or modify food choices and eating behavior to maintain or improve health. Outcome: Completed/Met Date Met:  04/04/13 Nutrition Education Note  RD consulted for nutrition education regarding a low triglyceride diet.   Lipid Panel     Component Value Date/Time    CHOL 329* 03/29/2013 1238    TRIG 1324* 04/04/2013 0420    HDL 21* 03/29/2013 1238    CHOLHDL 15.7 03/29/2013 1238    VLDL NOT CALC 03/29/2013 1238    LDLCALC NOT CALC 03/29/2013 1238    RD provided "High trigylcerides" handout from the Academy of [employer]. Reviewed patient's dietary recall. Provided examples on ways to decrease carbohydrate and fat intake in diet. Discouraged intake of processed foods and fatty cuts of meat. Encouraged fresh fruits and vegetables as well as whole grain sources of carbohydrates to maximize fiber intake. Teach back method used.  Expect fair compliance.  Body mass index is 29.82 kg/(m^2). Pt meets criteria for overweight based on current BMI.  Current diet order is full liquid, patient is consuming approximately 100% of meals at this time. Labs and medications reviewed. No further nutrition interventions warranted at this time. RD contact information provided. If additional nutrition issues arise, please re-consult RD.  Clarene Duke RD, LDN Pager [phone] After Hours pager [phone]

## 2013-04-04 NOTE — Progress Notes (Signed)
  Date: 04/04/2013  Patient name: Jerome Adams  Medical record number: [id number]  Date of birth: [birth year]   This patient has been seen and the plan of care was discussed with the house staff. Please see their note for complete details. I concur with their findings with the following additions/corrections: I suspect over the weekend his diet contributed to difficulty lowering his TG levels. I discussed with him the need to change to a full liquid diet at this time.  He has no abdominal pain. His TG level is 1324.  Continue insulin gtt with D10W aggressively monitoring his BG every 4 hours.  He is willing to stay until his TG level is ~500.  No episodes of hypoglycemia.   Continue Lovaza and Lopid.  Jonah Blue, DO, FACP Faculty [facility] Internal Medicine Residency Program 04/04/2013, 1:42 PM

## 2013-04-04 NOTE — Progress Notes (Signed)
Subjective: No abdominal pain or vomiting, nausea resolved. Tolerating normal diet. Feels much better. Concerned about been discharged and eating his normal diet and subsequent increase in his Triglycerides.   Objective: Vital signs in last 24 hours: Filed Vitals:   04/03/13 0428 04/03/13 1534 04/03/13 2024 04/04/13 0457  BP: 144/89 131/81 137/91 119/70  Pulse: 67 70 81 80  Temp: 98 F (36.7 C) 98.7 F (37.1 C) 98.4 F (36.9 C) 98.2 F (36.8 C)  TempSrc: Oral Oral Oral Oral  Resp: 17 18 17 17   Height:      Weight:      SpO2: 95% 92% 98% 93%   Weight change:   Intake/Output Summary (Last 24 hours) at 04/04/13 1319 Last data filed at 04/04/13 1240  Gross per 24 hour  Intake 3052.5 ml  Output   3975 ml  Net -922.5 ml   Physiacl exam General: Sitting in a chair, NAD  HEENT: PERRL, EOMI, no scleral icterus  Cardiac: RRR, no rubs, murmurs or gallops  Pulm: clear to auscultation bilaterally, no wheezes, rales, or rhonchi  Abd: soft, nontender, nondistended, BS present  Ext: warm and well perfused, no pedal edema  Neuro: alert and oriented X3, cranial nerves II-XII grossly intact, strength and sensation to light touch equal in bilateral upper and lower extremities.  Lab Results: Basic Metabolic Panel:  Recent Labs Lab 04/03/13 1701 04/04/13 0955  NA 136 136  K 4.5 4.0  CL 98 98  CO2 28 26  GLUCOSE 107* 190*  BUN 4* <3*  CREATININE 1.01 0.99  CALCIUM 9.4 9.1   Liver Function Tests:  Recent Labs Lab 03/29/13 1238  AST 20  ALT 18  ALKPHOS 66  BILITOT 0.4  PROT 7.0  ALBUMIN 4.7    Recent Labs Lab 03/29/13 1238  LIPASE 144*   CBC:  Recent Labs Lab 03/31/13 0250  WBC 6.2  HGB 14.1  HCT 42.9  MCV 78.6  PLT 171   CBG:  Recent Labs Lab 04/03/13 1549 04/03/13 2022 04/04/13 0014 04/04/13 0455 04/04/13 0817 04/04/13 1225  GLUCAP 144* 119* 108* 155* 103* 108*   Fasting Lipid Panel:  Recent Labs Lab 03/29/13 1238  04/04/13 0420  CHOL  329*  --   --   HDL 21*  --   --   LDLCALC NOT CALC  --   --   TRIG 2045*  < > 1324*  CHOLHDL 15.7  --   --   < > = values in this interval not displayed.  Urine Drug Screen: Drugs of Abuse     Component Value Date/Time   LABOPIA NEG 12/09/2012 1226   LABOPIA POSITIVE* 12/26/2007 1701   COCAINSCRNUR NEG 12/09/2012 1226   COCAINSCRNUR POSITIVE* 12/26/2007 1701   LABBENZ PPS 12/09/2012 1226   LABBENZ NONE DETECTED 12/26/2007 1701   LABBENZ NEG 03/09/2007 1244   AMPHETMU NONE DETECTED 12/26/2007 1701   AMPHETMU NEG 03/09/2007 1244   THCU NONE DETECTED 12/26/2007 1701   LABBARB NEG 12/09/2012 1226   LABBARB  Value: NONE DETECTED        DRUG SCREEN FOR MEDICAL PURPOSES ONLY.  IF CONFIRMATION IS NEEDED FOR ANY PURPOSE, NOTIFY LAB WITHIN 5 DAYS. 12/26/2007 1701    Urinalysis:  Recent Labs Lab 03/29/13 1238  COLORURINE YELLOW  LABSPEC 1.026  PHURINE 5.5  GLUCOSEU NEG  HGBUR NEG  BILIRUBINUR NEG  KETONESUR NEG  PROTEINUR NEG  UROBILINOGEN 0.2  NITRITE NEG  LEUKOCYTESUR NEG   Micro Results: Recent Results (from  the past 240 hour(s))  MRSA PCR SCREENING     Status: Abnormal   Collection Time    03/30/13  8:50 PM      Result Value Range Status   MRSA by PCR POSITIVE (*) NEGATIVE Final   Comment:            The GeneXpert MRSA Assay (FDA     approved for NASAL specimens     only), is one component of a     comprehensive MRSA colonization     surveillance program. It is not     intended to diagnose MRSA     infection nor to guide or     monitor treatment for     MRSA infections.     RESULT CALLED TO, READ BACK BY AND VERIFIED WITH:     A MOHAMMED RN 2244 03/30/13 A BROWNING   Studies/Results: No results found. Medications: I have reviewed the patient's current medications. Scheduled Meds: . gemfibrozil  600 mg Oral BID AC  . heparin  5,000 Units Subcutaneous Q8H  . losartan  50 mg Oral Daily  . omega-3 acid ethyl esters  1 g Oral BID  . zolpidem  5 mg Oral Once   Continuous  Infusions: . dextrose 10 % 1,000 mL with sodium chloride 154 mEq infusion 50 mL/hr at 04/04/13 0913  . dextrose 10 % 500 mL with insulin regular (NOVOLIN R,HUMULIN R) 16 Units infusion 100 mL/hr at 04/04/13 0913   PRN Meds:.diphenhydrAMINE, ondansetron (ZOFRAN) IV, ondansetron, oxyCODONE, zolpidem  Assessment/Plan: Principal Problem:   Pancreatitis, acute Active Problems:   DEGENERATIVE DISC DISEASE, LUMBAR SPINE, WITH MYELOPATHY   Essential hypertension, benign   Abdominal pain   Hypertriglyceridemia  Pancreatitis- Acute 2/2 to hypertriglyceridemia - Patient reports resolved abdominal pain. Tolerating oral diet. TG on admission- 2263. TG this morning- 1324. Tg increased to 2414 after 2 days on admission on Iv D5/D10 and Insulin, but most likely due to diet that pt was having in the hospital which was high in lipids. Diet was modified, and more aggressive approach to treatment with increased insulin to 16u infusion and changing to D10 to prevent hypoglycemia at 126mls/hr and N/s to prevent hyponatremia. Gemfibrozil and lovaza also in treatment regimen. Blood glucose this morning- 190, Na- 136.  - Cont Insulin and D10.  - Continue Gemfibrozil- 600 BID.  - Continue home lovastatin 40mg  daily.  - Continue Lovaza  - BMP BID  - TG fasting only.  - Considering current levels of TG, diet changed to full liquid diet, to optimize treatment. After counselling patient about the goals of this treatment, Patient said will like to get his TG levels <500, and is ready to stay in the hospital until this goal is achieved. - Percocet 5-325mg  Q4H.  - Fecal fat pending- No episodes of loose stools since admission.  - Counsel pt on dietary modification on discharge.  - Miralax- Complaints of constipation for one dose- likley due to opiods.  DEGENERATIVE DISC DISEASE, LUMBAR SPINE, WITH MYELOPATHY- Patient has chronic back pain, s/p 2 lower spinal surgeries and one cervical surgery. Has pain radiating to his  LLE. Pain currently controlled on oxycodone. PT consulted and request outpatient PT.  - Continue pain control  - Continue PT.  DVT PPx: SQ heparin and SCDS.  Code: Full.    Dispo: Disposition is deferred at this time, awaiting improvement of current medical problems.  Anticipated discharge in approximately 2-3 day(s).   The patient does have a current PCP (  Jonah Blue, DO) and does need an [facility] hospital follow-up appointment after discharge.  The patient does not know have transportation limitations that hinder transportation to clinic appointments.  .Services Needed at time of discharge: Y = Yes, Blank = No PT:   OT:   RN:   Equipment:   Other:     LOS: 5 days   Kennis Carina, MD 04/04/2013, 1:19 PM

## 2013-04-04 NOTE — Progress Notes (Signed)
Physical Therapy Treatment Patient Details Name: Jerome Adams MRN: [id number] DOB: [birth year] Today's Date: 04/04/2013 Time: 4098-1191 PT Time Calculation (min): 25 min  PT Assessment / Plan / Recommendation  History of Present Illness  52 y o male with PMH of degenerative disc dx, lumber spine with myelopathy, HTN, hypertriglyceridemia, chronic diarrhoea, HCV infection. Presented today with c/o of abdominal pain intermittent for the past 6 months, but recent episode for about a week, in his upper abdomen, non radiating, rates the pain as a 4 out of 10. Assoc nausea for weeks but no vomiting. Patient also complains of loose stools for the past 4-6 years, non bloody, up to 9 episodes per day, was given imodium on his last clinic visit- 03/03/2013, and his stool freq has reduced to 2-3 episodes a day. Patient endorses alcoholic drinks on the weekends, occasionally during the week- about 2 bottles of beer. Body weight has been stable, no fever or chills, colonoscopy done 4/13 showed 2 polys- 1 tubulous adenoma, no diverticula, for follow up in 5 years. Patient presented yesterday- 9/2/2104for follow up visit, and had CMp, CBc with diff, urinalysis, lipid panel, FOBT, Stool culture, stool o&p, fecal fat study and lipase. Lipase- 144, TG- 2045, other results pending. He was told to come back here today, becuse of his markedly elevated triglycerides.   PT Comments   Patient improving with ambulation - more steady.  Continued with back exercises.  Follow Up Recommendations  Outpatient PT (for back pain)     Does the patient have the potential to tolerate intense rehabilitation     Barriers to Discharge        Equipment Recommendations  None recommended by PT    Recommendations for Other Services    Frequency Min 3X/week   Progress towards PT Goals Progress towards PT goals: Progressing toward goals  Plan Current plan remains appropriate    Precautions / Restrictions Precautions Precautions:  None Restrictions Weight Bearing Restrictions: No   Pertinent Vitals/Pain Continues to have low back pain with spasms.    Mobility  Bed Mobility Bed Mobility: Rolling Right;Right Sidelying to Sit;Sit to Supine Rolling Right: 7: Independent Right Sidelying to Sit: 7: Independent;HOB flat Sit to Supine: 7: Independent Details for Bed Mobility Assistance: Patient uses good technique for bed mobility with his back pain. Transfers Transfers: Sit to Stand;Stand to Sit Sit to Stand: 7: Independent;From bed Stand to Sit: 7: Independent;To bed Details for Transfer Assistance: Good balance with transfers Ambulation/Gait Ambulation/Gait Assistance: 6: Modified independent (Device/Increase time) Ambulation Distance (Feet): 300 Feet Assistive device: None;Other (Comment) (Holding IV pole) Ambulation/Gait Assistance Details: Good gait pattern and balance.  At end of ambulation, patient reports back with increased spasms, and patient "depending on" IV pole more for stability. Gait Pattern: Step-through pattern;Antalgic;Decreased trunk rotation    Exercises Total Joint Exercises Bridges: AROM;Both;10 reps;Supine Other Exercises Other Exercises: Pt performed core stabilization exercises holding for 5 seconds x 10 with  VCs to prevent holding breath     PT Goals (current goals can now be found in the care plan section)    Visit Information  Last PT Received On: 04/04/13 Assistance Needed: +1 History of Present Illness:  51 y o male with PMH of degenerative disc dx, lumber spine with myelopathy, HTN, hypertriglyceridemia, chronic diarrhoea, HCV infection. Presented today with c/o of abdominal pain intermittent for the past 6 months, but recent episode for about a week, in his upper abdomen, non radiating, rates the pain as a 4 out of  10. Assoc nausea for weeks but no vomiting. Patient also complains of loose stools for the past 4-6 years, non bloody, up to 9 episodes per day, was given imodium on  his last clinic visit- 03/03/2013, and his stool freq has reduced to 2-3 episodes a day. Patient endorses alcoholic drinks on the weekends, occasionally during the week- about 2 bottles of beer. Body weight has been stable, no fever or chills, colonoscopy done 4/13 showed 2 polys- 1 tubulous adenoma, no diverticula, for follow up in 5 years. Patient presented yesterday- 9/2/2104for follow up visit, and had CMp, CBc with diff, urinalysis, lipid panel, FOBT, Stool culture, stool o&p, fecal fat study and lipase. Lipase- 144, TG- 2045, other results pending. He was told to come back here today, becuse of his markedly elevated triglycerides.    Subjective Data  Subjective: My back is still having spasms   Cognition  Cognition Arousal/Alertness: Awake/alert Behavior During Therapy: WFL for tasks assessed/performed Overall Cognitive Status: Within Functional Limits for tasks assessed    Balance     End of Session PT - End of Session Activity Tolerance: Patient tolerated treatment well Patient left: in bed;with call bell/phone within reach;with nursing/sitter in room (sitting at EOB) Nurse Communication: Mobility status   GP     Vena Austria 04/04/2013, 1:13 PM Jerome Adams, [facility] Acute Rehab Services Pager [phone]

## 2013-04-05 LAB — TRIGLYCERIDES: Triglycerides: 758 mg/dL — ABNORMAL HIGH (ref <150)

## 2013-04-05 LAB — BASIC METABOLIC PANEL
BUN: <3 mg/dL — ABNORMAL LOW (ref 6–23)
BUN: <3 mg/dL — ABNORMAL LOW (ref 6–23)
CO2: 28 mEq/L (ref 19–32)
CO2: 28 mEq/L (ref 19–32)
Calcium: 9.4 mg/dL (ref 8.4–10.5)
Calcium: 9.8 mg/dL (ref 8.4–10.5)
Chloride: 97 mEq/L (ref 96–112)
Chloride: 98 mEq/L (ref 96–112)
Creatinine, Ser: 1 mg/dL (ref 0.50–1.35)
Creatinine, Ser: 0.96 mg/dL (ref 0.50–1.35)
GFR calc Af Amer: >90 mL/min (ref >90)
GFR calc Af Amer: >90 mL/min (ref >90)
GFR calc non Af Amer: 85 mL/min — ABNORMAL LOW (ref >90)
GFR calc non Af Amer: >90 mL/min (ref >90)
Glucose, Bld: 87 mg/dL (ref 70–99)
Glucose, Bld: 103 mg/dL — ABNORMAL HIGH (ref 70–99)
Potassium: 3.4 mEq/L — ABNORMAL LOW (ref 3.5–5.1)
Potassium: 4.1 mEq/L (ref 3.5–5.1)
Sodium: 136 mEq/L (ref 135–145)
Sodium: 137 mEq/L (ref 135–145)

## 2013-04-05 LAB — GLUCOSE, CAPILLARY
Glucose-Capillary: 113 mg/dL — ABNORMAL HIGH (ref 70–99)
Glucose-Capillary: 136 mg/dL — ABNORMAL HIGH (ref 70–99)
Glucose-Capillary: 148 mg/dL — ABNORMAL HIGH (ref 70–99)
Glucose-Capillary: 170 mg/dL — ABNORMAL HIGH (ref 70–99)
Glucose-Capillary: 97 mg/dL (ref 70–99)

## 2013-04-05 LAB — TSH: TSH: 1.477 u[IU]/mL (ref 0.350–4.500)

## 2013-04-05 NOTE — Progress Notes (Signed)
  Date: 04/05/2013  Patient name: Jerome Adams  Medical record number: [id number]  Date of birth: 04/04/1961   This patient has been seen and the plan of care was discussed with the house staff. Please see their note for complete details. I concur with their findings with the following additions/corrections: No abdominal pain. No N/V/D. TG level down to 758. Continue current insulin gtt, lovaza, and gemfibrozil. Continue same diet, don't advance yet. Expect D/C tomorrow if TG level less than 500, which is very likely.  Jonah Blue, DO, FACP Faculty [facility] Internal Medicine Residency Program 04/05/2013, 11:40 AM

## 2013-04-05 NOTE — Progress Notes (Signed)
Subjective: No abdominal pain or vomiting, nausea resolved. Diet changed to full liquid diet yesterday. Patient wants to some solid food added to his diet, But he was counselled to wait.  Objective: Vital signs in last 24 hours: Filed Vitals:   04/04/13 0457 04/04/13 1559 04/04/13 2142 04/05/13 0520  BP: 119/70  133/77 128/79  Pulse: 80 73 90 80  Temp: 98.2 F (36.8 C) 98.8 F (37.1 C) 99 F (37.2 C) 98.4 F (36.9 C)  TempSrc: Oral Oral Oral Oral  Resp: 17 18 18 18   Height:      Weight:      SpO2: 93% 97% 97% 98%   Weight change:   Intake/Output Summary (Last 24 hours) at 04/05/13 1231 Last data filed at 04/05/13 0934  Gross per 24 hour  Intake   1605 ml  Output   4900 ml  Net  -3295 ml   Physiacl exam General: Sitting in a chair, NAD  HEENT: PERRL, EOMI, no scleral icterus  Cardiac: RRR, no rubs, murmurs or gallops  Pulm: clear to auscultation bilaterally, no wheezes, rales, or rhonchi  Abd: soft, nontender, nondistended, BS present  Ext: warm and well perfused, no pedal edema  Neuro: alert and oriented X3, cranial nerves II-XII grossly intact, strength and sensation to light touch equal in bilateral upper and lower extremities.  Lab Results: Basic Metabolic Panel:  Recent Labs Lab 04/04/13 1934 04/05/13 0850  NA 136 136  K 4.2 3.4*  CL 98 97  CO2 27 28  GLUCOSE 124* 87  BUN <3* <3*  CREATININE 0.95 1.00  CALCIUM 9.4 9.4   Liver Function Tests:  Recent Labs Lab 03/29/13 1238  AST 20  ALT 18  ALKPHOS 66  BILITOT 0.4  PROT 7.0  ALBUMIN 4.7    Recent Labs Lab 03/29/13 1238  LIPASE 144*   CBC:  Recent Labs Lab 03/31/13 0250  WBC 6.2  HGB 14.1  HCT 42.9  MCV 78.6  PLT 171   CBG:  Recent Labs Lab 04/04/13 1716 04/04/13 1956 04/05/13 0012 04/05/13 0442 04/05/13 0729 04/05/13 1218  GLUCAP 92 130* 170* 136* 113* 97   Fasting Lipid Panel:  Recent Labs Lab 03/29/13 1238  04/05/13 0850  CHOL 329*  --   --   HDL 21*  --    --   LDLCALC NOT CALC  --   --   TRIG 2045*  < > 758*  CHOLHDL 15.7  --   --   < > = values in this interval not displayed.  Urine Drug Screen: Drugs of Abuse     Component Value Date/Time   LABOPIA NEG 12/09/2012 1226   LABOPIA POSITIVE* 12/26/2007 1701   COCAINSCRNUR NEG 12/09/2012 1226   COCAINSCRNUR POSITIVE* 12/26/2007 1701   LABBENZ PPS 12/09/2012 1226   LABBENZ NONE DETECTED 12/26/2007 1701   LABBENZ NEG 03/09/2007 1244   AMPHETMU NONE DETECTED 12/26/2007 1701   AMPHETMU NEG 03/09/2007 1244   THCU NONE DETECTED 12/26/2007 1701   LABBARB NEG 12/09/2012 1226   LABBARB  Value: NONE DETECTED        DRUG SCREEN FOR MEDICAL PURPOSES ONLY.  IF CONFIRMATION IS NEEDED FOR ANY PURPOSE, NOTIFY LAB WITHIN 5 DAYS. 12/26/2007 1701    Urinalysis:  Recent Labs Lab 03/29/13 1238  COLORURINE YELLOW  LABSPEC 1.026  PHURINE 5.5  GLUCOSEU NEG  HGBUR NEG  BILIRUBINUR NEG  KETONESUR NEG  PROTEINUR NEG  UROBILINOGEN 0.2  NITRITE NEG  LEUKOCYTESUR NEG   Micro  Results: Recent Results (from the past 240 hour(s))  MRSA PCR SCREENING     Status: Abnormal   Collection Time    03/30/13  8:50 PM      Result Value Range Status   MRSA by PCR POSITIVE (*) NEGATIVE Final   Comment:            The GeneXpert MRSA Assay (FDA     approved for NASAL specimens     only), is one component of a     comprehensive MRSA colonization     surveillance program. It is not     intended to diagnose MRSA     infection nor to guide or     monitor treatment for     MRSA infections.     RESULT CALLED TO, READ BACK BY AND VERIFIED WITH:     A MOHAMMED RN 2244 03/30/13 A BROWNING   Studies/Results: No results found. Medications: I have reviewed the patient's current medications. Scheduled Meds: . gemfibrozil  600 mg Oral BID AC  . heparin  5,000 Units Subcutaneous Q8H  . losartan  50 mg Oral Daily  . omega-3 acid ethyl esters  1 g Oral BID  . zolpidem  5 mg Oral Once   Continuous Infusions: . dextrose 10 %  1,000 mL with sodium chloride 154 mEq infusion 50 mL/hr at 04/05/13 0850  . dextrose 10 % 500 mL with insulin regular (NOVOLIN R,HUMULIN R) 16 Units infusion 100 mL/hr at 04/05/13 0926   PRN Meds:.diphenhydrAMINE, ondansetron (ZOFRAN) IV, ondansetron, oxyCODONE, zolpidem  Assessment/Plan: Principal Problem:   Pancreatitis, acute Active Problems:   DEGENERATIVE DISC DISEASE, LUMBAR SPINE, WITH MYELOPATHY   Essential hypertension, benign   Abdominal pain   Hypertriglyceridemia  Pancreatitis- Acute 2/2 to hypertriglyceridemia - Patient reports resolved abdominal pain. Tolerating oral diet. TG on admission- 2263. TG this morning- 758. Diet was modified approach- full liquid diet, appears very effective, currently on insulin16u infusion and D10 to prevent hypoglycemia at 170mls/hr and N/s to prevent hyponatremia. Gemfibrozil and lovaza also in treatment regimen. Blood glucose this morning- 113, Na- 136.  - Cont Insulin and D10.  - Continue Gemfibrozil- 600 BID.  - Continue home lovastatin 40mg  daily.  - Continue Lovaza  - BMP BID  - Cont TG fasting only.  - Considering current levels of TG, cont full liquid diet, to optimize treatment. Patient said will like to get his TG levels <500, and is ready to stay in the hospital until this goal is achieved. - Percocet 5-325mg  Q4H.  - Fecal fat pending- No episodes of loose stools since admission.  - Counsel pt on dietary modification on discharge.  - Miralax- Complaints of constipation for one dose- likley due to opiods.  DEGENERATIVE DISC DISEASE, LUMBAR SPINE, WITH MYELOPATHY- Patient has chronic back pain, s/p 2 lower spinal surgeries and one cervical surgery. Has pain radiating to his LLE. Pain currently controlled on oxycodone. PT consulted and request outpatient PT.  - Continue pain control  - Continue PT.  DVT PPx: SQ heparin and SCDS.  Code: Full.    Dispo: Disposition is deferred at this time, awaiting improvement of current medical  problems.  Anticipated discharge in approximately 2-3 day(s).   The patient does have a current PCP Jonah Blue, DO) and does need an [facility] hospital follow-up appointment after discharge.  The patient does not know have transportation limitations that hinder transportation to clinic appointments.  .Services Needed at time of discharge: Y = Yes, Blank = No  PT:   OT:   RN:   Equipment:   Other:     LOS: 6 days   Kennis Carina, MD 04/05/2013, 12:31 PM

## 2013-04-06 DIAGNOSIS — I10 Essential (primary) hypertension: Secondary | ICD-10-CM

## 2013-04-06 LAB — GLUCOSE, CAPILLARY
Glucose-Capillary: 132 mg/dL — ABNORMAL HIGH (ref 70–99)
Glucose-Capillary: 159 mg/dL — ABNORMAL HIGH (ref 70–99)
Glucose-Capillary: 163 mg/dL — ABNORMAL HIGH (ref 70–99)
Glucose-Capillary: 68 mg/dL — ABNORMAL LOW (ref 70–99)
Glucose-Capillary: 74 mg/dL (ref 70–99)

## 2013-04-06 LAB — BASIC METABOLIC PANEL
BUN: <3 mg/dL — ABNORMAL LOW (ref 6–23)
CO2: 26 mEq/L (ref 19–32)
Calcium: 9.4 mg/dL (ref 8.4–10.5)
Chloride: 97 mEq/L (ref 96–112)
Creatinine, Ser: 0.91 mg/dL (ref 0.50–1.35)
GFR calc Af Amer: >90 mL/min (ref >90)
GFR calc non Af Amer: >90 mL/min (ref >90)
Glucose, Bld: 72 mg/dL (ref 70–99)
Potassium: 3.9 mEq/L (ref 3.5–5.1)
Sodium: 133 mEq/L — ABNORMAL LOW (ref 135–145)

## 2013-04-06 LAB — TRIGLYCERIDES: Triglycerides: 935 mg/dL — ABNORMAL HIGH (ref <150)

## 2013-04-06 MED ORDER — OMEGA-3-ACID ETHYL ESTERS 1 G PO CAPS: 1.0000 g | ORAL_CAPSULE | Freq: Two times a day (BID) | ORAL | Status: DC

## 2013-04-06 MED ORDER — DEXTROSE 50 % IV SOLN
1.0000 | Freq: Once | INTRAVENOUS | Status: AC
  Administered 2013-04-06: 50 mL via INTRAVENOUS

## 2013-04-06 MED ORDER — DEXTROSE 50 % IV SOLN
INTRAVENOUS | Status: AC
  Filled 2013-04-06: qty 50

## 2013-04-06 MED ORDER — GEMFIBROZIL 600 MG PO TABS: 600.0000 mg | ORAL_TABLET | Freq: Two times a day (BID) | ORAL | Status: DC

## 2013-04-06 NOTE — Progress Notes (Addendum)
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 Pt stated that he desperately needed the money. I will discuss with Dr. Kem Kays and his team the situation. The patient is wanting to sign out AMA if he is not discharged by 2pm.

## 2013-04-06 NOTE — Progress Notes (Signed)
Hypoglycemic Event  CBG: 68  Treatment: D50 IV 50 mL  Symptoms: Sweaty and Shaky  Follow-up CBG: Time:0430 CBG Result:159  Possible Reasons for Event:   Comments/MD notified:Yes    Jerome Adams E  Remember to initiate Hypoglycemia Order Set & complete

## 2013-04-06 NOTE — Discharge Summary (Signed)
Name: Jerome Adams MRN: [id number] DOB: 04-08-1961 52 y.o. PCP: Jerome Blue, DO  Date of Admission: 03/30/2013  5:04 PM Date of Discharge: 04/06/2013 Attending Physician: Jerome Blue, DO  Discharge Diagnosis:  Principal Problem:   Pancreatitis, acute Active Problems:   DEGENERATIVE DISC DISEASE, LUMBAR SPINE, WITH MYELOPATHY   Essential hypertension, benign   Abdominal pain   Hypertriglyceridemia  Discharge Medications:   Medication List         diazepam 5 MG tablet  Commonly known as:  VALIUM  Take 5 mg by mouth every 12 (twelve) hours as needed for anxiety.     gemfibrozil 600 MG tablet  Commonly known as:  LOPID  Take 1 tablet (600 mg total) by mouth 2 (two) times daily before a meal.     hydrochlorothiazide 25 MG tablet  Commonly known as:  HYDRODIURIL  Take 25 mg by mouth daily.     losartan 50 MG tablet  Commonly known as:  COZAAR  Take 50 mg by mouth daily.     lovastatin 40 MG tablet  Commonly known as:  MEVACOR  Take 40 mg by mouth daily.     multivitamin with minerals Tabs tablet  Take 1 tablet by mouth daily.     omega-3 acid ethyl esters 1 G capsule  Commonly known as:  LOVAZA  Take 1 capsule (1 g total) by mouth 2 (two) times daily.     oxyCODONE 15 MG immediate release tablet  Commonly known as:  ROXICODONE  Take 15 mg by mouth every 4 (four) hours as needed for pain.     pantoprazole 40 MG tablet  Commonly known as:  PROTONIX  Take 1 tablet (40 mg total) by mouth daily.     sertraline 50 MG tablet  Commonly known as:  ZOLOFT  Take 1 tablet (50 mg total) by mouth daily.     sucralfate 1 GM/10ML suspension  Commonly known as:  CARAFATE  Take 10 mLs (1 g total) by mouth 4 (four) times daily.        Disposition and follow-up:   Jerome Adams was discharged from [facility] in Good condition.  At the hospital follow up visit please address:  1.  Follow up on patients Triglyceride levels. Pt should  comply with medications and avoid meals that would cause elevated cholesterol.  2. Follow up with Ms. Jerome Adams- Nutrition consult.  Follow-up Appointments:     Follow-up Information   Follow up with Jerome Rung, DO On 04/11/2013. ( At 8.15am)    Specialty:  Internal Medicine   Contact information:   [address] [place] [address] [phone]       Discharge Instructions: Discharge Orders   Future Appointments Provider Department Dept Phone   04/11/2013 8:15 AM Jerome Purl, DO [facility] [phone]   04/11/2013 9:30 AM Jerome Adams, RD [facility] [phone]   Future Orders Complete By Expires   Call MD for:  persistant nausea and vomiting  As directed    Call MD for:  severe uncontrolled pain  As directed    Diet - low sodium heart healthy  As directed    Diet Carb Modified  As directed    Discharge instructions  As directed    Comments:     We will be discharging you home on 2 blood pressure medications- Lovaza and gemfibrozil, these drugs will help reduce your triglyceride levels. But  a major part of keeping your triglyceride down will depend on your diet- Please avoid fatty foods, fried foods, greasy foods. We will be setting up an appointment for you to see our nutritionist in our clinic.   Increase activity slowly  As directed      Admission ZOX:[id number] Complaint: Abdominal pain  History of Present Illness: 25 y o male with PMH of degenerative disc dx, lumber spine with myelopathy, HTN, hypertriglyceridemia, chronic diarrhoea, HCV infection. Presented today with c/o of abdominal pain intermittent for the past 6 months, but recent episode for about a week, in his upper abdomen, non radiating, rates the pain as a 4 out of 10. Assoc nausea for weeks but no vomiting. Patient also complains of loose stools for the past 4-6 years, non bloody, up to 9 episodes per day, was given imodium on his last clinic visit-  03/03/2013, and his stool freq has reduced to 2-3 episodes a day. Patient endorses alcoholic drinks on the weekends, occasionally during the week- about 2 bottles of beer. Body weight has been stable, no fever or chills, colonoscopy done 4/13 showed 2 polys- 1 tubulous adenoma, no diverticula, for follow up in 5 years. Patient presented yesterday- 9/2/2104for follow up visit, and had CMp, CBc with diff, urinalysis, lipid panel, FOBT, Stool culture, stool o&p, fecal fat study and lipase. Lipase- 144, TG- 2045, other results pending. He was told to come back here today, becuse of his markedly elevated triglycerides.   Physical Exam:  Blood pressure 139/79, pulse 66, temperature 98.2 F (36.8 C), temperature source Oral, resp. rate 20, height 5\' 9"  (1.753 m), weight 202 lb (91.627 kg), SpO2 94.00%.  .  GENERAL- alert, co-operative, appears as stated age, not in any distress.  HEENT- Atraumatic, normocephalic, PERRL, EOMI, oral mucosa appears moist, good and intact dentition.  CARDIAC- RRR, no murmurs, rubs or gallops.  RESP- Moving equal volumes of air, and clear to auscultation bilaterally.  ABDOMEN- Soft, tenderness in epigastric region, mildly in LLQ, no guarding, or rebound, no palpable masses or organomegaly, bowel sounds present.  BACK- Normal curvature of the spine, No tenderness along the vertebrae, no CVA tenderness.  EXTREMITIES- pulse 2+, symmetric.  SKIN- Warm, dry, No rash or lesion.  PSYCH- Normal mood and affect, appropriate thought content and speech.   Hospital Course by problem list:   Acute Pancreatitis due to hypertriglyceridemia - Triglyceride level on admission-2263,  TG- >2000, considered very severe hyperTG, placing pt at highest risk for pancreatitis, also lipase mildly elevated at 144. - NPO. Pt was placed on NPO, IV dilaudid was given for pain, odansetron for nausea, and started on IVF D5 +insulin, CBG and BMP monitored closely,  to maintain blood glucose- between 150-200, D5  was changed to D10 and N/s as patient Sodium was border line low, and TG levels were followed daily. Gemfibrozil and Lovaza were added to treatment regimen. Oral intake was introduced, when abdominal pains stopped. On discharge, TG levels- 935, with no complaints of abdominal pain, and tolerating orally. To follow up closely as an out-pt, to ensure medication compliance and nutrition consult-for dietary control.    Chronic Diarrhoea- Likely due chronic malabsorpion due to pancreatic insufficiency. Stool elastase, could not be done to determine this, as  Pt had no episodes of loose stool while on admission, so this could not be determined. Creon addition to his diet would help, if this is definetly diagnosed. Considerations were also for IBS predom diarrhea, Inflammatory bowel dx, but-colonoscopy done 4/13 showed 2  polys-1 tubulous adenoma, no diverticula, for follow up in 5 years, ruling out ulcerative colitis but not Crohn's dx. Stool for Ova and parasite also could not be done, though chronicity of diarrhoea made this unlikely.   Degenerative Disc dx, lumber spine with myelopathy- Patient has chronic back pain, has a surgeon who he follow with, has had 2 back surgeries and one neck surgery. Has radiating pain to his Left lower extremity. Pain currently controlled on oxycodone. Patient says he will like to contact his surgeon to set up an appointment.   HTN- Blood pressure mostly controlled while on admission. Patient was discharged home on HCT 25mg  daily and lorsatan- 50mg  daily.   Discharge Vitals:   BP 149/86  Pulse 72  Temp(Src) 98.2 F (36.8 C) (Oral)  Resp 18  Ht 5\' 9"  (1.753 m)  Wt 202 lb (91.627 kg)  BMI 29.82 kg/m2  SpO2 92%  Discharge Labs:  Results for orders placed during the hospital encounter of 03/30/13 (from the past 24 hour(s))  GLUCOSE, CAPILLARY     Status: Abnormal   Collection Time    04/05/13  4:34 PM      Result Value Range   Glucose-Capillary 148 (*) 70 - 99 mg/dL    BASIC METABOLIC PANEL     Status: Abnormal   Collection Time    04/05/13  5:24 PM      Result Value Range   Sodium 137  135 - 145 mEq/L   Potassium 4.1  3.5 - 5.1 mEq/L   Chloride 98  96 - 112 mEq/L   CO2 28  19 - 32 mEq/L   Glucose, Bld 103 (*) 70 - 99 mg/dL   BUN <3 (*) 6 - 23 mg/dL   Creatinine, Ser 4.78  0.50 - 1.35 mg/dL   Calcium 9.8  8.4 - 29.5 mg/dL   GFR calc non Af Amer >90  >90 mL/min   GFR calc Af Amer >90  >90 mL/min  GLUCOSE, CAPILLARY     Status: Abnormal   Collection Time    04/06/13 12:17 AM      Result Value Range   Glucose-Capillary 163 (*) 70 - 99 mg/dL   Comment 1 Documented in Chart     Comment 2 Notify RN    GLUCOSE, CAPILLARY     Status: Abnormal   Collection Time    04/06/13  3:58 AM      Result Value Range   Glucose-Capillary 68 (*) 70 - 99 mg/dL  GLUCOSE, CAPILLARY     Status: Abnormal   Collection Time    04/06/13  4:41 AM      Result Value Range   Glucose-Capillary 159 (*) 70 - 99 mg/dL  GLUCOSE, CAPILLARY     Status: None   Collection Time    04/06/13  7:56 AM      Result Value Range   Glucose-Capillary 74  70 - 99 mg/dL   Comment 1 Documented in Chart     Comment 2 Notify RN    BASIC METABOLIC PANEL     Status: Abnormal   Collection Time    04/06/13  8:03 AM      Result Value Range   Sodium 133 (*) 135 - 145 mEq/L   Potassium 3.9  3.5 - 5.1 mEq/L   Chloride 97  96 - 112 mEq/L   CO2 26  19 - 32 mEq/L   Glucose, Bld 72  70 - 99 mg/dL   BUN <3 (*) 6 -  23 mg/dL   Creatinine, Ser 6.04  0.50 - 1.35 mg/dL   Calcium 9.4  8.4 - 54.0 mg/dL   GFR calc non Af Amer >90  >90 mL/min   GFR calc Af Amer >90  >90 mL/min  TRIGLYCERIDES     Status: Abnormal   Collection Time    04/06/13  8:03 AM      Result Value Range   Triglycerides 935 (*) <150 mg/dL  GLUCOSE, CAPILLARY     Status: Abnormal   Collection Time    04/06/13 12:14 PM      Result Value Range   Glucose-Capillary 132 (*) 70 - 99 mg/dL   Comment 1 Documented in Chart     Comment 2  Notify RN      Signed: Kennis Carina, MD 04/06/2013, 2:02 PM   Time Spent on Discharge: 35 minutes.

## 2013-04-06 NOTE — Progress Notes (Signed)
Physical Therapy Treatment and D/C from Acute PT Patient Details Name: Jerome Adams MRN: [id number] DOB: Nov 02, 1960 Today's Date: 04/06/2013 Time: 4098-1191 PT Time Calculation (min): 29 min  PT Assessment / Plan / Recommendation  History of Present Illness  52 y o male with PMH of degenerative disc dx, lumber spine with myelopathy, HTN, hypertriglyceridemia, chronic diarrhoea, HCV infection. Presented today with c/o of abdominal pain intermittent for the past 6 months, but recent episode for about a week, in his upper abdomen, non radiating, rates the pain as a 4 out of 10. Assoc nausea for weeks but no vomiting. Patient also complains of loose stools for the past 4-6 years, non bloody, up to 9 episodes per day, was given imodium on his last clinic visit- 03/03/2013, and his stool freq has reduced to 2-3 episodes a day. Patient endorses alcoholic drinks on the weekends, occasionally during the week- about 2 bottles of beer. Body weight has been stable, no fever or chills, colonoscopy done 4/13 showed 2 polys- 1 tubulous adenoma, no diverticula, for follow up in 5 years. Patient presented yesterday- 9/2/2104for follow up visit, and had CMp, CBc with diff, urinalysis, lipid panel, FOBT, Stool culture, stool o&p, fecal fat study and lipase. Lipase- 144, TG- 2045, other results pending. He was told to come back here today, becuse of his markedly elevated triglycerides.   PT Comments   Pt demonstrating progress as he ambulated 400' at MOD I level today.  Pt also able to perform core and LE exercises and states he would like to continue PT when discharged. Pt would benefit from outpt PT in order to improve balance during amb., and to increase core, back, and LE strength. PT discharging pt from acute care PT as he has met his acute care goals. Pt agreeable to acute care PT d/c. PT provided pt with handout of exercises.  Follow Up Recommendations  Outpatient PT     Does the patient have the potential to  tolerate intense rehabilitation     Barriers to Discharge        Equipment Recommendations  None recommended by PT    Recommendations for Other Services    Frequency     Progress towards PT Goals Progress towards PT goals: Goals met/education completed, patient discharged from PT  Plan Other (comment) (Pt met maximal functional gains for acute PT.)    Precautions / Restrictions Precautions Precautions: Fall Restrictions Weight Bearing Restrictions: No   Pertinent Vitals/Pain Pt reports low back pain is 6/10 during amb, however, pt states it is tolerable and he was repositioned upon return to room.    Mobility  Bed Mobility Bed Mobility: Supine to Sit;Sit to Supine Supine to Sit: 7: Independent Sit to Supine: 7: Independent Details for Bed Mobility Assistance: Pt ind. performed supine<>sit transfers.  Transfers Transfers: Sit to Stand;Stand to Sit Sit to Stand: 7: Independent;From chair/3-in-1 Stand to Sit: 7: Independent;To bed Details for Transfer Assistance: Pt able to complete sit<>stand transfers safely. Ambulation/Gait Ambulation/Gait Assistance: 6: Modified independent (Device/Increase time) Ambulation Distance (Feet): 400 Feet Assistive device: None;Other (Comment) (Amb. with and without holding onto IV pole.) Ambulation/Gait Assistance Details: Pt amb. with wide BOS and reports episodes of LOB but pt is able to self correct to maintain balance, while ambulating with and without holding onto IV pole. Gait Pattern: Step-through pattern;Antalgic;Decreased trunk rotation;Wide base of support Gait velocity: decreased    Exercises General Exercises - Lower Extremity Hip Flexion/Marching: AROM;Supine;Both Other Exercises Other Exercises: Pt performed bil. lower  trunk rotation stretches in supine, 3 reps with 20 sec hold, with VCs to maintain stretch for full 20 sec and to prevent holding breath.  Pt performed this with prior PT, so reviewed. Other Exercises: Pt performed  bridges in supine with 2 sec hold, while contracting abdominal muscles to support low back x10 reps. VC's to stay in pain free range and maintain abdominal contraction. Other Exercises: Pt performed bil. UE reaching in quadraped on bed (x5/UE), while contracting ab muscles. VC's to keep hip/knees and shoulder/wrist aligned for proper technique. Other Exercises: Pt performed low back stretch (child's pose) on bed, 3 reps/20 sec hold. VCs for proper technique.   PT Diagnosis:    PT Problem List:   PT Treatment Interventions:     PT Goals (current goals can now be found in the care plan section) Acute Rehab PT Goals Patient Stated Goal: I would like outpt PT to improve my balance and low back pain. PT Goal Formulation: With patient Potential to Achieve Goals: Good  Visit Information  Last PT Received On: 04/06/13 Assistance Needed: +1 History of Present Illness:  24 y o male with PMH of degenerative disc dx, lumber spine with myelopathy, HTN, hypertriglyceridemia, chronic diarrhoea, HCV infection. Presented today with c/o of abdominal pain intermittent for the past 6 months, but recent episode for about a week, in his upper abdomen, non radiating, rates the pain as a 4 out of 10. Assoc nausea for weeks but no vomiting. Patient also complains of loose stools for the past 4-6 years, non bloody, up to 9 episodes per day, was given imodium on his last clinic visit- 03/03/2013, and his stool freq has reduced to 2-3 episodes a day. Patient endorses alcoholic drinks on the weekends, occasionally during the week- about 2 bottles of beer. Body weight has been stable, no fever or chills, colonoscopy done 4/13 showed 2 polys- 1 tubulous adenoma, no diverticula, for follow up in 5 years. Patient presented yesterday- 9/2/2104for follow up visit, and had CMp, CBc with diff, urinalysis, lipid panel, FOBT, Stool culture, stool o&p, fecal fat study and lipase. Lipase- 144, TG- 2045, other results pending. He was told to  come back here today, becuse of his markedly elevated triglycerides.    Subjective Data  Patient Stated Goal: I would like outpt PT to improve my balance and low back pain.   Cognition  Cognition Arousal/Alertness: Awake/alert Behavior During Therapy: WFL for tasks assessed/performed Overall Cognitive Status: Within Functional Limits for tasks assessed    Balance     End of Session PT - End of Session Activity Tolerance: Patient tolerated treatment well Patient left: in bed;with call bell/phone within reach;with nursing/sitter in room   GP     Sol Blazing 04/06/2013, 11:11 AM

## 2013-04-06 NOTE — Progress Notes (Signed)
Jerome Adams to be D/C'd Home per MD order.  Discussed with the patient and all questions fully answered.    Medication List         diazepam 5 MG tablet  Commonly known as:  VALIUM  Take 5 mg by mouth every 12 (twelve) hours as needed for anxiety.     gemfibrozil 600 MG tablet  Commonly known as:  LOPID  Take 1 tablet (600 mg total) by mouth 2 (two) times daily before a meal.     hydrochlorothiazide 25 MG tablet  Commonly known as:  HYDRODIURIL  Take 25 mg by mouth daily.     losartan 50 MG tablet  Commonly known as:  COZAAR  Take 50 mg by mouth daily.     lovastatin 40 MG tablet  Commonly known as:  MEVACOR  Take 40 mg by mouth daily.     multivitamin with minerals Tabs tablet  Take 1 tablet by mouth daily.     omega-3 acid ethyl esters 1 G capsule  Commonly known as:  LOVAZA  Take 1 capsule (1 g total) by mouth 2 (two) times daily.     oxyCODONE 15 MG immediate release tablet  Commonly known as:  ROXICODONE  Take 15 mg by mouth every 4 (four) hours as needed for pain.     pantoprazole 40 MG tablet  Commonly known as:  PROTONIX  Take 1 tablet (40 mg total) by mouth daily.     sertraline 50 MG tablet  Commonly known as:  ZOLOFT  Take 1 tablet (50 mg total) by mouth daily.     sucralfate 1 GM/10ML suspension  Commonly known as:  CARAFATE  Take 10 mLs (1 g total) by mouth 4 (four) times daily.        VVS, Skin clean, dry and intact without evidence of skin break down, no evidence of skin tears noted. IV catheter discontinued intact. Site without signs and symptoms of complications. Dressing and pressure applied.  An After Visit Summary was printed and given to the patient. Patient escorted via WC, and D/C home via private auto.  [name] 04/06/2013 1:06 PM

## 2013-04-06 NOTE — Progress Notes (Signed)
  Date: 04/06/2013  Patient name: Jerome Adams  Medical record number: [id number]  Date of birth: 1960-12-22   This patient has been seen and the plan of care was discussed with the house staff. Please see their note for complete details. I concur with their findings with the following additions/corrections: Feels well today. No abdominal tenderness noted and no distention. No N/V/D/C. He admits to eating a non liquid diet last night. His TG increased to 935 from 758. He has no abdominal pain and is tolerating food. I do not see a need to continue treating him with IV insulin in the hospital at this time. He will need diet modification at home including abstaining from alcohol use, he was informed. He will seen our nutrition educator in clinic.  Continue gemfibrozil and lovaza as outpatient. Follow up in clinic next week. I gave him diet advice.  Medically stable for D/C home.  Jonah Blue, DO, FACP Faculty [facility] Internal Medicine Residency Program 04/06/2013, 3:50 PM

## 2013-04-06 NOTE — Progress Notes (Signed)
Dietary called RN to verify that patient is on a Full liquid kosher diet. Dietary explained that the patient can only have coffee, tea, water, or ice. On call MD clarified with RN that this was the correct diet.

## 2013-04-06 NOTE — Progress Notes (Signed)
I have reviewed this note and agree with all findings. Kati Ariya Bohannon, PT, DPT Pager: [phone]   

## 2013-04-06 NOTE — Progress Notes (Signed)
Subjective: No complaints today. Want clarification about what kind of diet he can eat. Pt has been on a full liquid diet, tolerating oral intake, No diarrhea or vomiting or abdominal pain.   Objective: Vital signs in last 24 hours: Filed Vitals:   04/05/13 0520 04/05/13 1429 04/05/13 2215 04/06/13 0359  BP: 128/79 136/80 128/77 149/86  Pulse: 80 79 84 72  Temp: 98.4 F (36.9 C) 98.5 F (36.9 C) 98.2 F (36.8 C) 98.2 F (36.8 C)  TempSrc: Oral Oral Oral Oral  Resp: 18 18 20 18   Height:      Weight:      SpO2: 98% 95% 97% 92%   Weight change:   Intake/Output Summary (Last 24 hours) at 04/06/13 1213 Last data filed at 04/06/13 0747  Gross per 24 hour  Intake   2595 ml  Output   3900 ml  Net  -1305 ml   Physiacl exam General: Sitting in a chair, NAD  HEENT: PERRL, EOMI, no scleral icterus  Cardiac: RRR, no rubs, murmurs or gallops  Pulm: clear to auscultation bilaterally, no wheezes, rales, or rhonchi  Abd: soft, nontender, nondistended, BS present  Ext: warm and well perfused, no pedal edema  Neuro: alert and oriented X3, cranial nerves II-XII grossly intact, strength and sensation to light touch equal in bilateral upper and lower extremities.  Lab Results: Basic Metabolic Panel:  Recent Labs Lab 04/05/13 1724 04/06/13 0803  NA 137 133*  K 4.1 3.9  CL 98 97  CO2 28 26  GLUCOSE 103* 72  BUN <3* <3*  CREATININE 0.96 0.91  CALCIUM 9.8 9.4   CBC:  Recent Labs Lab 03/31/13 0250  WBC 6.2  HGB 14.1  HCT 42.9  MCV 78.6  PLT 171   CBG:  Recent Labs Lab 04/05/13 1218 04/05/13 1634 04/06/13 0017 04/06/13 0358 04/06/13 0441 04/06/13 0756  GLUCAP 97 148* 163* 68* 159* 74   Fasting Lipid Panel:  Recent Labs Lab 04/06/13 0803  TRIG 935*    Micro Results: Recent Results (from the past 240 hour(s))  MRSA PCR SCREENING     Status: Abnormal   Collection Time    03/30/13  8:50 PM      Result Value Range Status   MRSA by PCR POSITIVE (*) NEGATIVE  Final   Comment:            The GeneXpert MRSA Assay (FDA     approved for NASAL specimens     only), is one component of a     comprehensive MRSA colonization     surveillance program. It is not     intended to diagnose MRSA     infection nor to guide or     monitor treatment for     MRSA infections.     RESULT CALLED TO, READ BACK BY AND VERIFIED WITH:     A MOHAMMED RN 2244 03/30/13 A BROWNING   Studies/Results: No results found. Medications: I have reviewed the patient's current medications. Scheduled Meds: . dextrose      . gemfibrozil  600 mg Oral BID AC  . heparin  5,000 Units Subcutaneous Q8H  . losartan  50 mg Oral Daily  . omega-3 acid ethyl esters  1 g Oral BID  . zolpidem  5 mg Oral Once   Continuous Infusions: . dextrose 10 % 1,000 mL with sodium chloride 154 mEq infusion 50 mL/hr at 04/06/13 0740  . dextrose 10 % 500 mL with insulin regular (NOVOLIN  R,HUMULIN R) 16 Units infusion 100 mL/hr at 04/06/13 0716   PRN Meds:.diphenhydrAMINE, ondansetron (ZOFRAN) IV, ondansetron, oxyCODONE, zolpidem  Assessment/Plan:  Pancreatitis- Acute 2/2 to hypertriglyceridemia - Patient reports resolved abdominal pain. Tolerating oral diet. TG on admission- 2263. TG this morning- 935, increased form 758 yesterday. Patient has been on insulin16u infusion and D10 to prevent hypoglycemia . Gemfibrozil and lovaza also in treatment regimen. Blood glucose this morning- 74, Na- 133.  - Patient will be discharged home today. - Continue Gemfibrozil- 600 BID as an out-pt - Continue home lovastatin 40mg  daily.  - Continue Lovaza  - Counsel pt on dietary modification on discharge.  - Continue monitoring TG levels as an out-pt. DEGENERATIVE DISC DISEASE, LUMBAR SPINE, WITH MYELOPATHY- Patient has chronic back pain, s/p 2 lower spinal surgeries and one cervical surgery. Has pain radiating to his LLE. Pain currently controlled on oxycodone. PT consulted and request outpatient PT.  - Continue pain  control  - Continue PT- as an out-pt. HTN- HCT was d/c on admission as it has a propensity to cause pancreatitis. Lorsatan 50mg  dly was given, with stable blood pressure control.  DVT PPx: SQ heparin and SCDS.   Code: Full.   Dispo: Disposition is deferred at this time, awaiting improvement of current medical problems.  Anticipated discharge in approximately to day.   The patient does have a current PCP Jonah Blue, DO) and does need an [facility] hospital follow-up appointment after discharge.  The patient does not know have transportation limitations that hinder transportation to clinic appointments.  .Services Needed at time of discharge: Y = Yes, Blank = No PT:   OT:   RN:   Equipment:   Other:     LOS: 7 days   Kennis Carina, MD 04/06/2013, 12:13 PM

## 2013-04-07 ENCOUNTER — Other Ambulatory Visit: Payer: Self-pay | Admitting: *Deleted

## 2013-04-07 MED ORDER — LOVASTATIN 40 MG PO TABS: 40.0000 mg | ORAL_TABLET | Freq: Every day | ORAL | Status: DC

## 2013-04-07 MED ORDER — HYDROCHLOROTHIAZIDE 25 MG PO TABS: 25.0000 mg | ORAL_TABLET | Freq: Every day | ORAL | Status: DC

## 2013-04-07 MED ORDER — GEMFIBROZIL 600 MG PO TABS: 600.0000 mg | ORAL_TABLET | Freq: Two times a day (BID) | ORAL | Status: DC

## 2013-04-07 MED ORDER — OMEGA-3-ACID ETHYL ESTERS 1 G PO CAPS: 1.0000 g | ORAL_CAPSULE | Freq: Two times a day (BID) | ORAL | Status: AC

## 2013-04-07 MED ORDER — LOSARTAN POTASSIUM 50 MG PO TABS: 50.0000 mg | ORAL_TABLET | Freq: Every day | ORAL | Status: DC

## 2013-04-07 NOTE — Telephone Encounter (Signed)
Pt called and stated Gemfibrozil and Lovaza were not sent to [facility] [address] from [facility] in [place]. Verbal order given to [facility].

## 2013-04-07 NOTE — Telephone Encounter (Signed)
I personally just sent all of [facility] to [facility] [address] [facility] electronically.

## 2013-04-08 LAB — PANCREATIC ELASTASE, FECAL: Pancreatic Elastase-1, Stool: >500 mcg/g

## 2013-04-10 NOTE — Discharge Summary (Signed)
  Date: 04/10/2013  Patient name: Jerome Adams  Medical record number: [id number]  Date of birth: June 20, 1961   This patient has been seen and the plan of care was discussed with the house staff. Please see their note for complete details. I concur with their findings and plan.  Jonah Blue, DO, FACP Faculty [facility] Internal Medicine Residency Program 04/10/2013, 11:51 AM

## 2013-04-11 ENCOUNTER — Ambulatory Visit (INDEPENDENT_AMBULATORY_CARE_PROVIDER_SITE_OTHER): Payer: PRIVATE HEALTH INSURANCE | Admitting: Internal Medicine

## 2013-04-11 ENCOUNTER — Ambulatory Visit (INDEPENDENT_AMBULATORY_CARE_PROVIDER_SITE_OTHER): Payer: PRIVATE HEALTH INSURANCE | Admitting: Dietician

## 2013-04-11 ENCOUNTER — Encounter: Payer: Self-pay | Admitting: Internal Medicine

## 2013-04-11 VITALS — BP 122/87 | HR 82 | Temp 97.9°F | Ht 70.0 in | Wt 208.7 lb

## 2013-04-11 DIAGNOSIS — K529 Noninfective gastroenteritis and colitis, unspecified: Secondary | ICD-10-CM

## 2013-04-11 DIAGNOSIS — Z8719 Personal history of other diseases of the digestive system: Secondary | ICD-10-CM

## 2013-04-11 DIAGNOSIS — R197 Diarrhea, unspecified: Secondary | ICD-10-CM

## 2013-04-11 DIAGNOSIS — E781 Pure hyperglyceridemia: Secondary | ICD-10-CM

## 2013-04-11 DIAGNOSIS — I10 Essential (primary) hypertension: Secondary | ICD-10-CM

## 2013-04-11 NOTE — Patient Instructions (Addendum)
Your SMART goal you set today: Eat fruit 2 times a week for the next 2 weeks-   Week 1:   Did you eat fruits 2x?  Week 2:  Did you eat fruit 2x?   Please make a follow up appointment for 3 -4 weeks  if this works with your schedule.   If not, we can follow up by phone [phone], Norm Parcel

## 2013-04-11 NOTE — Assessment & Plan Note (Signed)
Secondary to hypertriglyceridemia.  Patient reports only minimal abdominal pain and has very mild TTP of upper quadrants.  He is taking his medications as prescribed and trying to avoid high fat foods.  He will see our dietician today.

## 2013-04-11 NOTE — Assessment & Plan Note (Addendum)
Improved after hospitalization and addition of gemfibrozil.

## 2013-04-11 NOTE — Progress Notes (Signed)
I saw and evaluated the patient.  I personally confirmed the key portions of the history and exam documented by Dr. Hoffman and I reviewed pertinent patient test results.  The assessment, diagnosis, and plan were formulated together and I agree with the documentation in the resident's note. 

## 2013-04-11 NOTE — Assessment & Plan Note (Addendum)
Patient taking gemfibrozil and lovastatin as prescribed.  TG were 935 on discharge, and patient is clinically improved.  Patient instructed to continue current medications and we will see him back in 6-8 weeks to recheck a lipid panel and adjust medications as necessary.  Patient instructed to call and make a same day appointment if his abdominal pain worsens.  He also has an appointment to see Jerome Adams our dietician today.

## 2013-04-11 NOTE — Assessment & Plan Note (Signed)
BP Readings from Last 3 Encounters:  04/11/13 122/87  04/06/13 149/86  03/29/13 138/87    Lab Results  Component Value Date   NA 133* 04/06/2013   K 3.9 04/06/2013   CREATININE 0.91 04/06/2013    Assessment: Blood pressure control: controlled Progress toward BP goal:  at goal Comments:   Plan: Medications:  continue current medications HCTZ 25mg  QD, Losartan 50mg  QD Educational resources provided: brochure Self management tools provided:   Other plans: Patient doing well on current medications.

## 2013-04-11 NOTE — Patient Instructions (Signed)
1.  Continue taking your medications as prescribed. 2.  Lupita Leash will talk to you about foods to avoid. 3.  If you abdominal pain gets worse please call the clinic to get a same day appointment to have your Triglycerides checked.  Hypertriglyceridemia  Diet for High blood levels of Triglycerides Most fats in food are triglycerides. Triglycerides in your blood are stored as fat in your body. High levels of triglycerides in your blood may put you at a greater risk for heart disease and stroke.  Normal triglyceride levels are less than 150 mg/dL. Borderline high levels are 150-199 mg/dl. High levels are 200 - 499 mg/dL, and very high triglyceride levels are greater than 500 mg/dL. The decision to treat high triglycerides is generally based on the level. For people with borderline or high triglyceride levels, treatment includes weight loss and exercise. Drugs are recommended for people with very high triglyceride levels. Many people who need treatment for high triglyceride levels have metabolic syndrome. This syndrome is a collection of disorders that often include: insulin resistance, high blood pressure, blood clotting problems, high cholesterol and triglycerides. TESTING PROCEDURE FOR TRIGLYCERIDES  You should not eat 4 hours before getting your triglycerides measured. The normal range of triglycerides is between 10 and 250 milligrams per deciliter (mg/dl). Some people may have extreme levels (1000 or above), but your triglyceride level may be too high if it is above 150 mg/dl, depending on what other risk factors you have for heart disease.  People with high blood triglycerides may also have high blood cholesterol levels. If you have high blood cholesterol as well as high blood triglycerides, your risk for heart disease is probably greater than if you only had high triglycerides. High blood cholesterol is one of the main risk factors for heart disease. CHANGING YOUR DIET  Your weight can affect your  blood triglyceride level. If you are more than 20% above your ideal body weight, you may be able to lower your blood triglycerides by losing weight. Eating less and exercising regularly is the best way to combat this. Fat provides more calories than any other food. The best way to lose weight is to eat less fat. Only 30% of your total calories should come from fat. Less than 7% of your diet should come from saturated fat. A diet low in fat and saturated fat is the same as a diet to decrease blood cholesterol. By eating a diet lower in fat, you may lose weight, lower your blood cholesterol, and lower your blood triglyceride level.  Eating a diet low in fat, especially saturated fat, may also help you lower your blood triglyceride level. Ask your dietitian to help you figure how much fat you can eat based on the number of calories your caregiver has prescribed for you.  Exercise, in addition to helping with weight loss may also help lower triglyceride levels.   Alcohol can increase blood triglycerides. You may need to stop drinking alcoholic beverages.  Too much carbohydrate in your diet may also increase your blood triglycerides. Some complex carbohydrates are necessary in your diet. These may include bread, rice, potatoes, other starchy vegetables and cereals.  Reduce "simple" carbohydrates. These may include pure sugars, candy, honey, and jelly without losing other nutrients. If you have the kind of high blood triglycerides that is affected by the amount of carbohydrates in your diet, you will need to eat less sugar and less high-sugar foods. Your caregiver can help you with this.  Adding 2-4 grams of  fish oil (EPA+ DHA) may also help lower triglycerides. Speak with your caregiver before adding any supplements to your regimen. Following the Diet  Maintain your ideal weight. Your caregivers can help you with a diet. Generally, eating less food and getting more exercise will help you lose weight. Joining  a weight control group may also help. Ask your caregivers for a good weight control group in your area.  Eat low-fat foods instead of high-fat foods. This can help you lose weight too.  These foods are lower in fat. Eat MORE of these:   Dried beans, peas, and lentils.  Egg whites.  Low-fat cottage cheese.  Fish.  Lean cuts of meat, such as round, sirloin, rump, and flank (cut extra fat off meat you fix).  Whole grain breads, cereals and pasta.  Skim and nonfat dry milk.  Low-fat yogurt.  Poultry without the skin.  Cheese made with skim or part-skim milk, such as mozzarella, parmesan, farmers', ricotta, or pot cheese. These are higher fat foods. Eat LESS of these:   Whole milk and foods made from whole milk, such as American, blue, cheddar, monterey jack, and swiss cheese  High-fat meats, such as luncheon meats, sausages, knockwurst, bratwurst, hot dogs, ribs, corned beef, ground pork, and regular ground beef.  Fried foods. Limit saturated fats in your diet. Substituting unsaturated fat for saturated fat may decrease your blood triglyceride level. You will need to read package labels to know which products contain saturated fats.  These foods are high in saturated fat. Eat LESS of these:   Fried pork skins.  Whole milk.  Skin and fat from poultry.  Palm oil.  Butter.  Shortening.  Cream cheese.  Tomasa Blase.  Margarines and baked goods made from listed oils.  Vegetable shortenings.  Chitterlings.  Fat from meats.  Coconut oil.  Palm kernel oil.  Lard.  Cream.  Sour cream.  Fatback.  Coffee whiteners and non-dairy creamers made with these oils.  Cheese made from whole milk. Use unsaturated fats (both polyunsaturated and monounsaturated) moderately. Remember, even though unsaturated fats are better than saturated fats; you still want a diet low in total fat.  These foods are high in unsaturated fat:   Canola oil.  Sunflower  oil.  Mayonnaise.  Almonds.  Peanuts.  Pine nuts.  Margarines made with these oils.  Safflower oil.  Olive oil.  Avocados.  Cashews.  Peanut butter.  Sunflower seeds.  Soybean oil.  Peanut oil.  Olives.  Pecans.  Walnuts.  Pumpkin seeds. Avoid sugar and other high-sugar foods. This will decrease carbohydrates without decreasing other nutrients. Sugar in your food goes rapidly to your blood. When there is excess sugar in your blood, your liver may use it to make more triglycerides. Sugar also contains calories without other important nutrients.  Eat LESS of these:   Sugar, brown sugar, powdered sugar, jam, jelly, preserves, honey, syrup, molasses, pies, candy, cakes, cookies, frosting, pastries, colas, soft drinks, punches, fruit drinks, and regular gelatin.  Avoid alcohol. Alcohol, even more than sugar, may increase blood triglycerides. In addition, alcohol is high in calories and low in nutrients. Ask for sparkling water, or a diet soft drink instead of an alcoholic beverage. Suggestions for planning and preparing meals   Bake, broil, grill or roast meats instead of frying.  Remove fat from meats and skin from poultry before cooking.  Add spices, herbs, lemon juice or vinegar to vegetables instead of salt, rich sauces or gravies.  Use a non-stick skillet without  fat or use no-stick sprays.  Cool and refrigerate stews and broth. Then remove the hardened fat floating on the surface before serving.  Refrigerate meat drippings and skim off fat to make low-fat gravies.  Serve more fish.  Use less butter, margarine and other high-fat spreads on bread or vegetables.  Use skim or reconstituted non-fat dry milk for cooking.  Cook with low-fat cheeses.  Substitute low-fat yogurt or cottage cheese for all or part of the sour cream in recipes for sauces, dips or congealed salads.  Use half yogurt/half mayonnaise in salad recipes.  Substitute evaporated skim  milk for cream. Evaporated skim milk or reconstituted non-fat dry milk can be whipped and substituted for whipped cream in certain recipes.  Choose fresh fruits for dessert instead of high-fat foods such as pies or cakes. Fruits are naturally low in fat. When Dining Out   Order low-fat appetizers such as fruit or vegetable juice, pasta with vegetables or tomato sauce.  Select clear, rather than cream soups.  Ask that dressings and gravies be served on the side. Then use less of them.  Order foods that are baked, broiled, poached, steamed, stir-fried, or roasted.  Ask for margarine instead of butter, and use only a small amount.  Drink sparkling water, unsweetened tea or coffee, or diet soft drinks instead of alcohol or other sweet beverages. QUESTIONS AND ANSWERS ABOUT OTHER FATS IN THE BLOOD: SATURATED FAT, TRANS FAT, AND CHOLESTEROL What is trans fat? Trans fat is a type of fat that is formed when vegetable oil is hardened through a process called hydrogenation. This process helps makes foods more solid, gives them shape, and prolongs their shelf life. Trans fats are also called hydrogenated or partially hydrogenated oils.  What do saturated fat, trans fat, and cholesterol in foods have to do with heart disease? Saturated fat, trans fat, and cholesterol in the diet all raise the level of LDL "bad" cholesterol in the blood. The higher the LDL cholesterol, the greater the risk for coronary heart disease (CHD). Saturated fat and trans fat raise LDL similarly.  What foods contain saturated fat, trans fat, and cholesterol? High amounts of saturated fat are found in animal products, such as fatty cuts of meat, chicken skin, and full-fat dairy products like butter, whole milk, cream, and cheese, and in tropical vegetable oils such as palm, palm kernel, and coconut oil. Trans fat is found in some of the same foods as saturated fat, such as vegetable shortening, some margarines (especially hard or  stick margarine), crackers, cookies, baked goods, fried foods, salad dressings, and other processed foods made with partially hydrogenated vegetable oils. Small amounts of trans fat also occur naturally in some animal products, such as milk products, beef, and lamb. Foods high in cholesterol include liver, other organ meats, egg yolks, shrimp, and full-fat dairy products. How can I use the new food label to make heart-healthy food choices? Check the Nutrition Facts panel of the food label. Choose foods lower in saturated fat, trans fat, and cholesterol. For saturated fat and cholesterol, you can also use the Percent Daily Value (%DV): 5% DV or less is low, and 20% DV or more is high. (There is no %DV for trans fat.) Use the Nutrition Facts panel to choose foods low in saturated fat and cholesterol, and if the trans fat is not listed, read the ingredients and limit products that list shortening or hydrogenated or partially hydrogenated vegetable oil, which tend to be high in trans fat. POINTS TO  REMEMBER:   Discuss your risk for heart disease with your caregivers, and take steps to reduce risk factors.  Change your diet. Choose foods that are low in saturated fat, trans fat, and cholesterol.  Add exercise to your daily routine if it is not already being done. Participate in physical activity of moderate intensity, like brisk walking, for at least 30 minutes on most, and preferably all days of the week. No time? Break the 30 minutes into three, 10-minute segments during the day.  Stop smoking. If you do smoke, contact your caregiver to discuss ways in which they can help you quit.  Do not use street drugs.  Maintain a normal weight.  Maintain a healthy blood pressure.  Keep up with your blood work for checking the fats in your blood as directed by your caregiver. Document Released: 05/01/2004 Document Revised: 01/13/2012 Document Reviewed: 11/27/2008 [facility] Patient Information 2014 [place],  [place].

## 2013-04-11 NOTE — Progress Notes (Signed)
Patient ID: Jerome Adams, male   DOB: [birth year], 52 y.o.   MRN: [id number]   Subjective:   Patient ID: Jerome Adams male   DOB: 25-Dec-1960 52 y.o.   MRN: [id number]  HPI: [name] is a 52 y.o. male with a PMH of hypertriglyceridemia who presents today for follow up after recent hospitalization for pancreatitis secondary to hypertriglyceridemia.  He reports he has been taking his medications as prescribed and has not had any issues obtaining or taking his medications.  He reports that his abdominal pain has continued to decrease.  He reports that he has tried to make changes to his diet but is unsure if they will make a big difference, he has an appointment with our dietician later today. He reports his chronic diarrhea has improved after the hospitalization and thinks the gemfibrozil has helped. He continues to have chronic low back pain without any loss of bowel or bladder function or significant weakness.  He has an upcoming appointment with pain management this week.    Past Medical History  Diagnosis Date  . Back pain   . PUD (peptic ulcer disease)   . Hypertension   . Allergy   . GERD (gastroesophageal reflux disease)     takes  otc  . Spinal cord stimulator status     has had for 1 yr--inserted by [facility]  . Sleep apnea     was tested here at [facility].Marland KitchenMarland KitchenMarland KitchenNever heard anymore about it (03/30/2013)  . Arthritis     "joints" (03/30/2013)  . Chronic lower back pain   . Depression   . Hepatitis C     "tested for it; never told I had it" (03/30/2013)   Current Outpatient Prescriptions  Medication Sig Dispense Refill  . diazepam (VALIUM) 5 MG tablet Take 5 mg by mouth every 12 (twelve) hours as needed for anxiety.       [name] gemfibrozil (LOPID) 600 MG tablet Take 1 tablet (600 mg total) by mouth 2 (two) times daily before a meal.  60 tablet  5  . hydrochlorothiazide (HYDRODIURIL) 25 MG tablet Take 1 tablet (25 mg total) by mouth daily.  30 tablet  3  . losartan (COZAAR)  50 MG tablet Take 1 tablet (50 mg total) by mouth daily.  30 tablet  3  . lovastatin (MEVACOR) 40 MG tablet Take 1 tablet (40 mg total) by mouth daily.  30 tablet  2  . Multiple Vitamin (MULITIVITAMIN WITH MINERALS) TABS Take 1 tablet by mouth daily.      [name] omega-3 acid ethyl esters (LOVAZA) 1 G capsule Take 1 capsule (1 g total) by mouth 2 (two) times daily.  60 capsule  5  . oxyCODONE (ROXICODONE) 15 MG immediate release tablet Take 15 mg by mouth every 4 (four) hours as needed for pain.      . pantoprazole (PROTONIX) 40 MG tablet Take 1 tablet (40 mg total) by mouth daily.  90 tablet  1  . sertraline (ZOLOFT) 50 MG tablet Take 1 tablet (50 mg total) by mouth daily.  90 tablet  1  . sucralfate (CARAFATE) 1 GM/10ML suspension Take 10 mLs (1 g total) by mouth 4 (four) times daily.  420 mL  0   No current facility-administered medications for this visit.   Family History  Problem Relation Age of Onset  . Heart disease Father   . Heart disease Sister   . Breast cancer Sister    History   Social History  .  Marital Status: Divorced    Spouse Name: N/A    Number of Children: N/A  . Years of Education: N/A   Social History Main Topics  . Smoking status: Former Smoker -- 0.40 packs/day for 30 years    Types: Cigarettes    Quit date: 03/23/2010  . Smokeless tobacco: Never Used  . Alcohol Use: 2.4 oz/week    4 Cans of beer per week     Comment: 03/30/2013 "last alcohol was a couple weeks ago; usually 2, 24oz once/wk"  . Drug Use: Yes    Special: Cocaine     Comment: 03/30/2013 "CLEAN SINCE 1998"  . Sexual Activity: Yes   Other Topics Concern  . Not on file   Social History Narrative  . No narrative on file   Review of Systems: Review of Systems  Constitutional: Positive for malaise/fatigue. Negative for fever, chills and weight loss.  HENT: Negative for congestion.   Eyes: Negative for blurred vision.  Respiratory: Negative for cough, sputum production and shortness of breath.    Cardiovascular: Negative for chest pain, palpitations and leg swelling.  Gastrointestinal: Positive for diarrhea (improved). Negative for heartburn, nausea, vomiting, abdominal pain and constipation.  Genitourinary: Negative for dysuria.  Skin: Negative for rash.  Neurological: Positive for dizziness. Negative for headaches.  Psychiatric/Behavioral: Negative for depression.    Objective:  Physical Exam: Filed Vitals:   04/11/13 0832  Temp: 97.9 F (36.6 C)  TempSrc: Oral  Weight: 208 lb 11.2 oz (94.666 kg)  SpO2: 100%  Physical Exam  Nursing note and vitals reviewed. Constitutional: He is well-developed, well-nourished, and in no distress. No distress.  HENT:  Head: Normocephalic and atraumatic.  Eyes: Conjunctivae are normal.  Cardiovascular: Normal rate, regular rhythm, normal heart sounds and intact distal pulses.   No murmur heard. Pulmonary/Chest: Effort normal and breath sounds normal. No respiratory distress. He has no wheezes. He has no rales.  Abdominal: Soft. Bowel sounds are normal. He exhibits no distension. There is tenderness (mild TTP over upper quadrants).  Musculoskeletal: He exhibits no edema.  Skin: Skin is warm and dry. He is not diaphoretic.  Psychiatric: Affect and judgment normal.     Assessment & Plan:   See Problem Based Assessment and Plan

## 2013-04-11 NOTE — Progress Notes (Signed)
Medical Nutrition Therapy:  Appt start time: 0936 end time:  1015.  Assessment:  Primary concerns today: Diet for high triglycerides.  Has read information and in past few days limited higher fat snack portions and eliminated fried foods.  Usual eating pattern prior to recent hospitalization includes fast food daily, high fat snacks, few vegetables and no fruit.  Usual physical activity includes sedentary activities while attending school and due to injured back. Patient verbalizes good understanding how to limit alcohol and high fat foods. Verbalizes high importance to making lifestyle to be here for his son. He also rates his self confidence as high, but says he has competing values in stress from school.  Progress Towards Goal(s):  In progress.   Nutritional Diagnosis:  NB-1.4 Self-monitoring deficit As related to lack of formal way of self monitoing progress.  As evidenced by his report.    Intervention:  Nutrition education/reinforcement about diet to lower triglycerides Goal setting- assisted patient in setting smart goals to encourage small changes and self monitoring of progress.   Monitoring/Evaluation:  Dietary intake, exercise, and body weight in 3 week(s).

## 2013-05-09 ENCOUNTER — Encounter: Payer: PRIVATE HEALTH INSURANCE | Admitting: Dietician

## 2013-05-11 ENCOUNTER — Ambulatory Visit (HOSPITAL_BASED_OUTPATIENT_CLINIC_OR_DEPARTMENT_OTHER): Payer: PRIVATE HEALTH INSURANCE | Attending: Internal Medicine | Admitting: Radiology

## 2013-05-11 VITALS — Ht 69.0 in | Wt 207.0 lb

## 2013-05-11 DIAGNOSIS — R0989 Other specified symptoms and signs involving the circulatory and respiratory systems: Secondary | ICD-10-CM | POA: Insufficient documentation

## 2013-05-11 DIAGNOSIS — G4761 Periodic limb movement disorder: Secondary | ICD-10-CM | POA: Insufficient documentation

## 2013-05-11 DIAGNOSIS — G4733 Obstructive sleep apnea (adult) (pediatric): Secondary | ICD-10-CM | POA: Insufficient documentation

## 2013-05-11 DIAGNOSIS — R0609 Other forms of dyspnea: Secondary | ICD-10-CM | POA: Insufficient documentation

## 2013-05-12 ENCOUNTER — Other Ambulatory Visit: Payer: Self-pay | Admitting: Internal Medicine

## 2013-05-12 ENCOUNTER — Other Ambulatory Visit: Payer: Self-pay | Admitting: *Deleted

## 2013-05-12 DIAGNOSIS — K219 Gastro-esophageal reflux disease without esophagitis: Secondary | ICD-10-CM

## 2013-05-12 MED ORDER — SUCRALFATE 1 GM/10ML PO SUSP: 1.0000 g | Freq: Four times a day (QID) | ORAL | Status: DC

## 2013-05-15 DIAGNOSIS — G4733 Obstructive sleep apnea (adult) (pediatric): Secondary | ICD-10-CM

## 2013-05-15 NOTE — Procedures (Signed)
NAME:  Jerome Adams, Jerome Adams              ACCOUNT NO.:  [id number]  MEDICAL RECORD NO.:  [id number]          PATIENT TYPE:  OUT  LOCATION:  SLEEP CENTER                 FACILITY:  [facility]  PHYSICIAN:  Paxten Appelt D. Maple Hudson, MD, FCCP, FACPDATE OF BIRTH:  04-18-61  DATE OF STUDY:  05/11/2013                           NOCTURNAL POLYSOMNOGRAM  REFERRING PHYSICIAN:  Ileana Roup, M.D.  INDICATION FOR STUDY:  Insomnia with sleep apnea.  EPWORTH SLEEPINESS SCORE:  1/24.  BMI 29.7, weight 207 pounds, height 70 inches, neck 17 inches.  MEDICATIONS:  Home medications are charted for review.  A baseline diagnostic NPSG on October 29, 2011 had recorded AHI of 13 per hour.  Body weight was 205 pounds for that study.  SLEEP ARCHITECTURE:  Total sleep time, 295 minutes with sleep efficiency 74.3%.  Stage I was 11.9%, stage II 69.5%, stage III absent, REM 18.6% of total sleep time.  Sleep latency 27.5 minutes, REM latency 263 minutes.  Awake after sleep onset 74 minutes.  Arousal index 39.3. Bedtime medication:  Valium.  RESPIRATORY DATA:  Apnea-hypopnea index (AHI) 5.7 per hour.  A total of 28 events was scored including 10 obstructive apneas and 18 hypopneas. All events were associated with supine sleep position.  REM AHI 9.8 per hour.  There were not enough events to qualify for split protocol CPAP titration on this study.  OXYGEN DATA:  Moderate snoring with oxygen desaturation to a nadir of 89% and mean oxygen saturation through the study of 94.3% on room air.  CARDIAC DATA:  Sinus rhythm with occasional PVC.  MOVEMENT/PARASOMNIA:  Periodic limb movement with arousal.  A total of 867 limb jerks were counted, of which 116 were associated with arousal or awakening for periodic limb movement with arousal index of 23.6 per hour.  Bathroom x1.  IMPRESSION/RECOMMENDATION: 1. Minimal obstructive sleep apnea/hypopnea syndrome, AHI 5.7 per hour     (the normal range for adults is an AHI between 0 and 5  events per     hour).  Moderate snoring with oxygen desaturation to a nadir of 89%     and mean oxygen saturation through the study of 94.3% on room air. 2. Severe periodic limb movement with arousal syndrome.  A total of     867 limb jerks were counted, of which 116 were associated with     arousals or awakening for periodic limb movement with arousal index     of 23.6 events per     hour.  Consider a trial of specific therapy such as Requip or Mirapex     if appropriate. 3. A previous sleep study on October 29, 2011 had recorded an AHI of 13     per hour with body weight 205 pounds.     Zula Hovsepian D. Maple Hudson, MD, [facility], FACP Diplomate, American Board of Sleep Medicine    CDY/MEDQ  D:  05/15/2013 14:25:05  T:  05/15/2013 20:37:02  Job:  [id number]

## 2013-05-26 ENCOUNTER — Encounter: Payer: Self-pay | Admitting: Internal Medicine

## 2013-05-26 ENCOUNTER — Ambulatory Visit (INDEPENDENT_AMBULATORY_CARE_PROVIDER_SITE_OTHER): Payer: PRIVATE HEALTH INSURANCE | Admitting: Internal Medicine

## 2013-05-26 VITALS — BP 134/79 | HR 64 | Temp 97.4°F | Ht 70.0 in | Wt 204.8 lb

## 2013-05-26 DIAGNOSIS — G4761 Periodic limb movement disorder: Secondary | ICD-10-CM

## 2013-05-26 DIAGNOSIS — G2581 Restless legs syndrome: Secondary | ICD-10-CM | POA: Insufficient documentation

## 2013-05-26 DIAGNOSIS — E781 Pure hyperglyceridemia: Secondary | ICD-10-CM

## 2013-05-26 LAB — IRON AND TIBC
%SAT: 27 % (ref 20–55)
Iron: 131 ug/dL (ref 42–165)
TIBC: 489 ug/dL — ABNORMAL HIGH (ref 215–435)
UIBC: 358 ug/dL (ref 125–400)

## 2013-05-26 LAB — TRIGLYCERIDES: Triglycerides: 410 mg/dL — ABNORMAL HIGH (ref <150)

## 2013-05-26 MED ORDER — ROPINIROLE HCL 1 MG PO TABS: 3.0000 mg | ORAL_TABLET | Freq: Every day | ORAL | Status: DC

## 2013-05-26 MED ORDER — MOMETASONE FUROATE 50 MCG/ACT NA SUSP: 2.0000 | Freq: Every day | NASAL | Status: DC | PRN

## 2013-05-26 MED ORDER — ROPINIROLE HCL 0.5 MG PO TABS: 0.2500 mg | ORAL_TABLET | Freq: Every day | ORAL | Status: DC

## 2013-05-26 NOTE — Patient Instructions (Signed)
General Instructions: Your symptoms are likely due to Periodic Limb Movement Disorder (similar to Restless Leg Syndrome). -we are checking your iron levels today -start the medication Requip on the following schedule.  Take this 1-3 hours before bedtime, daily. --Day 1-2: Take 0.25 mg every evening --Day 3-7: Take 0.5 mg every evening --Week 2: Take 1 mg every evening --Week 3: Take 1.5 mg every evening --Week 4: Take 2 mg every evening --Week 5: Take 2.5 mg every evening --Week 6: Take 3 mg every evening  For your symptoms of runny nose, use Nasonex nasal spray, daily as needed.  Please return for a follow-up visit in about 3 months.   Treatment Goals:  Goals (1 Years of Data) as of 05/26/13   None      Progress Toward Treatment Goals:  Treatment Goal 05/26/2013  Blood pressure at goal  Stop smoking -    Self Care Goals & Plans:  Self Care Goal 05/26/2013  Manage my medications take my medicines as prescribed; bring my medications to every visit  Monitor my health -  Eat healthy foods -  Be physically active -    No flowsheet data found.   Care Management & Community Referrals:  Referral 05/26/2013  Referrals made for care management support none needed      Restless Legs Syndrome Restless legs syndrome is a movement disorder. It may also be called a sensori-motor disorder.  CAUSES  No one knows what specifically causes restless legs syndrome, but it tends to run in families. It is also more common in people with low iron, in pregnancy, in people who need dialysis, and those with nerve damage (neuropathy).Some medications may make restless legs syndrome worse.Those medications include drugs to treat high blood pressure, some heart conditions, nausea, colds, allergies, and depression. SYMPTOMS Symptoms include uncomfortable sensations in the legs. These leg sensations are worse during periods of inactivity or rest. They are also worse while sitting or lying  down. Individuals that have the disorder describe sensations in the legs that feel like:  Pulling.  Drawing.  Crawling.  Worming.  Boring.  Tingling.  Pins and needles.  Prickling.  Pain. The sensations are usually accompanied by an overwhelming urge to move the legs. Sudden muscle jerks may also occur. Movement provides temporary relief from the discomfort. In rare cases, the arms may also be affected. Symptoms may interfere with going to sleep (sleep onset insomnia). Restless legs syndrome may also be related to periodic limb movement disorder (PLMD). PLMD is another more common motor disorder. It also causes interrupted sleep. The symptoms from PLMD usually occur most often when you are awake. TREATMENT  Treatment for restless legs syndrome is symptomatic. This means that the symptoms are treated.   Massage and cold compresses may provide temporary relief.  Walk, stretch, or take a cold or hot bath.  Get regular exercise and a good night's sleep.  Avoid caffeine, alcohol, nicotine, and medications that can make it worse.  Do activities that provide mental stimulation like discussions, needlework, and video games. These may be helpful if you are not able to walk or stretch. Some medications are effective in relieving the symptoms. However, many of these medications have side effects. Ask your caregiver about medications that may help your symptoms. Correcting iron deficiency may improve symptoms for some patients. Document Released: 07/04/2002 Document Revised: 10/06/2011 Document Reviewed: 10/10/2010 [facility] Patient Information 2014 [place], [place].

## 2013-05-26 NOTE — Progress Notes (Signed)
HPI The patient is a 52 y.o. male with a history of HCV, HTN, presenting for an acute visit for sleep study results.  The patient notes difficulty initiating and maintaining sleep, going on for "the majority of his life".  He has never tried treatment for this.  He notes tossing and turning during his sleep, waking up feeling not rested, and impairment in daily activities.  The patient's partner notes that he snores a lot, and has "seizures" in his sleep, no loss of bowel/bladder, no tongue biting.  The patient doesn't smoke, drink alcohol, or drink caffeine.  Sleep study shows 867 limb jerks during the observed study, with an AH of 5.7.  The patient notes a 4-day history of runny nose, sore throat, non-productive cough, sinus pressure.  No fevers, chills.  ROS: General: no fevers, chills, changes in weight, changes in appetite Skin: no rash HEENT: see HPI Pulm: no dyspnea, wheezing CV: no chest pain, palpitations, shortness of breath Abd: no abdominal pain, nausea/vomiting, diarrhea/constipation GU: no dysuria, hematuria, polyuria Ext: no arthralgias, myalgias Neuro: no weakness, numbness, or tingling  Filed Vitals:   05/26/13 1408  BP: 134/79  Pulse: 64  Temp: 97.4 F (36.3 C)    PEX General: alert, cooperative, and in no apparent distress HEENT: pupils equal round and reactive to light, vision grossly intact, oropharynx clear and non-erythematous  Neck: supple Lungs: clear to ascultation bilaterally, normal work of respiration, no wheezes, rales, ronchi Heart: regular rate and rhythm, no murmurs, gallops, or rubs Abdomen: soft, non-tender, non-distended, normal bowel sounds Extremities:no cyanosis, clubbing, or edema Neurologic: alert & oriented X3, cranial nerves II-XII intact, strength grossly intact, sensation intact to light touch  Current Outpatient Prescriptions on File Prior to Visit  Medication Sig Dispense Refill  . diazepam (VALIUM) 5 MG tablet Take 5 mg by mouth  every 12 (twelve) hours as needed for anxiety.       [name] gemfibrozil (LOPID) 600 MG tablet Take 1 tablet (600 mg total) by mouth 2 (two) times daily before a meal.  60 tablet  5  . hydrochlorothiazide (HYDRODIURIL) 25 MG tablet Take 1 tablet (25 mg total) by mouth daily.  30 tablet  3  . losartan (COZAAR) 50 MG tablet Take 1 tablet (50 mg total) by mouth daily.  30 tablet  3  . lovastatin (MEVACOR) 40 MG tablet Take 1 tablet (40 mg total) by mouth daily.  30 tablet  2  . Multiple Vitamin (MULITIVITAMIN WITH MINERALS) TABS Take 1 tablet by mouth daily.      [name] omega-3 acid ethyl esters (LOVAZA) 1 G capsule Take 1 capsule (1 g total) by mouth 2 (two) times daily.  60 capsule  5  . oxyCODONE (ROXICODONE) 15 MG immediate release tablet Take 15 mg by mouth every 4 (four) hours as needed for pain.      . pantoprazole (PROTONIX) 40 MG tablet Take 1 tablet (40 mg total) by mouth daily.  90 tablet  1  . sertraline (ZOLOFT) 50 MG tablet Take 1 tablet (50 mg total) by mouth daily.  90 tablet  1  . sucralfate (CARAFATE) 1 GM/10ML suspension Take 10 mLs (1 g total) by mouth 4 (four) times daily.  420 mL  5  . sucralfate (CARAFATE) 1 GM/10ML suspension Take 10 mLs (1 g total) by mouth 4 (four) times daily.  420 mL  0  . traMADol (ULTRAM) 50 MG tablet        No current facility-administered medications on file prior  to visit.    Assessment/Plan  Sinusitis - four-day history of sinus symptoms, likely viral versus allergic -Start Nasonex for symptomatic treatment -If symptoms do not improve, patient to return to care.

## 2013-05-26 NOTE — Assessment & Plan Note (Signed)
The patient has a history of hypertriglyceridemia. We will check triglyceride level today, to ensure that regimen of gemfibrozil and lovastatin is effective. -Check triglyceride level -Continue gemfibrozil, lovastatin

## 2013-05-26 NOTE — Assessment & Plan Note (Signed)
The patient's symptoms are consistent with PLMD. The patient had a colonoscopy in April 2013, which showed 2 polyps (tubular adenoma and normal colonic mucosa).  MCV has been borderline low, and iron stores have never been checked. -Check ferritin and iron -Start treatment with ropinirole

## 2013-05-27 LAB — FERRITIN: Ferritin: 92 ng/mL (ref 22–322)

## 2013-06-06 NOTE — Progress Notes (Signed)
Case discussed with Dr. Brown at the time of the visit.  We reviewed the resident's history and exam and pertinent patient test results.  I agree with the assessment, diagnosis and plan of care documented in the resident's note. 

## 2013-07-14 ENCOUNTER — Encounter: Payer: Self-pay | Admitting: Internal Medicine

## 2013-07-14 ENCOUNTER — Encounter: Payer: PRIVATE HEALTH INSURANCE | Admitting: Internal Medicine

## 2013-09-01 ENCOUNTER — Ambulatory Visit: Payer: PRIVATE HEALTH INSURANCE | Admitting: Internal Medicine

## 2013-09-04 ENCOUNTER — Other Ambulatory Visit: Payer: Self-pay | Admitting: Internal Medicine

## 2013-10-05 ENCOUNTER — Encounter: Payer: Self-pay | Admitting: Pulmonary Disease

## 2013-10-05 ENCOUNTER — Ambulatory Visit (INDEPENDENT_AMBULATORY_CARE_PROVIDER_SITE_OTHER): Payer: PRIVATE HEALTH INSURANCE | Admitting: Pulmonary Disease

## 2013-10-05 VITALS — BP 148/102 | HR 65 | Temp 98.1°F | Ht 70.0 in | Wt 210.0 lb

## 2013-10-05 DIAGNOSIS — Z72821 Inadequate sleep hygiene: Secondary | ICD-10-CM

## 2013-10-05 DIAGNOSIS — R0683 Snoring: Secondary | ICD-10-CM | POA: Insufficient documentation

## 2013-10-05 DIAGNOSIS — F518 Other sleep disorders not due to a substance or known physiological condition: Secondary | ICD-10-CM

## 2013-10-05 DIAGNOSIS — G2581 Restless legs syndrome: Secondary | ICD-10-CM

## 2013-10-05 DIAGNOSIS — G4733 Obstructive sleep apnea (adult) (pediatric): Secondary | ICD-10-CM

## 2013-10-05 MED ORDER — ROPINIROLE HCL 0.5 MG PO TABS: ORAL_TABLET | ORAL | Status: DC

## 2013-10-05 NOTE — Addendum Note (Signed)
Addended by: Virl Cagey on: 10/05/2013 03:45 PM   Modules accepted: Orders

## 2013-10-05 NOTE — Assessment & Plan Note (Signed)
The patient has minimal obstructive sleep apnea, with an AHI of 5.7 events per hour. I really do not think this is his sleep issue, but have encouraged him to work on weight loss in order to improve his snoring.

## 2013-10-05 NOTE — Assessment & Plan Note (Signed)
The patient has classic restless leg syndrome, and will need to be treated with a dopamine agonist. I suspect this is much more of an issue than his minimal sleep disordered breathing. His primary physician put him on Requip, but he did not take it. I have had a long discussion with him about RLS, and how it can greatly disrupts sleep. He has had a recent iron panel that was normal.

## 2013-10-05 NOTE — Assessment & Plan Note (Signed)
The patient has very poor sleep hygiene, going to bed at 2 AM, and getting up at noon to start his day. He also takes naps during the day as well. I stressed to him the importance of getting up no later than 7 to 8 AM, in order to take you damage of early morning sunlight which can reset his circadian rhythm cycle.  He also needs to avoid all naps during the day, and if he is able to stick with this regimen, I suspect he will be able to get to sleep before 2 AM. If he continues to have issues, I would highly recommend referral to a psychologist for cognitive behavioral therapy. I will leave that to his primary care physician.

## 2013-10-05 NOTE — Progress Notes (Signed)
Subjective:    Patient ID: Jerome Adams, male    DOB: [birth year], 53 y.o.   MRN: [id number]  HPI The patient is a 53 year old male who I've been asked to see for an abnormal sleep study and difficulties with sleep. He has been noted to have snoring, as well as rare witnessed apneas during sleep. The patient starts his sleep cycle at 2 AM each night, and does this because he has a hard time getting to sleep. He is also worried about awakening to early and not being able to reinitiate sleep. He has frequent awakenings during the night, and does not get up to start his day until noon. On top of that, he takes naps during the day as well. He has had a recent sleep study that shows minimal sleep apnea, with an AHI of only 5.7 events per hour. He did however have 867 periodic limb movements, with an arousal index of 24 per hour. The patient admits to classic RLS symptoms, with discomfort in his legs in the evening that is improved with movement. He also has kicking during the night. He has had a recent iron panel that was unremarkable, and was started on Requip by his primary physician. Unfortunately, he did not take this.   Sleep Questionnaire What time do you typically go to bed?( Between what hours) 2:00 am 2:00 am at 1436 on 10/05/13 by Maurice March, RN How long does it take you to fall asleep? 1 hour 1 hour at 1436 on 10/05/13 by Maurice March, RN How many times during the night do you wake up? 4 4 at 1436 on 10/05/13 by Maurice March, RN What time do you get out of bed to start your day? 1200 1200 at 1436 on 10/05/13 by Maurice March, RN Do you drive or operate heavy machinery in your occupation? No No at 1436 on 10/05/13 by Maurice March, RN How much has your weight changed (up or down) over the past two years? (In pounds) 10 lb (4.536 kg) 10 lb (4.536 kg) at 1436 on 10/05/13 by Maurice March, RN Have you ever had a sleep study before? Yes Yes at 1436 on 10/05/13 by Maurice March, RN If yes, location of study? [place] per pt [facility] per pt at 1436 on 10/05/13 by Maurice March, RN If yes, date of study? pt unsure pt unsure at 1436 on 10/05/13 by Maurice March, RN Do you currently use CPAP? No No at 1436 on 10/05/13 by Maurice March, RN Do you wear oxygen at any time? No No at 1436 on 10/05/13 by Maurice March, RN   Review of Systems  Constitutional: Negative for fever and unexpected weight change.  HENT: Positive for congestion. Negative for dental problem, ear pain, nosebleeds, postnasal drip, rhinorrhea, sinus pressure, sneezing, sore throat and trouble swallowing.   Eyes: Positive for redness. Negative for itching.  Respiratory: Positive for shortness of breath. Negative for cough, chest tightness and wheezing.   Cardiovascular: Negative for palpitations and leg swelling.  Gastrointestinal: Negative for nausea and vomiting.  Genitourinary: Negative for dysuria.  Musculoskeletal: Negative for joint swelling.  Skin: Negative for rash.  Neurological: Positive for headaches.  Hematological: Does not bruise/bleed easily.  Psychiatric/Behavioral: Negative for dysphoric mood. The patient is not nervous/anxious.        Objective:   Physical Exam Constitutional:  Overweight male, no acute distress  HENT:  Nares with deviated septum to  left with near obstruction.  Oropharynx without exudate, palate and uvula thick and elongated  Eyes:  Perrla, eomi, no scleral icterus  Neck:  No JVD, no TMG  Cardiovascular:  Normal rate, regular rhythm, no rubs or gallops.  No murmurs        Intact distal pulses  Pulmonary :  Normal breath sounds, no stridor or respiratory distress   No rales, rhonchi, or wheezing  Abdominal:  Soft, nondistended, bowel sounds present.  No tenderness noted.   Musculoskeletal:  No lower extremity edema noted.  Lymph Nodes:  No cervical lymphadenopathy noted  Skin:  No cyanosis noted  Neurologic:  Alert, appropriate,  moves all 4 extremities without obvious deficit.         Assessment & Plan:

## 2013-10-05 NOTE — Patient Instructions (Signed)
Will start on requip 0.5mg  one each night after dinner for a week.  Then increase to 2 each night after dinner and stay there. Try and get to bed no later than midnight each night, then start your day no later than 8 am no matter how long or little you sleep.  Once you are up, do NOT take a nap of any kind!! You may need referral to a behavioral therapist by your primary md if you continue to have issues with sleep onset.  Work on weight loss to help your snoring and minimal sleep apnea.  followup with me again in 3 weeks.

## 2013-10-18 ENCOUNTER — Other Ambulatory Visit: Payer: Self-pay | Admitting: Internal Medicine

## 2013-10-18 ENCOUNTER — Encounter: Payer: Self-pay | Admitting: Gastroenterology

## 2013-10-26 ENCOUNTER — Ambulatory Visit: Payer: PRIVATE HEALTH INSURANCE | Admitting: Pulmonary Disease

## 2013-11-03 ENCOUNTER — Encounter: Payer: Self-pay | Admitting: Internal Medicine

## 2013-11-14 ENCOUNTER — Ambulatory Visit: Payer: PRIVATE HEALTH INSURANCE | Admitting: Pulmonary Disease

## 2013-11-17 ENCOUNTER — Encounter: Payer: Self-pay | Admitting: Pulmonary Disease

## 2013-11-17 ENCOUNTER — Ambulatory Visit (INDEPENDENT_AMBULATORY_CARE_PROVIDER_SITE_OTHER): Payer: Medicare Other | Admitting: Pulmonary Disease

## 2013-11-17 VITALS — BP 128/84 | HR 64 | Temp 97.9°F | Ht 70.0 in | Wt 213.2 lb

## 2013-11-17 DIAGNOSIS — G2581 Restless legs syndrome: Secondary | ICD-10-CM

## 2013-11-17 DIAGNOSIS — G4700 Insomnia, unspecified: Secondary | ICD-10-CM

## 2013-11-17 DIAGNOSIS — G4733 Obstructive sleep apnea (adult) (pediatric): Secondary | ICD-10-CM

## 2013-11-17 NOTE — Patient Instructions (Signed)
Increase your requip to 3 tabs after dinner each night for your legs. Work on weight loss to help your minimal sleep apnea Continue to work on sleep habits as we discussed. Will send a note to your primary physician recommending referral to a behavioral specialist for your insomnia. followup with me again in 58mos.

## 2013-11-17 NOTE — Assessment & Plan Note (Signed)
The patient sleep apnea is minimal, and I do not think it is contributing to his current sleep issues. I have encouraged him to work aggressively on weight loss.

## 2013-11-17 NOTE — Assessment & Plan Note (Signed)
The patient continues to have poor sleep hygiene, despite my recommendations from the last visit. I think he probably needs to be referred to a behavioral therapist for cognitive behavioral therapy. I will leave that to his primary care physician.

## 2013-11-17 NOTE — Progress Notes (Signed)
   Subjective:    Patient ID: Jerome Adams, male    DOB: 09/15/1960, 53 y.o.   MRN: [id number]  HPI Patient comes in today for followup of his multiple sleep issues. He has the restless leg syndrome, minimal obstructive sleep apnea on a sleep study, and poor sleep hygiene with insomnia. At the last visit, I started him on a dopamine agonist for his RLS, and he is definitely seen improvement in his symptoms. Despite this, he continues to have breakthrough. He has not been able to stick with the good sleep hygiene that I outlined at the last visit, and continues to have severe insomnia symptoms.   Review of Systems  Constitutional: Negative for fever and unexpected weight change.  HENT: Negative for congestion, dental problem, ear pain, nosebleeds, postnasal drip, rhinorrhea, sinus pressure, sneezing, sore throat and trouble swallowing.        Allergies/Sinus issue d/t pollen  Eyes: Negative for redness and itching.  Respiratory: Negative for cough, chest tightness, shortness of breath and wheezing.   Cardiovascular: Negative for palpitations and leg swelling.  Gastrointestinal: Negative for nausea and vomiting.  Genitourinary: Negative for dysuria.  Musculoskeletal: Negative for joint swelling.  Skin: Negative for rash.  Neurological: Negative for headaches.  Hematological: Does not bruise/bleed easily.  Psychiatric/Behavioral: Negative for dysphoric mood. The patient is not nervous/anxious.        Objective:   Physical Exam Well-developed male in no acute distress Nose without purulence or discharge noted Neck without lymphadenopathy or thyromegaly Lower extremities with no significant edema, no cyanosis The patient appears sleepy, but appropriate, moves all 4 extremities.       Assessment & Plan:

## 2013-11-17 NOTE — Assessment & Plan Note (Signed)
The patient has seen a definite improvement in his leg symptoms since being on Requip, but he does not think it has helped his sleep. He admits that he is continuing to have leg symptoms despite being on the medication. He is currently on a low dose, and will increase his Requip to 1.5 mg after dinner each night.

## 2013-12-01 ENCOUNTER — Ambulatory Visit: Payer: PRIVATE HEALTH INSURANCE | Admitting: Internal Medicine

## 2013-12-02 ENCOUNTER — Other Ambulatory Visit: Payer: Self-pay | Admitting: Neurosurgery

## 2013-12-02 DIAGNOSIS — G959 Disease of spinal cord, unspecified: Secondary | ICD-10-CM

## 2013-12-08 ENCOUNTER — Ambulatory Visit
Admission: RE | Admit: 2013-12-08 | Discharge: 2013-12-08 | Disposition: A | Discharge status: Home / Self Care | Payer: Medicare Other | Source: Ambulatory Visit | Attending: Neurosurgery | Admitting: Neurosurgery

## 2013-12-08 DIAGNOSIS — G959 Disease of spinal cord, unspecified: Secondary | ICD-10-CM

## 2013-12-13 ENCOUNTER — Ambulatory Visit: Payer: PRIVATE HEALTH INSURANCE | Admitting: Gastroenterology

## 2013-12-28 ENCOUNTER — Encounter: Payer: Self-pay | Admitting: Internal Medicine

## 2013-12-28 ENCOUNTER — Ambulatory Visit (INDEPENDENT_AMBULATORY_CARE_PROVIDER_SITE_OTHER): Payer: Medicare Other | Admitting: Internal Medicine

## 2013-12-28 ENCOUNTER — Ambulatory Visit (HOSPITAL_COMMUNITY)
Admission: RE | Admit: 2013-12-28 | Discharge: 2013-12-28 | Disposition: A | Discharge status: Home / Self Care | Payer: Medicare Other | Source: Ambulatory Visit | Attending: Oncology | Admitting: Oncology

## 2013-12-28 ENCOUNTER — Telehealth: Payer: Self-pay | Admitting: *Deleted

## 2013-12-28 VITALS — BP 131/93 | HR 90 | Temp 98.3°F | Resp 20 | Ht 69.0 in | Wt 211.7 lb

## 2013-12-28 DIAGNOSIS — K146 Glossodynia: Secondary | ICD-10-CM

## 2013-12-28 DIAGNOSIS — F1910 Other psychoactive substance abuse, uncomplicated: Secondary | ICD-10-CM

## 2013-12-28 DIAGNOSIS — I517 Cardiomegaly: Secondary | ICD-10-CM | POA: Insufficient documentation

## 2013-12-28 DIAGNOSIS — X58XXXA Exposure to other specified factors, initial encounter: Secondary | ICD-10-CM

## 2013-12-28 DIAGNOSIS — R61 Generalized hyperhidrosis: Secondary | ICD-10-CM

## 2013-12-28 DIAGNOSIS — Q383 Other congenital malformations of tongue: Secondary | ICD-10-CM

## 2013-12-28 DIAGNOSIS — B1920 Unspecified viral hepatitis C without hepatic coma: Secondary | ICD-10-CM

## 2013-12-28 DIAGNOSIS — I10 Essential (primary) hypertension: Secondary | ICD-10-CM

## 2013-12-28 DIAGNOSIS — Z87891 Personal history of nicotine dependence: Secondary | ICD-10-CM | POA: Insufficient documentation

## 2013-12-28 DIAGNOSIS — S01502A Unspecified open wound of oral cavity, initial encounter: Secondary | ICD-10-CM

## 2013-12-28 DIAGNOSIS — K219 Gastro-esophageal reflux disease without esophagitis: Secondary | ICD-10-CM

## 2013-12-28 DIAGNOSIS — J811 Chronic pulmonary edema: Secondary | ICD-10-CM | POA: Insufficient documentation

## 2013-12-28 LAB — COMPLETE METABOLIC PANEL WITH GFR
ALT: 29 U/L (ref 0–53)
AST: 34 U/L (ref 0–37)
Albumin: 4.4 g/dL (ref 3.5–5.2)
Alkaline Phosphatase: 64 U/L (ref 39–117)
BUN: 12 mg/dL (ref 6–23)
CO2: 30 mEq/L (ref 19–32)
Calcium: 9.9 mg/dL (ref 8.4–10.5)
Chloride: 101 mEq/L (ref 96–112)
Creat: 1.19 mg/dL (ref 0.50–1.35)
GFR, Est African American: 81 mL/min
GFR, Est Non African American: 70 mL/min
Glucose, Bld: 95 mg/dL (ref 70–99)
Potassium: 4.6 mEq/L (ref 3.5–5.3)
Sodium: 139 mEq/L (ref 135–145)
Total Bilirubin: 0.9 mg/dL (ref 0.2–1.2)
Total Protein: 6.9 g/dL (ref 6.0–8.3)

## 2013-12-28 LAB — CBC WITH DIFFERENTIAL/PLATELET
Basophils Absolute: 0 10*3/uL (ref 0.0–0.1)
Basophils Relative: 0 % (ref 0–1)
Eosinophils Absolute: 0.1 10*3/uL (ref 0.0–0.7)
Eosinophils Relative: 1 % (ref 0–5)
HCT: 55.4 % — ABNORMAL HIGH (ref 39.0–52.0)
Hemoglobin: 18.2 g/dL — ABNORMAL HIGH (ref 13.0–17.0)
Lymphocytes Relative: 38 % (ref 12–46)
Lymphs Abs: 3 10*3/uL (ref 0.7–4.0)
MCH: 25.3 pg — ABNORMAL LOW (ref 26.0–34.0)
MCHC: 32.9 g/dL (ref 30.0–36.0)
MCV: 77.1 fL — ABNORMAL LOW (ref 78.0–100.0)
Monocytes Absolute: 0.6 10*3/uL (ref 0.1–1.0)
Monocytes Relative: 8 % (ref 3–12)
Neutro Abs: 4.2 10*3/uL (ref 1.7–7.7)
Neutrophils Relative %: 53 % (ref 43–77)
Platelets: 180 10*3/uL (ref 150–400)
RBC: 7.19 MIL/uL — ABNORMAL HIGH (ref 4.22–5.81)
RDW: 18.1 % — ABNORMAL HIGH (ref 11.5–15.5)
WBC: 7.9 10*3/uL (ref 4.0–10.5)

## 2013-12-28 LAB — VITAMIN B12: Vitamin B-12: 594 pg/mL (ref 211–911)

## 2013-12-28 MED ORDER — PANTOPRAZOLE SODIUM 40 MG PO TBEC: 40.0000 mg | DELAYED_RELEASE_TABLET | Freq: Every day | ORAL | Status: DC

## 2013-12-28 MED ORDER — MAGIC MOUTHWASH: 5.0000 mL | Freq: Three times a day (TID) | ORAL | Status: DC | PRN

## 2013-12-28 NOTE — Telephone Encounter (Signed)
Anderson Malta can u handle this please?

## 2013-12-28 NOTE — Patient Instructions (Signed)
General Instructions:  You can try the magic mouthwash up to three times a day as needed swish in mouth  Please follow up with your pcp in regards to further referral for hepatitis clinic  If you start having fever, chills, let your pcp know  Please get your cxr done  Please stop smoking, consider calling 1800quitnow for further assistance as well  Please bring your medicines with you each time you come to clinic.  Medicines may include prescription medications, over-the-counter medications, herbal remedies, eye drops, vitamins, or other pills.   Progress Toward Treatment Goals:  Treatment Goal 12/28/2013  Blood pressure at goal  Stop smoking -    Self Care Goals & Plans:  Self Care Goal 05/26/2013  Manage my medications take my medicines as prescribed; bring my medications to every visit  Monitor my health -  Eat healthy foods -  Be physically active -    No flowsheet data found.   Care Management & Community Referrals:  Referral 05/26/2013  Referrals made for care management support none needed    Treatment Goals:  Goals (1 Years of Data) as of 12/28/13   None      Progress Toward Treatment Goals:  Treatment Goal 12/28/2013  Blood pressure at goal  Stop smoking -    Self Care Goals & Plans:  Self Care Goal 05/26/2013  Manage my medications take my medicines as prescribed; bring my medications to every visit  Monitor my health -  Eat healthy foods -  Be physically active -    No flowsheet data found.   Care Management & Community Referrals:  Referral 05/26/2013  Referrals made for care management support none needed

## 2013-12-28 NOTE — Telephone Encounter (Signed)
[facility] - needs clarification for magic mouthwash; needs to know exactly what u what in it (combination)? Thanks [phone]

## 2013-12-28 NOTE — Assessment & Plan Note (Addendum)
Ab positive but noted to have neg RNA titers but cannot seem to find those records on epic.   -will check viral load today -may need hep c clinic referral, defer to pcp  Addendum 12/30/13: Viral load undetectable, and located records since 2010 that were similar. Therefore, he was exposed in the past but has cleared the infection on his own. I have updated the patient today over the phone with his results

## 2013-12-28 NOTE — Assessment & Plan Note (Deleted)
Continues to smoke up to 8 cigs/week. Counseled on cessation

## 2013-12-28 NOTE — Assessment & Plan Note (Signed)
BP Readings from Last 3 Encounters:  12/28/13 131/93  11/17/13 128/84  10/05/13 148/102   Lab Results  Component Value Date   NA 133* 04/06/2013   K 3.9 04/06/2013   CREATININE 0.91 04/06/2013    Assessment: Blood pressure control: controlled Progress toward BP goal:  at goal  Plan: Medications:  continue current medications hctz 25mg , cozaar 50mg 

## 2013-12-28 NOTE — Assessment & Plan Note (Signed)
x1 month, from head to lower neck. Denies fever or chills. Noted to have hep c ab reactive. Appears non-specific but he is a smoker. Does not appear to have diabetes, bp relatively well controlled.    -will check cxr -cbc, cmet, hep c viral load -continue to monitor for now -has not been taking requip (could be a side-effect of this medication, however he says he has not been taking it)

## 2013-12-28 NOTE — Progress Notes (Signed)
Subjective:   Patient ID: Jerome Adams male   DOB: [birth year] 53 y.o.   MRN: [id number]  HPI: [name] is a 53 y.o. African American male with mild OSA, RLS (on requip), Hep C (Ab reactive), HTN, chronic back pain, insomnia, and depression presenting to [facility] today for acute visit with complaints of night sweats x1 month, increased sweating during the day as well, and cuts on his tongue x3 days.  He says the sweating is noted from his head to his lower neck, more than usual. But denies fever, chills, or weight loss.  He does smoke up to 8cigs/per week, denies alcohol or illicit drug use but does have distant hx of cocaine use.  He is hep c ab reactive but apparently has had neg rna titers, although I cannot see those results on epic. Will check viral load today. He has not been taking his requip, continues to get weekly testosterone injections.   Finally, his other complaints is noticing cuts on his tongue x3 days with increased burning when using mouthwash and change in taste. He denies any change in diet or medications.   Of note, he also sees Dr. Pattricia Boss and will meet with his new opc pcp later this month to decide which pcp office he will continue to follow up with. He gets his pain medication from outside provider.   Past Medical History  Diagnosis Date  . Back pain   . PUD (peptic ulcer disease)   . Hypertension   . Allergy   . GERD (gastroesophageal reflux disease)     takes  otc  . Spinal cord stimulator status     has had for 1 yr--inserted by [facility]  . Sleep apnea     was tested here at [facility].Marland KitchenMarland KitchenMarland KitchenNever heard anymore about it (03/30/2013)  . Arthritis     "joints" (03/30/2013)  . Chronic lower back pain   . Depression   . Hepatitis C     "tested for it; never told I had it" (03/30/2013)  . Pancreatitis, acute 03/30/2013   Current Outpatient Prescriptions  Medication Sig Dispense Refill  . diazepam (VALIUM) 5 MG tablet Take 5 mg by mouth every 12 (twelve) hours as  needed for anxiety.       [name] gemfibrozil (LOPID) 600 MG tablet Take 1 tablet (600 mg total) by mouth 2 (two) times daily before a meal.  60 tablet  5  . hydrochlorothiazide (HYDRODIURIL) 25 MG tablet TAKE 1 TABLET (25 MG TOTAL) BY MOUTH DAILY.  30 tablet  0  . losartan (COZAAR) 50 MG tablet TAKE 1 TABLET (50 MG TOTAL) BY MOUTH DAILY.  30 tablet  0  . lovastatin (MEVACOR) 40 MG tablet Take 1 tablet (40 mg total) by mouth daily.  30 tablet  2  . mometasone (NASONEX) 50 MCG/ACT nasal spray Place 2 sprays into the nose daily as needed.  17 g  2  . omega-3 acid ethyl esters (LOVAZA) 1 G capsule Take 1 capsule (1 g total) by mouth 2 (two) times daily.  60 capsule  5  . Oxycodone HCl 20 MG TABS Take 1 tablet by mouth every 4 (four) hours as needed.      . pantoprazole (PROTONIX) 40 MG tablet Take 1 tablet (40 mg total) by mouth daily.  90 tablet  1  . rOPINIRole (REQUIP) 0.5 MG tablet Take 1 tab qd after dinner x 1 week, then increase to 2 tabs daily after dinner.  60 tablet  0  . sertraline (ZOLOFT) 50 MG tablet TAKE 1 TABLET (50 MG TOTAL) BY MOUTH DAILY.  30 tablet  0  . testosterone cypionate (DEPOTESTOTERONE CYPIONATE) 200 MG/ML injection Once per week       No current facility-administered medications for this visit.   Family History  Problem Relation Age of Onset  . Heart disease Father   . Heart disease Sister   . Breast cancer Sister   . Bone cancer Mother    History   Social History  . Marital Status: Divorced    Spouse Name: N/A    Number of Children: N/A  . Years of Education: N/A   Occupational History  . UNEMPLOYEED     on disability.    Social History Main Topics  . Smoking status: Current Some Day Smoker -- 0.40 packs/day for 30 years    Types: Cigarettes    Last Attempt to Quit: 03/23/2010  . Smokeless tobacco: Never Used     Comment: 8 cig per week  . Alcohol Use: No     Comment: 1-2 beers/week  . Drug Use: No     Comment: 03/30/2013 "[place]"  . Sexual  Activity: Not on file   Other Topics Concern  . Not on file   Social History Narrative  . No narrative on file   Review of Systems:  Constitutional:  Night sweats, increased diaphoresis. Denies fever  HEENT:  Cuts on tongue  Respiratory:  Occasional SOB  Cardiovascular:  Denies chest pain and leg swelling.   Gastrointestinal:  Denies nausea, vomiting. Occasional abdominal pain hx of pancreatitis  Genitourinary:  Denies dysuria  Musculoskeletal:  Chronic back pain  Skin:  Dry skin on back  Neurological:  Occasional headaches    Objective:  Physical Exam: Filed Vitals:   12/28/13 1517  BP: 131/93  Pulse: 90  Temp: 98.3 F (36.8 C)  TempSrc: Oral  Resp: 20  Height: 5\' 9"  (1.753 m)  Weight: 211 lb 11.2 oz (96.026 kg)  SpO2: 97%   Vitals reviewed. General: sitting on examination table, NAD HEENT: EOMI, +conjunctival injection b/l eyes Cardiac: RRR, no rubs, murmurs or gallops Pulm: clear to auscultation bilaterally, no wheezes, rales, or rhonchi Abd: soft, nontender, BS present Ext: warm and well perfused, no pedal edema, moving all 4 extremities Neuro: alert and oriented X3,  strength and sensation to light touch equal in bilateral upper and lower extremities  Assessment & Plan:  Discussed with Dr. Beryle Beams Cbc, cmet, hcv viral load, vit b12 Magic mouthwash prn for tongue

## 2013-12-28 NOTE — Progress Notes (Signed)
Attending physician note: I examined this patient together with resident physician Dr. Eula Fried and we discussed the management plan. I am not impressed that there is any major pathology on his tongue. His unusual pattern of swelling in the head and bib area also does not appear to be pathologic. No fevers. No lymphadenopathy on exam. Plan as outlined above in the resident physician's note.

## 2013-12-28 NOTE — Assessment & Plan Note (Signed)
Refilled ppi has not taken since running out

## 2013-12-28 NOTE — Assessment & Plan Note (Signed)
x3 days, noticed increased "cuts" on tongue and increased burning when using mouthwash and change in taste.  No visible thrush. Does not appear dry, when he pulls his tongue with fingers, slits seen, tongue is red, no obvious ulcers. Unknown etiology.   -will try magic mouthwash tid prn for now -check vitamin b12 level, also checking routine labs

## 2013-12-29 ENCOUNTER — Telehealth: Payer: Self-pay | Admitting: Internal Medicine

## 2013-12-29 ENCOUNTER — Other Ambulatory Visit: Payer: Self-pay | Admitting: Internal Medicine

## 2013-12-29 DIAGNOSIS — R61 Generalized hyperhidrosis: Secondary | ICD-10-CM

## 2013-12-29 DIAGNOSIS — D751 Secondary polycythemia: Secondary | ICD-10-CM

## 2013-12-29 LAB — HEPATITIS C RNA QUANTITATIVE: HCV Quantitative: NOT DETECTED IU/mL (ref <15)

## 2013-12-29 NOTE — Telephone Encounter (Signed)
I called Jerome Adams to review results of recent lab tests an to also let him know to return next week to have further labs drawn and then keep follow up appt with pcp on the 18th. If he can fall back, I will ask triage to relay this message to him as well.

## 2013-12-29 NOTE — Telephone Encounter (Signed)
Anderson Malta not here today. Dr Eula Fried states Nystatin, Benadryl and Lidocaine - [facility].

## 2013-12-29 NOTE — Telephone Encounter (Signed)
Clarification- Nystatin, Benadryl, and Hydrocortisone per Dr Eula Fried; called [facility].

## 2013-12-30 ENCOUNTER — Telehealth: Payer: Self-pay | Admitting: Internal Medicine

## 2013-12-30 NOTE — Telephone Encounter (Signed)
I have called Jerome Adams and spoken to him this afternoon in regards to his recent lab results. We reviewed his Hepatitis C Quant results in detail indicating that he was previously exposed but has cleared the infection on his own. HCV Quant not detected.   Also, we reviewed his cbc and polycythemia and need to repeat labs next week and follow up with pcp which he will do.   He voiced understanding of results and our conversation.

## 2013-12-30 NOTE — Progress Notes (Signed)
Pt has an appt 01/03/14 for labs.

## 2014-01-03 ENCOUNTER — Other Ambulatory Visit (INDEPENDENT_AMBULATORY_CARE_PROVIDER_SITE_OTHER): Payer: Medicare Other

## 2014-01-03 DIAGNOSIS — R61 Generalized hyperhidrosis: Secondary | ICD-10-CM

## 2014-01-03 DIAGNOSIS — D45 Polycythemia vera: Secondary | ICD-10-CM

## 2014-01-03 DIAGNOSIS — D751 Secondary polycythemia: Secondary | ICD-10-CM

## 2014-01-04 LAB — CBC WITH DIFFERENTIAL/PLATELET
Basophils Absolute: 0 10*3/uL (ref 0.0–0.1)
Basophils Relative: 0 % (ref 0–1)
Eosinophils Absolute: 0.1 10*3/uL (ref 0.0–0.7)
Eosinophils Relative: 1 % (ref 0–5)
HCT: 54.3 % — ABNORMAL HIGH (ref 39.0–52.0)
Hemoglobin: 18 g/dL — ABNORMAL HIGH (ref 13.0–17.0)
Lymphocytes Relative: 41 % (ref 12–46)
Lymphs Abs: 2.6 10*3/uL (ref 0.7–4.0)
MCH: 25.5 pg — ABNORMAL LOW (ref 26.0–34.0)
MCHC: 33.1 g/dL (ref 30.0–36.0)
MCV: 76.8 fL — ABNORMAL LOW (ref 78.0–100.0)
Monocytes Absolute: 0.8 10*3/uL (ref 0.1–1.0)
Monocytes Relative: 12 % (ref 3–12)
Neutro Abs: 2.9 10*3/uL (ref 1.7–7.7)
Neutrophils Relative %: 46 % (ref 43–77)
Platelets: 170 10*3/uL (ref 150–400)
RBC: 7.07 MIL/uL — ABNORMAL HIGH (ref 4.22–5.81)
RDW: 17.4 % — ABNORMAL HIGH (ref 11.5–15.5)
WBC: 6.4 10*3/uL (ref 4.0–10.5)

## 2014-01-05 LAB — ERYTHROPOIETIN: Erythropoietin: 20.2 m[IU]/mL — ABNORMAL HIGH (ref 2.6–18.5)

## 2014-01-09 LAB — JAK2 GENOTYPR: JAK2 GenotypR: NOT DETECTED

## 2014-01-12 ENCOUNTER — Encounter: Payer: Medicare Other | Admitting: Internal Medicine

## 2014-01-12 ENCOUNTER — Encounter: Payer: Self-pay | Admitting: Internal Medicine

## 2014-01-16 ENCOUNTER — Ambulatory Visit (INDEPENDENT_AMBULATORY_CARE_PROVIDER_SITE_OTHER): Payer: Medicare Other | Admitting: Internal Medicine

## 2014-01-16 VITALS — BP 126/85 | HR 63 | Temp 91.6°F | Ht 69.0 in | Wt 201.9 lb

## 2014-01-16 DIAGNOSIS — H9311 Tinnitus, right ear: Secondary | ICD-10-CM

## 2014-01-16 DIAGNOSIS — M5106 Intervertebral disc disorders with myelopathy, lumbar region: Secondary | ICD-10-CM

## 2014-01-16 DIAGNOSIS — H9319 Tinnitus, unspecified ear: Secondary | ICD-10-CM

## 2014-01-16 MED ORDER — DIPHENHYDRAMINE HCL 25 MG PO CAPS: 25.0000 mg | ORAL_CAPSULE | ORAL | Status: DC | PRN

## 2014-01-16 NOTE — Assessment & Plan Note (Signed)
Per patient, he has been receiving Oxycodone 20 mg from Dr. Pattricia Boss over the last 2-3 years. He does not give good enough reasons why he is seeing Dr. Pattricia Boss for the pain management as we never referred him to Dr. Pattricia Boss. Patient states that he wants to follow with [facility] for the management of his pain control. Discussed with the attending Dr. Dareen Piano who is also the new assigned PCP.  Plans: Will switch to long acting scheduled opiate such as MS Contin with fewer shorter acting opiate for breakthrough pain. Will switch to this plan at the next office visit with the PCP upon signing pain contract. Patient is agreeable to this plan.

## 2014-01-16 NOTE — Patient Instructions (Signed)
Use Benadryl as recommended.  Tinnitus Sounds you hear in your ears and coming from within the ear is called tinnitus. This can be a symptom of many ear disorders. It is often associated with hearing loss.  Tinnitus can be seen with:  Infections.  Ear blockages such as wax buildup.  Meniere's disease.  Ear damage.  Inherited.  Occupational causes. While irritating, it is not usually a threat to health. When the cause of the tinnitus is wax, infection in the middle ear, or foreign body it is easily treated. Hearing loss will usually be reversible.  TREATMENT  When treating the underlying cause does not get rid of tinnitus, it may be necessary to get rid of the unwanted sound by covering it up with more pleasant background noises. This may include music, the radio etc. There are tinnitus maskers which can be worn which produce background noise to cover up the tinnitus. Avoid all medications which tend to make tinnitus worse such as alcohol, caffeine, aspirin, and nicotine. There are many soothing background tapes such as rain, ocean, thunderstorms, etc. These soothing sounds help with sleeping or resting. Keep all follow-up appointments and referrals. This is important to identify the cause of the problem. It also helps avoid complications, impaired hearing, disability, or chronic pain.  Document Released: 07/14/2005 Document Revised: 10/06/2011 Document Reviewed: 03/01/2008 [employer] Patient Information 2015 [place], [place]. This information is not intended to replace advice given to you by your health care provider. Make sure you discuss any questions you have with your health care provider.

## 2014-01-16 NOTE — Progress Notes (Signed)
Subjective:   Patient ID: Jerome Adams male   DOB: [birth year] 53 y.o.   MRN: [id number]  HPI: Jerome Adams is a 53 y.o. 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  Patient reports that he has been hearing buzzing in his right ear for the last 2 days. At first he thought, there was some water in the ear and used q-tip. But the buzzing persisted and now he wonders if there is a bug in his right ear. He denies any pain in the right ear, difficulty hearing, vertigo, runny nose, sore throat, fever, chills, headaches. He denies any similar symptoms in the past.  Patient reports that he has been seeing Dr. Pattricia Boss for pain management for the last 2-3 years and currently has been receiving Oxycodone 20 mg q4prn #120 every month. He reports that he has an appointment with Dr. Pattricia Boss on 01/25/14 and that will be the last appointment with him as he plans to change his primary care to [facility].  He denies any other complaints during this office visit.   Past Medical History  Diagnosis Date  . Back pain   . PUD (peptic ulcer disease)   . Hypertension   . Allergy   . GERD (gastroesophageal reflux disease)     takes  otc  . Spinal cord stimulator status     has had for 1 yr--inserted by [facility]  . Sleep apnea     was tested here at [facility].Marland KitchenMarland KitchenMarland KitchenNever heard anymore about it (03/30/2013)  . Arthritis     "joints" (03/30/2013)  . Chronic lower back pain   . Depression   . Hepatitis C     "tested for it; never told I had it" (03/30/2013)  . Pancreatitis, acute 03/30/2013   Current Outpatient Prescriptions  Medication Sig Dispense Refill  . Alum & Mag Hydroxide-Simeth (MAGIC MOUTHWASH) SOLN Take 5 mLs by mouth 3 (three) times daily as needed for mouth pain.  10 mL  0  . diazepam (VALIUM) 5 MG tablet Take 5 mg by mouth every 12 (twelve) hours as needed for  anxiety.       . diclofenac (VOLTAREN) 75 MG EC tablet       . diphenhydrAMINE (BENADRYL) 25 mg capsule Take 1 capsule (25 mg total) by mouth every 4 (four) hours as needed.  30 capsule  0  . gemfibrozil (LOPID) 600 MG tablet Take 1 tablet (600 mg total) by mouth 2 (two) times daily before a meal.  60 tablet  5  . hydrochlorothiazide (HYDRODIURIL) 25 MG tablet TAKE 1 TABLET (25 MG TOTAL) BY MOUTH DAILY.  30 tablet  0  . losartan (COZAAR) 50 MG tablet TAKE 1 TABLET (50 MG TOTAL) BY MOUTH DAILY.  30 tablet  0  . lovastatin (MEVACOR) 40 MG tablet Take 1 tablet (40 mg total) by mouth daily.  30 tablet  2  . mometasone (NASONEX) 50 MCG/ACT nasal spray Place 2 sprays into the nose daily as needed.  17 g  2  . omega-3 acid ethyl esters (LOVAZA) 1 G capsule Take 1 capsule (1 g total) by mouth 2 (two) times daily.  60 capsule  5  . Oxycodone HCl 20 MG TABS Take 1 tablet by mouth every 4 (four) hours as needed.      . pantoprazole (PROTONIX) 40 MG tablet Take 1  tablet (40 mg total) by mouth daily.  90 tablet  1  . rOPINIRole (REQUIP) 0.5 MG tablet Take 1 tab qd after dinner x 1 week, then increase to 2 tabs daily after dinner.  60 tablet  0  . sertraline (ZOLOFT) 50 MG tablet TAKE 1 TABLET (50 MG TOTAL) BY MOUTH DAILY.  30 tablet  0  . testosterone cypionate (DEPOTESTOTERONE CYPIONATE) 200 MG/ML injection Once per week       No current facility-administered medications for this visit.   Family History  Problem Relation Age of Onset  . Heart disease Father   . Heart disease Sister   . Breast cancer Sister   . Bone cancer Mother    History   Social History  . Marital Status: Divorced    Spouse Name: N/A    Number of Children: N/A  . Years of Education: N/A   Occupational History  . UNEMPLOYEED     on disability.    Social History Main Topics  . Smoking status: Current Some Day Smoker -- 0.40 packs/day for 30 years    Types: Cigarettes    Last Attempt to Quit: 03/23/2010  . Smokeless  tobacco: Never Used     Comment: 8 cig per week  . Alcohol Use: No     Comment: 1-2 beers/week. quit several months ago  . Drug Use: No     Comment: 03/30/2013 "[place]"  . Sexual Activity: Not on file   Other Topics Concern  . Not on file   Social History Narrative  . No narrative on file   Review of Systems: Pertinent items are noted in HPI. Objective:  Physical Exam: Filed Vitals:   01/16/14 1441  BP: 126/85  Pulse: 63  Temp: 91.6 F (33.1 C)  TempSrc: Oral  Height: 5\' 9"  (1.753 m)  Weight: 201 lb 14.4 oz (91.581 kg)  SpO2: 98%   Vitals reviewed.  General: Age appropriate looking, not in any acute distress. Ear: No cerumen noted in both ears. TM is visible with no fluid noted in the middle ear. No visible insects noticed in the external ear canal. Cardiac: RRR, no rubs, murmurs or gallops  Pulm: clear to auscultation bilaterally, no wheezes, rales, or rhonchi  Ext: no pedal edema Neuro: alert and oriented X3  Assessment & Plan:

## 2014-01-16 NOTE — Assessment & Plan Note (Signed)
Unclear etiology. No other symptoms such as vertigo, dizziness, hearing deficit etc. His current medications such as BZD's, Zoloft, PPI could cause/execerbate Tinnitus even though he has been on them for quite sometime now. Given absence of other symptoms currently, will treat conservatively.  Plans: Benadryl 25 mg q6h for a week to see if it helps with his symptoms. Continue all the other medications for now Consider further workup or ENT referral if symptoms persist or worsen or develop new symptoms.

## 2014-01-17 NOTE — Progress Notes (Signed)
INTERNAL MEDICINE TEACHING ATTENDING ADDENDUM - Iysis Germain, MD: I reviewed and discussed with the resident Dr. Boggala, the patient's medical history, physical examination, diagnosis and results of pertinent tests and treatment and I agree with the patient's care as documented. 

## 2014-02-02 ENCOUNTER — Ambulatory Visit: Payer: Medicare Other | Admitting: Internal Medicine

## 2014-02-20 ENCOUNTER — Ambulatory Visit (INDEPENDENT_AMBULATORY_CARE_PROVIDER_SITE_OTHER): Payer: Medicare Other | Admitting: Gastroenterology

## 2014-02-20 ENCOUNTER — Encounter: Payer: Self-pay | Admitting: Gastroenterology

## 2014-02-20 VITALS — BP 130/100 | HR 72 | Ht 70.0 in | Wt 205.4 lb

## 2014-02-20 DIAGNOSIS — R1314 Dysphagia, pharyngoesophageal phase: Secondary | ICD-10-CM

## 2014-02-20 NOTE — Progress Notes (Signed)
Review of pertinent gastrointestinal problems: 1. Adenomatous colon polyps; Colonoscopy 10/2011 Ardis Hughs for routine risk screening found 2 subcentimeter polyps, one was a TA; recommended repeat colonoscopy at 5 year interval   HPI: This is a   pleasant 53 year old man whom I last saw about 2 years ago the time of colonoscopy. He is here today for a new problem.  When he swallows  Since spinal surgery 1-2 years he has had dysphagia to liquids and solids.  Feels that is occurs daily but not every swallow; pretty vague about this.  Takes zantac periodically and it usually helps.  Can have a burning in epigastrium as well.  Overall stable weight.  No abx recently.  Has been having years of difficulty with his stomach.    Review of systems: Pertinent positive and negative review of systems were noted in the above HPI section. Complete review of systems was performed and was otherwise normal.    Past Medical History  Diagnosis Date  . Back pain   . PUD (peptic ulcer disease)   . Hypertension   . Allergy   . GERD (gastroesophageal reflux disease)     takes  otc  . Spinal cord stimulator status     has had for 1 yr--inserted by [facility]  . Sleep apnea     was tested here at [facility].Marland KitchenMarland KitchenMarland KitchenNever heard anymore about it (03/30/2013)  . Arthritis     "joints" (03/30/2013)  . Chronic lower back pain   . Depression   . Hepatitis C     "tested for it; never told I had it" (03/30/2013)  . Pancreatitis, acute 03/30/2013    Past Surgical History  Procedure Laterality Date  . Back surgery    . Wrist surgery Right 1980's    repair of tendons and nerves  . Foot surgery Right 2012?    "took piece off that was hurting me" (03/30/2013)  . Spinal cord stimulator implant  11/14/2012  . Anterior cervical decomp/discectomy fusion  04/05/2012    Procedure: ANTERIOR CERVICAL DECOMPRESSION/DISCECTOMY FUSION 1 LEVEL;  Surgeon: Elaina Hoops, MD;  Location: [place] [facility];  Service: Neurosurgery;  Laterality: N/A;   Cervical five-six anterior cervical decompression and fusion  . Posterior lumbar fusion      "L4-5" (03/31/2103)  . Hardware removal      "took screw out of my back; it had damaged my nerve" (03/30/2013)  . Incision and drainage abscess Left     "lower jaw, tooth abscess" (03/30/2013)    Current Outpatient Prescriptions  Medication Sig Dispense Refill  . diazepam (VALIUM) 5 MG tablet Take 5 mg by mouth every 12 (twelve) hours as needed for anxiety.       . diclofenac (VOLTAREN) 75 MG EC tablet       . diphenhydrAMINE (BENADRYL) 25 mg capsule Take 1 capsule (25 mg total) by mouth every 4 (four) hours as needed.  30 capsule  0  . gemfibrozil (LOPID) 600 MG tablet Take 1 tablet (600 mg total) by mouth 2 (two) times daily before a meal.  60 tablet  5  . hydrochlorothiazide (HYDRODIURIL) 25 MG tablet TAKE 1 TABLET (25 MG TOTAL) BY MOUTH DAILY.  30 tablet  0  . losartan (COZAAR) 50 MG tablet TAKE 1 TABLET (50 MG TOTAL) BY MOUTH DAILY.  30 tablet  0  . lovastatin (MEVACOR) 40 MG tablet Take 1 tablet (40 mg total) by mouth daily.  30 tablet  2  . omega-3 acid ethyl esters (LOVAZA) 1  G capsule Take 1 capsule (1 g total) by mouth 2 (two) times daily.  60 capsule  5  . Oxycodone HCl 20 MG TABS Take 1 tablet by mouth every 4 (four) hours as needed.      [name] rOPINIRole (REQUIP) 0.5 MG tablet Take 1 tab qd after dinner x 1 week, then increase to 2 tabs daily after dinner.  60 tablet  0  . sertraline (ZOLOFT) 50 MG tablet TAKE 1 TABLET (50 MG TOTAL) BY MOUTH DAILY.  30 tablet  0  . testosterone cypionate (DEPOTESTOTERONE CYPIONATE) 200 MG/ML injection Once per week       No current facility-administered medications for this visit.    Allergies as of 02/20/2014 - Review Complete 02/20/2014  Allergen Reaction Noted  . Hydrocodone-acetaminophen Itching     Family History  Problem Relation Age of Onset  . Heart disease Father   . Heart disease Sister   . Breast cancer Sister   . Bone cancer Mother      History   Social History  . Marital Status: Divorced    Spouse Name: N/A    Number of Children: 1  . Years of Education: N/A   Occupational History  . UNEMPLOYEED     on disability.    Social History Main Topics  . Smoking status: Current Some Day Smoker -- 0.40 packs/day for 30 years    Types: Cigarettes  . Smokeless tobacco: Never Used     Comment: 8 cig per week  . Alcohol Use: No     Comment: 1-2 beers/week. quit several months ago  . Drug Use: No     Comment: 03/30/2013 "[place]"  . Sexual Activity: Not on file   Other Topics Concern  . Not on file   Social History Narrative  . No narrative on file       Physical Exam: Ht 5\' 10"  (1.778 m)  Wt 205 lb 6 oz (93.157 kg)  BMI 29.47 kg/m2 Constitutional: generally well-appearing Psychiatric: alert and oriented x3 Eyes: extraocular movements intact Mouth: oral pharynx moist, no lesions Neck: supple no lymphadenopathy Cardiovascular: heart regular rate and rhythm Lungs: clear to auscultation bilaterally Abdomen: soft, nontender, nondistended, no obvious ascites, no peritoneal signs, normal bowel sounds Extremities: no lower extremity edema bilaterally Skin: no lesions on visible extremities    Assessment and plan: 53 y.o. male with  swallowing difficulty since neck, spinal surgery a year to 2 ago.  He is quite vague about his overall symptoms But I do think he is having trouble swallowing with dysphagia-like symptoms. GERD may be playing a role and I recommended he start proton pump inhibitor 20-30 minutes before mealtime once daily. We'll proceed with EGD at his soonest convenience to check for strictures, stenosis.

## 2014-02-20 NOTE — Patient Instructions (Signed)
Start omeprazole (OTC), one pill once daily 20-30 minutes before a meal. You will be set up for an upper endoscopy for dysphagia.

## 2014-02-27 ENCOUNTER — Encounter: Payer: Self-pay | Admitting: Gastroenterology

## 2014-02-27 ENCOUNTER — Ambulatory Visit (AMBULATORY_SURGERY_CENTER): Payer: Medicare Other | Admitting: Gastroenterology

## 2014-02-27 VITALS — BP 172/79 | HR 74 | Temp 97.2°F | Resp 44 | Ht 70.0 in | Wt 206.0 lb

## 2014-02-27 DIAGNOSIS — K2990 Gastroduodenitis, unspecified, without bleeding: Secondary | ICD-10-CM

## 2014-02-27 DIAGNOSIS — R933 Abnormal findings on diagnostic imaging of other parts of digestive tract: Secondary | ICD-10-CM

## 2014-02-27 DIAGNOSIS — R1314 Dysphagia, pharyngoesophageal phase: Secondary | ICD-10-CM

## 2014-02-27 DIAGNOSIS — K2900 Acute gastritis without bleeding: Secondary | ICD-10-CM

## 2014-02-27 DIAGNOSIS — K2970 Gastritis, unspecified, without bleeding: Secondary | ICD-10-CM

## 2014-02-27 MED ORDER — SODIUM CHLORIDE 0.9 % IV SOLN: 500.0000 mL | INTRAVENOUS | Status: DC

## 2014-02-27 NOTE — Op Note (Signed)
[facility]  [address] [place], [id number]   ENDOSCOPY PROCEDURE REPORT  PATIENT: Jerome Adams, Jerome Adams  MR#: [id number] BIRTHDATE: 03/20/1961 , 52  yrs. old GENDER: Male ENDOSCOPIST: Milus Banister, MD PROCEDURE DATE:  02/27/2014 PROCEDURE:  EGD w/ biopsy ASA CLASS:     Class III INDICATIONS:  dysphagia since neck surgery about 2 years ago. MEDICATIONS: MAC sedation, administered by CRNA and propofol (Diprivan) 200mg  IV TOPICAL ANESTHETIC: none  DESCRIPTION OF PROCEDURE: After the risks benefits and alternatives of the procedure were thoroughly explained, informed consent was obtained.  The LB [id number] endoscope was introduced through the mouth and advanced to the second portion of the duodenum. Without limitations.  The instrument was slowly withdrawn as the mucosa was fully examined.   There was moderate, non-specific pan gastritis.  The antrum was biopsied and sent to pathology.  There was a small, soft pyloric channel nodule that was biopsied.  The examination was otherwise normal.  Retroflexed views revealed no abnormalities.     The scope was then withdrawn from the patient and the procedure completed.  COMPLICATIONS: There were no complications. ENDOSCOPIC IMPRESSION: There was moderate, non-specific pan gastritis.  The antrum was biopsied and sent to pathology.  There was a small, soft pyloric channel nodule that was biopsied.  The examination was otherwise normal.  RECOMMENDATIONS: Await final biopsy results.  As recommended in office last week, please start once daily OTC omeprazole.  This is best taken 20-30 minutes prior to breakfast meal.   eSigned:  Milus Banister, MD 02/27/2014 9:35 AM   CC: Antonietta Jewel, MD

## 2014-02-27 NOTE — Patient Instructions (Addendum)
One of your biggest health concerns is your smoking.  This increases your risk for most cancers and serious cardiovascular diseases such as strokes, heart attacks.  You should try your best to stop.  If you need assistance, please contact your PCP or Smoking Cessation Class at [facility] [phone]) or [place] (1-800-QUIT-NOW).  YOU HAD AN ENDOSCOPIC PROCEDURE TODAY AT [facility]: Refer to the procedure report that was given to you for any specific questions about what was found during the examination.  If the procedure report does not answer your questions, please call your gastroenterologist to clarify.  If you requested that your care partner not be given the details of your procedure findings, then the procedure report has been included in a sealed envelope for you to review at your convenience later.  YOU SHOULD EXPECT: Some feelings of bloating in the abdomen. Passage of more gas than usual.  Walking can help get rid of the air that was put into your GI tract during the procedure and reduce the bloating.  DIET: Your first meal following the procedure should be a light meal and then it is ok to progress to your normal diet.  A half-sandwich or bowl of soup is an example of a good first meal.  Heavy or fried foods are harder to digest and may make you feel nauseous or bloated.  Likewise meals heavy in dairy and vegetables can cause extra gas to form and this can also increase the bloating.  Drink plenty of fluids but you should avoid alcoholic beverages for 24 hours.  ACTIVITY: Your care partner should take you home directly after the procedure.  You should plan to take it easy, moving slowly for the rest of the day.  You can resume normal activity the day after the procedure however you should NOT DRIVE or use heavy machinery for 24 hours (because of the sedation medicines used during the test).    SYMPTOMS TO REPORT IMMEDIATELY: A gastroenterologist can be  reached at any hour.  During normal business hours, 8:30 AM to 5:00 PM Monday through Friday, call [phone].  After hours and on weekends, please call the GI answering service at [phone] who will take a message and have the physician on call contact you.  Following upper endoscopy (EGD)  Vomiting of blood or coffee ground material  New chest pain or pain under the shoulder blades  Painful or persistently difficult swallowing  New shortness of breath  Fever of 100F or higher  Black, tarry-looking stools  FOLLOW UP: If any biopsies were taken you will be contacted by phone or by letter within the next 1-3 weeks.  Call your gastroenterologist if you have not heard about the biopsies in 3 weeks.  Our staff will call the home number listed on your records the next business day following your procedure to check on you and address any questions or concerns that you may have at that time regarding the information given to you following your procedure. This is a courtesy call and so if there is no answer at the home number and we have not heard from you through the emergency physician on call, we will assume that you have returned to your regular daily activities without incident.  SIGNATURES/CONFIDENTIALITY: You and/or your care partner have signed paperwork which will be entered into your electronic medical record.  These signatures attest to the fact that that the information above on your After Visit Summary has been reviewed  and is understood.  Full responsibility of the confidentiality of this discharge information lies with you and/or your care-partner.  Await pathology  Continue your normal medications- please start daily over the counter Omeprazole 1 tablet daily.  It is best to take this 20 to 30 minutes before your first meal of the day  Please read handout about gastritis

## 2014-02-27 NOTE — Progress Notes (Signed)
Procedure ends, to recovery, report given and VSS. 

## 2014-02-27 NOTE — Progress Notes (Signed)
Called to room to assist during endoscopic procedure.  Patient ID and intended procedure confirmed with present staff. Received instructions for my participation in the procedure from the performing physician.  

## 2014-02-28 ENCOUNTER — Telehealth: Payer: Self-pay | Admitting: *Deleted

## 2014-02-28 NOTE — Telephone Encounter (Signed)
No answer, message left for the patient. 

## 2014-03-19 ENCOUNTER — Emergency Department (HOSPITAL_COMMUNITY)
Admission: EM | Admit: 2014-03-19 | Discharge: 2014-03-19 | Disposition: A | Discharge status: Home / Self Care | Payer: Medicare Other | Attending: Emergency Medicine | Admitting: Emergency Medicine

## 2014-03-19 ENCOUNTER — Encounter (HOSPITAL_COMMUNITY): Payer: Self-pay | Admitting: Emergency Medicine

## 2014-03-19 DIAGNOSIS — L0201 Cutaneous abscess of face: Secondary | ICD-10-CM | POA: Insufficient documentation

## 2014-03-19 DIAGNOSIS — Z9889 Other specified postprocedural states: Secondary | ICD-10-CM | POA: Diagnosis not present

## 2014-03-19 DIAGNOSIS — K047 Periapical abscess without sinus: Secondary | ICD-10-CM | POA: Diagnosis not present

## 2014-03-19 DIAGNOSIS — G8929 Other chronic pain: Secondary | ICD-10-CM | POA: Insufficient documentation

## 2014-03-19 DIAGNOSIS — Z8711 Personal history of peptic ulcer disease: Secondary | ICD-10-CM | POA: Insufficient documentation

## 2014-03-19 DIAGNOSIS — I10 Essential (primary) hypertension: Secondary | ICD-10-CM | POA: Diagnosis not present

## 2014-03-19 DIAGNOSIS — F3289 Other specified depressive episodes: Secondary | ICD-10-CM | POA: Diagnosis not present

## 2014-03-19 DIAGNOSIS — M129 Arthropathy, unspecified: Secondary | ICD-10-CM | POA: Insufficient documentation

## 2014-03-19 DIAGNOSIS — F17200 Nicotine dependence, unspecified, uncomplicated: Secondary | ICD-10-CM | POA: Diagnosis not present

## 2014-03-19 DIAGNOSIS — K089 Disorder of teeth and supporting structures, unspecified: Secondary | ICD-10-CM | POA: Diagnosis present

## 2014-03-19 DIAGNOSIS — Z8719 Personal history of other diseases of the digestive system: Secondary | ICD-10-CM | POA: Insufficient documentation

## 2014-03-19 DIAGNOSIS — Z79899 Other long term (current) drug therapy: Secondary | ICD-10-CM | POA: Insufficient documentation

## 2014-03-19 DIAGNOSIS — F329 Major depressive disorder, single episode, unspecified: Secondary | ICD-10-CM | POA: Insufficient documentation

## 2014-03-19 DIAGNOSIS — L03211 Cellulitis of face: Secondary | ICD-10-CM | POA: Diagnosis not present

## 2014-03-19 DIAGNOSIS — Z8619 Personal history of other infectious and parasitic diseases: Secondary | ICD-10-CM | POA: Diagnosis not present

## 2014-03-19 MED ORDER — KETOROLAC TROMETHAMINE 60 MG/2ML IM SOLN
60.0000 mg | Freq: Once | INTRAMUSCULAR | Status: AC
  Administered 2014-03-19: 60 mg via INTRAMUSCULAR
  Filled 2014-03-19: qty 2

## 2014-03-19 MED ORDER — METRONIDAZOLE 500 MG PO TABS: 500.0000 mg | ORAL_TABLET | Freq: Three times a day (TID) | ORAL | Status: DC

## 2014-03-19 MED ORDER — PENICILLIN V POTASSIUM 500 MG PO TABS: 500.0000 mg | ORAL_TABLET | Freq: Four times a day (QID) | ORAL | Status: AC

## 2014-03-19 MED ORDER — OXYCODONE-ACETAMINOPHEN 5-325 MG PO TABS: 2.0000 | ORAL_TABLET | ORAL | Status: DC | PRN

## 2014-03-19 NOTE — ED Provider Notes (Signed)
CSN: [id number]     Arrival date & time 03/19/14  4132 History   First MD Initiated Contact with Patient 03/19/14 0950     No chief complaint on file.    (Consider location/radiation/quality/duration/timing/severity/associated sxs/prior Treatment) Patient is a 53 y.o. male presenting with tooth pain. The history is provided by the patient. No language interpreter was used.  Dental Pain Location:  Lower Lower teeth location:  32/RL 3rd molar Quality:  Dull and aching Severity:  Severe Onset quality:  Gradual Duration:  1 week Timing:  Constant Progression:  Worsening Chronicity:  New Relieved by:  Nothing Worsened by:  Hot food/drink, jaw movement and pressure Ineffective treatments:  None tried Associated symptoms: facial pain, facial swelling and gum swelling   Associated symptoms: no congestion, no fever, no headaches, no neck swelling, no oral bleeding and no oral lesions  Trismus: reported, but not observed.   Risk factors: periodontal disease and smoking   Risk factors: no diabetes     Past Medical History  Diagnosis Date  . Back pain   . PUD (peptic ulcer disease)   . Hypertension   . Allergy   . GERD (gastroesophageal reflux disease)     takes  otc  . Spinal cord stimulator status     has had for 1 yr--inserted by [facility]  . Sleep apnea     was tested here at [facility].Marland KitchenMarland KitchenMarland KitchenNever heard anymore about it (03/30/2013)  . Arthritis     "joints" (03/30/2013)  . Chronic lower back pain   . Depression   . Hepatitis C     "tested for it; never told I had it" (03/30/2013)  . Pancreatitis, acute 03/30/2013   Past Surgical History  Procedure Laterality Date  . Back surgery    . Wrist surgery Right 1980's    repair of tendons and nerves  . Foot surgery Right 2012?    "took piece off that was hurting me" (03/30/2013)  . Spinal cord stimulator implant  11/14/2012  . Anterior cervical decomp/discectomy fusion  04/05/2012    Procedure: ANTERIOR CERVICAL DECOMPRESSION/DISCECTOMY  FUSION 1 LEVEL;  Surgeon: Elaina Hoops, MD;  Location: [place] [facility];  Service: Neurosurgery;  Laterality: N/A;  Cervical five-six anterior cervical decompression and fusion  . Posterior lumbar fusion      "L4-5" (03/31/2103)  . Hardware removal      "took screw out of my back; it had damaged my nerve" (03/30/2013)  . Incision and drainage abscess Left     "lower jaw, tooth abscess" (03/30/2013)   Family History  Problem Relation Age of Onset  . Heart disease Father   . Heart disease Sister   . Breast cancer Sister   . Bone cancer Mother   . Esophageal cancer Neg Hx   . Stomach cancer Neg Hx    History  Substance Use Topics  . Smoking status: Current Some Day Smoker -- 0.40 packs/day for 30 years    Types: Cigarettes  . Smokeless tobacco: Never Used     Comment: 8 cig per week  . Alcohol Use: 2.4 oz/week    4 Cans of beer per week     Comment: 1-2 beers/week. quit several months ago    Review of Systems  Constitutional: Negative for fever, activity change, appetite change and fatigue.  HENT: Positive for facial swelling. Negative for congestion, mouth sores, rhinorrhea and trouble swallowing.   Eyes: Negative for photophobia and pain.  Respiratory: Negative for cough, chest tightness and shortness of  breath.   Cardiovascular: Negative for chest pain and leg swelling.  Gastrointestinal: Negative for nausea, vomiting, abdominal pain, diarrhea and constipation.  Endocrine: Negative for polydipsia and polyuria.  Genitourinary: Negative for dysuria, urgency, decreased urine volume and difficulty urinating.  Musculoskeletal: Negative for back pain and gait problem.  Skin: Negative for color change, rash and wound.  Allergic/Immunologic: Negative for immunocompromised state.  Neurological: Negative for dizziness, facial asymmetry, speech difficulty, weakness, numbness and headaches.  Psychiatric/Behavioral: Negative for confusion, decreased concentration and agitation.      Allergies    Hydrocodone-acetaminophen  Home Medications   Prior to Admission medications   Medication Sig Start Date End Date Taking? Authorizing Provider  diazepam (VALIUM) 5 MG tablet Take 5 mg by mouth every 12 (twelve) hours as needed for anxiety.    Yes Historical Provider, MD  gemfibrozil (LOPID) 600 MG tablet Take 1 tablet (600 mg total) by mouth 2 (two) times daily before a meal. 04/07/13  Yes Dominic Pea, DO  hydrochlorothiazide (HYDRODIURIL) 25 MG tablet Take 25 mg by mouth daily.   Yes Historical Provider, MD  losartan (COZAAR) 50 MG tablet Take 50 mg by mouth daily.   Yes Historical Provider, MD  lovastatin (MEVACOR) 40 MG tablet Take 1 tablet (40 mg total) by mouth daily. 04/07/13  Yes Dominic Pea, DO  omega-3 acid ethyl esters (LOVAZA) 1 G capsule Take 1 capsule (1 g total) by mouth 2 (two) times daily. 04/07/13  Yes Dominic Pea, DO  sertraline (ZOLOFT) 100 MG tablet Take 100 mg by mouth daily.   Yes Historical Provider, MD  testosterone cypionate (DEPOTESTOTERONE CYPIONATE) 200 MG/ML injection Inject 200 mg into the muscle once a week. Once per week   Yes Historical Provider, MD  metroNIDAZOLE (FLAGYL) 500 MG tablet Take 1 tablet (500 mg total) by mouth 3 (three) times daily. For 10 days 03/19/14   Ernestina Patches, MD  oxyCODONE-acetaminophen (PERCOCET/ROXICET) 5-325 MG per tablet Take 2 tablets by mouth every 4 (four) hours as needed for moderate pain or severe pain. 03/19/14   Ernestina Patches, MD  penicillin v potassium (VEETID) 500 MG tablet Take 1 tablet (500 mg total) by mouth 4 (four) times daily. For 10 days 03/19/14 03/26/14  Ernestina Patches, MD   BP 132/83  Pulse 73  Temp(Src) 99 F (37.2 C) (Oral)  Resp 16  SpO2 97% Physical Exam  Constitutional: He is oriented to person, place, and time. He appears well-developed and well-nourished. No distress.  HENT:  Head: Normocephalic and atraumatic.    Mouth/Throat: No oropharyngeal exudate.    Eyes: Pupils are equal, round, and  reactive to light.  Neck: Normal range of motion. Neck supple.  Cardiovascular: Normal rate, regular rhythm and normal heart sounds.  Exam reveals no gallop and no friction rub.   No murmur heard. Pulmonary/Chest: Effort normal and breath sounds normal. No respiratory distress. He has no wheezes. He has no rales.  Abdominal: Soft. Bowel sounds are normal. He exhibits no distension and no mass. There is no tenderness. There is no rebound and no guarding.  Musculoskeletal: Normal range of motion. He exhibits no edema and no tenderness.  Neurological: He is alert and oriented to person, place, and time.  Skin: Skin is warm and dry.  Psychiatric: He has a normal mood and affect.    ED Course  Procedures (including critical care time) Labs Review Labs Reviewed - No data to display  Imaging Review No results found.   EKG Interpretation None  MDM   Final diagnoses:  Dental abscess  Facial cellulitis    Pt is a 53 y.o. male with Pmhx as above who presents with likely dental abscess and facial cellulitis.  He has very poor dentition and gum disease with purulent gum line at upper molars. He report trismus but has none on PE. He also reports difficulty swallowing, but doe snot appear to have trouble swallowing on PE and is handling secretions w/o difficulty. Will start on pen vk & flagyl incase this is early ANUG.  Dental f/u given. Return precautions given for new or worsening symptoms including worsening pain, swelling, trismus.           Ernestina Patches, MD 03/19/14 1041

## 2014-03-19 NOTE — Discharge Instructions (Signed)
Abscessed Tooth An abscessed tooth is an infection around your tooth. It may be caused by holes or damage to the tooth (cavity) or a dental disease. An abscessed tooth causes mild to very bad pain in and around the tooth. See your dentist right away if you have tooth or gum pain. HOME CARE  Take your medicine as told. Finish it even if you start to feel better.  Do not drive after taking pain medicine.  Rinse your mouth (gargle) often with salt water ( teaspoon salt in 8 ounces of warm water).  Do not apply heat to the outside of your face. GET HELP RIGHT AWAY IF:   You have a temperature by mouth above 102 F (38.9 C), not controlled by medicine.  You have chills and a very bad headache.  You have problems breathing or swallowing.  Your mouth will not open.  You develop puffiness (swelling) on the neck or around the eye.  Your pain is not helped by medicine.  Your pain is getting worse instead of better. MAKE SURE YOU:   Understand these instructions.  Will watch your condition.  Will get help right away if you are not doing well or get worse. Document Released: 12/31/2007 Document Revised: 10/06/2011 Document Reviewed: 10/22/2010 Exit[employer] Patient Information 2015 [employer]. This information is not intended to replace advice given to you by your health care provider. Make sure you discuss any questions you have with your health care provider.  

## 2014-04-25 ENCOUNTER — Encounter (HOSPITAL_COMMUNITY): Payer: Self-pay | Admitting: Emergency Medicine

## 2014-04-25 ENCOUNTER — Emergency Department (HOSPITAL_COMMUNITY)
Admission: EM | Admit: 2014-04-25 | Discharge: 2014-04-25 | Disposition: A | Discharge status: Home / Self Care | Payer: Medicare Other | Attending: Emergency Medicine | Admitting: Emergency Medicine

## 2014-04-25 ENCOUNTER — Emergency Department (HOSPITAL_COMMUNITY): Payer: Medicare Other

## 2014-04-25 DIAGNOSIS — Z8719 Personal history of other diseases of the digestive system: Secondary | ICD-10-CM | POA: Insufficient documentation

## 2014-04-25 DIAGNOSIS — I10 Essential (primary) hypertension: Secondary | ICD-10-CM | POA: Diagnosis not present

## 2014-04-25 DIAGNOSIS — Z79899 Other long term (current) drug therapy: Secondary | ICD-10-CM | POA: Diagnosis not present

## 2014-04-25 DIAGNOSIS — F17200 Nicotine dependence, unspecified, uncomplicated: Secondary | ICD-10-CM | POA: Diagnosis not present

## 2014-04-25 DIAGNOSIS — J441 Chronic obstructive pulmonary disease with (acute) exacerbation: Secondary | ICD-10-CM | POA: Insufficient documentation

## 2014-04-25 DIAGNOSIS — F3289 Other specified depressive episodes: Secondary | ICD-10-CM | POA: Insufficient documentation

## 2014-04-25 DIAGNOSIS — G8929 Other chronic pain: Secondary | ICD-10-CM | POA: Insufficient documentation

## 2014-04-25 DIAGNOSIS — R0602 Shortness of breath: Secondary | ICD-10-CM | POA: Diagnosis present

## 2014-04-25 DIAGNOSIS — F329 Major depressive disorder, single episode, unspecified: Secondary | ICD-10-CM | POA: Insufficient documentation

## 2014-04-25 DIAGNOSIS — M129 Arthropathy, unspecified: Secondary | ICD-10-CM | POA: Insufficient documentation

## 2014-04-25 DIAGNOSIS — Z8619 Personal history of other infectious and parasitic diseases: Secondary | ICD-10-CM | POA: Insufficient documentation

## 2014-04-25 DIAGNOSIS — R1011 Right upper quadrant pain: Secondary | ICD-10-CM | POA: Diagnosis not present

## 2014-04-25 LAB — COMPREHENSIVE METABOLIC PANEL
ALT: 27 U/L (ref 0–53)
AST: 26 U/L (ref 0–37)
Albumin: 3.9 g/dL (ref 3.5–5.2)
Alkaline Phosphatase: 87 U/L (ref 39–117)
Anion gap: 13 (ref 5–15)
BUN: 18 mg/dL (ref 6–23)
CO2: 22 mEq/L (ref 19–32)
Calcium: 9.4 mg/dL (ref 8.4–10.5)
Chloride: 102 mEq/L (ref 96–112)
Creatinine, Ser: 1.3 mg/dL (ref 0.50–1.35)
GFR calc Af Amer: 71 mL/min — ABNORMAL LOW (ref >90)
GFR calc non Af Amer: 62 mL/min — ABNORMAL LOW (ref >90)
Glucose, Bld: 106 mg/dL — ABNORMAL HIGH (ref 70–99)
Potassium: 4.9 mEq/L (ref 3.7–5.3)
Sodium: 137 mEq/L (ref 137–147)
Total Bilirubin: 1.2 mg/dL (ref 0.3–1.2)
Total Protein: 7.8 g/dL (ref 6.0–8.3)

## 2014-04-25 LAB — CBC WITH DIFFERENTIAL/PLATELET
Basophils Absolute: 0 10*3/uL (ref 0.0–0.1)
Basophils Relative: 0 % (ref 0–1)
Eosinophils Absolute: 0.2 10*3/uL (ref 0.0–0.7)
Eosinophils Relative: 2 % (ref 0–5)
HCT: 51 % (ref 39.0–52.0)
Hemoglobin: 16.7 g/dL (ref 13.0–17.0)
Lymphocytes Relative: 33 % (ref 12–46)
Lymphs Abs: 3.2 10*3/uL (ref 0.7–4.0)
MCH: 25.8 pg — ABNORMAL LOW (ref 26.0–34.0)
MCHC: 32.7 g/dL (ref 30.0–36.0)
MCV: 78.7 fL (ref 78.0–100.0)
Monocytes Absolute: 0.7 10*3/uL (ref 0.1–1.0)
Monocytes Relative: 8 % (ref 3–12)
Neutro Abs: 5.7 10*3/uL (ref 1.7–7.7)
Neutrophils Relative %: 57 % (ref 43–77)
Platelets: 192 10*3/uL (ref 150–400)
RBC: 6.48 MIL/uL — ABNORMAL HIGH (ref 4.22–5.81)
RDW: 16.1 % — ABNORMAL HIGH (ref 11.5–15.5)
WBC: 9.9 10*3/uL (ref 4.0–10.5)

## 2014-04-25 LAB — LIPASE, BLOOD: Lipase: 20 U/L (ref 11–59)

## 2014-04-25 LAB — I-STAT TROPONIN, ED: Troponin i, poc: 0.03 ng/mL (ref 0.00–0.08)

## 2014-04-25 MED ORDER — AEROCHAMBER Z-STAT PLUS/MEDIUM MISC
1.0000 | Freq: Once | Status: AC
  Administered 2014-04-25: 1

## 2014-04-25 MED ORDER — PREDNISONE 20 MG PO TABS
60.0000 mg | ORAL_TABLET | Freq: Once | ORAL | Status: AC
  Administered 2014-04-25: 60 mg via ORAL
  Filled 2014-04-25: qty 3

## 2014-04-25 MED ORDER — ALBUTEROL SULFATE (2.5 MG/3ML) 0.083% IN NEBU
5.0000 mg | INHALATION_SOLUTION | Freq: Once | RESPIRATORY_TRACT | Status: AC
  Administered 2014-04-25: 5 mg via RESPIRATORY_TRACT
  Filled 2014-04-25: qty 6

## 2014-04-25 MED ORDER — IOHEXOL 300 MG/ML  SOLN
100.0000 mL | Freq: Once | INTRAMUSCULAR | Status: AC | PRN
  Administered 2014-04-25: 100 mL via INTRAVENOUS

## 2014-04-25 MED ORDER — FENTANYL CITRATE 0.05 MG/ML IJ SOLN
100.0000 ug | Freq: Once | INTRAMUSCULAR | Status: AC
  Administered 2014-04-25: 100 ug via INTRAVENOUS
  Filled 2014-04-25: qty 2

## 2014-04-25 MED ORDER — IOHEXOL 300 MG/ML  SOLN
50.0000 mL | Freq: Once | INTRAMUSCULAR | Status: AC | PRN
  Administered 2014-04-25: 50 mL via ORAL

## 2014-04-25 MED ORDER — OXYCODONE-ACETAMINOPHEN 5-325 MG PO TABS
2.0000 | ORAL_TABLET | ORAL | Status: DC | PRN
Start: 2014-04-25 — End: 2014-08-10

## 2014-04-25 MED ORDER — PREDNISONE 10 MG PO TABS: 40.0000 mg | ORAL_TABLET | Freq: Every day | ORAL | Status: DC

## 2014-04-25 MED ORDER — IPRATROPIUM-ALBUTEROL 0.5-2.5 (3) MG/3ML IN SOLN
3.0000 mL | Freq: Once | RESPIRATORY_TRACT | Status: AC
  Administered 2014-04-25: 3 mL via RESPIRATORY_TRACT
  Filled 2014-04-25: qty 3

## 2014-04-25 MED ORDER — ALBUTEROL SULFATE HFA 108 (90 BASE) MCG/ACT IN AERS: 2.0000 | INHALATION_SPRAY | Freq: Four times a day (QID) | RESPIRATORY_TRACT | Status: DC | PRN

## 2014-04-25 MED ORDER — ALBUTEROL SULFATE HFA 108 (90 BASE) MCG/ACT IN AERS
2.0000 | INHALATION_SPRAY | Freq: Once | RESPIRATORY_TRACT | Status: AC
  Administered 2014-04-25: 2 via RESPIRATORY_TRACT
  Filled 2014-04-25: qty 6.7

## 2014-04-25 NOTE — ED Notes (Addendum)
Pt reports SOB starting around midnight today, states he has had nasal drainage for a few days, SOB worsens with mild exertion. Bilateral expiratory wheezing, inspiratory wheezing on right side. Pt c/o dizziness, denies blurred vision. Pt also complains of right sided abdominal pain onset Saturday after eating [employer], as well as swelling, back pain. Right mid abdomen is swollen. Pt states he has history of "pancreas problems" for which he has been hospitalized.

## 2014-04-25 NOTE — ED Provider Notes (Signed)
CSN: [id number]     Arrival date & time 04/25/14  1507 History   First MD Initiated Contact with Patient 04/25/14 1557     Chief Complaint  Patient presents with  . Shortness of Breath     (Consider location/radiation/quality/duration/timing/severity/associated sxs/prior Treatment) Patient is a 53 y.o. male presenting with shortness of breath. The history is provided by the patient. No language interpreter was used.  Shortness of Breath Severity:  Moderate Onset quality:  Sudden Duration:  1 day Timing:  Constant Progression:  Worsening Chronicity:  New Relieved by:  Nothing Worsened by:  Exertion and movement Ineffective treatments:  None tried Associated symptoms: abdominal pain and wheezing   Associated symptoms: no chest pain, no fever and no vomiting     Past Medical History  Diagnosis Date  . Back pain   . PUD (peptic ulcer disease)   . Hypertension   . Allergy   . GERD (gastroesophageal reflux disease)     takes  otc  . Spinal cord stimulator status     has had for 1 yr--inserted by [facility]  . Sleep apnea     was tested here at [facility].Marland KitchenMarland KitchenMarland KitchenNever heard anymore about it (03/30/2013)  . Arthritis     "joints" (03/30/2013)  . Chronic lower back pain   . Depression   . Hepatitis C     "tested for it; never told I had it" (03/30/2013)  . Pancreatitis, acute 03/30/2013   Past Surgical History  Procedure Laterality Date  . Back surgery    . Wrist surgery Right 1980's    repair of tendons and nerves  . Foot surgery Right 2012?    "took piece off that was hurting me" (03/30/2013)  . Spinal cord stimulator implant  11/14/2012  . Anterior cervical decomp/discectomy fusion  04/05/2012    Procedure: ANTERIOR CERVICAL DECOMPRESSION/DISCECTOMY FUSION 1 LEVEL;  Surgeon: Elaina Hoops, MD;  Location: [facility];  Service: Neurosurgery;  Laterality: N/A;  Cervical five-six anterior cervical decompression and fusion  . Posterior lumbar fusion      "L4-5" (03/31/2103)  . Hardware removal       "took screw out of my back; it had damaged my nerve" (03/30/2013)  . Incision and drainage abscess Left     "lower jaw, tooth abscess" (03/30/2013)   Family History  Problem Relation Age of Onset  . Heart disease Father   . Heart disease Sister   . Breast cancer Sister   . Bone cancer Mother   . Esophageal cancer Neg Hx   . Stomach cancer Neg Hx    History  Substance Use Topics  . Smoking status: Current Some Day Smoker -- 0.40 packs/day for 30 years    Types: Cigarettes  . Smokeless tobacco: Never Used     Comment: 8 cig per week  . Alcohol Use: 2.4 oz/week    4 Cans of beer per week     Comment: 1-2 beers/week. quit several months ago    Review of Systems  Constitutional: Negative for fever.  Respiratory: Positive for shortness of breath and wheezing.   Cardiovascular: Negative for chest pain.  Gastrointestinal: Positive for abdominal pain. Negative for vomiting.      Allergies  Hydrocodone-acetaminophen  Home Medications   Prior to Admission medications   Medication Sig Start Date End Date Taking? Authorizing Provider  diazepam (VALIUM) 5 MG tablet Take 5 mg by mouth daily as needed for anxiety.    Yes Historical Provider, MD  gemfibrozil (LOPID)  600 MG tablet Take 1 tablet (600 mg total) by mouth 2 (two) times daily before a meal. 04/07/13  Yes Dominic Pea, DO  hydrochlorothiazide (HYDRODIURIL) 25 MG tablet Take 25 mg by mouth daily.   Yes Historical Provider, MD  losartan (COZAAR) 50 MG tablet Take 50 mg by mouth daily.   Yes Historical Provider, MD  lovastatin (MEVACOR) 40 MG tablet Take 1 tablet (40 mg total) by mouth daily. 04/07/13  Yes Dominic Pea, DO  omega-3 acid ethyl esters (LOVAZA) 1 G capsule Take 1 capsule (1 g total) by mouth 2 (two) times daily. 04/07/13  Yes Dominic Pea, DO  oxyCODONE-acetaminophen (PERCOCET/ROXICET) 5-325 MG per tablet Take 2 tablets by mouth every 4 (four) hours as needed for moderate pain or severe pain. 03/19/14  Yes Ernestina Patches, MD  sertraline (ZOLOFT) 100 MG tablet Take 100 mg by mouth daily.   Yes Historical Provider, MD  testosterone cypionate (DEPOTESTOTERONE CYPIONATE) 200 MG/ML injection Inject 200 mg into the muscle once a week. Once per week   Yes Historical Provider, MD  albuterol (PROVENTIL HFA;VENTOLIN HFA) 108 (90 BASE) MCG/ACT inhaler Inhale 2-4 puffs into the lungs every 6 (six) hours as needed for wheezing or shortness of breath. 04/25/14   Ernestina Patches, MD  oxyCODONE-acetaminophen (PERCOCET) 5-325 MG per tablet Take 2 tablets by mouth every 4 (four) hours as needed. 04/25/14   Ernestina Patches, MD  predniSONE (DELTASONE) 10 MG tablet Take 4 tablets (40 mg total) by mouth daily. 04/25/14   Ernestina Patches, MD   BP 130/80  Pulse 75  Temp(Src) 99.2 F (37.3 C) (Oral)  Resp 18  Ht 5\' 10"  (1.778 m)  Wt 208 lb (94.348 kg)  BMI 29.84 kg/m2  SpO2 100% Physical Exam  ED Course  Procedures (including critical care time) Labs Review Labs Reviewed  CBC WITH DIFFERENTIAL - Abnormal; Notable for the following:    RBC 6.48 (*)    MCH 25.8 (*)    RDW 16.1 (*)    All other components within normal limits  COMPREHENSIVE METABOLIC PANEL - Abnormal; Notable for the following:    Glucose, Bld 106 (*)    GFR calc non Af Amer 62 (*)    GFR calc Af Amer 71 (*)    All other components within normal limits  LIPASE, BLOOD  I-STAT TROPOININ, ED    Imaging Review Dg Chest 2 View  04/25/2014   CLINICAL DATA:  Short of breath, heaviness in chest, cough  EXAM: CHEST  2 VIEW  COMPARISON:  Radiograph 12/28/2013  FINDINGS: Normal cardiac silhouette. Spine electrodes noted in the central canal terminating in the mid thoracic spine. Anterior cervical fusion noted. No pleural fluid. Normal pulmonary vasculature. No infiltrate.  IMPRESSION: No acute cardiopulmonary process.   Electronically Signed   By: Suzy Bouchard M.D.   On: 04/25/2014 16:21   Ct Abdomen Pelvis W Contrast  04/25/2014   CLINICAL DATA:  Shortness  of breath, right abdominal pain, back pain, right mid abdominal swelling  EXAM: CT ABDOMEN AND PELVIS WITH CONTRAST  TECHNIQUE: Multidetector CT imaging of the abdomen and pelvis was performed using the standard protocol following bolus administration of intravenous contrast.  CONTRAST:  17mL OMNIPAQUE IOHEXOL 300 MG/ML  SOLN  COMPARISON:  None.  FINDINGS: Lower chest:  Lung bases are clear.  Cardiomegaly.  Hepatobiliary: Mild hepatomegaly. No suspicious/enhancing hepatic lesions.  Gallbladder is unremarkable. No intrahepatic ductal dilatation. Common duct measures 8 mm but smoothly tapers at the ampulla.  Pancreas: Within  normal limits.  Spleen: Within normal limits.  Adrenals/Urinary Tract: Adrenal glands are unremarkable.  Kidneys are within normal limits.  No hydronephrosis.  Bladder is mildly thick-walled although underdistended.  Stomach/Bowel: Stomach is unremarkable.  No evidence of bowel obstruction.  Normal appendix.  Scattered colonic diverticulosis, without associated inflammatory changes. Mild to moderate colonic stool burden.  Vascular/Lymphatic: Atherosclerotic calcifications of the abdominal aorta and branch vessels.  No abdominopelvic ascites.  Reproductive: Prostatomegaly, with enlargement of the central gland which indents the base of the bladder.  Other: No abdominopelvic ascites.  Musculoskeletal: Status post PLIF at L4-5. Mild degenerative changes at L3-4.  IMPRESSION: Mild hepatomegaly.  No suspicious/enhancing hepatic lesions.  No evidence of bowel obstruction.  Normal appendix.   Electronically Signed   By: Julian Hy M.D.   On: 04/25/2014 20:08   US Abdomen Limited  04/25/2014   CLINICAL DATA:  Right upper quadrant abdominal pain.  EXAM: US ABDOMEN LIMITED - RIGHT UPPER QUADRANT  COMPARISON:  None.  FINDINGS: Gallbladder:  No gallstones or wall thickening visualized. No sonographic Murphy sign noted.  Common bile duct:  Diameter: Measures 8 mm which is above normal.  Liver:  No  focal lesion identified. Within normal limits in parenchymal echogenicity.  IMPRESSION: No cholelithiasis is noted. Common bile duct measures 8 mm which is above normal, and correlation with liver function tests is recommended to evaluate for possible common bile duct obstruction.   Electronically Signed   By: Sabino Dick M.D.   On: 04/25/2014 17:53     EKG Interpretation None      Date: 04/25/2014  Rate: 74  Rhythm: normal sinus rhythm  QRS Axis: indeterminate  Intervals: PR prolonged, QT prolonged  ST/T Wave abnormalities: nonspecific T wave changes  Conduction Disutrbances:first-degree A-V block   Narrative Interpretation:   Old EKG Reviewed: changes noted, new TWI V3-V6    MDM   Final diagnoses:  COPD exacerbation  RUQ abdominal pain    Pt is a 53 y.o. male with Pmhx as above who presents with 2 complaints. First is about 24 hours of nasal congestion, rhinorrhea, shortness of breath and wheezing without CP or fever. 2nd complaint is epigastric/RUQ pain for for 3 days after eating a large meal at [employer]  He reports a history of pancreatitis and states that this feels similar. On physical exam vital signs are stable he is in no acute distress. He has positive epigastric and right upper quadrant tenderness to palpation without rebound or guarding. Murphy's sign is negative. He has scattered  wheezing throughout on his pulmonary exam.    No acute findings to explain pt's pain on [place] or CT ab/pelvis. She has a above normal size common bile duct, however his LFTs are normal and I doubt bowel duct obstruction. Pain improved w/ fentanyl, no emesis in dept. Lungs somewhat improved after duoneb x2 and steroids. Chest x-ray is normal. I feel pt safe to continuoue outpt w/u with PCP and f/u appt later this week. Will d/c home w/ albuterol Q4, pred, percocet for pain. Return precautions given for new or worsening symptoms including worsening pain, chest pain, worsening SOB, fever.        Ernestina Patches, MD 04/25/14 [phone]

## 2014-04-25 NOTE — Discharge Instructions (Signed)
Chronic Obstructive Pulmonary Disease  Chronic obstructive pulmonary disease (COPD) is a common lung condition in which airflow from the lungs is limited. COPD is a general term that can be used to describe many different lung problems that limit airflow, including both chronic bronchitis and emphysema. If you have COPD, your lung function will probably never return to normal, but there are measures you can take to improve lung function and make yourself feel better.   CAUSES    Smoking (common).    Exposure to secondhand smoke.    Genetic problems.   Chronic inflammatory lung diseases or recurrent infections.  SYMPTOMS    Shortness of breath, especially with physical activity.    Deep, persistent (chronic) cough with a large amount of thick mucus.    Wheezing.    Rapid breaths (tachypnea).    Gray or bluish discoloration (cyanosis) of the skin, especially in fingers, toes, or lips.    Fatigue.    Weight loss.    Frequent infections or episodes when breathing symptoms become much worse (exacerbations).    Chest tightness.  DIAGNOSIS   Your health care provider will take a medical history and perform a physical examination to make the initial diagnosis. Additional tests for COPD may include:    Lung (pulmonary) function tests.   Chest X-ray.   CT scan.   Blood tests.  TREATMENT   Treatment available to help you feel better when you have COPD includes:    Inhaler and nebulizer medicines. These help manage the symptoms of COPD and make your breathing more comfortable.   Supplemental oxygen. Supplemental oxygen is only helpful if you have a low oxygen level in your blood.    Exercise and physical activity. These are beneficial for nearly all people with COPD. Some people may also benefit from a pulmonary rehabilitation program.  HOME CARE INSTRUCTIONS    Take all medicines (inhaled or pills) as directed by your health care provider.   Avoid over-the-counter medicines or cough syrups  that dry up your airway (such as antihistamines) and slow down the elimination of secretions unless instructed otherwise by your health care provider.    If you are a smoker, the most important thing that you can do is stop smoking. Continuing to smoke will cause further lung damage and breathing trouble. Ask your health care provider for help with quitting smoking. He or she can direct you to community resources or hospitals that provide support.   Avoid exposure to irritants such as smoke, chemicals, and fumes that aggravate your breathing.   Use oxygen therapy and pulmonary rehabilitation if directed by your health care provider. If you require home oxygen therapy, ask your health care provider whether you should purchase a pulse oximeter to measure your oxygen level at home.    Avoid contact with individuals who have a contagious illness.   Avoid extreme temperature and humidity changes.   Eat healthy foods. Eating smaller, more frequent meals and resting before meals may help you maintain your strength.   Stay active, but balance activity with periods of rest. Exercise and physical activity will help you maintain your ability to do things you want to do.   Preventing infection and hospitalization is very important when you have COPD. Make sure to receive all the vaccines your health care provider recommends, especially the pneumococcal and influenza vaccines. Ask your health care provider whether you need a pneumonia vaccine.   Learn and use relaxation techniques to manage stress.   Learn   going to whistle and breathe out (exhale) through the pursed lips for 2 seconds.   Diaphragmatic breathing. Start by putting one hand on your abdomen just above  your waist. Inhale slowly through your nose. The hand on your abdomen should move out. Then purse your lips and exhale slowly. You should be able to feel the hand on your abdomen moving in as you exhale.   Learn and use controlled coughing to clear mucus from your lungs. Controlled coughing is a series of short, progressive coughs. The steps of controlled coughing are:  1. Lean your head slightly forward.  2. Breathe in deeply using diaphragmatic breathing.  3. Try to hold your breath for 3 seconds.  4. Keep your mouth slightly open while coughing twice.  5. Spit any mucus out into a tissue.  6. Rest and repeat the steps once or twice as needed. SEEK MEDICAL CARE IF:   You are coughing up more mucus than usual.   There is a change in the color or thickness of your mucus.   Your breathing is more labored than usual.   Your breathing is faster than usual.  SEEK IMMEDIATE MEDICAL CARE IF:   You have shortness of breath while you are resting.   You have shortness of breath that prevents you from:  Being able to talk.   Performing your usual physical activities.   You have chest pain lasting longer than 5 minutes.   Your skin color is more cyanotic than usual.  You measure low oxygen saturations for longer than 5 minutes with a pulse oximeter. MAKE SURE YOU:   Understand these instructions.  Will watch your condition.  Will get help right away if you are not doing well or get worse. Document Released: 04/23/2005 Document Revised: 11/28/2013 Document Reviewed: 03/10/2013 [facility] Patient Information 2015 [place], [place]. This information is not intended to replace advice given to you by your health care provider. Make sure you discuss any questions you have with your health care provider.   Abdominal Pain Many things can cause belly (abdominal) pain. Most times, the belly pain is not dangerous. Many cases of belly pain can be watched and treated at home. HOME  CARE   Do not take medicines that help you go poop (laxatives) unless told to by your doctor.  Only take medicine as told by your doctor.  Eat or drink as told by your doctor. Your doctor will tell you if you should be on a special diet. GET HELP IF:  You do not know what is causing your belly pain.  You have belly pain while you are sick to your stomach (nauseous) or have runny poop (diarrhea).  You have pain while you pee or poop.  Your belly pain wakes you up at night.  You have belly pain that gets worse or better when you eat.  You have belly pain that gets worse when you eat fatty foods.  You have a fever. GET HELP RIGHT AWAY IF:   The pain does not go away within 2 hours.  You keep throwing up (vomiting).  The pain changes and is only in the right or left part of the belly.  You have bloody or tarry looking poop. MAKE SURE YOU:   Understand these instructions.  Will watch your condition.  Will get help right away if you are not doing well or get worse. Document Released: 12/31/2007 Document Revised: 07/19/2013 Document Reviewed: 03/23/2013 [facility] Patient Information 2015 [place], [place]. This information is  not intended to replace advice given to you by your health care provider. Make sure you discuss any questions you have with your health care provider.

## 2014-05-09 ENCOUNTER — Ambulatory Visit: Payer: Medicare Other | Admitting: Gastroenterology

## 2014-05-09 NOTE — Patient Instructions (Signed)
Letter mailed to the pt and referring

## 2014-05-09 NOTE — Progress Notes (Signed)
Patient ID: Jerome Adams, male   DOB: Jan 09, 1961, 53 y.o.   MRN: [id number]   Patient no-showed today's appointment provider notified for review of record","patient agrees to reschedule missed appointment"}.

## 2014-05-09 NOTE — Progress Notes (Signed)
Patient ID: Jerome Adams, male   DOB: 1961/01/02, 54 y.o.   MRN: [id number]   Please send a no-show letter to the patient recommending that they reschedule the appointment.   Also send a letter to the referring provider alerting them of the NO SHOW.

## 2014-05-19 ENCOUNTER — Ambulatory Visit: Payer: Medicare Other | Admitting: Pulmonary Disease

## 2014-07-30 ENCOUNTER — Emergency Department (HOSPITAL_COMMUNITY): Payer: Medicare Other

## 2014-07-30 ENCOUNTER — Encounter (HOSPITAL_COMMUNITY): Payer: Self-pay | Admitting: *Deleted

## 2014-07-30 ENCOUNTER — Inpatient Hospital Stay (HOSPITAL_COMMUNITY)
Admission: EM | Admit: 2014-07-30 | Discharge: 2014-08-04 | DRG: 287 | Disposition: A | Discharge status: Home / Self Care | Payer: Medicare Other | Attending: Internal Medicine | Admitting: Internal Medicine

## 2014-07-30 DIAGNOSIS — B1920 Unspecified viral hepatitis C without hepatic coma: Secondary | ICD-10-CM | POA: Diagnosis present

## 2014-07-30 DIAGNOSIS — I429 Cardiomyopathy, unspecified: Secondary | ICD-10-CM | POA: Diagnosis present

## 2014-07-30 DIAGNOSIS — Z885 Allergy status to narcotic agent status: Secondary | ICD-10-CM

## 2014-07-30 DIAGNOSIS — R0602 Shortness of breath: Secondary | ICD-10-CM | POA: Diagnosis not present

## 2014-07-30 DIAGNOSIS — D751 Secondary polycythemia: Secondary | ICD-10-CM | POA: Diagnosis present

## 2014-07-30 DIAGNOSIS — Z981 Arthrodesis status: Secondary | ICD-10-CM

## 2014-07-30 DIAGNOSIS — R0600 Dyspnea, unspecified: Secondary | ICD-10-CM

## 2014-07-30 DIAGNOSIS — R109 Unspecified abdominal pain: Secondary | ICD-10-CM

## 2014-07-30 DIAGNOSIS — I2510 Atherosclerotic heart disease of native coronary artery without angina pectoris: Secondary | ICD-10-CM | POA: Diagnosis present

## 2014-07-30 DIAGNOSIS — G4733 Obstructive sleep apnea (adult) (pediatric): Secondary | ICD-10-CM | POA: Diagnosis present

## 2014-07-30 DIAGNOSIS — I10 Essential (primary) hypertension: Secondary | ICD-10-CM | POA: Diagnosis present

## 2014-07-30 DIAGNOSIS — K761 Chronic passive congestion of liver: Secondary | ICD-10-CM | POA: Diagnosis present

## 2014-07-30 DIAGNOSIS — R100 Acute abdomen: Secondary | ICD-10-CM | POA: Diagnosis present

## 2014-07-30 DIAGNOSIS — F17210 Nicotine dependence, cigarettes, uncomplicated: Secondary | ICD-10-CM | POA: Diagnosis present

## 2014-07-30 DIAGNOSIS — K219 Gastro-esophageal reflux disease without esophagitis: Secondary | ICD-10-CM | POA: Diagnosis present

## 2014-07-30 DIAGNOSIS — E785 Hyperlipidemia, unspecified: Secondary | ICD-10-CM | POA: Diagnosis present

## 2014-07-30 DIAGNOSIS — Z86010 Personal history of colonic polyps: Secondary | ICD-10-CM

## 2014-07-30 DIAGNOSIS — J42 Unspecified chronic bronchitis: Secondary | ICD-10-CM

## 2014-07-30 DIAGNOSIS — Z8719 Personal history of other diseases of the digestive system: Secondary | ICD-10-CM

## 2014-07-30 DIAGNOSIS — J449 Chronic obstructive pulmonary disease, unspecified: Secondary | ICD-10-CM | POA: Diagnosis present

## 2014-07-30 DIAGNOSIS — I5021 Acute systolic (congestive) heart failure: Secondary | ICD-10-CM | POA: Diagnosis not present

## 2014-07-30 DIAGNOSIS — Z8711 Personal history of peptic ulcer disease: Secondary | ICD-10-CM

## 2014-07-30 DIAGNOSIS — K2970 Gastritis, unspecified, without bleeding: Secondary | ICD-10-CM | POA: Diagnosis present

## 2014-07-30 DIAGNOSIS — F329 Major depressive disorder, single episode, unspecified: Secondary | ICD-10-CM | POA: Diagnosis present

## 2014-07-30 DIAGNOSIS — K589 Irritable bowel syndrome without diarrhea: Secondary | ICD-10-CM | POA: Diagnosis present

## 2014-07-30 DIAGNOSIS — G894 Chronic pain syndrome: Secondary | ICD-10-CM | POA: Diagnosis present

## 2014-07-30 DIAGNOSIS — K861 Other chronic pancreatitis: Secondary | ICD-10-CM | POA: Diagnosis present

## 2014-07-30 DIAGNOSIS — R140 Abdominal distension (gaseous): Secondary | ICD-10-CM | POA: Diagnosis present

## 2014-07-30 DIAGNOSIS — F1020 Alcohol dependence, uncomplicated: Secondary | ICD-10-CM | POA: Diagnosis present

## 2014-07-30 DIAGNOSIS — R0683 Snoring: Secondary | ICD-10-CM | POA: Diagnosis present

## 2014-07-30 LAB — CBC
HCT: 55.5 % — ABNORMAL HIGH (ref 39.0–52.0)
Hemoglobin: 18.3 g/dL — ABNORMAL HIGH (ref 13.0–17.0)
MCH: 25.6 pg — ABNORMAL LOW (ref 26.0–34.0)
MCHC: 33 g/dL (ref 30.0–36.0)
MCV: 77.5 fL — ABNORMAL LOW (ref 78.0–100.0)
Platelets: 172 10*3/uL (ref 150–400)
RBC: 7.16 MIL/uL — ABNORMAL HIGH (ref 4.22–5.81)
RDW: 16.1 % — ABNORMAL HIGH (ref 11.5–15.5)
WBC: 6.9 10*3/uL (ref 4.0–10.5)

## 2014-07-30 LAB — COMPREHENSIVE METABOLIC PANEL
ALT: 182 U/L — ABNORMAL HIGH (ref 0–53)
AST: 112 U/L — ABNORMAL HIGH (ref 0–37)
Albumin: 3.6 g/dL (ref 3.5–5.2)
Alkaline Phosphatase: 66 U/L (ref 39–117)
Anion gap: 8 (ref 5–15)
BUN: 16 mg/dL (ref 6–23)
CO2: 24 mmol/L (ref 19–32)
Calcium: 8.9 mg/dL (ref 8.4–10.5)
Chloride: 102 mEq/L (ref 96–112)
Creatinine, Ser: 1.33 mg/dL (ref 0.50–1.35)
GFR calc Af Amer: 69 mL/min — ABNORMAL LOW (ref >90)
GFR calc non Af Amer: 60 mL/min — ABNORMAL LOW (ref >90)
Glucose, Bld: 117 mg/dL — ABNORMAL HIGH (ref 70–99)
Potassium: 4.2 mmol/L (ref 3.5–5.1)
Sodium: 134 mmol/L — ABNORMAL LOW (ref 135–145)
Total Bilirubin: 1.4 mg/dL — ABNORMAL HIGH (ref 0.3–1.2)
Total Protein: 5.9 g/dL — ABNORMAL LOW (ref 6.0–8.3)

## 2014-07-30 LAB — LIPASE, BLOOD: Lipase: 26 U/L (ref 11–59)

## 2014-07-30 LAB — I-STAT TROPONIN, ED: Troponin i, poc: 0.02 ng/mL (ref 0.00–0.08)

## 2014-07-30 LAB — BRAIN NATRIURETIC PEPTIDE: B Natriuretic Peptide: 1191.1 pg/mL — ABNORMAL HIGH (ref 0.0–100.0)

## 2014-07-30 MED ORDER — ONDANSETRON HCL 4 MG/2ML IJ SOLN
4.0000 mg | Freq: Once | INTRAMUSCULAR | Status: AC
  Administered 2014-07-30: 4 mg via INTRAVENOUS
  Filled 2014-07-30: qty 2

## 2014-07-30 MED ORDER — FUROSEMIDE 10 MG/ML IJ SOLN
40.0000 mg | Freq: Once | INTRAMUSCULAR | Status: AC
Start: 2014-07-30 — End: 2014-07-30
  Administered 2014-07-30: 40 mg via INTRAVENOUS
  Filled 2014-07-30: qty 4

## 2014-07-30 MED ORDER — HYDROMORPHONE HCL 1 MG/ML IJ SOLN
1.0000 mg | Freq: Once | INTRAMUSCULAR | Status: AC
  Administered 2014-07-30: 1 mg via INTRAVENOUS
  Filled 2014-07-30: qty 1

## 2014-07-30 MED ORDER — ALBUTEROL SULFATE (2.5 MG/3ML) 0.083% IN NEBU
5.0000 mg | INHALATION_SOLUTION | Freq: Once | RESPIRATORY_TRACT | Status: AC
  Administered 2014-07-31: 5 mg via RESPIRATORY_TRACT
  Filled 2014-07-30: qty 6

## 2014-07-30 MED ORDER — FUROSEMIDE 20 MG PO TABS: 20.0000 mg | ORAL_TABLET | Freq: Every day | ORAL | Status: DC

## 2014-07-30 MED ORDER — HYDROCHLOROTHIAZIDE 25 MG PO TABS: 25.0000 mg | ORAL_TABLET | Freq: Every day | ORAL | Status: DC

## 2014-07-30 MED ORDER — HYDROMORPHONE HCL 1 MG/ML IJ SOLN
1.0000 mg | Freq: Once | INTRAMUSCULAR | Status: AC
Start: 2014-07-30 — End: 2014-07-30
  Administered 2014-07-30: 1 mg via INTRAVENOUS
  Filled 2014-07-30: qty 1

## 2014-07-30 NOTE — ED Notes (Signed)
PT ambulated well. PT oxygen saturation dropped to 90 with ambulation. PT complained of SOB during ambulation.

## 2014-07-30 NOTE — ED Provider Notes (Signed)
CSN: [id number]     Arrival date & time 07/30/14  1718 History   First MD Initiated Contact with Patient 07/30/14 1947     Chief Complaint  Patient presents with  . Shortness of Breath     (Consider location/radiation/quality/duration/timing/severity/associated sxs/prior Treatment) Patient is a 54 y.o. male presenting with shortness of breath. The history is provided by the patient. No language interpreter was used.  Shortness of Breath Severity:  Moderate Onset quality:  Gradual Timing:  Constant Progression:  Worsening Chronicity:  New Context: activity   Relieved by:  Nothing Worsened by:  Nothing tried Ineffective treatments:  None tried Associated symptoms: no chest pain and no sputum production   Risk factors: obesity   Risk factors: no hx of PE/DVT     Past Medical History  Diagnosis Date  . Back pain   . PUD (peptic ulcer disease)   . Hypertension   . Allergy   . GERD (gastroesophageal reflux disease)     takes  otc  . Spinal cord stimulator status     has had for 1 yr--inserted by [facility]  . Sleep apnea     was tested here at [facility].Marland KitchenMarland KitchenMarland KitchenNever heard anymore about it (03/30/2013)  . Arthritis     "joints" (03/30/2013)  . Chronic lower back pain   . Depression   . Hepatitis C     "tested for it; never told I had it" (03/30/2013)  . Pancreatitis, acute 03/30/2013   Past Surgical History  Procedure Laterality Date  . Back surgery    . Wrist surgery Right 1980's    repair of tendons and nerves  . Foot surgery Right 2012?    "took piece off that was hurting me" (03/30/2013)  . Spinal cord stimulator implant  11/14/2012  . Anterior cervical decomp/discectomy fusion  04/05/2012    Procedure: ANTERIOR CERVICAL DECOMPRESSION/DISCECTOMY FUSION 1 LEVEL;  Surgeon: Elaina Hoops, MD;  Location: [place] [facility];  Service: Neurosurgery;  Laterality: N/A;  Cervical five-six anterior cervical decompression and fusion  . Posterior lumbar fusion      "L4-5" (03/31/2103)  . Hardware removal       "took screw out of my back; it had damaged my nerve" (03/30/2013)  . Incision and drainage abscess Left     "lower jaw, tooth abscess" (03/30/2013)   Family History  Problem Relation Age of Onset  . Heart disease Father   . Heart disease Sister   . Breast cancer Sister   . Bone cancer Mother   . Esophageal cancer Neg Hx   . Stomach cancer Neg Hx    History  Substance Use Topics  . Smoking status: Current Some Day Smoker -- 0.40 packs/day for 30 years    Types: Cigarettes  . Smokeless tobacco: Never Used     Comment: 8 cig per week  . Alcohol Use: 2.4 oz/week    4 Cans of beer per week     Comment: 1-2 beers/week. quit several months ago    Review of Systems  Respiratory: Positive for shortness of breath. Negative for sputum production.   Cardiovascular: Negative for chest pain.  All other systems reviewed and are negative.     Allergies  Hydrocodone-acetaminophen  Home Medications   Prior to Admission medications   Medication Sig Start Date End Date Taking? Authorizing Provider  albuterol (PROVENTIL HFA;VENTOLIN HFA) 108 (90 BASE) MCG/ACT inhaler Inhale 2-4 puffs into the lungs every 6 (six) hours as needed for wheezing or shortness of breath.  04/25/14   Ernestina Patches, MD  diazepam (VALIUM) 5 MG tablet Take 5 mg by mouth daily as needed for anxiety.     Historical Provider, MD  gemfibrozil (LOPID) 600 MG tablet Take 1 tablet (600 mg total) by mouth 2 (two) times daily before a meal. 04/07/13   Dominic Pea, DO  hydrochlorothiazide (HYDRODIURIL) 25 MG tablet Take 25 mg by mouth daily.    Historical Provider, MD  losartan (COZAAR) 50 MG tablet Take 50 mg by mouth daily.    Historical Provider, MD  lovastatin (MEVACOR) 40 MG tablet Take 1 tablet (40 mg total) by mouth daily. 04/07/13   Dominic Pea, DO  omega-3 acid ethyl esters (LOVAZA) 1 G capsule Take 1 capsule (1 g total) by mouth 2 (two) times daily. 04/07/13   Dominic Pea, DO  oxyCODONE-acetaminophen (PERCOCET)  5-325 MG per tablet Take 2 tablets by mouth every 4 (four) hours as needed. 04/25/14   Ernestina Patches, MD  oxyCODONE-acetaminophen (PERCOCET/ROXICET) 5-325 MG per tablet Take 2 tablets by mouth every 4 (four) hours as needed for moderate pain or severe pain. 03/19/14   Ernestina Patches, MD  predniSONE (DELTASONE) 10 MG tablet Take 4 tablets (40 mg total) by mouth daily. 04/25/14   Ernestina Patches, MD  sertraline (ZOLOFT) 100 MG tablet Take 100 mg by mouth daily.    Historical Provider, MD  testosterone cypionate (DEPOTESTOTERONE CYPIONATE) 200 MG/ML injection Inject 200 mg into the muscle once a week. Once per week    Historical Provider, MD   BP 147/106 mmHg  Pulse 92  Temp(Src) 97.5 F (36.4 C) (Oral)  Resp 14  SpO2 100% Physical Exam  Constitutional: He is oriented to person, place, and time. He appears well-developed and well-nourished.  HENT:  Head: Normocephalic and atraumatic.  Right Ear: External ear normal.  Left Ear: External ear normal.  Nose: Nose normal.  Mouth/Throat: Oropharynx is clear and moist.  Eyes: Conjunctivae and EOM are normal. Pupils are equal, round, and reactive to light.  Neck: Normal range of motion. Neck supple.  Cardiovascular: Normal rate, regular rhythm and normal heart sounds.   Pulmonary/Chest: Effort normal. He has no wheezes.  Abdominal: Soft.  Musculoskeletal: Normal range of motion.  Neurological: He is alert and oriented to person, place, and time. He has normal reflexes.  Skin: Skin is warm.  Psychiatric: He has a normal mood and affect.  Nursing note and vitals reviewed.   ED Course  Procedures (including critical care time) Labs Review Labs Reviewed  CBC - Abnormal; Notable for the following:    RBC 7.16 (*)    Hemoglobin 18.3 (*)    HCT 55.5 (*)    MCV 77.5 (*)    MCH 25.6 (*)    RDW 16.1 (*)    All other components within normal limits  BRAIN NATRIURETIC PEPTIDE - Abnormal; Notable for the following:    B Natriuretic Peptide 1191.1  (*)    All other components within normal limits  COMPREHENSIVE METABOLIC PANEL - Abnormal; Notable for the following:    Sodium 134 (*)    Glucose, Bld 117 (*)    Total Protein 5.9 (*)    AST 112 (*)    ALT 182 (*)    Total Bilirubin 1.4 (*)    GFR calc non Af Amer 60 (*)    GFR calc Af Amer 69 (*)    All other components within normal limits  LIPASE, BLOOD  I-STAT TROPOININ, ED    Imaging Review Dg  Chest 2 View  07/30/2014   CLINICAL DATA:  Initial evaluation for shortness of breath and upper abdominal pain for a while, worse lately, shortness of breath worse when walking or active, patient smokes  EXAM: CHEST  2 VIEW  COMPARISON:  04/25/2014  FINDINGS: Moderately severe cardiac enlargement. Mild to moderate vascular congestion. No pulmonary edema consolidation or effusion. Spinal stimulator again identified.  IMPRESSION: Similar to prior study there is moderate cardiac enlargement and vascular congestion. No acute findings.   Electronically Signed   By: Skipper Cliche M.D.   On: 07/30/2014 18:32     EKG Interpretation None      MDM Pt has elevated BNP.  Chest xray shows cardiac enlargement and vascular congestion.Radiologist reports similar to previous  Pt has not been taking his hctz at home.   Pt given Lasix IV.   Pt has chronic back pain.   Pt given medication for pain.   Pt ambulated.  He dropped his oxygen level to 89-90% and became very short of breath.  Pt given albuterol neb.   I spoke to the INternal Medicine Resident who will admit.   (Pt has been followed by Internal medicine but also sees a Dr. Sheryle Hail in [place] for pain management.)    Final diagnoses:  Chronic bronchitis, unspecified chronic bronchitis type        Fransico Meadow, PA-C 07/31/14 0028  Mariea Clonts, MD 07/31/14 0040

## 2014-07-30 NOTE — ED Notes (Signed)
Pt reports having sob and upper abd discomfort. Reports being seen at [facility] for same in past and was told he has copd. Airway is intact at triage, ekg done. Pt feels like abd is distended, denies swelling to extremities.

## 2014-07-31 ENCOUNTER — Inpatient Hospital Stay (HOSPITAL_COMMUNITY): Payer: Medicare Other

## 2014-07-31 ENCOUNTER — Encounter (HOSPITAL_COMMUNITY): Payer: Self-pay

## 2014-07-31 DIAGNOSIS — Z8719 Personal history of other diseases of the digestive system: Secondary | ICD-10-CM

## 2014-07-31 DIAGNOSIS — I10 Essential (primary) hypertension: Secondary | ICD-10-CM | POA: Diagnosis present

## 2014-07-31 DIAGNOSIS — Z981 Arthrodesis status: Secondary | ICD-10-CM | POA: Diagnosis not present

## 2014-07-31 DIAGNOSIS — R7989 Other specified abnormal findings of blood chemistry: Secondary | ICD-10-CM

## 2014-07-31 DIAGNOSIS — J449 Chronic obstructive pulmonary disease, unspecified: Secondary | ICD-10-CM | POA: Diagnosis present

## 2014-07-31 DIAGNOSIS — Z8711 Personal history of peptic ulcer disease: Secondary | ICD-10-CM | POA: Diagnosis not present

## 2014-07-31 DIAGNOSIS — R0602 Shortness of breath: Secondary | ICD-10-CM | POA: Diagnosis present

## 2014-07-31 DIAGNOSIS — I509 Heart failure, unspecified: Secondary | ICD-10-CM

## 2014-07-31 DIAGNOSIS — K2970 Gastritis, unspecified, without bleeding: Secondary | ICD-10-CM | POA: Diagnosis present

## 2014-07-31 DIAGNOSIS — I429 Cardiomyopathy, unspecified: Secondary | ICD-10-CM | POA: Diagnosis present

## 2014-07-31 DIAGNOSIS — K219 Gastro-esophageal reflux disease without esophagitis: Secondary | ICD-10-CM | POA: Diagnosis present

## 2014-07-31 DIAGNOSIS — F17210 Nicotine dependence, cigarettes, uncomplicated: Secondary | ICD-10-CM | POA: Diagnosis present

## 2014-07-31 DIAGNOSIS — K861 Other chronic pancreatitis: Secondary | ICD-10-CM | POA: Diagnosis present

## 2014-07-31 DIAGNOSIS — F1020 Alcohol dependence, uncomplicated: Secondary | ICD-10-CM | POA: Diagnosis present

## 2014-07-31 DIAGNOSIS — E785 Hyperlipidemia, unspecified: Secondary | ICD-10-CM

## 2014-07-31 DIAGNOSIS — Z86010 Personal history of colonic polyps: Secondary | ICD-10-CM | POA: Diagnosis not present

## 2014-07-31 DIAGNOSIS — D751 Secondary polycythemia: Secondary | ICD-10-CM | POA: Diagnosis present

## 2014-07-31 DIAGNOSIS — K589 Irritable bowel syndrome without diarrhea: Secondary | ICD-10-CM | POA: Diagnosis present

## 2014-07-31 DIAGNOSIS — R1010 Upper abdominal pain, unspecified: Secondary | ICD-10-CM

## 2014-07-31 DIAGNOSIS — Z885 Allergy status to narcotic agent status: Secondary | ICD-10-CM | POA: Diagnosis not present

## 2014-07-31 DIAGNOSIS — R1084 Generalized abdominal pain: Secondary | ICD-10-CM

## 2014-07-31 DIAGNOSIS — R140 Abdominal distension (gaseous): Secondary | ICD-10-CM | POA: Diagnosis present

## 2014-07-31 DIAGNOSIS — R935 Abnormal findings on diagnostic imaging of other abdominal regions, including retroperitoneum: Secondary | ICD-10-CM

## 2014-07-31 DIAGNOSIS — I5021 Acute systolic (congestive) heart failure: Secondary | ICD-10-CM | POA: Diagnosis present

## 2014-07-31 DIAGNOSIS — F329 Major depressive disorder, single episode, unspecified: Secondary | ICD-10-CM | POA: Diagnosis present

## 2014-07-31 DIAGNOSIS — B1920 Unspecified viral hepatitis C without hepatic coma: Secondary | ICD-10-CM | POA: Diagnosis present

## 2014-07-31 DIAGNOSIS — K761 Chronic passive congestion of liver: Secondary | ICD-10-CM | POA: Diagnosis present

## 2014-07-31 DIAGNOSIS — I2510 Atherosclerotic heart disease of native coronary artery without angina pectoris: Secondary | ICD-10-CM | POA: Diagnosis present

## 2014-07-31 DIAGNOSIS — G4733 Obstructive sleep apnea (adult) (pediatric): Secondary | ICD-10-CM | POA: Diagnosis present

## 2014-07-31 DIAGNOSIS — K210 Gastro-esophageal reflux disease with esophagitis: Secondary | ICD-10-CM

## 2014-07-31 DIAGNOSIS — G894 Chronic pain syndrome: Secondary | ICD-10-CM | POA: Diagnosis present

## 2014-07-31 DIAGNOSIS — R100 Acute abdomen: Secondary | ICD-10-CM | POA: Diagnosis present

## 2014-07-31 HISTORY — DX: Chronic obstructive pulmonary disease, unspecified: J44.9

## 2014-07-31 LAB — RAPID URINE DRUG SCREEN, HOSP PERFORMED
Amphetamines: NOT DETECTED
Barbiturates: NOT DETECTED
Benzodiazepines: POSITIVE — AB
Cocaine: NOT DETECTED
Opiates: POSITIVE — AB
Tetrahydrocannabinol: NOT DETECTED

## 2014-07-31 LAB — HEPATIC FUNCTION PANEL
ALT: 230 U/L — ABNORMAL HIGH (ref 0–53)
AST: 159 U/L — ABNORMAL HIGH (ref 0–37)
Albumin: 3.7 g/dL (ref 3.5–5.2)
Alkaline Phosphatase: 63 U/L (ref 39–117)
Bilirubin, Direct: 0.5 mg/dL — ABNORMAL HIGH (ref 0.0–0.3)
Indirect Bilirubin: 1.2 mg/dL — ABNORMAL HIGH (ref 0.3–0.9)
Total Bilirubin: 1.7 mg/dL — ABNORMAL HIGH (ref 0.3–1.2)
Total Protein: 6.2 g/dL (ref 6.0–8.3)

## 2014-07-31 LAB — TROPONIN I
Troponin I: 0.04 ng/mL — ABNORMAL HIGH (ref <0.031)
Troponin I: 0.04 ng/mL — ABNORMAL HIGH (ref <0.031)
Troponin I: 0.03 ng/mL (ref <0.031)

## 2014-07-31 LAB — CBC
HCT: 55 % — ABNORMAL HIGH (ref 39.0–52.0)
Hemoglobin: 18.1 g/dL — ABNORMAL HIGH (ref 13.0–17.0)
MCH: 25.7 pg — ABNORMAL LOW (ref 26.0–34.0)
MCHC: 32.9 g/dL (ref 30.0–36.0)
MCV: 78.2 fL (ref 78.0–100.0)
Platelets: 179 10*3/uL (ref 150–400)
RBC: 7.03 MIL/uL — ABNORMAL HIGH (ref 4.22–5.81)
RDW: 15.9 % — ABNORMAL HIGH (ref 11.5–15.5)
WBC: 8.4 10*3/uL (ref 4.0–10.5)

## 2014-07-31 LAB — COMPREHENSIVE METABOLIC PANEL
ALT: 226 U/L — ABNORMAL HIGH (ref 0–53)
AST: 143 U/L — ABNORMAL HIGH (ref 0–37)
Albumin: 3.7 g/dL (ref 3.5–5.2)
Alkaline Phosphatase: 63 U/L (ref 39–117)
Anion gap: 10 (ref 5–15)
BUN: 14 mg/dL (ref 6–23)
CO2: 27 mmol/L (ref 19–32)
Calcium: 9.1 mg/dL (ref 8.4–10.5)
Chloride: 103 mEq/L (ref 96–112)
Creatinine, Ser: 1.37 mg/dL — ABNORMAL HIGH (ref 0.50–1.35)
GFR calc Af Amer: 67 mL/min — ABNORMAL LOW (ref >90)
GFR calc non Af Amer: 57 mL/min — ABNORMAL LOW (ref >90)
Glucose, Bld: 144 mg/dL — ABNORMAL HIGH (ref 70–99)
Potassium: 4 mmol/L (ref 3.5–5.1)
Sodium: 140 mmol/L (ref 135–145)
Total Bilirubin: 1.4 mg/dL — ABNORMAL HIGH (ref 0.3–1.2)
Total Protein: 6.4 g/dL (ref 6.0–8.3)

## 2014-07-31 LAB — MRSA PCR SCREENING: MRSA by PCR: NEGATIVE

## 2014-07-31 LAB — HIV ANTIBODY (ROUTINE TESTING W REFLEX): HIV 1&2 Ab, 4th Generation: NONREACTIVE

## 2014-07-31 LAB — GLUCOSE, CAPILLARY: Glucose-Capillary: 108 mg/dL — ABNORMAL HIGH (ref 70–99)

## 2014-07-31 LAB — PROTIME-INR
INR: 1.17 (ref 0.00–1.49)
Prothrombin Time: 15 seconds (ref 11.6–15.2)

## 2014-07-31 MED ORDER — OXYCODONE HCL 5 MG PO TABS
10.0000 mg | ORAL_TABLET | Freq: Four times a day (QID) | ORAL | Status: DC | PRN
  Administered 2014-07-31 – 2014-08-03 (×11): 10 mg via ORAL
  Filled 2014-07-31 (×12): qty 2

## 2014-07-31 MED ORDER — SODIUM CHLORIDE 0.9 % IJ SOLN
3.0000 mL | Freq: Two times a day (BID) | INTRAMUSCULAR | Status: DC
  Administered 2014-07-31 (×2): 3 mL via INTRAVENOUS

## 2014-07-31 MED ORDER — GEMFIBROZIL 600 MG PO TABS
600.0000 mg | ORAL_TABLET | Freq: Two times a day (BID) | ORAL | Status: DC
  Administered 2014-07-31 – 2014-08-04 (×9): 600 mg via ORAL
  Filled 2014-07-31 (×11): qty 1

## 2014-07-31 MED ORDER — FOLIC ACID 1 MG PO TABS
1.0000 mg | ORAL_TABLET | Freq: Every day | ORAL | Status: DC
  Administered 2014-07-31 – 2014-08-04 (×4): 1 mg via ORAL
  Filled 2014-07-31 (×6): qty 1

## 2014-07-31 MED ORDER — PNEUMOCOCCAL VAC POLYVALENT 25 MCG/0.5ML IJ INJ
0.5000 mL | INJECTION | INTRAMUSCULAR | Status: AC
  Administered 2014-08-01: 0.5 mL via INTRAMUSCULAR
  Filled 2014-07-31: qty 0.5

## 2014-07-31 MED ORDER — SODIUM CHLORIDE 0.9 % IJ SOLN
3.0000 mL | Freq: Two times a day (BID) | INTRAMUSCULAR | Status: DC
  Administered 2014-07-31 – 2014-08-03 (×8): 3 mL via INTRAVENOUS

## 2014-07-31 MED ORDER — STERILE WATER FOR INJECTION IJ SOLN
INTRAMUSCULAR | Status: AC
Start: 2014-07-31 — End: 2014-08-01
  Filled 2014-07-31: qty 10

## 2014-07-31 MED ORDER — MULTIVITAMIN PO LIQD: 1.0000 | Freq: Every day | ORAL | Status: DC

## 2014-07-31 MED ORDER — SINCALIDE 5 MCG IJ SOLR
INTRAMUSCULAR | Status: AC
  Filled 2014-07-31: qty 5

## 2014-07-31 MED ORDER — SODIUM CHLORIDE 0.9 % IV SOLN: 250.0000 mL | INTRAVENOUS | Status: DC | PRN

## 2014-07-31 MED ORDER — THIAMINE HCL 100 MG/ML IJ SOLN
100.0000 mg | Freq: Every day | INTRAMUSCULAR | Status: AC
  Administered 2014-07-31 – 2014-08-01 (×2): 100 mg via INTRAVENOUS
  Filled 2014-07-31 (×3): qty 1

## 2014-07-31 MED ORDER — HYDROMORPHONE HCL 1 MG/ML IJ SOLN
1.0000 mg | INTRAMUSCULAR | Status: DC | PRN
  Administered 2014-07-31 – 2014-08-03 (×15): 1 mg via INTRAVENOUS
  Filled 2014-07-31 (×14): qty 1

## 2014-07-31 MED ORDER — ADULT MULTIVITAMIN W/MINERALS CH
1.0000 | ORAL_TABLET | Freq: Every day | ORAL | Status: DC
  Administered 2014-07-31 – 2014-08-04 (×4): 1 via ORAL
  Filled 2014-07-31 (×6): qty 1

## 2014-07-31 MED ORDER — DIAZEPAM 5 MG PO TABS: 5.0000 mg | ORAL_TABLET | Freq: Every day | ORAL | Status: DC | PRN

## 2014-07-31 MED ORDER — OMEGA-3-ACID ETHYL ESTERS 1 G PO CAPS
1.0000 g | ORAL_CAPSULE | Freq: Two times a day (BID) | ORAL | Status: DC
  Administered 2014-07-31 – 2014-08-04 (×8): 1 g via ORAL
  Filled 2014-07-31 (×12): qty 1

## 2014-07-31 MED ORDER — LOSARTAN POTASSIUM 50 MG PO TABS
50.0000 mg | ORAL_TABLET | Freq: Every day | ORAL | Status: DC
  Administered 2014-07-31 – 2014-08-04 (×4): 50 mg via ORAL
  Filled 2014-07-31 (×6): qty 1

## 2014-07-31 MED ORDER — SERTRALINE HCL 100 MG PO TABS
100.0000 mg | ORAL_TABLET | Freq: Every day | ORAL | Status: DC
  Administered 2014-07-31 – 2014-08-04 (×5): 100 mg via ORAL
  Filled 2014-07-31 (×6): qty 1

## 2014-07-31 MED ORDER — SODIUM CHLORIDE 0.9 % IJ SOLN: 3.0000 mL | INTRAMUSCULAR | Status: DC | PRN

## 2014-07-31 MED ORDER — ONDANSETRON HCL 4 MG/2ML IJ SOLN: 4.0000 mg | Freq: Three times a day (TID) | INTRAMUSCULAR | Status: DC | PRN

## 2014-07-31 MED ORDER — SINCALIDE 5 MCG IJ SOLR
0.0200 ug/kg | Freq: Once | INTRAMUSCULAR | Status: AC
  Administered 2014-07-31: 1.9 ug via INTRAVENOUS

## 2014-07-31 MED ORDER — TECHNETIUM TC 99M MEBROFENIN IV KIT
5.0000 | PACK | Freq: Once | INTRAVENOUS | Status: AC | PRN
  Administered 2014-07-31: 5 via INTRAVENOUS

## 2014-07-31 MED ORDER — ALBUTEROL SULFATE (2.5 MG/3ML) 0.083% IN NEBU: 5.0000 mg | INHALATION_SOLUTION | RESPIRATORY_TRACT | Status: DC | PRN

## 2014-07-31 MED ORDER — HEPARIN SODIUM (PORCINE) 5000 UNIT/ML IJ SOLN
5000.0000 [IU] | Freq: Three times a day (TID) | INTRAMUSCULAR | Status: DC
  Administered 2014-07-31 – 2014-08-04 (×12): 5000 [IU] via SUBCUTANEOUS
  Filled 2014-07-31 (×16): qty 1

## 2014-07-31 MED ORDER — PANTOPRAZOLE SODIUM 40 MG PO TBEC
40.0000 mg | DELAYED_RELEASE_TABLET | Freq: Every day | ORAL | Status: DC
  Administered 2014-07-31: 40 mg via ORAL
  Filled 2014-07-31: qty 1

## 2014-07-31 NOTE — Care Management Note (Unsigned)
    Page 1 of 1   08/02/2014     5:23:13 PM CARE MANAGEMENT NOTE 08/02/2014  Patient:  Jerome Adams, Jerome Adams   Account Number:  [id number]  Date Initiated:  07/31/2014  Documentation initiated by:  Deleon Passe  Subjective/Objective Assessment:   Pt adm on 07/30/13 with abd pain, likely chronic pancreatitis.  PTA, pt independent, lives alone.     Action/Plan:   Will cont to follow for dc needs as pt progresses.   Anticipated DC Date:  08/02/2014   Anticipated DC Plan:  [place]  CM consult      Choice offered to / List presented to:             Status of service:  In process, will continue to follow Medicare Important Message given?  YES (If response is "NO", the following Medicare IM given date fields will be blank) Date Medicare IM given:  08/02/2014 Medicare IM given by:  Nykerria Macconnell Date Additional Medicare IM given:   Additional Medicare IM given by:    Discharge Disposition:    Per UR Regulation:  Reviewed for med. necessity/level of care/duration of stay  If discussed at [place] of Stay Meetings, dates discussed:    Comments:

## 2014-07-31 NOTE — Progress Notes (Signed)
Pt NPO no meds and no narcotic as instructed by Cristie Hem, as pt is scheduled for Hida scan. To day.  Pt made aware.  Will continue to monitor.  Karie Kirks, Therapist, sports.

## 2014-07-31 NOTE — Progress Notes (Signed)
Patient Demographics  Jerome Adams, is a 54 y.o. male, DOB - 04-30-1961, [id number]  Admit date - 07/30/2014   Admitting Physician Ivor Costa, MD  Outpatient Primary MD for the patient is [facility], MD  LOS - 1   Chief Complaint  Patient presents with  . Shortness of Breath      Admission history of present illness/brief narrative: Jerome Adams is a 54 y.o. male with past medical history of hypertension, hyperlipidemia, GERD, depression, hepatitis C, history of alcoholic pancreatitis, COPD, smoking, who presented with abdominal pain and shortness of breath.  He attributes his shortness of breath to abdominal pain which limits his breathing. He does not have chest pain, cough, fever or chills. He reports that he has been being weak and sweating a lot. Patient denies fever, chills, headaches, cough, chest pain, dysuria, urgency, frequency, hematuria, skin rashes, joint pain or leg swelling.  With history of alcoholic pancreatitis in the past, lipase level within normal limits ,presents with complaints of abdominal pain, ultrasound showing mildly dilated common bile duct, and workup significant for mildly elevated LFTs including ALT, AST, and total bilirubin.    Subjective:   Jerome Adams today has, No headache, No chest pain,  - No Nausea, No new weakness tingling or numbness, No Cough . And planes of shortness of breath, abdominal pain.  Assessment & Plan    Principal Problem:   Abdominal pain Active Problems:   GERD   Essential hypertension, benign   HCV (hepatitis C virus)   Hyperlipidemia   History of pancreatitis   OSA (obstructive sleep apnea)   COPD (chronic obstructive pulmonary disease)   chf  Abdominal  pain: - Unclear etiology, as mildly elevated LFTs, ultrasound showing common bile duct above normal range, on history of alcoholic  pancreatitis in the past, but lipase within normal limits, no recent heavy alcohol abuse. - Continue to monitor LFTs, will follow on HIDA scan, gastroenterology consulted. - Recent endoscopy showing gastritis, will start on Protonix.  Shortness of breath - Most likely due to CHF exacerbation, 2-D echo in June 2009 showing EF of 45-50 %. - Patient had mild JVD, elevated BNP. - Will recheck 2-D echo. - Received IV Lasix in ED, will monitor closely volume status, if needed will restart on Lasix daily. - Continue his aspirin, Cozaar.  Hyperlipidemia - Hold statin given his elevated LFTs - Continue with gemfibrozil.  Hypertension:  -continue Cozaar  COPD: Patient does not have wheezing or rhonchi on auscultation. Does not seem to have COPD exacerbation. -Albuterol and DuoNeb nebulizers  GERD: -Protonix  Code Status: Full  Family Communication: None at bedside  Disposition Plan: Remains inpatient   Procedures  None   Consults   Gastroenterology   Medications  Scheduled Meds: . folic acid  1 mg Oral Daily  . gemfibrozil  600 mg Oral BID AC  . heparin  5,000 Units Subcutaneous 3 times per day  . losartan  50 mg Oral Daily  . multivitamin with minerals  1 tablet Oral Daily  . omega-3 acid ethyl esters  1 g Oral BID  . pantoprazole  40 mg Oral Q1200  . [START ON 08/01/2014] pneumococcal 23 valent vaccine  0.5 mL Intramuscular Tomorrow-1000  . sertraline  100  mg Oral Daily  . sodium chloride  3 mL Intravenous Q12H  . sodium chloride  3 mL Intravenous Q12H  . thiamine IV  100 mg Intravenous Daily   Continuous Infusions:  PRN Meds:.sodium chloride, albuterol, diazepam, HYDROmorphone (DILAUDID) injection, ondansetron, oxyCODONE, sodium chloride  DVT Prophylaxis  Heparin -   Lab Results  Component Value Date   PLT 179 07/31/2014    Antibiotics    Anti-infectives    None          Objective:   Filed Vitals:   07/30/14 2341 07/31/14 0100 07/31/14 0206  07/31/14 0608  BP:  129/104 139/95 111/82  Pulse: 115  94 83  Temp:   97.8 F (36.6 C) 97.8 F (36.6 C)  TempSrc:   Oral Oral  Resp:  19 18 17   Height:   5\' 10"  (1.778 m)   Weight:   95.2 kg (209 lb 14.1 oz) 95.2 kg (209 lb 14.1 oz)  SpO2: 90% 98% 100% 98%    Wt Readings from Last 3 Encounters:  07/31/14 95.2 kg (209 lb 14.1 oz)  04/25/14 94.348 kg (208 lb)  02/27/14 93.441 kg (206 lb)     Intake/Output Summary (Last 24 hours) at 07/31/14 1239 Last data filed at 07/31/14 0824  Gross per 24 hour  Intake      0 ml  Output      0 ml  Net      0 ml     Physical Exam  Awake Alert, Oriented X 3, No new F.N deficits, Normal affect [place].AT,PERRAL Supple Neck,, No cervical lymphadenopathy appriciated.  Symmetrical Chest wall movement, Good air movement bilaterally, CTAB RRR,No Gallops,Rubs or new Murmurs, No Parasternal Heave +ve B.Sounds, Abd Soft, mild tenderness to palpation, No organomegaly appriciated, No rebound - guarding or rigidity. No Cyanosis, Clubbing or edema, No new Rash or bruise    Data Review   Micro Results Recent Results (from the past 240 hour(s))  MRSA PCR Screening     Status: None   Collection Time: 07/31/14  8:44 AM  Result Value Ref Range Status   MRSA by PCR NEGATIVE NEGATIVE Final    Comment:        The GeneXpert MRSA Assay (FDA approved for NASAL specimens only), is one component of a comprehensive MRSA colonization surveillance program. It is not intended to diagnose MRSA infection nor to guide or monitor treatment for MRSA infections.     Radiology Reports Dg Chest 2 View  07/30/2014   CLINICAL DATA:  Initial evaluation for shortness of breath and upper abdominal pain for a while, worse lately, shortness of breath worse when walking or active, patient smokes  EXAM: CHEST  2 VIEW  COMPARISON:  04/25/2014  FINDINGS: Moderately severe cardiac enlargement. Mild to moderate vascular congestion. No pulmonary edema consolidation or effusion.  Spinal stimulator again identified.  IMPRESSION: Similar to prior study there is moderate cardiac enlargement and vascular congestion. No acute findings.   Electronically Signed   By: Skipper Cliche M.D.   On: 07/30/2014 18:32   US Abdomen Limited Ruq  07/31/2014   CLINICAL DATA:  Epigastric pain for 3 months. History of hepatitis-C, hypertension and pancreatitis.  EXAM: US ABDOMEN LIMITED - RIGHT UPPER QUADRANT  COMPARISON:  CT and ultrasound, 04/25/2014.  FINDINGS: Gallbladder:  No gallstones. Wall is thickened to 4.6 mm. No focal tenderness over the gallbladder.  Common bile duct:  Diameter: 4.9 mm.  No duct stone is seen.  Liver:  No focal lesion  identified. Within normal limits in parenchymal echogenicity.  IMPRESSION: 1. No gallstones. Nonspecific gallbladder wall thickening. No focal tenderness to transducer pressure over the gallbladder. Gallbladder wall thickening is likely reactive and may be from adjacent hepatic inflammation although the ultrasound appearance of the liver is unremarkable. 2. No other abnormalities.  No acute findings.   Electronically Signed   By: Lajean Manes M.D.   On: 07/31/2014 09:23    CBC  Recent Labs Lab 07/30/14 1750 07/31/14 0300  WBC 6.9 8.4  HGB 18.3* 18.1*  HCT 55.5* 55.0*  PLT 172 179  MCV 77.5* 78.2  MCH 25.6* 25.7*  MCHC 33.0 32.9  RDW 16.1* 15.9*    Chemistries   Recent Labs Lab 07/30/14 1750 07/31/14 0300  NA 134* 140  K 4.2 4.0  CL 102 103  CO2 24 27  GLUCOSE 117* 144*  BUN 16 14  CREATININE 1.33 1.37*  CALCIUM 8.9 9.1  AST 112* 159*  143*  ALT 182* 230*  226*  ALKPHOS 66 63  63  BILITOT 1.4* 1.7*  1.4*   ------------------------------------------------------------------------------------------------------------------ estimated creatinine clearance is 72.2 mL/min (by C-G formula based on Cr of 1.37). ------------------------------------------------------------------------------------------------------------------ No  results for input(s): HGBA1C in the last 72 hours. ------------------------------------------------------------------------------------------------------------------ No results for input(s): CHOL, HDL, LDLCALC, TRIG, CHOLHDL, LDLDIRECT in the last 72 hours. ------------------------------------------------------------------------------------------------------------------ No results for input(s): TSH, T4TOTAL, T3FREE, THYROIDAB in the last 72 hours.  Invalid input(s): FREET3 ------------------------------------------------------------------------------------------------------------------ No results for input(s): VITAMINB12, FOLATE, FERRITIN, TIBC, IRON, RETICCTPCT in the last 72 hours.  Coagulation profile  Recent Labs Lab 07/31/14 0300  INR 1.17    No results for input(s): DDIMER in the last 72 hours.  Cardiac Enzymes  Recent Labs Lab 07/31/14 0300 07/31/14 0923  TROPONINI 0.04* 0.04*   ------------------------------------------------------------------------------------------------------------------ Invalid input(s): POCBNP     Time Spent in minutes   30 minutes   Sadia Belfiore M.D on 07/31/2014 at 12:39 PM  Between 7am to 7pm - Pager - [phone]  After 7pm go to [email or web address] - password [email or web address]  And look for the night coverage person covering for me after hours  [facility] Office  [phone]   **Disclaimer: This note may have been dictated with voice recognition software. Similar sounding words can inadvertently be transcribed and this note may contain transcription errors which may not have been corrected upon publication of note.**

## 2014-07-31 NOTE — ED Notes (Signed)
Admitting at bedside 

## 2014-07-31 NOTE — Consult Note (Signed)
[facility] Consult: 12:49 PM 07/31/2014  LOS: 1 day    Referring Provider: Dr Roseanne Kaufman.   Primary Care Physician:  Antonietta Jewel, MD Primary Gastroenterologist:  Dr. Ardis Hughs.      Reason for Consultation:  Abdominal pain    HPI: Jerome Adams is a 54 y.o. male.  PMH of chronic pain, Hep C + (virus not detected per 12/2013 labs), HTN, GERD.  Hypertriglyceridemia. Spinal stenosis and DDD.  S/p spinal cord stimulator placement 2004.  S/p L spine fixation. Dysphagia since c-spine surgery 2013 with unrevealing EGD in 02/2014.  Adenomatous colon polyps in 2009. Hx alcoholic pancreatitis.   No show for GI appt 04/2014.  Has at least 6 months of abdominal bloating and discomfort/non-focal pain which makes it difficult to breath, consequently he gets SOB with just walking.  Intermittently worse. Treated for COPD with course of steroids in Sept 2015.  CT scan of 04/25/14 showed mild hepatomegaly, vascular calcifications in abdominal aorta and branch vessels, prostamegaly, 8 mm CBD, scattered colon tics.  Ultrasound 04/25/14 with 8 mm CBD, no gallstones, liver unremarkable.  LFTs normal in 03/2014.  Admitted 11/3 with abdominal pain leading to SOB/inablity to breath deeply. + weakness and sweats. LFTs elevated ( tbili 1.7, AST/ALT 143/226, alk phos normal). Lipase is 26.  BNP is elevated and lookd to have mild CHF on xray, echo is pending.     Repeat ultrasound of 07/31/14: Non specific GB wall thickening. Liver normal.   Pt was drinking 66 to 88 ounces of "Twisted Tea" (malt beverage) daily for several months, switched to drinking a bottled, premixed alcoholic egg nogg: 789 ML per day for about 5 days but quit all ETOH ~ 12/25 as it caused pain to worsen.  Additionally takes 4 oxycodone daily for back pain.  Has not been taking  PPI.     Past Medical History  Diagnosis Date  . Back pain   . PUD (peptic ulcer disease)   . Hypertension   . Allergy   . GERD (gastroesophageal reflux disease)     takes  otc  . Spinal cord stimulator status     has had for 1 yr--inserted by [facility]  . Sleep apnea     was tested here at [facility].Marland KitchenMarland KitchenMarland KitchenNever heard anymore about it (03/30/2013)  . Arthritis     "joints" (03/30/2013)  . Chronic lower back pain   . Depression   . Hepatitis C     "tested for it; never told I had it" (03/30/2013)  . Pancreatitis, acute 03/30/2013    Past Surgical History  Procedure Laterality Date  . Back surgery    . Wrist surgery Right 1980's    repair of tendons and nerves  . Foot surgery Right 2012?    "took piece off that was hurting me" (03/30/2013)  . Spinal cord stimulator implant  11/14/2012  . Anterior cervical decomp/discectomy fusion  04/05/2012    Procedure: ANTERIOR CERVICAL DECOMPRESSION/DISCECTOMY FUSION 1 LEVEL;  Surgeon: Elaina Hoops, MD;  Location: [facility];  Service: Neurosurgery;  Laterality: N/A;  Cervical five-six anterior cervical decompression and fusion  . Posterior lumbar fusion      "L4-5" (03/31/2103)  . Hardware removal      "took screw out of my back; it had damaged my nerve" (03/30/2013)  . Incision and drainage abscess Left     "lower jaw, tooth abscess" (03/30/2013)    Prior to Admission medications   Medication Sig Start Date End Date Taking? Authorizing Provider  albuterol (PROVENTIL HFA;VENTOLIN HFA) 108 (90 BASE) MCG/ACT inhaler Inhale 2-4 puffs into the lungs every 6 (six) hours as needed for wheezing or shortness of breath. 04/25/14  Yes Ernestina Patches, MD  diazepam (VALIUM) 5 MG tablet Take 5 mg by mouth daily as needed for anxiety.    Yes Historical Provider, MD  gemfibrozil (LOPID) 600 MG tablet Take 1 tablet (600 mg total) by mouth 2 (two) times daily before a meal. 04/07/13  Yes Dominic Pea, DO  losartan (COZAAR) 50 MG tablet Take 50 mg by mouth daily.   Yes  Historical Provider, MD  lovastatin (MEVACOR) 40 MG tablet Take 1 tablet (40 mg total) by mouth daily. 04/07/13  Yes Dominic Pea, DO  Multiple Vitamins-Minerals (MULTIVITAMIN PO) Take 1 tablet by mouth daily.   Yes Historical Provider, MD  omega-3 acid ethyl esters (LOVAZA) 1 G capsule Take 1 capsule (1 g total) by mouth 2 (two) times daily. 04/07/13  Yes Dominic Pea, DO  sertraline (ZOLOFT) 100 MG tablet Take 100 mg by mouth daily.   Yes Historical Provider, MD  testosterone cypionate (DEPOTESTOTERONE CYPIONATE) 200 MG/ML injection Inject 200 mg into the muscle once a week. Once per week   Yes Historical Provider, MD  furosemide (LASIX) 20 MG tablet Take 1 tablet (20 mg total) by mouth daily. 07/30/14   Fransico Meadow, PA-C  hydrochlorothiazide (HYDRODIURIL) 25 MG tablet Take 1 tablet (25 mg total) by mouth daily. 07/30/14   Fransico Meadow, PA-C  oxyCODONE-acetaminophen (PERCOCET) 5-325 MG per tablet Take 2 tablets by mouth every 4 (four) hours as needed. Patient not taking: Reported on 07/30/2014 04/25/14   Ernestina Patches, MD  oxyCODONE-acetaminophen (PERCOCET/ROXICET) 5-325 MG per tablet Take 2 tablets by mouth every 4 (four) hours as needed for moderate pain or severe pain. Patient not taking: Reported on 07/30/2014 03/19/14   Ernestina Patches, MD  predniSONE (DELTASONE) 10 MG tablet Take 4 tablets (40 mg total) by mouth daily. Patient not taking: Reported on 07/30/2014 04/25/14   Ernestina Patches, MD    Scheduled Meds: . folic acid  1 mg Oral Daily  . gemfibrozil  600 mg Oral BID AC  . heparin  5,000 Units Subcutaneous 3 times per day  . losartan  50 mg Oral Daily  . multivitamin with minerals  1 tablet Oral Daily  . omega-3 acid ethyl esters  1 g Oral BID  . pantoprazole  40 mg Oral Q1200  . [START ON 08/01/2014] pneumococcal 23 valent vaccine  0.5 mL Intramuscular Tomorrow-1000  . sertraline  100 mg Oral Daily  . sodium chloride  3 mL Intravenous Q12H  . sodium chloride  3 mL Intravenous Q12H  .  thiamine IV  100 mg Intravenous Daily   Infusions:   PRN Meds: sodium chloride, albuterol, diazepam, HYDROmorphone (DILAUDID) injection, ondansetron, oxyCODONE, sodium chloride   Allergies as of 07/30/2014 - Review Complete 07/30/2014  Allergen Reaction Noted  . Hydrocodone-acetaminophen Itching     Family History  Problem Relation Age of Onset  . Heart disease Father   . Heart  disease Sister   . Breast cancer Sister   . Bone cancer Mother   . Esophageal cancer Neg Hx   . Stomach cancer Neg Hx     History   Social History  . Marital Status: Divorced    Spouse Name: N/A    Number of Children: 1  . Years of Education: N/A   Occupational History  . UNEMPLOYEED     on disability.    Social History Main Topics  . Smoking status: Current Some Day Smoker -- 0.40 packs/day for 30 years    Types: Cigarettes  . Smokeless tobacco: Never Used     Comment: 8 cig per week  . Alcohol Use: 2.4 oz/week    4 Cans of beer per week     Comment: 1-2 beers/week. quit several months ago  . Drug Use: No     Comment: 03/30/2013 "[place]"  . Sexual Activity: Not on file   Other Topics Concern  . Not on file   Social History Narrative    REVIEW OF SYSTEMS: Constitutional:  No more than 5 pound weight gain within the last several months. ENT:  No nose bleeds Pulm:  Dyspnea on exertion. No cough. CV:  No palpitations, no LE edema.  GU:  No hematuria, no frequency GI:  Per history of present illness. Heme:  No unusual bleeding or bruising issues.   Transfusions:  None Neuro:  No headaches, no peripheral tingling or numbness Derm:  No itching, no rash or sores.  Endocrine:  No sweats or chills.  No polyuria or dysuria Immunization: Pneumovax ordered. Travel:  None beyond local counties in last few months.    PHYSICAL EXAM: Vital signs in last 24 hours: Filed Vitals:   07/31/14 0608  BP: 111/82  Pulse: 83  Temp: 97.8 F (36.6 C)  Resp: 17   Wt Readings from Last  3 Encounters:  07/31/14 209 lb 14.1 oz (95.2 kg)  04/25/14 208 lb (94.348 kg)  02/27/14 206 lb (93.441 kg)   GenePleasant, somewhat chronically ill AAM. Appears older than stated age. HeadNo facial asymmetry, swelling or signs of head trauma.  EyesNo scleral icterus or conjunctival pallor. EOMI. Ears:  No obvious hearing deficits.  NoseNo congestion or discharge. MoutSomewhat dry but clear oral mucosa. NeckNo JVD, no bruits, no TMG. Lung:  Anterior auscultation is clear.  Patient laying on nuclear medicine scanning table so posterior auscultation not performed. Heart:  RRR. No MRG. S1-S2 audible Abdomen:   Soft, non-protuberant. Hypoactive bowel sounds. No organomegaly. No bruits. Slightly tender in the epigastric region. No guarding or rebound.   Rectal:  deferred.   Musc/Skeletal:  no obvious joint contractures, swelling or deformity. Extremities:  no pedal edema. Neurologic:   no tremor. Able to move all 4 limbs. Alert and oriented 3. SkinNo telangiectasia, sores or rashes observed. Tattoos:   none Nodes:  No cervical adenopathy.   Psych:  Pleasant, relaxed.  Intake/Output from previous day:   Intake/Output this shift:    LAB RESULTS:  Recent Labs  07/30/14 1750 07/31/14 0300  WBC 6.9 8.4  HGB 18.3* 18.1*  HCT 55.5* 55.0*  PLT 172 179   BMET Lab Results  Component Value Date   NA 140 07/31/2014   NA 134* 07/30/2014   NA 137 04/25/2014   K 4.0 07/31/2014   K 4.2 07/30/2014   K 4.9 04/25/2014   CL 103 07/31/2014   CL 102 07/30/2014   CL 102 04/25/2014   CO2 27  07/31/2014   CO2 24 07/30/2014   CO2 22 04/25/2014   GLUCOSE 144* 07/31/2014   GLUCOSE 117* 07/30/2014   GLUCOSE 106* 04/25/2014   BUN 14 07/31/2014   BUN 16 07/30/2014   BUN 18 04/25/2014   CREATININE 1.37* 07/31/2014   CREATININE 1.33 07/30/2014   CREATININE 1.30 04/25/2014   CALCIUM 9.1 07/31/2014   CALCIUM 8.9 07/30/2014   CALCIUM 9.4 04/25/2014   LFT  Recent Labs  07/30/14 1750  07/31/14 0300  PROT 5.9* 6.2  6.4  ALBUMIN 3.6 3.7  3.7  AST 112* 159*  143*  ALT 182* 230*  226*  ALKPHOS 66 63  63  BILITOT 1.4* 1.7*  1.4*  BILIDIR  --  0.5*  IBILI  --  1.2*   PT/INR Lab Results  Component Value Date   INR 1.17 07/31/2014   INR 0.9 12/26/2007   Lipase     Component Value Date/Time   LIPASE 26 07/30/2014 1750    Drugs of Abuse     Component Value Date/Time   LABOPIA NEG 12/09/2012 1226   LABOPIA POSITIVE* 12/26/2007 1701   COCAINSCRNUR NEG 12/09/2012 1226   COCAINSCRNUR POSITIVE* 12/26/2007 1701   LABBENZ PPS 12/09/2012 1226   LABBENZ NONE DETECTED 12/26/2007 1701   LABBENZ NEG 03/09/2007 1244   AMPHETMU NEG 12/09/2012 1226   AMPHETMU NONE DETECTED 12/26/2007 1701   AMPHETMU NEG 03/09/2007 1244   THCU NEG 12/09/2012 1226   THCU NONE DETECTED 12/26/2007 1701   LABBARB NEG 12/09/2012 1226   LABBARB  12/26/2007 1701    NONE DETECTED        DRUG SCREEN FOR MEDICAL PURPOSES ONLY.  IF CONFIRMATION IS NEEDED FOR ANY PURPOSE, NOTIFY LAB WITHIN 5 DAYS.     RADIOLOGY STUDIES: Dg Chest 2 View  07/30/2014   CLINICAL DATA:  Initial evaluation for shortness of breath and upper abdominal pain for a while, worse lately, shortness of breath worse when walking or active, patient smokes  EXAM: CHEST  2 VIEW  COMPARISON:  04/25/2014  FINDINGS: Moderately severe cardiac enlargement. Mild to moderate vascular congestion. No pulmonary edema consolidation or effusion. Spinal stimulator again identified.  IMPRESSION: Similar to prior study there is moderate cardiac enlargement and vascular congestion. No acute findings.   Electronically Signed   By: Skipper Cliche M.D.   On: 07/30/2014 18:32   US Abdomen Limited Ruq  07/31/2014   CLINICAL DATA:  Epigastric pain for 3 months. History of hepatitis-C, hypertension and pancreatitis.  EXAM: US ABDOMEN LIMITED - RIGHT UPPER QUADRANT  COMPARISON:  CT and ultrasound, 04/25/2014.  FINDINGS: Gallbladder:  No gallstones. Wall  is thickened to 4.6 mm. No focal tenderness over the gallbladder.  Common bile duct:  Diameter: 4.9 mm.  No duct stone is seen.  Liver:  No focal lesion identified. Within normal limits in parenchymal echogenicity.  IMPRESSION: 1. No gallstones. Nonspecific gallbladder wall thickening. No focal tenderness to transducer pressure over the gallbladder. Gallbladder wall thickening is likely reactive and may be from adjacent hepatic inflammation although the ultrasound appearance of the liver is unremarkable. 2. No other abnormalities.  No acute findings.   Electronically Signed   By: Lajean Manes M.D.   On: 07/31/2014 09:23    ENDOSCOPIC STUDIES: 02/27/2014  EGD  For 2 year hx dysphagia.   Non-specific pan gastritis.  Small, soft pyloric channel nodule. Pathology: reactive gastropathy, no H pylori.   10/2011  Colonoscopy, screening study 1 sessile polyp at descending, one at transverse colon.  Pathology: tubular adenoma.   IMPRESSION:   *  Acute abdominal pain. Abnormal  LFTs.  Non-specific GB wall thickening. Hx hepatomegaly. Rule out GB disease.  Hx alcoholic pancreatitis. No current evidence for acute or chronic pancreatitis.   Non-specific gastritis on EGD 02/2014.  Non-compliant with PPI therapy.  ? Alcoholic gastritis vs PUD.   ? Narcotic bowel syndrome.   *  Alcoholism.  No ETOH for about 10 days.   *  Polycythemia.   *  Hep C +.  Never treated.  Undetectable virus 12/2013.    *  Chronic spinal pain. Chronic narcotics.    *  CHF.  Vascular congestion on xray, BNP elevated. Echo ordered.     PLAN:     *  HIDA scan is pending, with GB EF.  Not sure if this will be helpful.   *  Per Dr Fuller Plan.  *  Needs to remains abstinent from [place]  07/31/2014, 12:49 PM Pager: [phone]      Attending physician's note   I have taken a history, examined the patient and reviewed the chart. I agree with the Advanced Practitioner's note, impression and recommendations. Abdominal  pain and elevated LFTs. Abd US shows a thickened GB wall, normal CBD and no stones. ETOH abuse, chronic narcotic use and drug screen positive for cocaine. Continue PPI. Awaiting HIDA with EF.   Ladene Artist, MD [facility]

## 2014-07-31 NOTE — H&P (Addendum)
[facility] History and Physical  Jerome Adams PJK:[id number] DOB: 09/21/1960 DOA: 07/30/2014  Referring physician: ED physician PCP: Antonietta Jewel, MD  Specialists:   Chief Complaint: Abdominal pain and shortness of breath  HPI: Jerome Adams is a 54 y.o. male with past medical history of hypertension, hyperlipidemia, GERD, depression, hepatitis C, history of alcoholic pancreatitis, COPD, smoking, who presented with abdominal pain and shortness of breath.  Patient reports that he has chronic abdominal pain due to previous alcoholic pancreatitis. In the past 10 days, his abdominal pain has been worsening. The abdominal pain is diffuse and severe. It is worse on right upper quadrant. He does not have nausea, vomiting or diarrhea. Patient used to be a heavy drinker. He states that he quit drinking for a while, but drank alcohol in the holiday season. Last drinking was 3 weeks ago.  Patient also reports having shortness of breath in the past 10 days. He attributes his shortness of breath to abdominal pain which limits his breathing. He does not have chest pain, cough, fever or chills. He reports that he has been being weak and sweating a lot. Patient denies fever, chills, headaches, cough, chest pain, dysuria, urgency, frequency, hematuria, skin rashes, joint pain or leg swelling.  Work up in the ED demonstrates lipase 26, BNP 1191, elevated AST 112 and ALT 182, total bilirubin 1.4, ALP 66. No leukocytosis, but with the polycythemia with red cell 18.3 (previous erythropoietin elevated at 20.2 on 01/03/14). Chest x-ray is consistent with the vascular congestion. Patient is admitted to inpatient for further evaluation and treatment.  Review of Systems: As presented in the history of presenting illness, rest negative.  Where does patient live?  At home Can patient participate in ADLs? Yes  Allergy:  Allergies  Allergen Reactions  . Hydrocodone-Acetaminophen Itching    Past Medical  History  Diagnosis Date  . Back pain   . PUD (peptic ulcer disease)   . Hypertension   . Allergy   . GERD (gastroesophageal reflux disease)     takes  otc  . Spinal cord stimulator status     has had for 1 yr--inserted by [facility]  . Sleep apnea     was tested here at [facility].Marland KitchenMarland KitchenMarland KitchenNever heard anymore about it (03/30/2013)  . Arthritis     "joints" (03/30/2013)  . Chronic lower back pain   . Depression   . Hepatitis C     "tested for it; never told I had it" (03/30/2013)  . Pancreatitis, acute 03/30/2013    Past Surgical History  Procedure Laterality Date  . Back surgery    . Wrist surgery Right 1980's    repair of tendons and nerves  . Foot surgery Right 2012?    "took piece off that was hurting me" (03/30/2013)  . Spinal cord stimulator implant  11/14/2012  . Anterior cervical decomp/discectomy fusion  04/05/2012    Procedure: ANTERIOR CERVICAL DECOMPRESSION/DISCECTOMY FUSION 1 LEVEL;  Surgeon: Elaina Hoops, MD;  Location: [place] [facility];  Service: Neurosurgery;  Laterality: N/A;  Cervical five-six anterior cervical decompression and fusion  . Posterior lumbar fusion      "L4-5" (03/31/2103)  . Hardware removal      "took screw out of my back; it had damaged my nerve" (03/30/2013)  . Incision and drainage abscess Left     "lower jaw, tooth abscess" (03/30/2013)    Social History:  reports that he has been smoking Cigarettes.  He has a 12 pack-year smoking history. He has  never used smokeless tobacco. He reports that he drinks about 2.4 oz of alcohol per week. He reports that he does not use illicit drugs.  Family History:  Family History  Problem Relation Age of Onset  . Heart disease Father   . Heart disease Sister   . Breast cancer Sister   . Bone cancer Mother   . Esophageal cancer Neg Hx   . Stomach cancer Neg Hx      Prior to Admission medications   Medication Sig Start Date End Date Taking? Authorizing Provider  albuterol (PROVENTIL HFA;VENTOLIN HFA) 108 (90 BASE) MCG/ACT  inhaler Inhale 2-4 puffs into the lungs every 6 (six) hours as needed for wheezing or shortness of breath. 04/25/14  Yes Ernestina Patches, MD  diazepam (VALIUM) 5 MG tablet Take 5 mg by mouth daily as needed for anxiety.    Yes Historical Provider, MD  gemfibrozil (LOPID) 600 MG tablet Take 1 tablet (600 mg total) by mouth 2 (two) times daily before a meal. 04/07/13  Yes Dominic Pea, DO  losartan (COZAAR) 50 MG tablet Take 50 mg by mouth daily.   Yes Historical Provider, MD  lovastatin (MEVACOR) 40 MG tablet Take 1 tablet (40 mg total) by mouth daily. 04/07/13  Yes Dominic Pea, DO  Multiple Vitamins-Minerals (MULTIVITAMIN PO) Take 1 tablet by mouth daily.   Yes Historical Provider, MD  omega-3 acid ethyl esters (LOVAZA) 1 G capsule Take 1 capsule (1 g total) by mouth 2 (two) times daily. 04/07/13  Yes Dominic Pea, DO  sertraline (ZOLOFT) 100 MG tablet Take 100 mg by mouth daily.   Yes Historical Provider, MD  testosterone cypionate (DEPOTESTOTERONE CYPIONATE) 200 MG/ML injection Inject 200 mg into the muscle once a week. Once per week   Yes Historical Provider, MD  furosemide (LASIX) 20 MG tablet Take 1 tablet (20 mg total) by mouth daily. 07/30/14   Fransico Meadow, PA-C  hydrochlorothiazide (HYDRODIURIL) 25 MG tablet Take 1 tablet (25 mg total) by mouth daily. 07/30/14   Fransico Meadow, PA-C  oxyCODONE-acetaminophen (PERCOCET) 5-325 MG per tablet Take 2 tablets by mouth every 4 (four) hours as needed. Patient not taking: Reported on 07/30/2014 04/25/14   Ernestina Patches, MD  oxyCODONE-acetaminophen (PERCOCET/ROXICET) 5-325 MG per tablet Take 2 tablets by mouth every 4 (four) hours as needed for moderate pain or severe pain. Patient not taking: Reported on 07/30/2014 03/19/14   Ernestina Patches, MD  predniSONE (DELTASONE) 10 MG tablet Take 4 tablets (40 mg total) by mouth daily. Patient not taking: Reported on 07/30/2014 04/25/14   Ernestina Patches, MD    Physical Exam: Filed Vitals:   07/30/14 2200 07/30/14  2341 07/31/14 0100 07/31/14 0206  BP: 128/95  129/104 139/95  Pulse: 87 115  94  Temp:    97.8 F (36.6 C)  TempSrc:    Oral  Resp: 16  19 18   Height:    5\' 10"  (1.778 m)  Weight:    95.2 kg (209 lb 14.1 oz)  SpO2: 93% 90% 98% 100%   General: Not in acute distress HEENT:       Eyes: PERRL, EOMI, no scleral icterus       ENT: No discharge from the ears and nose, no pharynx injection, no tonsillar enlargement.        Neck: positive JVD, no bruit, no mass felt. Cardiac: S1/S2, RRR, No murmurs, No gallops or rubs Pulm: has fine crakles at the bases bilaterally and posteriorly, no wheezing or rhonchi.. Abd: Soft,  nondistended, nontender, no rebound pain, no organomegaly, BS present Ext: No edema bilaterally. 2+DP/PT pulse bilaterally Musculoskeletal: No joint deformities, erythema, or stiffness, ROM full Skin: No rashes.  Neuro: Alert and oriented X3, cranial nerves II-XII grossly intact, muscle strength 5/5 in all extremeties, sensation to light touch intact.  Psych: Patient is not psychotic, no suicidal or hemocidal ideation.  Labs on Admission:  Basic Metabolic Panel:  Recent Labs Lab 07/30/14 1750  NA 134*  K 4.2  CL 102  CO2 24  GLUCOSE 117*  BUN 16  CREATININE 1.33  CALCIUM 8.9   Liver Function Tests:  Recent Labs Lab 07/30/14 1750  AST 112*  ALT 182*  ALKPHOS 66  BILITOT 1.4*  PROT 5.9*  ALBUMIN 3.6    Recent Labs Lab 07/30/14 1750  LIPASE 26   No results for input(s): AMMONIA in the last 168 hours. CBC:  Recent Labs Lab 07/30/14 1750  WBC 6.9  HGB 18.3*  HCT 55.5*  MCV 77.5*  PLT 172   Cardiac Enzymes: No results for input(s): CKTOTAL, CKMB, CKMBINDEX, TROPONINI in the last 168 hours.  BNP (last 3 results) No results for input(s): PROBNP in the last 8760 hours. CBG: No results for input(s): GLUCAP in the last 168 hours.  Radiological Exams on Admission: Dg Chest 2 View  07/30/2014   CLINICAL DATA:  Initial evaluation for shortness of  breath and upper abdominal pain for a while, worse lately, shortness of breath worse when walking or active, patient smokes  EXAM: CHEST  2 VIEW  COMPARISON:  04/25/2014  FINDINGS: Moderately severe cardiac enlargement. Mild to moderate vascular congestion. No pulmonary edema consolidation or effusion. Spinal stimulator again identified.  IMPRESSION: Similar to prior study there is moderate cardiac enlargement and vascular congestion. No acute findings.   Electronically Signed   By: Skipper Cliche M.D.   On: 07/30/2014 18:32    EKG: Independently reviewed. Old first degree AV block  Assessment/Plan Principal Problem:   Abdominal pain Active Problems:   GERD   Essential hypertension, benign   HCV (hepatitis C virus)   Hyperlipidemia   History of pancreatitis   OSA (obstructive sleep apnea)   COPD (chronic obstructive pulmonary disease)   chf  Abdominal pain: Likely due to acute on chronic pancreatitis. Though patient's lipase is negative, it does not rule out this possibility. Patient drank alcohol recently, which may have contributed to his pancreatitis flare up. He has history of hepatitis C. Last viral load was negative on 12/28/13. Now, patient has elevated AST and ALT and TBR 1.4. thiis may be related to the congestion secondary to CHF exacerbation, but other possibility needs to be ruled out, including acute hepatitis and biliary system problem. Patient previous abdominal ultrasound on 04/25/14 showed common bile duct enlargement, indicating possible common bile duct obstruction. Another possibility is the side effects from medications, including lovastatin and Tylenol.  -will admit to tele bed given tachycardia -NPO and pain control (no tylenol) -HIDA -repeat hepatitis panel -D/C Percocet and tylenol -protonix -HIV ab -UDS  SOB: Likely due to CHF exacerbation. His previous 2-D echo on 12/30/07 showed EF of 45-50%. Though patient does not have any leg edema, his chest x-ray with  vascular congestion, positive JVD and elevated BNP are consistent with congestive heart failure exacerbation. -received 40 mg lasix in ED -will reassess in the AM to decide lasix dose -start ASA -Continue Cozaar -trop x 3  Hyperlipidemia: Patient is on lovastatin at home. LDL was 89 on 12/09/12 -Because  of elevated transaminases, will hold these medications  Hypertension:  -continue Cozaar  COPD: Patient does not have wheezing or rhonchi on auscultation. Does not seem to have COPD exacerbation. -Albuterol and DuoNeb nebulizers  GERD: -Protonix  DVT ppx: SQ Heparin      Code Status: Full code Family Communication: None at bed side.     Disposition Plan: Admit to inpatient   Date of Service 07/31/2014    Ivor Costa [facility] Pager [phone]  If 7PM-7AM, please contact night-coverage [email or web address] Password [email or web address] 07/31/2014, 2:10 AM

## 2014-08-01 ENCOUNTER — Encounter (HOSPITAL_COMMUNITY): Payer: Self-pay | Admitting: Nurse Practitioner

## 2014-08-01 DIAGNOSIS — I10 Essential (primary) hypertension: Secondary | ICD-10-CM

## 2014-08-01 DIAGNOSIS — R0602 Shortness of breath: Secondary | ICD-10-CM

## 2014-08-01 DIAGNOSIS — I369 Nonrheumatic tricuspid valve disorder, unspecified: Secondary | ICD-10-CM

## 2014-08-01 DIAGNOSIS — R109 Unspecified abdominal pain: Secondary | ICD-10-CM

## 2014-08-01 DIAGNOSIS — J439 Emphysema, unspecified: Secondary | ICD-10-CM

## 2014-08-01 DIAGNOSIS — I5021 Acute systolic (congestive) heart failure: Secondary | ICD-10-CM

## 2014-08-01 DIAGNOSIS — R0600 Dyspnea, unspecified: Secondary | ICD-10-CM

## 2014-08-01 DIAGNOSIS — I5023 Acute on chronic systolic (congestive) heart failure: Secondary | ICD-10-CM

## 2014-08-01 DIAGNOSIS — R1013 Epigastric pain: Secondary | ICD-10-CM

## 2014-08-01 LAB — HEPATIC FUNCTION PANEL
ALT: 185 U/L — ABNORMAL HIGH (ref 0–53)
AST: 96 U/L — ABNORMAL HIGH (ref 0–37)
Albumin: 3.3 g/dL — ABNORMAL LOW (ref 3.5–5.2)
Alkaline Phosphatase: 62 U/L (ref 39–117)
Bilirubin, Direct: 0.4 mg/dL — ABNORMAL HIGH (ref 0.0–0.3)
Indirect Bilirubin: 0.4 mg/dL (ref 0.3–0.9)
Total Bilirubin: 0.8 mg/dL (ref 0.3–1.2)
Total Protein: 5.6 g/dL — ABNORMAL LOW (ref 6.0–8.3)

## 2014-08-01 LAB — CBC
HCT: 56.8 % — ABNORMAL HIGH (ref 39.0–52.0)
Hemoglobin: 18.6 g/dL — ABNORMAL HIGH (ref 13.0–17.0)
MCH: 26.1 pg (ref 26.0–34.0)
MCHC: 32.7 g/dL (ref 30.0–36.0)
MCV: 79.6 fL (ref 78.0–100.0)
Platelets: 179 10*3/uL (ref 150–400)
RBC: 7.14 MIL/uL — ABNORMAL HIGH (ref 4.22–5.81)
RDW: 16.5 % — ABNORMAL HIGH (ref 11.5–15.5)
WBC: 7.6 10*3/uL (ref 4.0–10.5)

## 2014-08-01 LAB — BASIC METABOLIC PANEL
Anion gap: 10 (ref 5–15)
BUN: 15 mg/dL (ref 6–23)
CO2: 26 mmol/L (ref 19–32)
Calcium: 8.6 mg/dL (ref 8.4–10.5)
Chloride: 102 mEq/L (ref 96–112)
Creatinine, Ser: 1.26 mg/dL (ref 0.50–1.35)
GFR calc Af Amer: 74 mL/min — ABNORMAL LOW (ref >90)
GFR calc non Af Amer: 64 mL/min — ABNORMAL LOW (ref >90)
Glucose, Bld: 84 mg/dL (ref 70–99)
Potassium: 4.6 mmol/L (ref 3.5–5.1)
Sodium: 138 mmol/L (ref 135–145)

## 2014-08-01 LAB — GLUCOSE, CAPILLARY: Glucose-Capillary: 113 mg/dL — ABNORMAL HIGH (ref 70–99)

## 2014-08-01 MED ORDER — CARVEDILOL 3.125 MG PO TABS
3.1250 mg | ORAL_TABLET | Freq: Two times a day (BID) | ORAL | Status: DC
Start: 2014-08-01 — End: 2014-08-04
  Administered 2014-08-01 – 2014-08-04 (×6): 3.125 mg via ORAL
  Filled 2014-08-01 (×8): qty 1

## 2014-08-01 MED ORDER — METOPROLOL TARTRATE 12.5 MG HALF TABLET
12.5000 mg | ORAL_TABLET | Freq: Two times a day (BID) | ORAL | Status: DC
  Administered 2014-08-01: 12.5 mg via ORAL
  Filled 2014-08-01 (×3): qty 1

## 2014-08-01 MED ORDER — ASPIRIN EC 81 MG PO TBEC
81.0000 mg | DELAYED_RELEASE_TABLET | Freq: Every day | ORAL | Status: DC
  Administered 2014-08-01 – 2014-08-04 (×4): 81 mg via ORAL
  Filled 2014-08-01 (×5): qty 1

## 2014-08-01 MED ORDER — FUROSEMIDE 10 MG/ML IJ SOLN
80.0000 mg | Freq: Two times a day (BID) | INTRAMUSCULAR | Status: DC
  Administered 2014-08-02: 80 mg via INTRAVENOUS
  Filled 2014-08-01 (×2): qty 8

## 2014-08-01 MED ORDER — HYOSCYAMINE SULFATE 0.125 MG/ML PO SOLN: 0.2500 mg | Freq: Three times a day (TID) | ORAL | Status: DC

## 2014-08-01 MED ORDER — REGADENOSON 0.4 MG/5ML IV SOLN
0.4000 mg | Freq: Once | INTRAVENOUS | Status: AC
  Filled 2014-08-01: qty 5

## 2014-08-01 MED ORDER — HYOSCYAMINE SULFATE 0.125 MG SL SUBL
0.2500 mg | SUBLINGUAL_TABLET | Freq: Three times a day (TID) | SUBLINGUAL | Status: DC
  Administered 2014-08-01 – 2014-08-04 (×11): 0.25 mg via ORAL
  Filled 2014-08-01 (×16): qty 2

## 2014-08-01 MED ORDER — PANTOPRAZOLE SODIUM 40 MG PO TBEC
40.0000 mg | DELAYED_RELEASE_TABLET | Freq: Two times a day (BID) | ORAL | Status: DC
  Administered 2014-08-01 – 2014-08-04 (×6): 40 mg via ORAL
  Filled 2014-08-01 (×4): qty 1

## 2014-08-01 MED ORDER — DOCUSATE SODIUM 100 MG PO CAPS
200.0000 mg | ORAL_CAPSULE | Freq: Two times a day (BID) | ORAL | Status: DC
  Administered 2014-08-01 – 2014-08-04 (×5): 200 mg via ORAL
  Filled 2014-08-01 (×8): qty 2

## 2014-08-01 MED ORDER — FUROSEMIDE 10 MG/ML IJ SOLN
20.0000 mg | Freq: Two times a day (BID) | INTRAMUSCULAR | Status: DC
  Administered 2014-08-01: 20 mg via INTRAVENOUS

## 2014-08-01 MED ORDER — VITAMIN B-1 100 MG PO TABS
100.0000 mg | ORAL_TABLET | Freq: Every day | ORAL | Status: DC
  Administered 2014-08-02 – 2014-08-04 (×3): 100 mg via ORAL
  Filled 2014-08-01 (×4): qty 1

## 2014-08-01 MED ORDER — POTASSIUM CHLORIDE CRYS ER 20 MEQ PO TBCR
20.0000 meq | EXTENDED_RELEASE_TABLET | Freq: Every day | ORAL | Status: DC
  Administered 2014-08-01 – 2014-08-04 (×4): 20 meq via ORAL
  Filled 2014-08-01 (×6): qty 1

## 2014-08-01 NOTE — Consult Note (Signed)
CARDIOLOGY CONSULT NOTE   Patient ID: Jerome Adams MRN: [id number], DOB/AGE: 25-Sep-1960   Admit date: 07/30/2014 Date of Consult: 08/01/2014  Primary Physician: Antonietta Jewel, MD Primary Cardiologist: New to  - seen by J. Antonina Deziel, MD   Pt. Profile  54 y/o male w/o prior cardiac hx who was admitted on 1/4 with a 3+ month h/o progressive DOE, abdominal bloating, and abdominal discomfort and has been found to have an EF of 20-25 %.  Problem List  Past Medical History  Diagnosis Date  . Back pain   . PUD (peptic ulcer disease)   . Hypertension   . Allergy   . GERD (gastroesophageal reflux disease)     takes  otc  . Spinal cord stimulator status     has had for 1 yr--inserted by [facility]  . Sleep apnea     was tested here at [facility].Marland KitchenMarland KitchenMarland KitchenNever heard anymore about it (03/30/2013)  . Arthritis     "joints" (03/30/2013)  . Chronic lower back pain   . Depression   . Hepatitis C 2010    "tested for it; never told I had it" (03/30/2013)  . Pancreatitis, acute 03/30/2013  . Cardiomyopathy     a. 12/2007 Echo: EF 45-50%;  b. 07/2014 Echo: EF 20-25%, diff HK, mod dil LA, sev dil RA, mild TR, PASP 31 mmHg.    Past Surgical History  Procedure Laterality Date  . Back surgery    . Wrist surgery Right 1980's    repair of tendons and nerves  . Foot surgery Right 2012?    "took piece off that was hurting me" (03/30/2013)  . Spinal cord stimulator implant  11/14/2012  . Anterior cervical decomp/discectomy fusion  04/05/2012    Procedure: ANTERIOR CERVICAL DECOMPRESSION/DISCECTOMY FUSION 1 LEVEL;  Surgeon: Elaina Hoops, MD;  Location: [place] [facility];  Service: Neurosurgery;  Laterality: N/A;  Cervical five-six anterior cervical decompression and fusion  . Posterior lumbar fusion      "L4-5" (03/31/2103)  . Hardware removal      "took screw out of my back; it had damaged my nerve" (03/30/2013)  . Incision and drainage abscess Left     "lower jaw, tooth abscess" (03/30/2013)     Allergies  Allergies    Allergen Reactions  . Hydrocodone-Acetaminophen Itching   HPI   54 y/o male without a prior cardiac history.  He does have a h/o HTN, tobacco and ETOH abuse, remote drug abuse, Hepatitis C (never treated - undectable virus 12/2013) , and chronic pancreatitis.  In that setting he has had some degree of chronic abdominal discomfort and bloating.  In September of 2015, he was seen in [facility] ED with complaints of dyspnea and RUQ pain.  W/U was relatively unrevealing and it was felt that dyspnea represented COPD.  He was placed on a steroid taper and inhaler and discharged from the ED.  He says that since then, his respiratory status has steadily declined with progressive dyspnea on exertion, now occurring with minimal activity.  He has no prior h/o chest pain, pnd, orthopnea, edema, or early satiety.  As dyspnea has worsened however, his abd bloating and mid to left sided upper abd pain has worsened as well.  He thinks his wt is up about 10 lbs since September (per our records ~ 8 lbs since June).    Because of progressive dyspnea and abdominal discomfort, he presented to the ED on 12/3 for evaluation. There, ECG was notable for right axis deviation  and biatrial enlargement but otw was w/o acute st/t changes.  CXR showed vascular congestion and BNP was moderately elevated.  LFT's were elevated (AST 159, ALT 230 on 1/4), lipase was nl, and troponin has been minimally elevated as well (0.03  0.04  0.04.).  He was admitted and placed on IV lasix for CHF.  Abd U/S showed GB thickening.  HIDA scan was nl.  GI was consulted and Ss were felt to possibly be r/t IBS/MSK pain and/or aerophagia 2/2 CHF.  PPI and levsin were added.    Echo this morning showed EF of 20-25% with global HK and biatrial enlargement.  As a result, we have been asked to eval.  For this admission, he is net positive .84L and wt is relatively unchanged @ 209 lbs.   Inpatient Medications  . aspirin EC  81 mg Oral Daily  . folic acid  1 mg  Oral Daily  . furosemide  20 mg Intravenous BID  . gemfibrozil  600 mg Oral BID AC  . heparin  5,000 Units Subcutaneous 3 times per day  . hyoscyamine  0.25 mg Oral TID AC & HS  . losartan  50 mg Oral Daily  . metoprolol tartrate  12.5 mg Oral BID  . multivitamin with minerals  1 tablet Oral Daily  . omega-3 acid ethyl esters  1 g Oral BID  . pantoprazole  40 mg Oral BID AC  . potassium chloride  20 mEq Oral Daily  . sertraline  100 mg Oral Daily  . sodium chloride  3 mL Intravenous Q12H  . [START ON 08/02/2014] thiamine  100 mg Oral Daily    Family History Family History  Problem Relation Age of Onset  . Heart disease Father     H/o MI followed by SCD @ 8.  [name] Heart disease Sister     Died suddenly @ 57 - h/o CHF s/p AICD.  [name] Breast cancer Sister   . Bone cancer Mother   . Esophageal cancer Neg Hx   . Stomach cancer Neg Hx      Social History History   Social History  . Marital Status: Divorced    Spouse Name: N/A    Number of Children: 1  . Years of Education: N/A   Occupational History  . UNEMPLOYEED     on disability.    Social History Main Topics  . Smoking status: Current Some Day Smoker -- 0.50 packs/day for 40 years    Types: Cigarettes  . Smokeless tobacco: Never Used     Comment: 8 cig per week  . Alcohol Use: 2.4 oz/week    4 Cans of beer per week     Comment: Has been drinking since age 52.  Has averaged 4 22 oz cans of beer daily until late 2015.  . Drug Use: No     Comment: 03/30/2013 "[facility]"  . Sexual Activity: Not on file   Other Topics Concern  . Not on file   Social History Narrative   Lives in [place] by himself.  Does not routinely exercise.     Review of Systems  General:  No chills, fever, night sweats or weight changes.  Cardiovascular:  No chest pain, +++ dyspnea on exertion, no edema, orthopnea, palpitations, paroxysmal nocturnal dyspnea. Dermatological: No rash, lesions/masses Respiratory: No cough, +++ dyspnea Urologic:  No hematuria, dysuria Abdominal:   +++ nausea, no vomiting, intermittent constipation and diarrhea, no bright red blood per rectum, melena, or hematemesis Neurologic:  No visual changes, wkns, changes in mental status. All other systems reviewed and are otherwise negative except as noted above.  Physical Exam  Blood pressure 128/101, pulse 87, temperature 97.7 F (36.5 C), temperature source Oral, resp. rate 18, height 5\' 10"  (1.778 m), weight 209 lb 6.8 oz (94.995 kg), SpO2 100 %.  General: Pleasant, NAD Psych: Normal affect. Neuro: Alert and oriented X 3. Moves all extremities spontaneously. HEENT: Normal  Neck: Supple without bruits.  JVP ~ 15 cm. Lungs:  Resp regular and unlabored, CTA. Heart: RRR no s3, + s4, no murmurs. Abdomen: Soft,diffusely tender, non-distended, BS + x 4.  Extremities: No clubbing, cyanosis or edema. DP/PT/Radials 2+ and equal bilaterally.  Labs   Recent Labs  07/31/14 0300 07/31/14 0923 07/31/14 1627  TROPONINI 0.04* 0.04* 0.03   Lab Results  Component Value Date   WBC 7.6 08/01/2014   HGB 18.6* 08/01/2014   HCT 56.8* 08/01/2014   MCV 79.6 08/01/2014   PLT 179 08/01/2014    Recent Labs Lab 08/01/14 0340  NA 138  K 4.6  CL 102  CO2 26  BUN 15  CREATININE 1.26  CALCIUM 8.6  PROT 5.6*  BILITOT 0.8  ALKPHOS 62  ALT 185*  AST 96*  GLUCOSE 84   Lab Results  Component Value Date   CHOL 329* 03/29/2013   HDL 21* 03/29/2013   LDLCALC NOT CALC 03/29/2013   TRIG 410* 05/26/2013    Radiology/Studies  Dg Chest 2 View  07/30/2014   CLINICAL DATA:  Initial evaluation for shortness of breath and upper abdominal pain for a while, worse lately, shortness of breath worse when walking or active, patient smokes  EXAM: CHEST  2 VIEW  COMPARISON:  04/25/2014  FINDINGS: Moderately severe cardiac enlargement. Mild to moderate vascular congestion. No pulmonary edema consolidation or effusion. Spinal stimulator again identified.  IMPRESSION: Similar to  prior study there is moderate cardiac enlargement and vascular congestion. No acute findings.   Electronically Signed   By: Skipper Cliche M.D.   On: 07/30/2014 18:32   Nm Hepatobiliary Including Gb  07/31/2014   CLINICAL DATA:  Three month history of epigastric abdominal pain. Current history of hepatitis-C and hypertension.  EXAM: NUCLEAR MEDICINE HEPATOBILIARY IMAGING WITH GALLBLADDER EF  TECHNIQUE: Sequential images of the abdomen were obtained out to 60 minutes following intravenous administration of radiopharmaceutical. After slow intravenous infusion of 1.9 micrograms Cholecystokinin, gallbladder ejection fraction was determined.  RADIOPHARMACEUTICALS:  5.0 mCi Tc-62m Choletec  COMPARISON:  No prior nuclear imaging. Right upper quadrant abdominal ultrasound 07/31/1014.  FINDINGS: Hepatic uptake of Choletec is normal. Intra and extrahepatic bile ducts are visible within 15 min. Small bowel activity is visible within 55 min. Gallbladder activity is visible within 20 min, with normal filling of the gallbladder throughout the remainder of the imaging sequence. There is no significant tracer retention by the liver.  Gallbladder ejection fraction measured 83.2% at 30 min. At 30 min, normal ejection fraction is greater than 30%.  The patient did experience symptoms during CCK infusion. Symptoms included abdominal cramping.  IMPRESSION: 1. Normal hepatobiliary imaging and normal gallbladder ejection fraction. 2. The patient did experience abdominal cramping during CCK infusion.   Electronically Signed   By: Evangeline Dakin M.D.   On: 07/31/2014 15:41   US Abdomen Limited Ruq  07/31/2014   CLINICAL DATA:  Epigastric pain for 3 months. History of hepatitis-C, hypertension and pancreatitis.  EXAM: US ABDOMEN LIMITED - RIGHT UPPER QUADRANT  COMPARISON:  CT and  ultrasound, 04/25/2014.  FINDINGS: Gallbladder:  No gallstones. Wall is thickened to 4.6 mm. No focal tenderness over the gallbladder.  Common bile duct:   Diameter: 4.9 mm.  No duct stone is seen.  Liver:  No focal lesion identified. Within normal limits in parenchymal echogenicity.  IMPRESSION: 1. No gallstones. Nonspecific gallbladder wall thickening. No focal tenderness to transducer pressure over the gallbladder. Gallbladder wall thickening is likely reactive and may be from adjacent hepatic inflammation although the ultrasound appearance of the liver is unremarkable. 2. No other abnormalities.  No acute findings.   Electronically Signed   By: Lajean Manes M.D.   On: 07/31/2014 09:23   2D Echocardiogram 1.5.2016  Study Conclusions  - Left ventricle: The cavity size was normal. There was moderate   concentric hypertrophy. Systolic function was severely reduced.   The estimated ejection fraction was in the range of 20% to 25%.   Diffuse hypokinesis. The study is not technically sufficient to   allow evaluation of LV diastolic function. - Left atrium: Moderately dilated at 41 ml/m2. - Right ventricle: The cavity size was mildly dilated. Wall   thickness was normal. - Right atrium: Severely dilated at 32 cm2. - Tricuspid valve: There was mild regurgitation. - Pulmonary arteries: PA peak pressure: 31 mm Hg (S). _____________   ECG  RSR, 99, PVC, RAD, biatrial enlargement.  ASSESSMENT AND PLAN  1.  Acute Systolic CHF:  Pt presented with a 3 month h/o progressive DOE with abdominal bloating and discomfort.  He was found to have volume overload and now EF of 20-25% with global HK.  Suspect NICM in setting of long h/o ETOH abuse.  Still with volume overload on exam.  Increase IV diuresis to 80 mg BID.  Change bb to coreg. Cont ARB.  Will consider spiro if renal fxn remains stable.  Needs ischemic eval - will plan lexiscan MV tomorrow.  Discussed the importance of daily weights, sodium restriction, medication compliance, and symptom reporting.  Needs to stop drinking and he admits and accepts this.  2.  Acute on chronic abdominal pain:  Seen by  GI.  Imaging unrevealing.  Suspect this will improve with diuresis.  3.  HTN:  Follow with diuresis.  BB/ARB.  4.  HL/HTG:  ETOH cessation.  Now on lopid.  Signed, Murray Hodgkins, NP 08/01/2014, 4:35 PM  History and all data above reviewed.  Patient examined.  I agree with the findings as above.  The patient has had progressive dyspnea.  He is now found to have a cardiomyopathy.  He has no symptoms consistent with agnina. He has a history of ETOH abuse  The patient exam reveals COR:RRR  ,  Lungs: Clear  ,  Abd: Distended, Ext  Trace edema  .  All available labs, radiology testing, previous records reviewed. Agree with documented assessment and plan.  Cardiomyopathy:  I suspect this is related to ETOH use.  However, we will rule out ischemic heart disease with a Lexiscan Myoview.  I had a long discussion with the patient about avoiding ETOH, taking meds, weighing daily, salt and fluid restriction.  He thinks that he can do all of these things.  Increase diuresis today.  Change to Coreg.  Meds will be titrated as an outpatient.    Jeneen Rinks Tavish Gettis  5:29 PM  08/01/2014

## 2014-08-01 NOTE — Progress Notes (Signed)
[facility] PROGRESS NOTE  Jerome Adams QMV:[id number] DOB: 1961-03-17 DOA: 07/30/2014 PCP: Antonietta Jewel, MD  Assessment/Plan: 54 y/o male with PMH of HTN, HPL, Hep C, COPD, tobacco use, alcoholism, DJD/spinal stenosis, GERD presented with SOB, and abdominal pains   1. Acute on chronic CHF, systolic HF; echo: LVEF 28-41% (previous: 45-50%-2009); ? Cardiomyopathy due to etoh  -start IV diuresis; monitor I/O, daily weight; replace lytes, cont ARB, add BB;  cardiology eval for LHC r/o CAD  2. Abdominal pains of unclear etiology, chronic alcoholic hepatitis, pancreatitis;   US/HIDA: unremarkable; EGD (02/2014): gastritis -cont pain control, try to decrease opioids as possible; PPI, GI following; check iron profile, ceruloplasmin  3. COPD, no wheezing on exam; cont bronchodilators prn; counseled to stop smoking  4. Chronic pain on opioids; avoid overdose; pain mangement clinic referral upon discharge 5. Alcoholism likely causing cardiomyopathy, hepatitis, pancreatitis;   -denies h/o DTs; no s/s of withdrawals; cont monitor; counseled to stop etoh    Code Status: full Family Communication: d/w patient (indicate person spoken with, relationship, and if by phone, the number) Disposition Plan: home pend clinical improvement    Consultants:  GI  Cards    Procedures:  none  Antibiotics:  none (indicate start date, and stop date if known)  HPI/Subjective: alert  Objective: Filed Vitals:   08/01/14 0901  BP: 128/93  Pulse: 89  Temp: 97.4 F (36.3 C)  Resp: 18    Intake/Output Summary (Last 24 hours) at 08/01/14 1348 Last data filed at 08/01/14 0913  Gross per 24 hour  Intake   1200 ml  Output      0 ml  Net   1200 ml   Filed Weights   07/31/14 0206 07/31/14 0608 08/01/14 0615  Weight: 95.2 kg (209 lb 14.1 oz) 95.2 kg (209 lb 14.1 oz) 94.995 kg (209 lb 6.8 oz)    Exam:   General:  alert  Cardiovascular: s1,s2 rrr  Respiratory: CTA BL  Abdomen: soft,  mild epigastruc tender, no rebound   Musculoskeletal: no LE edema   Data Reviewed: Basic Metabolic Panel:  Recent Labs Lab 07/30/14 1750 07/31/14 0300 08/01/14 0340  NA 134* 140 138  K 4.2 4.0 4.6  CL 102 103 102  CO2 24 27 26   GLUCOSE 117* 144* 84  BUN 16 14 15   CREATININE 1.33 1.37* 1.26  CALCIUM 8.9 9.1 8.6   Liver Function Tests:  Recent Labs Lab 07/30/14 1750 07/31/14 0300 08/01/14 0340  AST 112* 159*  143* 96*  ALT 182* 230*  226* 185*  ALKPHOS 66 63  63 62  BILITOT 1.4* 1.7*  1.4* 0.8  PROT 5.9* 6.2  6.4 5.6*  ALBUMIN 3.6 3.7  3.7 3.3*    Recent Labs Lab 07/30/14 1750  LIPASE 26   No results for input(s): AMMONIA in the last 168 hours. CBC:  Recent Labs Lab 07/30/14 1750 07/31/14 0300 08/01/14 0340  WBC 6.9 8.4 7.6  HGB 18.3* 18.1* 18.6*  HCT 55.5* 55.0* 56.8*  MCV 77.5* 78.2 79.6  PLT 172 179 179   Cardiac Enzymes:  Recent Labs Lab 07/31/14 0300 07/31/14 0923 07/31/14 1627  TROPONINI 0.04* 0.04* 0.03   BNP (last 3 results) No results for input(s): PROBNP in the last 8760 hours. CBG:  Recent Labs Lab 07/31/14 0858 08/01/14 0600  GLUCAP 108* 113*    Recent Results (from the past 240 hour(s))  MRSA PCR Screening     Status: None   Collection Time: 07/31/14  8:44 AM  Result Value Ref Range Status   MRSA by PCR NEGATIVE NEGATIVE Final    Comment:        The GeneXpert MRSA Assay (FDA approved for NASAL specimens only), is one component of a comprehensive MRSA colonization surveillance program. It is not intended to diagnose MRSA infection nor to guide or monitor treatment for MRSA infections.      Studies: Dg Chest 2 View  07/30/2014   CLINICAL DATA:  Initial evaluation for shortness of breath and upper abdominal pain for a while, worse lately, shortness of breath worse when walking or active, patient smokes  EXAM: CHEST  2 VIEW  COMPARISON:  04/25/2014  FINDINGS: Moderately severe cardiac enlargement. Mild to  moderate vascular congestion. No pulmonary edema consolidation or effusion. Spinal stimulator again identified.  IMPRESSION: Similar to prior study there is moderate cardiac enlargement and vascular congestion. No acute findings.   Electronically Signed   By: Skipper Cliche M.D.   On: 07/30/2014 18:32   Nm Hepatobiliary Including Gb  07/31/2014   CLINICAL DATA:  Three month history of epigastric abdominal pain. Current history of hepatitis-C and hypertension.  EXAM: NUCLEAR MEDICINE HEPATOBILIARY IMAGING WITH GALLBLADDER EF  TECHNIQUE: Sequential images of the abdomen were obtained out to 60 minutes following intravenous administration of radiopharmaceutical. After slow intravenous infusion of 1.9 micrograms Cholecystokinin, gallbladder ejection fraction was determined.  RADIOPHARMACEUTICALS:  5.0 mCi Tc-6m Choletec  COMPARISON:  No prior nuclear imaging. Right upper quadrant abdominal ultrasound 07/31/1014.  FINDINGS: Hepatic uptake of Choletec is normal. Intra and extrahepatic bile ducts are visible within 15 min. Small bowel activity is visible within 55 min. Gallbladder activity is visible within 20 min, with normal filling of the gallbladder throughout the remainder of the imaging sequence. There is no significant tracer retention by the liver.  Gallbladder ejection fraction measured 83.2% at 30 min. At 30 min, normal ejection fraction is greater than 30%.  The patient did experience symptoms during CCK infusion. Symptoms included abdominal cramping.  IMPRESSION: 1. Normal hepatobiliary imaging and normal gallbladder ejection fraction. 2. The patient did experience abdominal cramping during CCK infusion.   Electronically Signed   By: Evangeline Dakin M.D.   On: 07/31/2014 15:41   US Abdomen Limited Ruq  07/31/2014   CLINICAL DATA:  Epigastric pain for 3 months. History of hepatitis-C, hypertension and pancreatitis.  EXAM: US ABDOMEN LIMITED - RIGHT UPPER QUADRANT  COMPARISON:  CT and ultrasound, 04/25/2014.   FINDINGS: Gallbladder:  No gallstones. Wall is thickened to 4.6 mm. No focal tenderness over the gallbladder.  Common bile duct:  Diameter: 4.9 mm.  No duct stone is seen.  Liver:  No focal lesion identified. Within normal limits in parenchymal echogenicity.  IMPRESSION: 1. No gallstones. Nonspecific gallbladder wall thickening. No focal tenderness to transducer pressure over the gallbladder. Gallbladder wall thickening is likely reactive and may be from adjacent hepatic inflammation although the ultrasound appearance of the liver is unremarkable. 2. No other abnormalities.  No acute findings.   Electronically Signed   By: Lajean Manes M.D.   On: 07/31/2014 09:23    Scheduled Meds: . aspirin EC  81 mg Oral Daily  . folic acid  1 mg Oral Daily  . gemfibrozil  600 mg Oral BID AC  . heparin  5,000 Units Subcutaneous 3 times per day  . losartan  50 mg Oral Daily  . multivitamin with minerals  1 tablet Oral Daily  . omega-3 acid ethyl esters  1 g Oral BID  .  pantoprazole  40 mg Oral BID AC  . sertraline  100 mg Oral Daily  . sodium chloride  3 mL Intravenous Q12H  . thiamine IV  100 mg Intravenous Daily  . [START ON 08/02/2014] thiamine  100 mg Oral Daily   Continuous Infusions:   Principal Problem:   Abdominal pain Active Problems:   GERD   Essential hypertension, benign   HCV (hepatitis C virus)   Hyperlipidemia   History of pancreatitis   OSA (obstructive sleep apnea)   COPD (chronic obstructive pulmonary disease)   chf    Time spent: >35 minutes     Kinnie Feil  [facility] Pager [phone]. If 7PM-7AM, please contact night-coverage at [email or web address], password [facility] 08/01/2014, 1:48 PM  LOS: 2 days

## 2014-08-01 NOTE — Progress Notes (Signed)
  Echocardiogram 2D Echocardiogram has been performed.  Jerome Adams [name] 08/01/2014, 9:45 AM

## 2014-08-01 NOTE — Progress Notes (Signed)
Daily Rounding Note  08/01/2014, 8:29 AM  LOS: 2 days   SUBJECTIVE:       Persistent bloating, fullness and pain in epigastrium.  No nausea.  Appetite diminshed and still with early satiety.  SOB. Formed BM and flatus yesterday.    OBJECTIVE:         Vital signs in last 24 hours:    Temp:  [97.3 F (36.3 C)-97.8 F (36.6 C)] 97.3 F (36.3 C) (01/05 0615) Pulse Rate:  [81-91] 88 (01/05 0615) Resp:  [18] 18 (01/05 0615) BP: (126-130)/(88-93) 128/88 mmHg (01/05 0615) SpO2:  [98 %-100 %] 100 % (01/05 0615) Weight:  [209 lb 6.8 oz (94.995 kg)] 209 lb 6.8 oz (94.995 kg) (01/05 0615) Last BM Date: 07/31/14 Filed Weights   07/31/14 0206 07/31/14 0608 08/01/14 0615  Weight: 209 lb 14.1 oz (95.2 kg) 209 lb 14.1 oz (95.2 kg) 209 lb 6.8 oz (94.995 kg)   General: looks well   Heart: RRR Chest: clear bil.  No dyspnea Abdomen: soft, epigastric tenderness  Extremities: no CCE Neuro/Psych:  Oriented x 3.  Alert. No gross deficits.   Intake/Output from previous day: 01/04 0701 - 01/05 0700 In: 720 [P.O.:720] Out: 0   Intake/Output this shift:    Lab Results:  Recent Labs  August 26, 2014 1750 07/31/14 0300 08/01/14 0340  WBC 6.9 8.4 7.6  HGB 18.3* 18.1* 18.6*  HCT 55.5* 55.0* 56.8*  PLT 172 179 179   BMET  Recent Labs  2014/08/26 1750 07/31/14 0300 08/01/14 0340  NA 134* 140 138  K 4.2 4.0 4.6  CL 102 103 102  CO2 24 27 26   GLUCOSE 117* 144* 84  BUN 16 14 15   CREATININE 1.33 1.37* 1.26  CALCIUM 8.9 9.1 8.6   LFT  Recent Labs  2014/08/26 1750 07/31/14 0300 08/01/14 0340  PROT 5.9* 6.2  6.4 5.6*  ALBUMIN 3.6 3.7  3.7 3.3*  AST 112* 159*  143* 96*  ALT 182* 230*  226* 185*  ALKPHOS 66 63  63 62  BILITOT 1.4* 1.7*  1.4* 0.8  BILIDIR  --  0.5* 0.4*  IBILI  --  1.2* 0.4   PT/INR  Recent Labs  07/31/14 0300  LABPROT 15.0  INR 1.17    Studies/Results: Dg Chest 2 View 08-26-2014   CLINICAL DATA:   Initial evaluation for shortness of breath and upper abdominal pain for a while, worse lately, shortness of breath worse when walking or active, patient smokes  EXAM: CHEST  2 VIEW  COMPARISON:  04/25/2014  FINDINGS: Moderately severe cardiac enlargement. Mild to moderate vascular congestion. No pulmonary edema consolidation or effusion. Spinal stimulator again identified.  IMPRESSION: Similar to prior study there is moderate cardiac enlargement and vascular congestion. No acute findings.   Electronically Signed   By: Skipper Cliche M.D.   On: 26-Aug-2014 18:32   Nm Hepatobiliary Including Gb 07/31/2014    RADIOPHARMACEUTICALS:  5.0 mCi Tc-79m Choletec  COMPARISON:  No prior nuclear imaging. Right upper quadrant abdominal ultrasound 07/31/1014.  FINDINGS: Hepatic uptake of Choletec is normal. Intra and extrahepatic bile ducts are visible within 15 min. Small bowel activity is visible within 55 min. Gallbladder activity is visible within 20 min, with normal filling of the gallbladder throughout the remainder of the imaging sequence. There is no significant tracer retention by the liver.  Gallbladder ejection fraction measured 83.2% at 30 min. At 30 min, normal ejection fraction is greater than 30%.  The patient did experience symptoms during CCK infusion. Symptoms included abdominal cramping.  IMPRESSION: 1. Normal hepatobiliary imaging and normal gallbladder ejection fraction. 2. The patient did experience abdominal cramping during CCK infusion.   Electronically Signed   By: Evangeline Dakin M.D.   On: 07/31/2014 15:41   US Abdomen Limited Ruq 07/31/2014   COMPARISON:  CT and ultrasound, 04/25/2014.  FINDINGS: Gallbladder:  No gallstones. Wall is thickened to 4.6 mm. No focal tenderness over the gallbladder.  Common bile duct:  Diameter: 4.9 mm.  No duct stone is seen.  Liver:  No focal lesion identified. Within normal limits in parenchymal echogenicity.  IMPRESSION: 1. No gallstones. Nonspecific gallbladder wall  thickening. No focal tenderness to transducer pressure over the gallbladder. Gallbladder wall thickening is likely reactive and may be from adjacent hepatic inflammation although the ultrasound appearance of the liver is unremarkable. 2. No other abnormalities.  No acute findings.   Electronically Signed   By: Lajean Manes M.D.   On: 07/31/2014 09:23   Scheduled Meds: . aspirin EC  81 mg Oral Daily  . folic acid  1 mg Oral Daily  . gemfibrozil  600 mg Oral BID AC  . heparin  5,000 Units Subcutaneous 3 times per day  . losartan  50 mg Oral Daily  . multivitamin with minerals  1 tablet Oral Daily  . omega-3 acid ethyl esters  1 g Oral BID  . pantoprazole  40 mg Oral Q1200  . pneumococcal 23 valent vaccine  0.5 mL Intramuscular Tomorrow-1000  . sertraline  100 mg Oral Daily  . sodium chloride  3 mL Intravenous Q12H  . thiamine IV  100 mg Intravenous Daily   Continuous Infusions:  PRN Meds:.sodium chloride, albuterol, diazepam, HYDROmorphone (DILAUDID) injection, ondansetron, oxyCODONE, sodium chloride   ASSESMENT:   *  Chronic abdominal pain and bloating.  Elevated LFTs (not in ETOH pattern): improved.Thickened, non-specific GB wall per [place]. Negative HIDA.  Hx ETOH pancreatitis, no evidence of this currently. Gastritis on EGD 02/2014 and not taking RXd PPI at home.  + ETOH abuse until 12/25.  Chronic narcotics for degenerative spinal disease.  sxs overall not improved, just better controlled with Oxycodone, Dilaudid.   *  CHF, echo shows EF 20-25%.  SOB associated with abdominal pain/pressure  *  Hypertension, diastolic.    PLAN   *  Per MD.      Azucena Freed  08/01/2014, 8:29 AM Pager: [phone]    Attending physician's note   I have taken an interval history, reviewed the chart and examined the patient. I agree with the Advanced Practitioner's note, impression and recommendations. Chronic abd pain, bloating, fullness, alternating diarrhea and constipation. DOE and SOB are  severely limiting him and his echo shows a worsening EF. EGD in 02/2014 showed only mild gastritis. CT in 03/2014 unremarkable. Abd [place] and HIDA as above. GI symptoms appear to be IBS and musculoskeletal. In addition he may have aerophagia from CHF, COPD related DOE/SOB. Add Levsin, continue Protonix daily. No further GI evaluation needed at this time. GI signing off. Outpatient GI follow up with Dr. Oretha Caprice as needed.  Pricilla Riffle. Fuller Plan, MD [facility]

## 2014-08-01 NOTE — Progress Notes (Signed)
Continues to complain of lower abdominal pain. Pt has been getting dilauded around the clock for pain. Pain is decreasing with medication, but does come back shortly after.

## 2014-08-02 ENCOUNTER — Inpatient Hospital Stay (HOSPITAL_COMMUNITY): Payer: Medicare Other

## 2014-08-02 ENCOUNTER — Encounter (HOSPITAL_COMMUNITY): Payer: Self-pay | Admitting: Physician Assistant

## 2014-08-02 DIAGNOSIS — I429 Cardiomyopathy, unspecified: Secondary | ICD-10-CM

## 2014-08-02 LAB — IRON AND TIBC
Iron: 90 ug/dL (ref 42–165)
Saturation Ratios: 23 % (ref 20–55)
TIBC: 390 ug/dL (ref 215–435)
UIBC: 300 ug/dL (ref 125–400)

## 2014-08-02 LAB — COMPREHENSIVE METABOLIC PANEL
ALT: 135 U/L — ABNORMAL HIGH (ref 0–53)
AST: 56 U/L — ABNORMAL HIGH (ref 0–37)
Albumin: 3.2 g/dL — ABNORMAL LOW (ref 3.5–5.2)
Alkaline Phosphatase: 54 U/L (ref 39–117)
Anion gap: 9 (ref 5–15)
BUN: 17 mg/dL (ref 6–23)
CO2: 26 mmol/L (ref 19–32)
Calcium: 8.7 mg/dL (ref 8.4–10.5)
Chloride: 104 mEq/L (ref 96–112)
Creatinine, Ser: 1.33 mg/dL (ref 0.50–1.35)
GFR calc Af Amer: 69 mL/min — ABNORMAL LOW (ref >90)
GFR calc non Af Amer: 60 mL/min — ABNORMAL LOW (ref >90)
Glucose, Bld: 82 mg/dL (ref 70–99)
Potassium: 4.4 mmol/L (ref 3.5–5.1)
Sodium: 139 mmol/L (ref 135–145)
Total Bilirubin: 1.1 mg/dL (ref 0.3–1.2)
Total Protein: 5.4 g/dL — ABNORMAL LOW (ref 6.0–8.3)

## 2014-08-02 LAB — GLUCOSE, CAPILLARY: Glucose-Capillary: 102 mg/dL — ABNORMAL HIGH (ref 70–99)

## 2014-08-02 LAB — FERRITIN: Ferritin: 148 ng/mL (ref 22–322)

## 2014-08-02 MED ORDER — HYDROMORPHONE HCL 1 MG/ML IJ SOLN
INTRAMUSCULAR | Status: AC
  Administered 2014-08-02: 1 mg
  Filled 2014-08-02: qty 1

## 2014-08-02 MED ORDER — TECHNETIUM TC 99M SESTAMIBI GENERIC - CARDIOLITE
30.0000 | Freq: Once | INTRAVENOUS | Status: AC | PRN
  Administered 2014-08-02: 30 via INTRAVENOUS

## 2014-08-02 MED ORDER — REGADENOSON 0.4 MG/5ML IV SOLN
0.4000 mg | Freq: Once | INTRAVENOUS | Status: AC
  Administered 2014-08-02: 0.4 mg via INTRAVENOUS

## 2014-08-02 MED ORDER — FUROSEMIDE 40 MG PO TABS
40.0000 mg | ORAL_TABLET | Freq: Two times a day (BID) | ORAL | Status: DC
  Administered 2014-08-02 – 2014-08-04 (×3): 40 mg via ORAL
  Filled 2014-08-02 (×7): qty 1

## 2014-08-02 MED ORDER — REGADENOSON 0.4 MG/5ML IV SOLN
INTRAVENOUS | Status: AC
  Filled 2014-08-02: qty 5

## 2014-08-02 MED ORDER — TECHNETIUM TC 99M SESTAMIBI GENERIC - CARDIOLITE
10.0000 | Freq: Once | INTRAVENOUS | Status: AC | PRN
  Administered 2014-08-02: 10 via INTRAVENOUS

## 2014-08-02 NOTE — Progress Notes (Addendum)
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 Abdominal pain -Abdominal ultrasound and HIDA scan negative for acute cholecystitis -LFTs trending down with diuresis -Likely due to hepatic congestion from the patient's CHF -Continues to improve clinically -appreciate GI followup-->suspect IBS, recommended Levsin Alcohol dependence  -no signs of alcohol withdrawal -initlal lipase 26 - likely contributing to the patient's cardiomyopathy  Tobacco abuse/COPD  -Tobacco cessation discussed  -No wheezing presently Chronic pain syndrome -try to wean IV opioids -will need out pain management referral Hep C antibody positive -12/28/13 HCV DNA negative Hypertension -Controlled -Continue losartan and carvedilol    Family Communication:   Pt at beside Disposition Plan:   Home when cleared by cardiology       Procedures/Studies: Dg Chest 2 View  07/30/2014   CLINICAL DATA:  Initial evaluation for shortness of breath and upper abdominal pain for a while, worse lately, shortness of breath worse when walking or active, patient smokes  EXAM: CHEST  2 VIEW  COMPARISON:  04/25/2014  FINDINGS: Moderately severe cardiac enlargement. Mild to moderate vascular congestion. No pulmonary edema consolidation or effusion. Spinal stimulator again identified.  IMPRESSION: Similar to prior study there is moderate cardiac enlargement and vascular congestion. No acute findings.   Electronically Signed   By: Skipper Cliche M.D.   On:  07/30/2014 18:32   Nm Hepatobiliary Including Gb  07/31/2014   CLINICAL DATA:  Three month history of epigastric abdominal pain. Current history of hepatitis-C and hypertension.  EXAM: NUCLEAR MEDICINE HEPATOBILIARY IMAGING WITH GALLBLADDER EF  TECHNIQUE: Sequential images of the abdomen were obtained out to 60 minutes following intravenous administration of radiopharmaceutical. After slow intravenous infusion of 1.9 micrograms Cholecystokinin, gallbladder ejection fraction was determined.  RADIOPHARMACEUTICALS:  5.0 mCi Tc-24m Choletec  COMPARISON:  No prior nuclear imaging. Right upper quadrant abdominal ultrasound 07/31/1014.  FINDINGS: Hepatic uptake of Choletec is normal. Intra and extrahepatic bile ducts are visible within 15 min. Small bowel activity is visible within 55 min. Gallbladder activity is visible within 20 min, with normal filling of the gallbladder throughout the remainder of the imaging sequence. There is no significant tracer retention by the liver.  Gallbladder ejection fraction measured 83.2% at 30 min. At 30 min, normal ejection fraction is greater than 30%.  The patient did experience symptoms during CCK infusion. Symptoms included abdominal cramping.  IMPRESSION: 1. Normal hepatobiliary imaging and normal gallbladder ejection fraction. 2. The patient did experience abdominal cramping during CCK infusion.   Electronically Signed   By: Evangeline Dakin M.D.   On: 07/31/2014 15:41   Nm Myocar Multi W/spect W/wall Motion / Ef  08/02/2014   CLINICAL DATA:  54 y/o male w/o prior cardiac hx who was admitted on 1/4 with a 3+ month h/o progressive DOE, abdominal bloating, and abdominal discomfort and has been found to have an EF of 20-25 %.  EXAM: MYOCARDIAL IMAGING WITH SPECT (REST AND PHARMACOLOGIC-STRESS)  GATED LEFT VENTRICULAR WALL MOTION STUDY  LEFT VENTRICULAR EJECTION FRACTION  TECHNIQUE: Standard myocardial SPECT imaging was performed after resting intravenous injection of 10 mCi Tc-66m  sestamibi. Subsequently, intravenous  infusion of Lexiscan was performed under the supervision of the Cardiology staff. At peak effect of the drug, 30 mCi Tc-3m sestamibi was injected intravenously and standard myocardial SPECT imaging was performed. Quantitative gated imaging was also performed to evaluate left ventricular wall motion, and estimate left ventricular ejection fraction.  COMPARISON:  None.  FINDINGS: Perfusion: There is a mild reduction in inferior wall tracer uptake, unchanged between rest and stress images. Otherwise there is uniform tracer uptake  Wall Motion: The left ventricle is dilated and has severe diffuse hypokinesis. The inferior wall contractility is similar to the remainder of the LV walls.  Left Ventricular Ejection Fraction: 24 %  End diastolic volume 381 ml  End systolic volume 829 ml  IMPRESSION:  1. No reversible ischemia or infarction. The inferior wall abnormality is most likely due to diaphragmatic attenuation artifact.  2.  Severe left ventricular diffuse hypokinesis.  3. Left ventricular ejection fraction 24%  4. High-risk stress test findings, due to severely reduced LV systolic function. The findings are more consistent with nonischemic cardiomyopathy, but cannot fully exclude "balanced" ischemia*.  *2012 Appropriate Use Criteria for Coronary Revascularization Focused Update: J Am Coll Cardiol. 9371;69(6):789-381. http://content.airportbarriers.com.aspx?articleid=1201161   Electronically Signed   By: Sanda Klein   On: 08/02/2014 13:10   US Abdomen Limited Ruq  07/31/2014   CLINICAL DATA:  Epigastric pain for 3 months. History of hepatitis-C, hypertension and pancreatitis.  EXAM: US ABDOMEN LIMITED - RIGHT UPPER QUADRANT  COMPARISON:  CT and ultrasound, 04/25/2014.  FINDINGS: Gallbladder:  No gallstones. Wall is thickened to 4.6 mm. No focal tenderness over the gallbladder.  Common bile duct:  Diameter: 4.9 mm.  No duct stone is seen.  Liver:  No focal lesion  identified. Within normal limits in parenchymal echogenicity.  IMPRESSION: 1. No gallstones. Nonspecific gallbladder wall thickening. No focal tenderness to transducer pressure over the gallbladder. Gallbladder wall thickening is likely reactive and may be from adjacent hepatic inflammation although the ultrasound appearance of the liver is unremarkable. 2. No other abnormalities.  No acute findings.   Electronically Signed   By: Lajean Manes M.D.   On: 07/31/2014 09:23         Subjective: Patient continues to complain of epigastric and right upper quadrant pain, but states that this is improving. He is able to tolerate his diet without any nausea, vomiting. Her shortness of breath is improved. He denies any chest pain, dizziness, headache, fevers, chills, diarrhea, hematochezia, melena.  Objective: Filed Vitals:   08/02/14 1104 08/02/14 1106 08/02/14 1108 08/02/14 1400  BP: 135/87 136/77 129/81 111/82  Pulse: 72 91 86 79  Temp:    97.5 F (36.4 C)  TempSrc:    Oral  Resp:    18  Height:      Weight:      SpO2:    97%    Intake/Output Summary (Last 24 hours) at 08/02/14 1744 Last data filed at 08/02/14 1500  Gross per 24 hour  Intake    480 ml  Output   3150 ml  Net  -2670 ml   Weight change: 0.578 kg (1 lb 4.4 oz) Exam:   General:  Pt is alert, follows commands appropriately, not in acute distress  HEENT: No icterus, No thrush,  [place]/AT  Cardiovascular: RRR, S1/S2, no rubs, no gallops  Respiratory: CTA bilaterally, no wheezing, no crackles, no rhonchi  Abdomen: Soft/+BS, epigastric and right upper quadrant pain without any peritoneal sign non distended, no guarding  Extremities: trace LE  edema, No lymphangitis, No petechiae, No rashes, no synovitis  Data Reviewed: Basic Metabolic Panel:  Recent Labs Lab 07/30/14 1750 07/31/14 0300 08/01/14 0340 08/02/14 0335  NA 134* 140 138 139  K 4.2 4.0 4.6 4.4  CL 102 103 102 104  CO2 24 27 26 26   GLUCOSE 117* 144* 84  82  BUN 16 14 15 17   CREATININE 1.33 1.37* 1.26 1.33  CALCIUM 8.9 9.1 8.6 8.7   Liver Function Tests:  Recent Labs Lab 07/30/14 1750 07/31/14 0300 08/01/14 0340 08/02/14 0335  AST 112* 159*  143* 96* 56*  ALT 182* 230*  226* 185* 135*  ALKPHOS 66 63  63 62 54  BILITOT 1.4* 1.7*  1.4* 0.8 1.1  PROT 5.9* 6.2  6.4 5.6* 5.4*  ALBUMIN 3.6 3.7  3.7 3.3* 3.2*    Recent Labs Lab 07/30/14 1750  LIPASE 26   No results for input(s): AMMONIA in the last 168 hours. CBC:  Recent Labs Lab 07/30/14 1750 07/31/14 0300 08/01/14 0340  WBC 6.9 8.4 7.6  HGB 18.3* 18.1* 18.6*  HCT 55.5* 55.0* 56.8*  MCV 77.5* 78.2 79.6  PLT 172 179 179   Cardiac Enzymes:  Recent Labs Lab 07/31/14 0300 07/31/14 0923 07/31/14 1627  TROPONINI 0.04* 0.04* 0.03   BNP: Invalid input(s): POCBNP CBG:  Recent Labs Lab 07/31/14 0858 08/01/14 0600 08/02/14 0609  GLUCAP 108* 113* 102*    Recent Results (from the past 240 hour(s))  MRSA PCR Screening     Status: None   Collection Time: 07/31/14  8:44 AM  Result Value Ref Range Status   MRSA by PCR NEGATIVE NEGATIVE Final    Comment:        The GeneXpert MRSA Assay (FDA approved for NASAL specimens only), is one component of a comprehensive MRSA colonization surveillance program. It is not intended to diagnose MRSA infection nor to guide or monitor treatment for MRSA infections.      Scheduled Meds: . aspirin EC  81 mg Oral Daily  . carvedilol  3.125 mg Oral BID WC  . docusate sodium  200 mg Oral BID  . folic acid  1 mg Oral Daily  . furosemide  40 mg Oral BID  . gemfibrozil  600 mg Oral BID AC  . heparin  5,000 Units Subcutaneous 3 times per day  . hyoscyamine  0.25 mg Oral TID AC & HS  . losartan  50 mg Oral Daily  . multivitamin with minerals  1 tablet Oral Daily  . omega-3 acid ethyl esters  1 g Oral BID  . pantoprazole  40 mg Oral BID AC  . potassium chloride  20 mEq Oral Daily  . regadenoson      . sertraline  100  mg Oral Daily  . sodium chloride  3 mL Intravenous Q12H  . thiamine  100 mg Oral Daily   Continuous Infusions:    Laurenashley Viar, DO  [facility] Pager [phone]  If 7PM-7AM, please contact night-coverage [email or web address] Password [email or web address] 08/02/2014, 5:44 PM   LOS: 3 days

## 2014-08-02 NOTE — Progress Notes (Signed)
Nuc results: IMPRESSION: 1. No reversible ischemia or infarction. The inferior wall abnormality is most likely due to diaphragmatic attenuation artifact. 2. Severe left ventricular diffuse hypokinesis. 3. Left ventricular ejection fraction 24% 4. High-risk stress test findings, due to severely reduced LV systolic function. The findings are more consistent with nonischemic cardiomyopathy, but cannot fully exclude "balanced" ischemia*.  D/w Dr. Harrington Challenger. She thinks at some point he may benefit from a cardiac cath but in the setting of abdominal pain, elevated LFTs, recent diuresis she initially recommended to consider this on an outpatient basis. However, I discussed the results with the patient and he expresses concern about deferring the case. If possible, he would rather proceed during this admission. I will make NPO after midnight and request that Dr. Harrington Challenger review to determine if this is appropriate.   Continue aspirin, Coreg, Losartan. Dr. Harrington Challenger recommends to send home on Lasix 40mg  BID and KCl 72meq daily. I have changed his Lasix dose to start this evening. Would NOT send home on HCTZ. It looks like someone already had sent in an rx for this several days ago but this will need to be cancelled at the pharmacy upon discharge. I notified primary team of result who would like to observe patient for today to continue diuresis.  Tentatively, I left a message on our [facility] office's scheduler voicemail requesting a follow-up appointment (1 week transition of care), and our office will call the patient with this information.  Ananda Caya PA-C

## 2014-08-02 NOTE — Progress Notes (Signed)
Patient Name: Jerome Adams Date of Encounter: 08/02/2014     Principal Problem:   Acute systolic CHF (congestive heart failure) Active Problems:   GERD   Essential hypertension, benign   HCV (hepatitis C virus)   Hyperlipidemia   Abdominal pain   History of pancreatitis   OSA (obstructive sleep apnea)   COPD (chronic obstructive pulmonary disease)   Abdominal pain of unknown etiology   SOB (shortness of breath)    SUBJECTIVE  Seen in nuclear medicine for NST. He tolerated this well. No CP or SOB. A little abdominal pain after he ate last night. Now resolved. He did have some right leg numbness, which is new. Thinks this is related to his bad back.    CURRENT MEDS . aspirin EC  81 mg Oral Daily  . carvedilol  3.125 mg Oral BID WC  . docusate sodium  200 mg Oral BID  . folic acid  1 mg Oral Daily  . furosemide  80 mg Intravenous BID  . gemfibrozil  600 mg Oral BID AC  . heparin  5,000 Units Subcutaneous 3 times per day  . hyoscyamine  0.25 mg Oral TID AC & HS  . losartan  50 mg Oral Daily  . multivitamin with minerals  1 tablet Oral Daily  . omega-3 acid ethyl esters  1 g Oral BID  . pantoprazole  40 mg Oral BID AC  . potassium chloride  20 mEq Oral Daily  . regadenoson      . sertraline  100 mg Oral Daily  . sodium chloride  3 mL Intravenous Q12H  . thiamine  100 mg Oral Daily    OBJECTIVE  Filed Vitals:   08/01/14 2037 08/02/14 0532 08/02/14 0825 08/02/14 1030  BP: 120/82 121/92 106/84 131/92  Pulse: 70 76 70 72  Temp: 98.2 F (36.8 C) 97.5 F (36.4 C) 97.4 F (36.3 C)   TempSrc: Oral Oral Oral   Resp: 18 18 18    Height:      Weight:  210 lb 11.2 oz (95.573 kg)    SpO2: 100% 99% 99%     Intake/Output Summary (Last 24 hours) at 08/02/14 1046 Last data filed at 08/02/14 0828  Gross per 24 hour  Intake    480 ml  Output   2050 ml  Net  -1570 ml   Filed Weights   07/31/14 0608 08/01/14 0615 08/02/14 0532  Weight: 209 lb 14.1 oz (95.2 kg) 209 lb  6.8 oz (94.995 kg) 210 lb 11.2 oz (95.573 kg)    PHYSICAL EXAM  General: Pleasant, NAD. Neuro: Alert and oriented X 3. Moves all extremities spontaneously. Psych: Normal affect. HEENT:  Normal  Neck: Supple without bruits or JVD. Lungs:  Resp regular and unlabored, CTA. Abd:  RUQ tenderness  LIver distended   Heart: RRR no s3, s4, or murmurs. Abdomen: Soft, non-tender, non-distended, BS + x 4.  Extremities: No clubbing, cyanosis or edema. DP/PT/Radials 2+ and equal bilaterally.  Accessory Clinical Findings  CBC  Recent Labs  07/31/14 0300 08/01/14 0340  WBC 8.4 7.6  HGB 18.1* 18.6*  HCT 55.0* 56.8*  MCV 78.2 79.6  PLT 179 384   Basic Metabolic Panel  Recent Labs  08/01/14 0340 08/02/14 0335  NA 138 139  K 4.6 4.4  CL 102 104  CO2 26 26  GLUCOSE 84 82  BUN 15 17  CREATININE 1.26 1.33  CALCIUM 8.6 8.7   Liver Function Tests  Recent Labs  08/01/14  0340 08/02/14 0335  AST 96* 56*  ALT 185* 135*  ALKPHOS 62 54  BILITOT 0.8 1.1  PROT 5.6* 5.4*  ALBUMIN 3.3* 3.2*    Recent Labs  07/30/14 1750  LIPASE 26   Cardiac Enzymes  Recent Labs  07/31/14 0300 07/31/14 0923 07/31/14 1627  TROPONINI 0.04* 0.04* 0.03     Radiology/Studies  Dg Chest 2 View  07/30/2014   CLINICAL DATA:  Initial evaluation for shortness of breath and upper abdominal pain for a while, worse lately, shortness of breath worse when walking or active, patient smokes  EXAM: CHEST  2 VIEW  COMPARISON:  04/25/2014  FINDINGS: Moderately severe cardiac enlargement. Mild to moderate vascular congestion. No pulmonary edema consolidation or effusion. Spinal stimulator again identified.  IMPRESSION: Similar to prior study there is moderate cardiac enlargement and vascular congestion. No acute findings.   Electronically Signed   By: Skipper Cliche M.D.   On: 07/30/2014 18:32   Nm Hepatobiliary Including Gb  07/31/2014   CLINICAL DATA:  Three month history of epigastric abdominal pain. Current  history of hepatitis-C and hypertension.  EXAM: NUCLEAR MEDICINE HEPATOBILIARY IMAGING WITH GALLBLADDER EF  TECHNIQUE: Sequential images of the abdomen were obtained out to 60 minutes following intravenous administration of radiopharmaceutical. After slow intravenous infusion of 1.9 micrograms Cholecystokinin, gallbladder ejection fraction was determined.  RADIOPHARMACEUTICALS:  5.0 mCi Tc-37m Choletec  COMPARISON:  No prior nuclear imaging. Right upper quadrant abdominal ultrasound 07/31/1014.  FINDINGS: Hepatic uptake of Choletec is normal. Intra and extrahepatic bile ducts are visible within 15 min. Small bowel activity is visible within 55 min. Gallbladder activity is visible within 20 min, with normal filling of the gallbladder throughout the remainder of the imaging sequence. There is no significant tracer retention by the liver.  Gallbladder ejection fraction measured 83.2% at 30 min. At 30 min, normal ejection fraction is greater than 30%.  The patient did experience symptoms during CCK infusion. Symptoms included abdominal cramping.  IMPRESSION: 1. Normal hepatobiliary imaging and normal gallbladder ejection fraction. 2. The patient did experience abdominal cramping during CCK infusion.   Electronically Signed   By: Evangeline Dakin M.D.   On: 07/31/2014 15:41   US Abdomen Limited Ruq  07/31/2014   CLINICAL DATA:  Epigastric pain for 3 months. History of hepatitis-C, hypertension and pancreatitis.  EXAM: US ABDOMEN LIMITED - RIGHT UPPER QUADRANT  COMPARISON:  CT and ultrasound, 04/25/2014.  FINDINGS: Gallbladder:  No gallstones. Wall is thickened to 4.6 mm. No focal tenderness over the gallbladder.  Common bile duct:  Diameter: 4.9 mm.  No duct stone is seen.  Liver:  No focal lesion identified. Within normal limits in parenchymal echogenicity.  IMPRESSION: 1. No gallstones. Nonspecific gallbladder wall thickening. No focal tenderness to transducer pressure over the gallbladder. Gallbladder wall thickening  is likely reactive and may be from adjacent hepatic inflammation although the ultrasound appearance of the liver is unremarkable. 2. No other abnormalities.  No acute findings.   Electronically Signed   By: Lajean Manes M.D.   On: 07/31/2014 09:23    2D Echocardiogram 1.5.2016 Study Conclusions - Left ventricle: The cavity size was normal. There was moderate concentric hypertrophy. Systolic function was severely reduced. The estimated ejection fraction was in the range of 20% to 25%. Diffuse hypokinesis. The study is not technically sufficient to allow evaluation of LV diastolic function. - Left atrium: Moderately dilated at 41 ml/m2. - Right ventricle: The cavity size was mildly dilated. Wall thickness was normal. - Right  atrium: Severely dilated at 32 cm2. - Tricuspid valve: There was mild regurgitation. - Pulmonary arteries: PA peak pressure: 31 mm Hg (S).   ASSESSMENT AND PLAN  54 y/o male with a history of HTN, tobacco/ETOH abuse, GERD, hep C, PUD, and chronic pancreatitis and no prior cardiac hx who was admitted on 07/31/14 with a 3+ month h/o progressive DOE, abdominal bloating, and abdominal discomfort and has been found to have an EF of 20-25 %.  Acute Systolic CHF: Pt presented with a 3 month h/o progressive DOE with abdominal bloating and epigastric  discomfort. He was found to have volume overload and now EF of 20-25% with global HK.  -- Suspect NICM in setting of long h/o ETOH abuse.  -- On IV Lasix 80mg  BID. Net neg -328ml. Weight acually up 1 lbs from yesterday. 209-->210lbs. However, patient with no SOB, orthopnea or PND. Continue diuretics at this same dosage. Creat stable 1.33. Continue to monitor.  -- Continue coreg and ARB.  -- Needs ischemic eval with newly reduced EF. Nuclear stress test today. --Discussed the importance of daily weights, sodium restriction, medication compliance, and symptom reporting.   Acute on chronic abdominal pain: Seen by GI.  Imaging unrevealing.Suspect this will improve with diuresis.  HTN: Follow with diuresis. BB/ARB.  HLD/HTG: ETOH cessation. Now on lopid.  EtoH abuse- Needs to stop drinking and he admits and accepts this.  Judy Pimple PA-C  Pager [phone] Patient seen and examined  Agree with findings as noted above by Kathlene November.  Volume status looks good (JVP normal LUngs CTA Cardiac RRR  Ext No edema  Does have + Liver edge and RUQ tenderness, prob from EtOH  Myoview is pending  Plans as noted above.    Dorris Carnes

## 2014-08-02 NOTE — Progress Notes (Addendum)
Lexiscan nuc completed without complication. VSS. Transient dyspnea that resolved 3 minutes after infusion. Await images.

## 2014-08-03 ENCOUNTER — Encounter (HOSPITAL_COMMUNITY)
Admission: EM | Disposition: A | Discharge status: Home / Self Care | Payer: Self-pay | Source: Home / Self Care | Attending: Internal Medicine

## 2014-08-03 ENCOUNTER — Ambulatory Visit (HOSPITAL_COMMUNITY): Admit: 2014-08-03 | Payer: Self-pay | Admitting: Cardiovascular Disease

## 2014-08-03 DIAGNOSIS — J438 Other emphysema: Secondary | ICD-10-CM

## 2014-08-03 DIAGNOSIS — I2510 Atherosclerotic heart disease of native coronary artery without angina pectoris: Secondary | ICD-10-CM

## 2014-08-03 HISTORY — PX: LEFT HEART CATHETERIZATION WITH CORONARY ANGIOGRAM: SHX5451

## 2014-08-03 LAB — GLUCOSE, CAPILLARY: Glucose-Capillary: 108 mg/dL — ABNORMAL HIGH (ref 70–99)

## 2014-08-03 LAB — COMPREHENSIVE METABOLIC PANEL
ALT: 109 U/L — ABNORMAL HIGH (ref 0–53)
AST: 40 U/L — ABNORMAL HIGH (ref 0–37)
Albumin: 3.4 g/dL — ABNORMAL LOW (ref 3.5–5.2)
Alkaline Phosphatase: 62 U/L (ref 39–117)
Anion gap: 9 (ref 5–15)
BUN: 17 mg/dL (ref 6–23)
CO2: 29 mmol/L (ref 19–32)
Calcium: 8.8 mg/dL (ref 8.4–10.5)
Chloride: 100 mEq/L (ref 96–112)
Creatinine, Ser: 1.32 mg/dL (ref 0.50–1.35)
GFR calc Af Amer: 70 mL/min — ABNORMAL LOW (ref >90)
GFR calc non Af Amer: 60 mL/min — ABNORMAL LOW (ref >90)
Glucose, Bld: 110 mg/dL — ABNORMAL HIGH (ref 70–99)
Potassium: 3.4 mmol/L — ABNORMAL LOW (ref 3.5–5.1)
Sodium: 138 mmol/L (ref 135–145)
Total Bilirubin: 0.9 mg/dL (ref 0.3–1.2)
Total Protein: 5.8 g/dL — ABNORMAL LOW (ref 6.0–8.3)

## 2014-08-03 LAB — CBC
HCT: 54.7 % — ABNORMAL HIGH (ref 39.0–52.0)
Hemoglobin: 18.4 g/dL — ABNORMAL HIGH (ref 13.0–17.0)
MCH: 25.7 pg — ABNORMAL LOW (ref 26.0–34.0)
MCHC: 33.6 g/dL (ref 30.0–36.0)
MCV: 76.5 fL — ABNORMAL LOW (ref 78.0–100.0)
Platelets: 168 10*3/uL (ref 150–400)
RBC: 7.15 MIL/uL — ABNORMAL HIGH (ref 4.22–5.81)
RDW: 15.6 % — ABNORMAL HIGH (ref 11.5–15.5)
WBC: 6.9 10*3/uL (ref 4.0–10.5)

## 2014-08-03 SURGERY — LEFT HEART CATHETERIZATION WITH CORONARY ANGIOGRAM
Anesthesia: LOCAL

## 2014-08-03 MED ORDER — SODIUM CHLORIDE 0.9 % IJ SOLN: 3.0000 mL | INTRAMUSCULAR | Status: DC | PRN

## 2014-08-03 MED ORDER — NITROGLYCERIN 1 MG/10 ML FOR IR/CATH LAB
INTRA_ARTERIAL | Status: AC
  Filled 2014-08-03: qty 10

## 2014-08-03 MED ORDER — VERAPAMIL HCL 2.5 MG/ML IV SOLN
INTRAVENOUS | Status: AC
Start: 2014-08-03 — End: 2014-08-03
  Filled 2014-08-03: qty 2

## 2014-08-03 MED ORDER — MIDAZOLAM HCL 2 MG/2ML IJ SOLN
INTRAMUSCULAR | Status: AC
  Filled 2014-08-03: qty 2

## 2014-08-03 MED ORDER — LIDOCAINE HCL (PF) 1 % IJ SOLN
INTRAMUSCULAR | Status: AC
  Filled 2014-08-03: qty 30

## 2014-08-03 MED ORDER — SODIUM CHLORIDE 0.9 % IV SOLN
INTRAVENOUS | Status: AC
  Administered 2014-08-03: 17:00:00 via INTRAVENOUS

## 2014-08-03 MED ORDER — HEPARIN SODIUM (PORCINE) 1000 UNIT/ML IJ SOLN
INTRAMUSCULAR | Status: AC
Start: 2014-08-03 — End: 2014-08-03
  Filled 2014-08-03: qty 1

## 2014-08-03 MED ORDER — SODIUM CHLORIDE 0.9 % IV SOLN: 250.0000 mL | INTRAVENOUS | Status: DC | PRN

## 2014-08-03 MED ORDER — OXYCODONE HCL 5 MG PO TABS
10.0000 mg | ORAL_TABLET | ORAL | Status: DC | PRN
  Administered 2014-08-04 (×3): 10 mg via ORAL
  Filled 2014-08-03 (×3): qty 2

## 2014-08-03 MED ORDER — FENTANYL CITRATE 0.05 MG/ML IJ SOLN
INTRAMUSCULAR | Status: AC
  Filled 2014-08-03: qty 2

## 2014-08-03 MED ORDER — POTASSIUM CHLORIDE 10 MEQ/100ML IV SOLN
10.0000 meq | INTRAVENOUS | Status: AC
  Administered 2014-08-03 (×2): 10 meq via INTRAVENOUS
  Filled 2014-08-03 (×2): qty 100

## 2014-08-03 MED ORDER — SODIUM CHLORIDE 0.9 % IJ SOLN: 3.0000 mL | Freq: Two times a day (BID) | INTRAMUSCULAR | Status: DC

## 2014-08-03 MED ORDER — HEPARIN (PORCINE) IN NACL 2-0.9 UNIT/ML-% IJ SOLN
INTRAMUSCULAR | Status: AC
  Filled 2014-08-03: qty 1000

## 2014-08-03 MED ORDER — ASPIRIN 81 MG PO CHEW: 81.0000 mg | CHEWABLE_TABLET | ORAL | Status: DC

## 2014-08-03 MED ORDER — SODIUM CHLORIDE 0.9 % IV SOLN: INTRAVENOUS | Status: DC

## 2014-08-03 NOTE — Progress Notes (Signed)
1700 received from cardiac cath TRB in place . With 13 cc air in place as marked. Kept R arm  Up above the heart. Pt amenable and cooperative as instructed pre op

## 2014-08-03 NOTE — Progress Notes (Signed)
PROGRESS NOTE  Jerome Adams PJK:[id number] DOB: Jan 11, 1961 DOA: 07/30/2014 PCP: Antonietta Jewel, MD  Assessment/Plan: Acute systolic CHF/NICM -80/99/8338 echo with EF 20-25% -Lexiscan nuclear medicine stress test negative for reversible ischemia, EF 24% -08/03/14 transitioned to po lasix -appreciate cardiology followup -Daily weights -Continue carvedilol and losartan -08/03/14 heart catheterization-minimal nonobstructive CAD-->med Rx for NICM -Patient was able to lay flat for the Lexiscan and cath -NICM likely due to Etoh Abdominal pain -Abdominal ultrasound and HIDA scan negative for acute cholecystitis -LFTs trending down with diuresis -Likely due to hepatic congestion from the patient's CHF -Continues to improve clinically -appreciate GI followup-->suspect IBS, recommended Levsin Alcohol dependence  -no signs of alcohol withdrawal -initlal lipase 26 - likely contributing to the patient's cardiomyopathy  Tobacco abuse/COPD  -Tobacco cessation discussed  -No wheezing presently Chronic pain syndrome -try to wean IV opioids -will need out pain management referral Hep C antibody positive -12/28/13 HCV DNA negative Hypertension -Controlled -Continue losartan and carvedilol    Family Communication: Pt at beside Disposition Plan: Home when cleared by cardiology         Procedures/Studies: Dg Chest 2 View  07/30/2014   CLINICAL DATA:  Initial evaluation for shortness of breath and upper abdominal pain for a while, worse lately, shortness of breath worse when walking or active, patient smokes  EXAM: CHEST  2 VIEW  COMPARISON:  04/25/2014  FINDINGS: Moderately severe cardiac enlargement. Mild to moderate vascular congestion. No pulmonary edema consolidation or effusion. Spinal stimulator again identified.  IMPRESSION: Similar to prior study there is moderate cardiac enlargement and vascular congestion. No acute findings.   Electronically Signed   By: Skipper Cliche M.D.   On: 07/30/2014 18:32   Nm Hepatobiliary Including Gb  07/31/2014   CLINICAL DATA:  Three month history of epigastric abdominal pain. Current history of hepatitis-C and hypertension.  EXAM: NUCLEAR MEDICINE HEPATOBILIARY IMAGING WITH GALLBLADDER EF  TECHNIQUE: Sequential images of the abdomen were obtained out to 60 minutes following intravenous administration of radiopharmaceutical. After slow intravenous infusion of 1.9 micrograms Cholecystokinin, gallbladder ejection fraction was determined.  RADIOPHARMACEUTICALS:  5.0 mCi Tc-73m Choletec  COMPARISON:  No prior nuclear imaging. Right upper quadrant abdominal ultrasound 07/31/1014.  FINDINGS: Hepatic uptake of Choletec is normal. Intra and extrahepatic bile ducts are visible within 15 min. Small bowel activity is visible within 55 min. Gallbladder activity is visible within 20 min, with normal filling of the gallbladder throughout the remainder of the imaging sequence. There is no significant tracer retention by the liver.  Gallbladder ejection fraction measured 83.2% at 30 min. At 30 min, normal ejection fraction is greater than 30%.  The patient did experience symptoms during CCK infusion. Symptoms included abdominal cramping.  IMPRESSION: 1. Normal hepatobiliary imaging and normal gallbladder ejection fraction. 2. The patient did experience abdominal cramping during CCK infusion.   Electronically Signed   By: Evangeline Dakin M.D.   On: 07/31/2014 15:41   Nm Myocar Multi W/spect W/wall Motion / Ef  08/02/2014   CLINICAL DATA:  54 y/o male w/o prior cardiac hx who was admitted on 1/4 with a 3+ month h/o progressive DOE, abdominal bloating, and abdominal discomfort and has been found to have an EF of 20-25 %.  EXAM: MYOCARDIAL IMAGING WITH SPECT (REST AND PHARMACOLOGIC-STRESS)  GATED LEFT VENTRICULAR WALL MOTION STUDY  LEFT VENTRICULAR EJECTION FRACTION  TECHNIQUE: Standard myocardial SPECT imaging was performed after resting intravenous injection  of 10 mCi Tc-30m sestamibi.  Subsequently, intravenous infusion of Lexiscan was performed under the supervision of the Cardiology staff. At peak effect of the drug, 30 mCi Tc-52m sestamibi was injected intravenously and standard myocardial SPECT imaging was performed. Quantitative gated imaging was also performed to evaluate left ventricular wall motion, and estimate left ventricular ejection fraction.  COMPARISON:  None.  FINDINGS: Perfusion: There is a mild reduction in inferior wall tracer uptake, unchanged between rest and stress images. Otherwise there is uniform tracer uptake  Wall Motion: The left ventricle is dilated and has severe diffuse hypokinesis. The inferior wall contractility is similar to the remainder of the LV walls.  Left Ventricular Ejection Fraction: 24 %  End diastolic volume 671 ml  End systolic volume 245 ml  IMPRESSION:  1. No reversible ischemia or infarction. The inferior wall abnormality is most likely due to diaphragmatic attenuation artifact.  2.  Severe left ventricular diffuse hypokinesis.  3. Left ventricular ejection fraction 24%  4. High-risk stress test findings, due to severely reduced LV systolic function. The findings are more consistent with nonischemic cardiomyopathy, but cannot fully exclude "balanced" ischemia*.  *2012 Appropriate Use Criteria for Coronary Revascularization Focused Update: J Am Coll Cardiol. 8099;83(3):825-053. http://content.airportbarriers.com.aspx?articleid=1201161   Electronically Signed   By: Sanda Klein   On: 08/02/2014 13:10   US Abdomen Limited Ruq  07/31/2014   CLINICAL DATA:  Epigastric pain for 3 months. History of hepatitis-C, hypertension and pancreatitis.  EXAM: US ABDOMEN LIMITED - RIGHT UPPER QUADRANT  COMPARISON:  CT and ultrasound, 04/25/2014.  FINDINGS: Gallbladder:  No gallstones. Wall is thickened to 4.6 mm. No focal tenderness over the gallbladder.  Common bile duct:  Diameter: 4.9 mm.  No duct stone is seen.  Liver:  No focal  lesion identified. Within normal limits in parenchymal echogenicity.  IMPRESSION: 1. No gallstones. Nonspecific gallbladder wall thickening. No focal tenderness to transducer pressure over the gallbladder. Gallbladder wall thickening is likely reactive and may be from adjacent hepatic inflammation although the ultrasound appearance of the liver is unremarkable. 2. No other abnormalities.  No acute findings.   Electronically Signed   By: Lajean Manes M.D.   On: 07/31/2014 09:23         Subjective: Patient denies fevers, chills, headache, chest pain, dyspnea, nausea, vomiting, diarrhea, abdominal pain, dysuria, hematuria   Objective: Filed Vitals:   08/03/14 1001 08/03/14 1411 08/03/14 1609 08/03/14 1729  BP: 109/90 110/77  121/91  Pulse: 74 70 81 72  Temp:  98.6 F (37 C)    TempSrc:  Oral    Resp: 18 18  16   Height:      Weight:      SpO2: 100% 98%  98%    Intake/Output Summary (Last 24 hours) at 08/03/14 1835 Last data filed at 08/03/14 1300  Gross per 24 hour  Intake    200 ml  Output   1400 ml  Net  -1200 ml   Weight change: -1.406 kg (-3 lb 1.6 oz) Exam:   General:  Pt is alert, follows commands appropriately, not in acute distress  HEENT: No icterus, No thrush,  [place]/AT  Cardiovascular: RRR, S1/S2, no rubs, no gallops  Respiratory: CTA bilaterally, no wheezing, no crackles, no rhonchi  Abdomen: Soft/+BS, non tender, non distended, no guarding  Extremities: trace LE edema, No lymphangitis, No petechiae, No rashes, no synovitis  Data Reviewed: Basic Metabolic Panel:  Recent Labs Lab 07/30/14 1750 07/31/14 0300 08/01/14 0340 08/02/14 0335 08/03/14 0527  NA 134* 140 138 139 138  K 4.2 4.0 4.6 4.4 3.4*  CL 102 103 102 104 100  CO2 24 27 26 26 29   GLUCOSE 117* 144* 84 82 110*  BUN 16 14 15 17 17   CREATININE 1.33 1.37* 1.26 1.33 1.32  CALCIUM 8.9 9.1 8.6 8.7 8.8   Liver Function Tests:  Recent Labs Lab 07/30/14 1750 07/31/14 0300 08/01/14 0340  08/02/14 0335 08/03/14 0527  AST 112* 159*  143* 96* 56* 40*  ALT 182* 230*  226* 185* 135* 109*  ALKPHOS 66 63  63 62 54 62  BILITOT 1.4* 1.7*  1.4* 0.8 1.1 0.9  PROT 5.9* 6.2  6.4 5.6* 5.4* 5.8*  ALBUMIN 3.6 3.7  3.7 3.3* 3.2* 3.4*    Recent Labs Lab 07/30/14 1750  LIPASE 26   No results for input(s): AMMONIA in the last 168 hours. CBC:  Recent Labs Lab 07/30/14 1750 07/31/14 0300 08/01/14 0340 08/03/14 0527  WBC 6.9 8.4 7.6 6.9  HGB 18.3* 18.1* 18.6* 18.4*  HCT 55.5* 55.0* 56.8* 54.7*  MCV 77.5* 78.2 79.6 76.5*  PLT 172 179 179 168   Cardiac Enzymes:  Recent Labs Lab 07/31/14 0300 07/31/14 0923 07/31/14 1627  TROPONINI 0.04* 0.04* 0.03   BNP: Invalid input(s): POCBNP CBG:  Recent Labs Lab 07/31/14 0858 08/01/14 0600 08/02/14 0609 08/03/14 0602  GLUCAP 108* 113* 102* 108*    Recent Results (from the past 240 hour(s))  MRSA PCR Screening     Status: None   Collection Time: 07/31/14  8:44 AM  Result Value Ref Range Status   MRSA by PCR NEGATIVE NEGATIVE Final    Comment:        The GeneXpert MRSA Assay (FDA approved for NASAL specimens only), is one component of a comprehensive MRSA colonization surveillance program. It is not intended to diagnose MRSA infection nor to guide or monitor treatment for MRSA infections.      Scheduled Meds: . [START ON 08/04/2014] aspirin  81 mg Oral Pre-Cath  . aspirin EC  81 mg Oral Daily  . carvedilol  3.125 mg Oral BID WC  . docusate sodium  200 mg Oral BID  . folic acid  1 mg Oral Daily  . furosemide  40 mg Oral BID  . gemfibrozil  600 mg Oral BID AC  . heparin  5,000 Units Subcutaneous 3 times per day  . hyoscyamine  0.25 mg Oral TID AC & HS  . losartan  50 mg Oral Daily  . multivitamin with minerals  1 tablet Oral Daily  . omega-3 acid ethyl esters  1 g Oral BID  . pantoprazole  40 mg Oral BID AC  . potassium chloride  20 mEq Oral Daily  . sertraline  100 mg Oral Daily  . sodium chloride  3  mL Intravenous Q12H  . sodium chloride  3 mL Intravenous Q12H  . thiamine  100 mg Oral Daily   Continuous Infusions: . [START ON 08/04/2014] sodium chloride    . sodium chloride 50 mL/hr at 08/03/14 1700     Ladene Allocca, DO  [facility] Pager [phone]  If 7PM-7AM, please contact night-coverage [email or web address] Password [email or web address] 08/03/2014, 6:35 PM   LOS: 4 days

## 2014-08-03 NOTE — Progress Notes (Signed)
All air removed from TR band approximately 2015. Vitals done after completion, all WNL.  Pressure dressing and restricted extremity bracelet applied. Site level 0, clean dry and intact. Patient educated on keeping arm elevated.  Will continue to monitor. Tresa Endo

## 2014-08-03 NOTE — CV Procedure (Addendum)
    Cardiac Catheterization Procedure Note  Name: Jerome Adams MRN: [id number] DOB: 17-Nov-1960  Procedure: Left Heart Cath, Selective Coronary Angiography  Indication: CHF, severe LV dysfunction   Procedural Details: The right wrist was prepped, draped, and anesthetized with 1% lidocaine. Using the modified Seldinger technique, a 5/6 French Slender sheath was introduced into the right radial artery. 3 mg of verapamil was administered through the sheath, weight-based unfractionated heparin was administered intravenously. Standard Judkins catheters were used for selective coronary angiography. Catheter exchanges were performed over an exchange length guidewire. There were no immediate procedural complications. A TR band was used for radial hemostasis at the completion of the procedure.  The patient was transferred to the post catheterization recovery area for further monitoring.  Procedural Findings: Hemodynamics: AO 102/78 LV 104/14  Coronary angiography: Coronary dominance: right  Left mainstem: Widely patent vessel without stenosis.   Left anterior descending (LAD): Mild diffuse LAD/diagonal stenosis throughout the mid vessel. Approximately 30% diffuse stenosis noted.There are 2 large diagonals with mild diffuse disease but no high-grade stenosis.   Left circumflex (LCx): Patent vessel without stenosis. Supplies 3 OM branches.   Right coronary artery (RCA): Large, dominant vessel without significant obstruction. Minor irregularity in the mid and distal vessel. The PDA is patent.   Left ventriculography: Deferred. LV assessed by echo  Estimated Blood Loss: minimal  Final Conclusions:   1. Mild nonobstructive CAD 2. Known severe LV systolic dysfunction  Recommendations: med Rx for nonobstructive CAD and nonischemic cardiomyopathy  [name] MD, [facility] 08/03/2014, 5:08 PM

## 2014-08-03 NOTE — H&P (View-Only) (Signed)
Subjective: "A little SOB at times"  No chest pain  Does have epigastric pain    Objective: Vital signs in last 24 hours: Temp:  [97.4 F (36.3 C)-97.5 F (36.4 C)] 97.4 F (36.3 C) (01/07 0515) Pulse Rate:  [70-91] 70 (01/07 0515) Resp:  [18] 18 (01/07 0515) BP: (106-136)/(72-92) 118/82 mmHg (01/07 0515) SpO2:  [97 %-100 %] 100 % (01/07 0515) Weight:  [207 lb 9.6 oz (94.167 kg)] 207 lb 9.6 oz (94.167 kg) (01/07 0515) Weight change: -3 lb 1.6 oz (-1.406 kg) Last BM Date: 07/31/14 Intake/Output from previous day: -3120  01/06 0701 - 01/07 0700 In: 480 [P.O.:480] Out: 3600 [Urine:3600] Intake/Output this shift:    PE: General:Pleasant affect, NAD Skin:Warm and dry, brisk capillary refill HEENT:normocephalic, sclera clear, mucus membranes moist Neck:supple, no JVD  Heart:S1S2 RRR without murmur, gallup, rub or click Lungs:clear without rales, rhonchi, or wheezes ZHG:DJME, non tender, + BS, do not palpate liver spleen or masses Ext:no to tr  lower ext edema, 2+ pedal pulses, 2+ radial pulses Neuro:alert and oriented X 3, MAE, follows commands, + facial symmetry  tele:  SR occ PVC  Lab Results:  Recent Labs  08/01/14 0340 08/03/14 0527  WBC 7.6 6.9  HGB 18.6* 18.4*  HCT 56.8* 54.7*  PLT 179 168   BMET  Recent Labs  08/02/14 0335 08/03/14 0527  NA 139 138  K 4.4 3.4*  CL 104 100  CO2 26 29  GLUCOSE 82 110*  BUN 17 17  CREATININE 1.33 1.32  CALCIUM 8.7 8.8    Recent Labs  07/31/14 1627  TROPONINI 0.03    Lab Results  Component Value Date   CHOL 329* 03/29/2013   HDL 21* 03/29/2013   LDLCALC NOT CALC 03/29/2013   LDLDIRECT 89 12/09/2012   TRIG 410* 05/26/2013   CHOLHDL 15.7 03/29/2013   Lab Results  Component Value Date   HGBA1C  12/26/2007    5.7 (NOTE)   The ADA recommends the following therapeutic goals for glycemic   control related to Hgb A1C measurement:   Goal of Therapy:   < 7.0% Hgb A1C   Action Suggested:  > 8.0% Hgb  A1C   Ref:  Diabetes Care, 22, Suppl. 1, 1999     Lab Results  Component Value Date   TSH 1.477 04/04/2013    Hepatic Function Panel  Recent Labs  08/01/14 0340  08/03/14 0527  PROT 5.6*  < > 5.8*  ALBUMIN 3.3*  < > 3.4*  AST 96*  < > 40*  ALT 185*  < > 109*  ALKPHOS 62  < > 62  BILITOT 0.8  < > 0.9  BILIDIR 0.4*  --   --   IBILI 0.4  --   --   < > = values in this interval not displayed. No results for input(s): CHOL in the last 72 hours. No results for input(s): PROTIME in the last 72 hours.     Studies/Results: Nm Myocar Multi W/spect W/wall Motion / Ef  08/02/2014   CLINICAL DATA:  54 y/o male w/o prior cardiac hx who was admitted on 1/4 with a 3+ month h/o progressive DOE, abdominal bloating, and abdominal discomfort and has been found to have an EF of 20-25 %.  EXAM: MYOCARDIAL IMAGING WITH SPECT (REST AND PHARMACOLOGIC-STRESS)  GATED LEFT VENTRICULAR WALL MOTION STUDY  LEFT VENTRICULAR EJECTION FRACTION  TECHNIQUE: Standard myocardial SPECT imaging was performed after resting intravenous injection of  10 mCi Tc-51m sestamibi. Subsequently, intravenous infusion of Lexiscan was performed under the supervision of the Cardiology staff. At peak effect of the drug, 30 mCi Tc-67m sestamibi was injected intravenously and standard myocardial SPECT imaging was performed. Quantitative gated imaging was also performed to evaluate left ventricular wall motion, and estimate left ventricular ejection fraction.  COMPARISON:  None.  FINDINGS: Perfusion: There is a mild reduction in inferior wall tracer uptake, unchanged between rest and stress images. Otherwise there is uniform tracer uptake  Wall Motion: The left ventricle is dilated and has severe diffuse hypokinesis. The inferior wall contractility is similar to the remainder of the LV walls.  Left Ventricular Ejection Fraction: 24 %  End diastolic volume 384 ml  End systolic volume 536 ml  IMPRESSION:  1. No reversible ischemia or infarction.  The inferior wall abnormality is most likely due to diaphragmatic attenuation artifact.  2.  Severe left ventricular diffuse hypokinesis.  3. Left ventricular ejection fraction 24%  4. High-risk stress test findings, due to severely reduced LV systolic function. The findings are more consistent with nonischemic cardiomyopathy, but cannot fully exclude "balanced" ischemia*.  *2012 Appropriate Use Criteria for Coronary Revascularization Focused Update: J Am Coll Cardiol. 4680;32(1):224-825. http://[email or web address]?articleid=[email or web address]   Electronically Signed   By: Sanda Klein   On: 08/02/2014 13:10    Medications: I have reviewed the patient's current medications. Scheduled Meds: . aspirin EC  81 mg Oral Daily  . carvedilol  3.125 mg Oral BID WC  . docusate sodium  200 mg Oral BID  . folic acid  1 mg Oral Daily  . furosemide  40 mg Oral BID  . gemfibrozil  600 mg Oral BID AC  . heparin  5,000 Units Subcutaneous 3 times per day  . hyoscyamine  0.25 mg Oral TID AC & HS  . losartan  50 mg Oral Daily  . multivitamin with minerals  1 tablet Oral Daily  . omega-3 acid ethyl esters  1 g Oral BID  . pantoprazole  40 mg Oral BID AC  . potassium chloride  20 mEq Oral Daily  . sertraline  100 mg Oral Daily  . sodium chloride  3 mL Intravenous Q12H  . thiamine  100 mg Oral Daily   Continuous Infusions:  PRN Meds:.sodium chloride, albuterol, diazepam, HYDROmorphone (DILAUDID) injection, ondansetron, oxyCODONE, sodium chloride  Assessment/Plan: 54 y/o male with a history of HTN, tobacco/ETOH abuse, GERD, hep C, PUD, and chronic pancreatitis and no prior cardiac hx who was admitted on 07/31/14 with a 3+ month h/o progressive DOE, abdominal bloating, and abdominal discomfort and has been found to have an EF of 20-25 %.  Acute Systolic CHF: Pt presented with a 3 month h/o progressive DOE with abdominal bloating and epigastric discomfort. He was found to have volume overload and now EF  of 20-25% with global HK. Net 3.5 L fluid since admit  Wt down 3 lbs   -- Suspect NICM in setting of long h/o ETOH abuse. Nuc study was negative for ischemia, EF 24% with nuc as well.  Balanced ischemia may not be ruled out.I have reviewed this with patient  He does not want anything missed that could potentially help heart pumping  Only way to do that would be with L heart catheterization  Described benefits, risks  He understands and wants to proceed.  --   Acute on chronic abdominal pain: Seen by GI. Imaging unrevealing.Patinet does have some liver inflammation  OIB:BCWUGQ    HLD/HTG: ETOH cessation. Now  on lopid.  EtoH abuse- Needs to stop drinking and he admits and accepts this.  Tobacco cessation:  Has been counseled   LOS: 4 days   Time spent with pt. :15 minutes. [facility] R  Nurse Practitioner Certified Pager [phone] or after 5pm and on weekends call [phone] 08/03/2014, 9:34 AM

## 2014-08-03 NOTE — Progress Notes (Signed)
Subjective: "A little SOB at times"  No chest pain  Does have epigastric pain    Objective: Vital signs in last 24 hours: Temp:  [97.4 F (36.3 C)-97.5 F (36.4 C)] 97.4 F (36.3 C) (01/07 0515) Pulse Rate:  [70-91] 70 (01/07 0515) Resp:  [18] 18 (01/07 0515) BP: (106-136)/(72-92) 118/82 mmHg (01/07 0515) SpO2:  [97 %-100 %] 100 % (01/07 0515) Weight:  [207 lb 9.6 oz (94.167 kg)] 207 lb 9.6 oz (94.167 kg) (01/07 0515) Weight change: -3 lb 1.6 oz (-1.406 kg) Last BM Date: 07/31/14 Intake/Output from previous day: -3120  01/06 0701 - 01/07 0700 In: 480 [P.O.:480] Out: 3600 [Urine:3600] Intake/Output this shift:    PE: General:Pleasant affect, NAD Skin:Warm and dry, brisk capillary refill HEENT:normocephalic, sclera clear, mucus membranes moist Neck:supple, no JVD  Heart:S1S2 RRR without murmur, gallup, rub or click Lungs:clear without rales, rhonchi, or wheezes TWS:FKCL, non tender, + BS, do not palpate liver spleen or masses Ext:no to tr  lower ext edema, 2+ pedal pulses, 2+ radial pulses Neuro:alert and oriented X 3, MAE, follows commands, + facial symmetry  tele:  SR occ PVC  Lab Results:  Recent Labs  08/01/14 0340 08/03/14 0527  WBC 7.6 6.9  HGB 18.6* 18.4*  HCT 56.8* 54.7*  PLT 179 168   BMET  Recent Labs  08/02/14 0335 08/03/14 0527  NA 139 138  K 4.4 3.4*  CL 104 100  CO2 26 29  GLUCOSE 82 110*  BUN 17 17  CREATININE 1.33 1.32  CALCIUM 8.7 8.8    Recent Labs  07/31/14 1627  TROPONINI 0.03    Lab Results  Component Value Date   CHOL 329* 03/29/2013   HDL 21* 03/29/2013   LDLCALC NOT CALC 03/29/2013   LDLDIRECT 89 12/09/2012   TRIG 410* 05/26/2013   CHOLHDL 15.7 03/29/2013   Lab Results  Component Value Date   HGBA1C  12/26/2007    5.7 (NOTE)   The ADA recommends the following therapeutic goals for glycemic   control related to Hgb A1C measurement:   Goal of Therapy:   < 7.0% Hgb A1C   Action Suggested:  > 8.0% Hgb  A1C   Ref:  Diabetes Care, 22, Suppl. 1, 1999     Lab Results  Component Value Date   TSH 1.477 04/04/2013    Hepatic Function Panel  Recent Labs  08/01/14 0340  08/03/14 0527  PROT 5.6*  < > 5.8*  ALBUMIN 3.3*  < > 3.4*  AST 96*  < > 40*  ALT 185*  < > 109*  ALKPHOS 62  < > 62  BILITOT 0.8  < > 0.9  BILIDIR 0.4*  --   --   IBILI 0.4  --   --   < > = values in this interval not displayed. No results for input(s): CHOL in the last 72 hours. No results for input(s): PROTIME in the last 72 hours.     Studies/Results: Nm Myocar Multi W/spect W/wall Motion / Ef  08/02/2014   CLINICAL DATA:  54 y/o male w/o prior cardiac hx who was admitted on 1/4 with a 3+ month h/o progressive DOE, abdominal bloating, and abdominal discomfort and has been found to have an EF of 20-25 %.  EXAM: MYOCARDIAL IMAGING WITH SPECT (REST AND PHARMACOLOGIC-STRESS)  GATED LEFT VENTRICULAR WALL MOTION STUDY  LEFT VENTRICULAR EJECTION FRACTION  TECHNIQUE: Standard myocardial SPECT imaging was performed after resting intravenous injection of  10 mCi Tc-5m sestamibi. Subsequently, intravenous infusion of Lexiscan was performed under the supervision of the Cardiology staff. At peak effect of the drug, 30 mCi Tc-60m sestamibi was injected intravenously and standard myocardial SPECT imaging was performed. Quantitative gated imaging was also performed to evaluate left ventricular wall motion, and estimate left ventricular ejection fraction.  COMPARISON:  None.  FINDINGS: Perfusion: There is a mild reduction in inferior wall tracer uptake, unchanged between rest and stress images. Otherwise there is uniform tracer uptake  Wall Motion: The left ventricle is dilated and has severe diffuse hypokinesis. The inferior wall contractility is similar to the remainder of the LV walls.  Left Ventricular Ejection Fraction: 24 %  End diastolic volume 811 ml  End systolic volume 914 ml  IMPRESSION:  1. No reversible ischemia or infarction.  The inferior wall abnormality is most likely due to diaphragmatic attenuation artifact.  2.  Severe left ventricular diffuse hypokinesis.  3. Left ventricular ejection fraction 24%  4. High-risk stress test findings, due to severely reduced LV systolic function. The findings are more consistent with nonischemic cardiomyopathy, but cannot fully exclude "balanced" ischemia*.  *2012 Appropriate Use Criteria for Coronary Revascularization Focused Update: J Am Coll Cardiol. 7829;56(2):130-865. http://[email or web address]?articleid=[email or web address]   Electronically Signed   By: Sanda Klein   On: 08/02/2014 13:10    Medications: I have reviewed the patient's current medications. Scheduled Meds: . aspirin EC  81 mg Oral Daily  . carvedilol  3.125 mg Oral BID WC  . docusate sodium  200 mg Oral BID  . folic acid  1 mg Oral Daily  . furosemide  40 mg Oral BID  . gemfibrozil  600 mg Oral BID AC  . heparin  5,000 Units Subcutaneous 3 times per day  . hyoscyamine  0.25 mg Oral TID AC & HS  . losartan  50 mg Oral Daily  . multivitamin with minerals  1 tablet Oral Daily  . omega-3 acid ethyl esters  1 g Oral BID  . pantoprazole  40 mg Oral BID AC  . potassium chloride  20 mEq Oral Daily  . sertraline  100 mg Oral Daily  . sodium chloride  3 mL Intravenous Q12H  . thiamine  100 mg Oral Daily   Continuous Infusions:  PRN Meds:.sodium chloride, albuterol, diazepam, HYDROmorphone (DILAUDID) injection, ondansetron, oxyCODONE, sodium chloride  Assessment/Plan: 54 y/o male with a history of HTN, tobacco/ETOH abuse, GERD, hep C, PUD, and chronic pancreatitis and no prior cardiac hx who was admitted on 07/31/14 with a 3+ month h/o progressive DOE, abdominal bloating, and abdominal discomfort and has been found to have an EF of 20-25 %.  Acute Systolic CHF: Pt presented with a 3 month h/o progressive DOE with abdominal bloating and epigastric discomfort. He was found to have volume overload and now EF  of 20-25% with global HK. Net 3.5 L fluid since admit  Wt down 3 lbs   -- Suspect NICM in setting of long h/o ETOH abuse. Nuc study was negative for ischemia, EF 24% with nuc as well.  Balanced ischemia may not be ruled out.I have reviewed this with patient  He does not want anything missed that could potentially help heart pumping  Only way to do that would be with L heart catheterization  Described benefits, risks  He understands and wants to proceed.  --   Acute on chronic abdominal pain: Seen by GI. Imaging unrevealing.Patinet does have some liver inflammation  HQI:ONGEXB    HLD/HTG: ETOH cessation. Now  on lopid.  EtoH abuse- Needs to stop drinking and he admits and accepts this.  Tobacco cessation:  Has been counseled   LOS: 4 days   Time spent with pt. :15 minutes. [facility] R  Nurse Practitioner Certified Pager [phone] or after 5pm and on weekends call [phone] 08/03/2014, 9:34 AM

## 2014-08-03 NOTE — Interval H&P Note (Signed)
History and Physical Interval Note:  08/03/2014 4:21 PM  Jerome Adams  has presented today for surgery, with the diagnosis of cp  The various methods of treatment have been discussed with the patient and family. After consideration of risks, benefits and other options for treatment, the patient has consented to  Procedure(s): LEFT HEART CATHETERIZATION WITH CORONARY ANGIOGRAM (N/A) as a surgical intervention .  The patient's history has been reviewed, patient examined, no change in status, stable for surgery.  I have reviewed the patient's chart and labs.  Questions were answered to the patient's satisfaction.    Cath Lab Visit (complete for each Cath Lab visit)  Clinical Evaluation Leading to the Procedure:   ACS: No.  Non-ACS:    Anginal Classification: No Symptoms  Anti-ischemic medical therapy: Minimal Therapy (1 class of medications)  Non-Invasive Test Results: High-risk stress test findings: cardiac mortality >3%/year  Prior CABG: No previous CABG       Jerome Adams

## 2014-08-04 ENCOUNTER — Telehealth: Payer: Self-pay | Admitting: Cardiology

## 2014-08-04 ENCOUNTER — Encounter (HOSPITAL_COMMUNITY): Payer: Self-pay | Admitting: Cardiovascular Disease

## 2014-08-04 LAB — CERULOPLASMIN: Ceruloplasmin: 27 mg/dL (ref 18–36)

## 2014-08-04 MED ORDER — OMEPRAZOLE 40 MG PO CPDR: 40.0000 mg | DELAYED_RELEASE_CAPSULE | Freq: Every day | ORAL | Status: DC

## 2014-08-04 MED ORDER — FUROSEMIDE 40 MG PO TABS: 40.0000 mg | ORAL_TABLET | Freq: Two times a day (BID) | ORAL | Status: DC

## 2014-08-04 MED ORDER — POTASSIUM CHLORIDE CRYS ER 20 MEQ PO TBCR: 20.0000 meq | EXTENDED_RELEASE_TABLET | Freq: Every day | ORAL | Status: DC

## 2014-08-04 MED ORDER — CARVEDILOL 3.125 MG PO TABS: 3.1250 mg | ORAL_TABLET | Freq: Two times a day (BID) | ORAL | Status: DC

## 2014-08-04 MED ORDER — HYOSCYAMINE SULFATE 0.125 MG SL SUBL: 0.2500 mg | SUBLINGUAL_TABLET | Freq: Three times a day (TID) | SUBLINGUAL | Status: DC

## 2014-08-04 MED ORDER — ASPIRIN 81 MG PO TBEC: 81.0000 mg | DELAYED_RELEASE_TABLET | Freq: Every day | ORAL | Status: DC

## 2014-08-04 NOTE — Progress Notes (Signed)
Discharge teachings ,and wit prescription given by [facility] <Cn reinforced and reviewed  Teaching. Pt verbalized undersatanding

## 2014-08-04 NOTE — Progress Notes (Addendum)
Subjective: No CP   NO SOB    Objective: Vital signs in last 24 hours: Temp:  [97.3 F (36.3 C)-98.6 F (37 C)] 98.1 F (36.7 C) (01/08 0547) Pulse Rate:  [66-81] 66 (01/08 0602) Resp:  [16-18] 18 (01/08 0547) BP: (102-135)/(74-91) 135/82 mmHg (01/08 0547) SpO2:  [96 %-100 %] 96 % (01/08 0547) Weight:  [203 lb 9.6 oz (92.352 kg)] 203 lb 9.6 oz (92.352 kg) (01/08 0547) Weight change: -4 lb (-1.814 kg) Last BM Date: 08/02/14 Intake/Output from previous day: +75 01/07 0701 - 01/08 0700 In: 1800 [P.O.:1800] Out: 1725 [Urine:1725] Intake/Output this shift:    PE: General:Pleasant affect, NAD Skin:Warm and dry, brisk capillary refill HEENT:normocephalic, sclera clear, mucus membranes moist Neck:supple, no JVD, no bruits  Heart:S1S2 RRR without murmur, gallup, rub or click Lungs:clear without rales, rhonchi, or wheezes CWU:GQBV, non tender, + BS, do not palpate liver spleen or masses Ext:no lower ext edema, 2+ pedal pulses, 2+ radial pulses Neuro:alert and oriented, MAE, follows commands, + facial symmetry   Lab Results:  Recent Labs  08/03/14 0527  WBC 6.9  HGB 18.4*  HCT 54.7*  PLT 168   BMET  Recent Labs  08/02/14 0335 08/03/14 0527  NA 139 138  K 4.4 3.4*  CL 104 100  CO2 26 29  GLUCOSE 82 110*  BUN 17 17  CREATININE 1.33 1.32  CALCIUM 8.7 8.8   No results for input(s): TROPONINI in the last 72 hours.  Invalid input(s): CK, MB  Lab Results  Component Value Date   CHOL 329* 03/29/2013   HDL 21* 03/29/2013   LDLCALC NOT CALC 03/29/2013   LDLDIRECT 89 12/09/2012   TRIG 410* 05/26/2013   CHOLHDL 15.7 03/29/2013   Lab Results  Component Value Date   HGBA1C  12/26/2007    5.7 (NOTE)   The ADA recommends the following therapeutic goals for glycemic   control related to Hgb A1C measurement:   Goal of Therapy:   < 7.0% Hgb A1C   Action Suggested:  > 8.0% Hgb A1C   Ref:  Diabetes Care, 22, Suppl. 1, 1999     Lab Results  Component Value  Date   TSH 1.477 04/04/2013    Hepatic Function Panel  Recent Labs  08/03/14 0527  PROT 5.8*  ALBUMIN 3.4*  AST 40*  ALT 109*  ALKPHOS 62  BILITOT 0.9   No results for input(s): CHOL in the last 72 hours. No results for input(s): PROTIME in the last 72 hours.     Studies/Results: Nm Myocar Multi W/spect W/wall Motion / Ef  08/02/2014   CLINICAL DATA:  54 y/o male w/o prior cardiac hx who was admitted on 1/4 with a 3+ month h/o progressive DOE, abdominal bloating, and abdominal discomfort and has been found to have an EF of 20-25 %.  EXAM: MYOCARDIAL IMAGING WITH SPECT (REST AND PHARMACOLOGIC-STRESS)  GATED LEFT VENTRICULAR WALL MOTION STUDY  LEFT VENTRICULAR EJECTION FRACTION  TECHNIQUE: Standard myocardial SPECT imaging was performed after resting intravenous injection of 10 mCi Tc-58m sestamibi. Subsequently, intravenous infusion of Lexiscan was performed under the supervision of the Cardiology staff. At peak effect of the drug, 30 mCi Tc-51m sestamibi was injected intravenously and standard myocardial SPECT imaging was performed. Quantitative gated imaging was also performed to evaluate left ventricular wall motion, and estimate left ventricular ejection fraction.  COMPARISON:  None.  FINDINGS: Perfusion: There is a mild reduction in inferior wall tracer uptake, unchanged  between rest and stress images. Otherwise there is uniform tracer uptake  Wall Motion: The left ventricle is dilated and has severe diffuse hypokinesis. The inferior wall contractility is similar to the remainder of the LV walls.  Left Ventricular Ejection Fraction: 24 %  End diastolic volume 254 ml  End systolic volume 270 ml  IMPRESSION:  1. No reversible ischemia or infarction. The inferior wall abnormality is most likely due to diaphragmatic attenuation artifact.  2.  Severe left ventricular diffuse hypokinesis.  3. Left ventricular ejection fraction 24%  4. High-risk stress test findings, due to severely reduced LV  systolic function. The findings are more consistent with nonischemic cardiomyopathy, but cannot fully exclude "balanced" ischemia*.  *2012 Appropriate Use Criteria for Coronary Revascularization Focused Update: J Am Coll Cardiol. 6237;62(8):315-176. http://[email or web address]?articleid=[email or web address]   Electronically Signed   By: Sanda Klein   On: 08/02/2014 13:10    CARDIAC CATH: Left mainstem: Widely patent vessel without stenosis.   Left anterior descending (LAD): Mild diffuse LAD/diagonal stenosis throughout the mid vessel. Approximately 30% diffuse stenosis noted.There are 2 large diagonals with mild diffuse disease but no high-grade stenosis.   Left circumflex (LCx): Patent vessel without stenosis. Supplies 3 OM branches.   Right coronary artery (RCA): Large, dominant vessel without significant obstruction. Minor irregularity in the mid and distal vessel. The PDA is patent.   Left ventriculography: Left ventricular systolic function is normal, LVEF is estimated at 55-65%, there is no significant mitral regurgitation   Estimated Blood Loss: minimal  Final Conclusions:  1. Mild nonobstructive CAD 2. Known severe LV systolic dysfunction  Recommendations: med Rx for nonobstructive CAD and nonischemic cardiomyopathy   Medications: I have reviewed the patient's current medications. Scheduled Meds: . aspirin EC  81 mg Oral Daily  . carvedilol  3.125 mg Oral BID WC  . docusate sodium  200 mg Oral BID  . folic acid  1 mg Oral Daily  . furosemide  40 mg Oral BID  . gemfibrozil  600 mg Oral BID AC  . heparin  5,000 Units Subcutaneous 3 times per day  . hyoscyamine  0.25 mg Oral TID AC & HS  . losartan  50 mg Oral Daily  . multivitamin with minerals  1 tablet Oral Daily  . omega-3 acid ethyl esters  1 g Oral BID  . pantoprazole  40 mg Oral BID AC  . potassium chloride  20 mEq Oral Daily  . sertraline  100 mg Oral Daily  . thiamine  100 mg Oral Daily   Continuous  Infusions:  PRN Meds:.albuterol, diazepam, ondansetron, oxyCODONE  Assessment/Plan: 54 y/o male with a history of HTN, tobacco/ETOH abuse, GERD, hep C, PUD, and chronic pancreatitis and no prior cardiac hx who was admitted on 07/31/14 with a 3+ month h/o progressive DOE, abdominal bloating, and abdominal discomfort and has been found to have an EF of 20-25 %. Cath yesterday with non obstructive CAD.  EF by cath 16-07%  Acute Systolic CHF: Pt presented with a 3 month h/o progressive DOE with abdominal bloating and epigastric discomfort. He was found to have volume overload and now EF of 20-25% with global HK. Net -3.5 L fluid since admit Wt down 7 lbsfrom pk weight of 210  -- Suspect NICM in setting of long h/o ETOH abuse. Nuc study was negative for ischemia, EF 24% with nuc as well.Patinet wanted to make sure no dz  Cath yesterday  Showed   - non obstructive disease but with cath EF normal.  Acute on chronic abdominal pain: Seen by GI. Imaging unrevealing.Patinet does have some liver inflammation  YBW:LSLHTD   HLD/HTG: ETOH cessation. Now on lopid.  EtoH abuse- Needs to stop drinking and he admits and accepts this.  Tobacco cessation: Has been counseled   LOS: 5 days   Time spent with pt. :15 minutes. [facility] R  Nurse Practitioner Certified Pager [phone] or after 5pm and on weekends call [phone] 08/04/2014, 8:37 AM   Patinet seen and examined.  Lungs CTA  Ext NO edema Volume status looks good   Cath yest with minimal CAD   Nonischemic cardiomyopathy WIll need to be followed as outpatient with echo later this spring.   Will make sure he has f/u appt OK to d/c today.    Patient has f/u scheduled with [facility] on 08/11/14 with Angelica Ran at [facility] at [place]

## 2014-08-04 NOTE — Discharge Instructions (Signed)
Chronic Obstructive Pulmonary Disease  Chronic obstructive pulmonary disease (COPD) is a common lung condition in which airflow from the lungs is limited. COPD is a general term that can be used to describe many different lung problems that limit airflow, including both chronic bronchitis and emphysema. If you have COPD, your lung function will probably never return to normal, but there are measures you can take to improve lung function and make yourself feel better.   CAUSES    Smoking (common).    Exposure to secondhand smoke.    Genetic problems.   Chronic inflammatory lung diseases or recurrent infections.  SYMPTOMS    Shortness of breath, especially with physical activity.    Deep, persistent (chronic) cough with a large amount of thick mucus.    Wheezing.    Rapid breaths (tachypnea).    Gray or bluish discoloration (cyanosis) of the skin, especially in fingers, toes, or lips.    Fatigue.    Weight loss.    Frequent infections or episodes when breathing symptoms become much worse (exacerbations).    Chest tightness.  DIAGNOSIS   Your health care provider will take a medical history and perform a physical examination to make the initial diagnosis. Additional tests for COPD may include:    Lung (pulmonary) function tests.   Chest X-ray.   CT scan.   Blood tests.  TREATMENT   Treatment available to help you feel better when you have COPD includes:    Inhaler and nebulizer medicines. These help manage the symptoms of COPD and make your breathing more comfortable.   Supplemental oxygen. Supplemental oxygen is only helpful if you have a low oxygen level in your blood.    Exercise and physical activity. These are beneficial for nearly all people with COPD. Some people may also benefit from a pulmonary rehabilitation program.  HOME CARE INSTRUCTIONS    Take all medicines (inhaled or pills) as directed by your health care provider.   Avoid over-the-counter medicines or cough syrups  that dry up your airway (such as antihistamines) and slow down the elimination of secretions unless instructed otherwise by your health care provider.    If you are a smoker, the most important thing that you can do is stop smoking. Continuing to smoke will cause further lung damage and breathing trouble. Ask your health care provider for help with quitting smoking. He or she can direct you to community resources or hospitals that provide support.   Avoid exposure to irritants such as smoke, chemicals, and fumes that aggravate your breathing.   Use oxygen therapy and pulmonary rehabilitation if directed by your health care provider. If you require home oxygen therapy, ask your health care provider whether you should purchase a pulse oximeter to measure your oxygen level at home.    Avoid contact with individuals who have a contagious illness.   Avoid extreme temperature and humidity changes.   Eat healthy foods. Eating smaller, more frequent meals and resting before meals may help you maintain your strength.   Stay active, but balance activity with periods of rest. Exercise and physical activity will help you maintain your ability to do things you want to do.   Preventing infection and hospitalization is very important when you have COPD. Make sure to receive all the vaccines your health care provider recommends, especially the pneumococcal and influenza vaccines. Ask your health care provider whether you need a pneumonia vaccine.   Learn and use relaxation techniques to manage stress.   Learn   going to whistle and breathe out (exhale) through the pursed lips for 2 seconds.   Diaphragmatic breathing. Start by putting one hand on your abdomen just above  your waist. Inhale slowly through your nose. The hand on your abdomen should move out. Then purse your lips and exhale slowly. You should be able to feel the hand on your abdomen moving in as you exhale.   Learn and use controlled coughing to clear mucus from your lungs. Controlled coughing is a series of short, progressive coughs. The steps of controlled coughing are:  1. Lean your head slightly forward.  2. Breathe in deeply using diaphragmatic breathing.  3. Try to hold your breath for 3 seconds.  4. Keep your mouth slightly open while coughing twice.  5. Spit any mucus out into a tissue.  6. Rest and repeat the steps once or twice as needed. SEEK MEDICAL CARE IF:   You are coughing up more mucus than usual.   There is a change in the color or thickness of your mucus.   Your breathing is more labored than usual.   Your breathing is faster than usual.  SEEK IMMEDIATE MEDICAL CARE IF:   You have shortness of breath while you are resting.   You have shortness of breath that prevents you from:  Being able to talk.   Performing your usual physical activities.   You have chest pain lasting longer than 5 minutes.   Your skin color is more cyanotic than usual.  You measure low oxygen saturations for longer than 5 minutes with a pulse oximeter. MAKE SURE YOU:   Understand these instructions.  Will watch your condition.  Will get help right away if you are not doing well or get worse. Document Released: 04/23/2005 Document Revised: 11/28/2013 Document Reviewed: 03/10/2013 [facility] Patient Information 2015 [place], [place]. This information is not intended to replace advice given to you by your health care provider. Make sure you discuss any questions you have with your health care provider.   Weigh daily Call [phone] if weight climbs more than 3 pounds in a day or 5 pounds in a week. No salt to very little salt in your diet.  No more than 2000 mg in a day. Call  if increased shortness of breath or increased swelling.

## 2014-08-04 NOTE — Discharge Summary (Addendum)
Physician Discharge Summary  Jerome Adams DJM:[id number] DOB: 25-Feb-1961 DOA: 07/30/2014  PCP: Antonietta Jewel, MD  Admit date: 07/30/2014 Discharge date: 08/04/2014  Recommendations for Outpatient Follow-up:  1. Pt will need to follow up with PCP in 2 weeks post discharge 2. Please obtain BMP in one week 3. Follow-up with cardiology, Ignacia Bayley, NP on 08/11/14 @0800   Discharge Diagnoses:  Acute systolic CHF/NICM -22/29/7989 echo with EF 20-25% -Lexiscan nuclear medicine stress test negative for reversible ischemia, EF 24% -08/03/14 transitioned to po lasix 40 mg po twice a day -appreciate cardiology followup -Daily weights---3 kg with admission -Continue carvedilol and losartan -08/03/14 heart catheterization-minimal nonobstructive CAD-->med Rx for NICM -Patient was able to lay flat for the Lexiscan and cath -NICM likely due to Etoh Abdominal pain -Abdominal ultrasound and HIDA scan negative for acute cholecystitis -LFTs trending down with diuresis -Likely due to hepatic congestion from the patient's CHF -Continues to improve clinically -appreciate GI followup-->suspect IBS, recommended Levsin -Patient's diet was advanced and he tolerated it Alcohol dependence  -no signs of alcohol withdrawal -initlal lipase 26 - likely contributing to the patient's cardiomyopathy  Tobacco abuse/COPD  -Tobacco cessation discussed  -No wheezing presently Chronic pain syndrome -try to wean IV opioids -will need out pain management referral Hep C antibody positive -12/28/13 HCV DNA negative Hypertension -Controlled -Continue losartan and carvedilol  Discharge Condition: Stable  Disposition: Home     Follow-up Information    Follow up with Murray Hodgkins, NP On 08/11/2014.   Specialty:  Nurse Practitioner   Why:  at 8:00AM - Dr. Rosezella Florida NP   Contact information:   [address] [place] [address] [phone]       Diet: heart healthy Wt Readings from  Last 3 Encounters:  08/04/14 92.352 kg (203 lb 9.6 oz)  04/25/14 94.348 kg (208 lb)  02/27/14 93.441 kg (206 lb)    History of present illness:  54 y.o. male with past medical history of hypertension, hyperlipidemia, GERD, depression, hepatitis C, history of alcoholic pancreatitis, COPD, smoking, who presented with abdominal pain and shortness of breath that was progressively getting worse over a period of 3 weeks.  Patient reports that he has chronic abdominal pain due to previous alcoholic pancreatitis. In the past 10 days, his abdominal pain has been worsening. The abdominal pain is diffuse and severe. It is worse on right upper quadrant. He does not have nausea, vomiting or diarrhea. Patient used to be a heavy drinker. He states that he quit drinking for a while, but drank alcohol in the holiday season. Last drinking was 3 weeks ago.  Patient also reports having shortness of breath in the past 10 days. He attributes his shortness of breath to abdominal pain which limits his breathing. Workup had revealed the patient had acute systolic CHF. Echocardiogram showed EF 20-25%. The patient was started on intravenous furosemide with good clinical effect. The patient was noted to have increased liver enzymes. Abdominal ultrasound showed nonspecific bladder wall thickening. HIDA scan was negative for acute cholecystitis. With diuresis, the patient's abdominal pain improved as well as his LFTs. The patient remained afebrile and hemodynamically stable without any antibiotics. Cardiology was consulted for the patient's cardiomyopathy. Lexiscan suggested balanced ischemia. The patient underwent cardiac catheterization on 01/02/2015 which showed minimal nonobstructive disease. The patient was cleared for discharge with or furosemide 40 mg by mouth twice a day  Consultants: Cardiology  Discharge Exam: Filed Vitals:   08/04/14 1108  BP: 110/84  Pulse: 78  Temp:  Resp: 18   Filed Vitals:   08/04/14 0158  08/04/14 0547 08/04/14 0602 08/04/14 1108  BP: 109/78 135/82  110/84  Pulse: 81 73 66 78  Temp: 98.4 F (36.9 C) 98.1 F (36.7 C)    TempSrc: Oral Oral    Resp: 18 18  18   Height:      Weight:  92.352 kg (203 lb 9.6 oz)    SpO2: 99% 96%  100%   General: A&O x 3, NAD, pleasant, cooperative Cardiovascular: RRR, no rub, no gallop, no S3 Respiratory: CTAB, no wheeze, no rhonchi Abdomen:soft, nontender, nondistended, positive bowel sounds Extremities: No edema, No lymphangitis, no petechiae  Discharge Instructions     Medication List    STOP taking these medications        hydrochlorothiazide 25 MG tablet  Commonly known as:  HYDRODIURIL     predniSONE 10 MG tablet  Commonly known as:  DELTASONE      TAKE these medications        albuterol 108 (90 BASE) MCG/ACT inhaler  Commonly known as:  PROVENTIL HFA;VENTOLIN HFA  Inhale 2-4 puffs into the lungs every 6 (six) hours as needed for wheezing or shortness of breath.     aspirin 81 MG EC tablet  Take 1 tablet (81 mg total) by mouth daily.     carvedilol 3.125 MG tablet  Commonly known as:  COREG  Take 1 tablet (3.125 mg total) by mouth 2 (two) times daily with a meal.     diazepam 5 MG tablet  Commonly known as:  VALIUM  Take 5 mg by mouth daily as needed for anxiety.     furosemide 40 MG tablet  Commonly known as:  LASIX  Take 1 tablet (40 mg total) by mouth 2 (two) times daily.     gemfibrozil 600 MG tablet  Commonly known as:  LOPID  Take 1 tablet (600 mg total) by mouth 2 (two) times daily before a meal.     hyoscyamine 0.125 MG SL tablet  Commonly known as:  LEVSIN SL  Take 2 tablets (0.25 mg total) by mouth 4 (four) times daily -  before meals and at bedtime.     losartan 50 MG tablet  Commonly known as:  COZAAR  Take 50 mg by mouth daily.     lovastatin 40 MG tablet  Commonly known as:  MEVACOR  Take 1 tablet (40 mg total) by mouth daily.     MULTIVITAMIN PO  Take 1 tablet by mouth daily.      omega-3 acid ethyl esters 1 G capsule  Commonly known as:  LOVAZA  Take 1 capsule (1 g total) by mouth 2 (two) times daily.     omeprazole 40 MG capsule  Commonly known as:  PRILOSEC  Take 1 capsule (40 mg total) by mouth daily.     oxyCODONE-acetaminophen 5-325 MG per tablet  Commonly known as:  PERCOCET  Take 2 tablets by mouth every 4 (four) hours as needed.     potassium chloride SA 20 MEQ tablet  Commonly known as:  K-DUR,KLOR-CON  Take 1 tablet (20 mEq total) by mouth daily.     sertraline 100 MG tablet  Commonly known as:  ZOLOFT  Take 100 mg by mouth daily.     testosterone cypionate 200 MG/ML injection  Commonly known as:  DEPOTESTOTERONE CYPIONATE  Inject 200 mg into the muscle once a week. Once per week         The results  of significant diagnostics from this hospitalization (including imaging, microbiology, ancillary and laboratory) are listed below for reference.    Significant Diagnostic Studies: Dg Chest 2 View  07/30/2014   CLINICAL DATA:  Initial evaluation for shortness of breath and upper abdominal pain for a while, worse lately, shortness of breath worse when walking or active, patient smokes  EXAM: CHEST  2 VIEW  COMPARISON:  04/25/2014  FINDINGS: Moderately severe cardiac enlargement. Mild to moderate vascular congestion. No pulmonary edema consolidation or effusion. Spinal stimulator again identified.  IMPRESSION: Similar to prior study there is moderate cardiac enlargement and vascular congestion. No acute findings.   Electronically Signed   By: Skipper Cliche M.D.   On: 07/30/2014 18:32   Nm Hepatobiliary Including Gb  07/31/2014   CLINICAL DATA:  Three month history of epigastric abdominal pain. Current history of hepatitis-C and hypertension.  EXAM: NUCLEAR MEDICINE HEPATOBILIARY IMAGING WITH GALLBLADDER EF  TECHNIQUE: Sequential images of the abdomen were obtained out to 60 minutes following intravenous administration of radiopharmaceutical. After slow  intravenous infusion of 1.9 micrograms Cholecystokinin, gallbladder ejection fraction was determined.  RADIOPHARMACEUTICALS:  5.0 mCi Tc-73m Choletec  COMPARISON:  No prior nuclear imaging. Right upper quadrant abdominal ultrasound 07/31/1014.  FINDINGS: Hepatic uptake of Choletec is normal. Intra and extrahepatic bile ducts are visible within 15 min. Small bowel activity is visible within 55 min. Gallbladder activity is visible within 20 min, with normal filling of the gallbladder throughout the remainder of the imaging sequence. There is no significant tracer retention by the liver.  Gallbladder ejection fraction measured 83.2% at 30 min. At 30 min, normal ejection fraction is greater than 30%.  The patient did experience symptoms during CCK infusion. Symptoms included abdominal cramping.  IMPRESSION: 1. Normal hepatobiliary imaging and normal gallbladder ejection fraction. 2. The patient did experience abdominal cramping during CCK infusion.   Electronically Signed   By: Evangeline Dakin M.D.   On: 07/31/2014 15:41   Nm Myocar Multi W/spect W/wall Motion / Ef  08/02/2014   CLINICAL DATA:  54 y/o male w/o prior cardiac hx who was admitted on 1/4 with a 3+ month h/o progressive DOE, abdominal bloating, and abdominal discomfort and has been found to have an EF of 20-25 %.  EXAM: MYOCARDIAL IMAGING WITH SPECT (REST AND PHARMACOLOGIC-STRESS)  GATED LEFT VENTRICULAR WALL MOTION STUDY  LEFT VENTRICULAR EJECTION FRACTION  TECHNIQUE: Standard myocardial SPECT imaging was performed after resting intravenous injection of 10 mCi Tc-27m sestamibi. Subsequently, intravenous infusion of Lexiscan was performed under the supervision of the Cardiology staff. At peak effect of the drug, 30 mCi Tc-71m sestamibi was injected intravenously and standard myocardial SPECT imaging was performed. Quantitative gated imaging was also performed to evaluate left ventricular wall motion, and estimate left ventricular ejection fraction.   COMPARISON:  None.  FINDINGS: Perfusion: There is a mild reduction in inferior wall tracer uptake, unchanged between rest and stress images. Otherwise there is uniform tracer uptake  Wall Motion: The left ventricle is dilated and has severe diffuse hypokinesis. The inferior wall contractility is similar to the remainder of the LV walls.  Left Ventricular Ejection Fraction: 24 %  End diastolic volume 790 ml  End systolic volume 240 ml  IMPRESSION:  1. No reversible ischemia or infarction. The inferior wall abnormality is most likely due to diaphragmatic attenuation artifact.  2.  Severe left ventricular diffuse hypokinesis.  3. Left ventricular ejection fraction 24%  4. High-risk stress test findings, due to severely reduced LV systolic function.  The findings are more consistent with nonischemic cardiomyopathy, but cannot fully exclude "balanced" ischemia*.  *2012 Appropriate Use Criteria for Coronary Revascularization Focused Update: J Am Coll Cardiol. 8182;99(3):716-967. http://content.airportbarriers.com.aspx?articleid=1201161   Electronically Signed   By: Sanda Klein   On: 08/02/2014 13:10   US Abdomen Limited Ruq  07/31/2014   CLINICAL DATA:  Epigastric pain for 3 months. History of hepatitis-C, hypertension and pancreatitis.  EXAM: US ABDOMEN LIMITED - RIGHT UPPER QUADRANT  COMPARISON:  CT and ultrasound, 04/25/2014.  FINDINGS: Gallbladder:  No gallstones. Wall is thickened to 4.6 mm. No focal tenderness over the gallbladder.  Common bile duct:  Diameter: 4.9 mm.  No duct stone is seen.  Liver:  No focal lesion identified. Within normal limits in parenchymal echogenicity.  IMPRESSION: 1. No gallstones. Nonspecific gallbladder wall thickening. No focal tenderness to transducer pressure over the gallbladder. Gallbladder wall thickening is likely reactive and may be from adjacent hepatic inflammation although the ultrasound appearance of the liver is unremarkable. 2. No other abnormalities.  No acute  findings.   Electronically Signed   By: Lajean Manes M.D.   On: 07/31/2014 09:23     Microbiology: Recent Results (from the past 240 hour(s))  MRSA PCR Screening     Status: None   Collection Time: 07/31/14  8:44 AM  Result Value Ref Range Status   MRSA by PCR NEGATIVE NEGATIVE Final    Comment:        The GeneXpert MRSA Assay (FDA approved for NASAL specimens only), is one component of a comprehensive MRSA colonization surveillance program. It is not intended to diagnose MRSA infection nor to guide or monitor treatment for MRSA infections.      Labs: Basic Metabolic Panel:  Recent Labs Lab 07/30/14 1750 07/31/14 0300 08/01/14 0340 08/02/14 0335 08/03/14 0527  NA 134* 140 138 139 138  K 4.2 4.0 4.6 4.4 3.4*  CL 102 103 102 104 100  CO2 24 27 26 26 29   GLUCOSE 117* 144* 84 82 110*  BUN 16 14 15 17 17   CREATININE 1.33 1.37* 1.26 1.33 1.32  CALCIUM 8.9 9.1 8.6 8.7 8.8   Liver Function Tests:  Recent Labs Lab 07/30/14 1750 07/31/14 0300 08/01/14 0340 08/02/14 0335 08/03/14 0527  AST 112* 159*  143* 96* 56* 40*  ALT 182* 230*  226* 185* 135* 109*  ALKPHOS 66 63  63 62 54 62  BILITOT 1.4* 1.7*  1.4* 0.8 1.1 0.9  PROT 5.9* 6.2  6.4 5.6* 5.4* 5.8*  ALBUMIN 3.6 3.7  3.7 3.3* 3.2* 3.4*    Recent Labs Lab 07/30/14 1750  LIPASE 26   No results for input(s): AMMONIA in the last 168 hours. CBC:  Recent Labs Lab 07/30/14 1750 07/31/14 0300 08/01/14 0340 08/03/14 0527  WBC 6.9 8.4 7.6 6.9  HGB 18.3* 18.1* 18.6* 18.4*  HCT 55.5* 55.0* 56.8* 54.7*  MCV 77.5* 78.2 79.6 76.5*  PLT 172 179 179 168   Cardiac Enzymes:  Recent Labs Lab 07/31/14 0300 07/31/14 0923 07/31/14 1627  TROPONINI 0.04* 0.04* 0.03   BNP: Invalid input(s): POCBNP CBG:  Recent Labs Lab 07/31/14 0858 08/01/14 0600 08/02/14 0609 08/03/14 0602  GLUCAP 108* 113* 102* 108*    Time coordinating discharge:  Greater than 30 minutes  Signed:  Synda Bagent, DO [facility] Pager: [phone] 08/04/2014, 12:11 PM

## 2014-08-07 NOTE — Telephone Encounter (Signed)
Closed encounter °

## 2014-08-09 ENCOUNTER — Encounter: Payer: Self-pay | Admitting: Physician Assistant

## 2014-08-09 DIAGNOSIS — G894 Chronic pain syndrome: Secondary | ICD-10-CM | POA: Insufficient documentation

## 2014-08-09 DIAGNOSIS — G8929 Other chronic pain: Secondary | ICD-10-CM | POA: Insufficient documentation

## 2014-08-09 DIAGNOSIS — I2510 Atherosclerotic heart disease of native coronary artery without angina pectoris: Secondary | ICD-10-CM | POA: Insufficient documentation

## 2014-08-09 DIAGNOSIS — F1010 Alcohol abuse, uncomplicated: Secondary | ICD-10-CM | POA: Insufficient documentation

## 2014-08-09 DIAGNOSIS — Z720 Tobacco use: Secondary | ICD-10-CM | POA: Insufficient documentation

## 2014-08-09 DIAGNOSIS — M545 Low back pain, unspecified: Secondary | ICD-10-CM | POA: Insufficient documentation

## 2014-08-09 DIAGNOSIS — I428 Other cardiomyopathies: Secondary | ICD-10-CM | POA: Insufficient documentation

## 2014-08-09 DIAGNOSIS — N182 Chronic kidney disease, stage 2 (mild): Secondary | ICD-10-CM | POA: Insufficient documentation

## 2014-08-09 DIAGNOSIS — R7401 Elevation of levels of liver transaminase levels: Secondary | ICD-10-CM | POA: Insufficient documentation

## 2014-08-09 DIAGNOSIS — I5022 Chronic systolic (congestive) heart failure: Secondary | ICD-10-CM | POA: Insufficient documentation

## 2014-08-09 DIAGNOSIS — I10 Essential (primary) hypertension: Secondary | ICD-10-CM | POA: Insufficient documentation

## 2014-08-09 DIAGNOSIS — F1910 Other psychoactive substance abuse, uncomplicated: Secondary | ICD-10-CM | POA: Insufficient documentation

## 2014-08-09 DIAGNOSIS — K219 Gastro-esophageal reflux disease without esophagitis: Secondary | ICD-10-CM | POA: Insufficient documentation

## 2014-08-09 DIAGNOSIS — R740 Nonspecific elevation of levels of transaminase and lactic acid dehydrogenase [LDH]: Secondary | ICD-10-CM

## 2014-08-09 DIAGNOSIS — K279 Peptic ulcer, site unspecified, unspecified as acute or chronic, without hemorrhage or perforation: Secondary | ICD-10-CM | POA: Insufficient documentation

## 2014-08-09 DIAGNOSIS — K861 Other chronic pancreatitis: Secondary | ICD-10-CM | POA: Insufficient documentation

## 2014-08-09 NOTE — Progress Notes (Signed)
Cardiology Office Note  Date:  08/10/2014   Patient ID:  Jerome, Adams 02/19/1961, MRN [id number]  PCP:  Antonietta Jewel, MD  Cardiologist: New to Dr. Percival Spanish last admission  History of Present Illness: Jerome Adams is a 54 y.o. male who presents today for transition-of-care cardiology follow-up. He has a history of recently diagnosed acute systolic CHF due to NICM, mild nonobstructive CAD 07/2014, alcohol abuse, remote cocaine abuse (UDS neg for this 07/2014), HTN, hyperlipidemia/hypertriglyceridemia, ongoing tobacco abuse, chronic pancreatitis, cleared hepatitis C (never treated - undectable virus 12/2013). He had recently presented to [facility] with complaints of dyspnea, abdominal bloating and weight gain. 2D Echo 08/01/14: moderate concentric LVH, EF 20-25%, diffuse HK, mod dil LA, severely dilated RA, mildly dilated RV, mild TR. CXR showed vascular congestion and BNP was moderately elevated.LFT's were elevated (AST 159, ALT 230 on 1/4), lipase was nl, and troponins were minimally elevated as well (0.03  0.04  0.04.). UDS neg for cocaine, positive for benzos/opiates. He was admitted and placed on IV lasix for CHF.Abd U/S showed GB thickening.HIDA scan was nl. HIV negative. GI was consulted and sx were felt to possibly be r/t IBS/MSK pain and/or aerophagia 2/2 CHF. PPI and levsin were added along with scheduled diuresis. He underwent nuclear stress test demonstrating no reversible ischemia/infarction but could not rule out balanced ischemia. He underwent LHC 08/03/14 demonstrating mild nonobstructive CAD with 30% diffuse LAD disease, otherwise negative. It appears the new cardiac meds at discharge included Coreg 3.125mg  BID, Lasix 40mg  BID, KCl 39meq daily. 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 He felt great going home.  He says he has not had a drink since discharge. He has not yet quit smoking but is trying. He has been compliant with all meds except  for Levsin which he said was too expensive (insurance cost was $70 for 11 pills). He had no problem obtaining his other medications. He says his dry weight was 197. When he first went home he felt great. However, he began eating out a little more and noticed gradual weight gain eventually back to 206. His belly began bloating again. He said he thought he could order a low-salt Whopper but 1) realized this wasn't possible and 2) realized if he's going to eat meat, he had to start cooking it himself. He subsequently started cooking more home meals and eating more fruits/vegetables. His weight started coming back down. He was at 203 by home scale today. Belly feels much better but still marginally bloated. Denies SOB, chest pain, orthopnea, LEE. He does note some urinary hesitancy at times.  Past Medical History  Diagnosis Date  . PUD (peptic ulcer disease)   . Essential hypertension   . Allergy   . GERD (gastroesophageal reflux disease)     takes  otc  . Spinal cord stimulator status     has had for 1 yr--inserted by [facility]  . Sleep apnea     was tested here at [facility].Marland KitchenMarland KitchenMarland KitchenNever heard anymore about it (03/30/2013)  . Arthritis     "joints" (03/30/2013)  . Chronic lower back pain   . Depression   . Hepatitis C 2010    a. never treated - undectable virus 12/2013.  [name] Chronic pancreatitis   . NICM (nonischemic cardiomyopathy)     a. 12/2007 Echo: EF 45-50%;  b. 07/2014 Echo: EF 20-25%, diff HK, mod dil LA, sev dil RA, mild TR, PASP 31 mmHg.  [name]  Alcohol abuse   . Drug abuse     a. Remote drug abuse (cocaine). UDS 07/2014 neg for cocaine.  . Chronic systolic CHF (congestive heart failure)   . Transaminitis   . Tobacco abuse   . Hyperlipidemia   . Hypertriglyceridemia   . Chronic pain syndrome   . CKD (chronic kidney disease), stage II     Current Outpatient Prescriptions  Medication Sig Dispense Refill  . albuterol (PROVENTIL HFA;VENTOLIN HFA) 108 (90 BASE) MCG/ACT inhaler Inhale 2-4 puffs into  the lungs every 6 (six) hours as needed for wheezing or shortness of breath. 1 Inhaler 0  . aspirin EC 81 MG EC tablet Take 1 tablet (81 mg total) by mouth daily. 30 tablet 0  . carvedilol (COREG) 3.125 MG tablet Take 1 tablet (3.125 mg total) by mouth 2 (two) times daily with a meal. 60 tablet 1  . diazepam (VALIUM) 5 MG tablet Take 5 mg by mouth daily as needed for anxiety.     . furosemide (LASIX) 40 MG tablet Take 1 tablet (40 mg total) by mouth 2 (two) times daily. 60 tablet 0  . gemfibrozil (LOPID) 600 MG tablet Take 1 tablet (600 mg total) by mouth 2 (two) times daily before a meal. 60 tablet 5  . losartan (COZAAR) 50 MG tablet Take 50 mg by mouth daily.    [name] lovastatin (MEVACOR) 40 MG tablet Take 1 tablet (40 mg total) by mouth daily. 30 tablet 2  . Multiple Vitamins-Minerals (MULTIVITAMIN PO) Take 1 tablet by mouth daily.    [name] omega-3 acid ethyl esters (LOVAZA) 1 G capsule Take 1 capsule (1 g total) by mouth 2 (two) times daily. 60 capsule 5  . omeprazole (PRILOSEC) 40 MG capsule Take 1 capsule (40 mg total) by mouth daily. 30 capsule 0  . Oxycodone HCl 20 MG TABS Take 1 tablet by mouth every 4 (four) hours as needed (as needed for pain).    . potassium chloride SA (K-DUR,KLOR-CON) 20 MEQ tablet Take 1 tablet (20 mEq total) by mouth daily. 30 tablet 0  . sertraline (ZOLOFT) 100 MG tablet Take 100 mg by mouth daily.    [name] testosterone cypionate (DEPOTESTOTERONE CYPIONATE) 200 MG/ML injection Inject 200 mg into the muscle once a week. Once per week     No current facility-administered medications for this visit.    Allergies:   Hydrocodone-acetaminophen   Social History:  The patient  reports that he has been smoking Cigarettes.  He has a 20 pack-year smoking history. He has never used smokeless tobacco. He reports that he drinks about 2.4 oz of alcohol per week. He reports that he does not use illicit drugs.   Family History:  The patient's family history includes Bone cancer in his  mother; Breast cancer in his sister; Heart disease in his father and sister. There is no history of Esophageal cancer or Stomach cancer.  ROS:  Please see the history of present illness. Positive for urinary hesitancy.  He reports occasional infrequent irregular heartbeat described like a skipped heartbeat. No syncope, near syncope. All other systems are reviewed and otherwise negative.   PHYSICAL EXAM:  VS:  BP 114/90 mmHg  Pulse 74  Ht 5\' 10"  (1.778 m)  Wt 208 lb (94.348 kg)  BMI 29.84 kg/m2  SpO2 96% BMI: Body mass index is 29.84 kg/(m^2). Well nourished, well developed AAM, in no acute distress HEENT: normocephalic, atraumatic Neck: no JVD Cardiac:  normal S1, S2; RRR; no murmur Lungs:  clear  to auscultation bilaterally, no wheezing, rhonchi or rales Abd: rounded, nontender, normoactive BS Ext: no edema Skin: warm and dry Neuro:  moves all extremities spontaneously, no focal abnormalities noted  EKG:  EKG is ordered today. The EKG ordered today for SOB (and nurse felt possibly irregular pulse rate) shows NSR 72bpm 1st degree AVB with one PVC, LVH with repol changes, TWI II, III, avF, V4-V6. No significant change from prior.  Recent Labs: 07/30/2014: B Natriuretic Peptide 1191.1* 08/03/2014: ALT 109*; BUN 17; Creatinine 1.32; Hemoglobin 18.4*; Platelets 168; Potassium 3.4*; Sodium 138  No results found for requested labs within last 365 days.   Estimated Creatinine Clearance: 74.6 mL/min (by C-G formula based on Cr of 1.32).   Wt Readings from Last 3 Encounters:  08/10/14 208 lb (94.348 kg)  08/04/14 203 lb 9.6 oz (92.352 kg)  04/25/14 208 lb (94.348 kg)     Other studies reviewed: Additional studies/records reviewed today include: see above re: echo, cath.  ASSESSMENT AND PLAN:  1. Acute on chronic systolic CHF - he has had some recurrent volume overload likely related to dietary indiscretion. He changed his eating habits two days ago and has already noticed a difference of  the weight starting to come back down. He is still not yet back to baseline. Will increase Lasix to 60mg  QAM/40mg  QPM. Recheck CMET today. Salt/fluid restriction reinforced. Continue BB, ARB. I have asked him to call our office in 1 week with update of his weight to determine if further changes in Lasix are needed. Will have him f/u in the office in 1 month. If he is still compliant with meds and alcohol abstinence at that time, would consider f/u echo at 3 months out from last study to reassess EF. If EF remains low, he may require EP evaluation for ICD candidacy. He seems really motivated to continue recent lifestyle modifications. 2. Essential HTN - recently controlled.  3. CKD stage II - recheck labs today. 4. Mild nonobstructive CAD by cath 07/2014 - continue ASA, BB, statin. 5. Alcohol abuse with recent transaminitis - congratulated him on continued cessation. Reinforced importance of abstinence. 6. H/o hyperlipidemia/hypertriglyceridemia - His statin was continued from recent discharge. Recheck CMET today. 7. PVCs - rare by patient report. Checking potassium today.  Disposition: F/u with Dr. Percival Spanish in 1 month. I also asked him to f/u PCP for BPH-type symptoms such as urinary hesitancy.  Raechel Ache PA-C 08/10/2014 12:24 PM     [address] [place] [address] [phone] (office)  [phone] (fax)

## 2014-08-10 ENCOUNTER — Encounter: Payer: Self-pay | Admitting: Physician Assistant

## 2014-08-10 ENCOUNTER — Ambulatory Visit (INDEPENDENT_AMBULATORY_CARE_PROVIDER_SITE_OTHER): Payer: Medicare Other | Admitting: Physician Assistant

## 2014-08-10 VITALS — BP 114/90 | HR 74 | Ht 70.0 in | Wt 208.0 lb

## 2014-08-10 DIAGNOSIS — E785 Hyperlipidemia, unspecified: Secondary | ICD-10-CM

## 2014-08-10 DIAGNOSIS — N182 Chronic kidney disease, stage 2 (mild): Secondary | ICD-10-CM

## 2014-08-10 DIAGNOSIS — I10 Essential (primary) hypertension: Secondary | ICD-10-CM

## 2014-08-10 DIAGNOSIS — R7401 Elevation of levels of liver transaminase levels: Secondary | ICD-10-CM

## 2014-08-10 DIAGNOSIS — E781 Pure hyperglyceridemia: Secondary | ICD-10-CM

## 2014-08-10 DIAGNOSIS — R740 Nonspecific elevation of levels of transaminase and lactic acid dehydrogenase [LDH]: Secondary | ICD-10-CM

## 2014-08-10 DIAGNOSIS — I5023 Acute on chronic systolic (congestive) heart failure: Secondary | ICD-10-CM

## 2014-08-10 DIAGNOSIS — I2510 Atherosclerotic heart disease of native coronary artery without angina pectoris: Secondary | ICD-10-CM

## 2014-08-10 DIAGNOSIS — I428 Other cardiomyopathies: Secondary | ICD-10-CM

## 2014-08-10 DIAGNOSIS — I429 Cardiomyopathy, unspecified: Secondary | ICD-10-CM

## 2014-08-10 DIAGNOSIS — I5022 Chronic systolic (congestive) heart failure: Secondary | ICD-10-CM

## 2014-08-10 DIAGNOSIS — F1010 Alcohol abuse, uncomplicated: Secondary | ICD-10-CM

## 2014-08-10 DIAGNOSIS — I493 Ventricular premature depolarization: Secondary | ICD-10-CM

## 2014-08-10 LAB — COMPREHENSIVE METABOLIC PANEL
ALT: 46 U/L (ref 0–53)
AST: 43 U/L — ABNORMAL HIGH (ref 0–37)
Albumin: 4.1 g/dL (ref 3.5–5.2)
Alkaline Phosphatase: 75 U/L (ref 39–117)
BUN: 20 mg/dL (ref 6–23)
CO2: 35 mEq/L — ABNORMAL HIGH (ref 19–32)
Calcium: 9.3 mg/dL (ref 8.4–10.5)
Chloride: 98 mEq/L (ref 96–112)
Creatinine, Ser: 1.54 mg/dL — ABNORMAL HIGH (ref 0.40–1.50)
GFR: 61.03 mL/min (ref >60.00)
Glucose, Bld: 89 mg/dL (ref 70–99)
Potassium: 4.3 mEq/L (ref 3.5–5.1)
Sodium: 137 mEq/L (ref 135–145)
Total Bilirubin: 0.6 mg/dL (ref 0.2–1.2)
Total Protein: 7.2 g/dL (ref 6.0–8.3)

## 2014-08-10 MED ORDER — FUROSEMIDE 40 MG PO TABS: ORAL_TABLET | ORAL | Status: DC

## 2014-08-10 NOTE — Patient Instructions (Signed)
Your physician has recommended you make the following change in your medication:  INCREASE LASIX ONE AND ONE HALF TABLET IN THE AM AND ONE TABLET IN THE PM   Your physician recommends that you HAVE lab work TODAY: CMET   Your physician recommends that you KEEP YOUR scheduled a follow-up appointment with Dr. Percival Spanish.   Follow up with your primary care doctor for prostate.  Please call us in 1 week with update on your weight please call and leave message for [facility] @ [phone].

## 2014-08-11 ENCOUNTER — Other Ambulatory Visit: Payer: Self-pay | Admitting: *Deleted

## 2014-08-11 ENCOUNTER — Encounter: Payer: Medicare Other | Admitting: Nurse Practitioner

## 2014-08-11 DIAGNOSIS — I10 Essential (primary) hypertension: Secondary | ICD-10-CM

## 2014-08-15 ENCOUNTER — Telehealth: Payer: Self-pay | Admitting: Internal Medicine

## 2014-08-15 NOTE — Telephone Encounter (Signed)
New Msg        Pt calling states since 08/11/14 he has gained 5 1/2 lbs.   Pt also feeling a lot of pressure on his chest, but no pain and bloated in his stomach.   Pt would like a return call.

## 2014-08-15 NOTE — Telephone Encounter (Signed)
Spoke with patient.  Reports weight today is 209.8.  He feels bloated and uses extra effort breathing but not like he did previously when he was hospitalized.  Has been compliant with meds and has followed salt restriction in diet and has refrained from drinking alcohol.  Advised him that Melina Copa is in tomorrow and that I will discuss with her tomorrow am. Asked him to weigh himself in the am (around 8 AM) and someone will call him tomorrow morning. Instructed that if he develops SOB or has distress he is to go to ED to be evaluated.   He verbalizes understanding and agreement.

## 2014-08-16 ENCOUNTER — Other Ambulatory Visit (INDEPENDENT_AMBULATORY_CARE_PROVIDER_SITE_OTHER): Payer: Medicare Other | Admitting: *Deleted

## 2014-08-16 DIAGNOSIS — I10 Essential (primary) hypertension: Secondary | ICD-10-CM

## 2014-08-16 LAB — BASIC METABOLIC PANEL
BUN: 13 mg/dL (ref 6–23)
CO2: 28 mEq/L (ref 19–32)
Calcium: 9.5 mg/dL (ref 8.4–10.5)
Chloride: 102 mEq/L (ref 96–112)
Creatinine, Ser: 1.29 mg/dL (ref 0.40–1.50)
GFR: 74.86 mL/min (ref >60.00)
Glucose, Bld: 165 mg/dL — ABNORMAL HIGH (ref 70–99)
Potassium: 4.2 mEq/L (ref 3.5–5.1)
Sodium: 137 mEq/L (ref 135–145)

## 2014-08-16 NOTE — Telephone Encounter (Signed)
Per dayna, continue lasix 60 in am and 40 in pm for today. Call with weight tomorrow.  Continue sodium restriction. Patient comes today for repeat BMET, based on last weeks labs.  Advised patient to call [facility] office tomorrow to discuss with Dr. Rosezella [place] nurse.  Phone number provided. Advised that if further changes are needed to his medications after his blood work today we will be in touch with him. Pt verbalizes understanding and agreement.

## 2014-08-16 NOTE — Telephone Encounter (Signed)
Spoke with Melina Copa (flex DOD). The concern is patients renal insufficiency.  If his weight is the same or less than yesterday, she will see him in clinic. If his weight is higher today, she rec he go to ED for some labs/iv diuretics.  Per Dayna his dry weight is 197 #.  Called patient. Left message for him to call back.

## 2014-08-16 NOTE — Telephone Encounter (Signed)
Spoke with patient His weight today is 205.0.   Discussed with Dayna,

## 2014-08-17 ENCOUNTER — Telehealth: Payer: Self-pay | Admitting: Cardiology

## 2014-08-17 NOTE — Telephone Encounter (Signed)
Jerome Adams is calling in to report his weight ( Per Sharrell Ku PA) 204.6. Please call if you have any questions    Thanks

## 2014-08-17 NOTE — Telephone Encounter (Signed)
RN spoke to patient. Information given, verbalized understanding.

## 2014-08-17 NOTE — Telephone Encounter (Signed)
Thanks for update. Please ask patient to continue current regimen of Lasix 60mg  QAM/40mg  QPM and call back on Monday if weight not back down to baseline. (Difficult to know what his true dry weight is - it was 203 in the hospital at discharge, but he said it was 197 by home scale.) Regardless, weight continues to improve daily from the 209 he reached the other day. Continue plan and f/u as discussed. Thanks. Wayne Brunker PA-C

## 2014-08-17 NOTE — Telephone Encounter (Signed)
FORWARD TO [place] PA  FOR FURTHER INSTRUCTION

## 2014-09-14 ENCOUNTER — Encounter: Payer: Self-pay | Admitting: Cardiology

## 2014-09-14 ENCOUNTER — Ambulatory Visit (INDEPENDENT_AMBULATORY_CARE_PROVIDER_SITE_OTHER): Payer: Medicare Other | Admitting: Cardiology

## 2014-09-14 VITALS — BP 129/88 | HR 80 | Ht 70.0 in | Wt 214.1 lb

## 2014-09-14 DIAGNOSIS — E878 Other disorders of electrolyte and fluid balance, not elsewhere classified: Secondary | ICD-10-CM

## 2014-09-14 NOTE — Patient Instructions (Addendum)
Your physician recommends that you schedule a follow-up appointment in:  10 days with an extender  We have increased your Lasix to 40 mg one and a half tabs two times a day to equal 60 mg two times a day  We have increased your Coreg to 6.25 mg two times a day  We have ordered labs for you to get done in 10 days

## 2014-09-14 NOTE — Progress Notes (Signed)
Cardiology Office Note   Date:  09/14/2014   ID:  Jerome Adams, DOB June 15, 1961, MRN [id number]  PCP:  Jerome Jewel, MD  Cardiologist:   Minus Breeding, MD   Chief Complaint  Patient presents with  . Cardiomyopathy    History of Present Illness: Jerome Adams is a 54 y.o. male who presents for follow up of dilated cardiomyopathy thought to be related probably to alcohol abuse. He had nonobstructive disease on left heart catheterization in early January. When he was seen initially after his hospitalization he had had some weight gain because of salt dietary indiscretion.  He thinks his weight is up from his discharge weight by about 7 pounds. He stopped weighing himself daily.  He is eating out quite a bit and probably getting too much salt.  He does think he has more dyspnea than he did at discharge. He has abdominal swelling. He's not describing PND or orthopnea. He's not describing chest pressure, neck or arm discomfort. He is still abstaining from alcohol.     Past Medical History  Diagnosis Date  . PUD (peptic ulcer disease)   . Essential hypertension   . Allergy   . GERD (gastroesophageal reflux disease)     takes  otc  . Spinal cord stimulator status     has had for 1 yr--inserted by [facility]  . Sleep apnea     was tested here at [facility].Jerome KitchenMarland KitchenMarland KitchenNever heard anymore about it (03/30/2013)  . Arthritis     "joints" (03/30/2013)  . Chronic lower back pain   . Depression   . Hepatitis C 2010    a. never treated - undectable virus 12/2013.  Jerome Adams Chronic pancreatitis   . NICM (nonischemic cardiomyopathy)     a. 12/2007 Echo: EF 45-50%;  b. 07/2014 Echo: EF 20-25%, diff HK, mod dil LA, sev dil RA, mild TR, PASP 31 mmHg.  Jerome Adams Alcohol abuse   . Drug abuse     a. Remote drug abuse (cocaine). UDS 07/2014 neg for cocaine.  . Transaminitis   . Tobacco abuse   . Hyperlipidemia   . Hypertriglyceridemia   . Chronic pain syndrome   . CKD (chronic kidney disease), stage II     Past  Surgical History  Procedure Laterality Date  . Back surgery    . Wrist surgery Right 1980's    repair of tendons and nerves  . Foot surgery Right 2012?    "took piece off that was hurting me" (03/30/2013)  . Spinal cord stimulator implant  11/14/2012  . Anterior cervical decomp/discectomy fusion  04/05/2012    Procedure: ANTERIOR CERVICAL DECOMPRESSION/DISCECTOMY FUSION 1 LEVEL;  Surgeon: Elaina Hoops, MD;  Location: [facility];  Service: Neurosurgery;  Laterality: N/A;  Cervical five-six anterior cervical decompression and fusion  . Posterior lumbar fusion      "L4-5" (03/31/2103)  . Hardware removal      "took screw out of my back; it had damaged my nerve" (03/30/2013)  . Incision and drainage abscess Left     "lower jaw, tooth abscess" (03/30/2013)  . Left heart catheterization with coronary angiogram N/A 08/03/2014    Procedure: LEFT HEART CATHETERIZATION WITH CORONARY ANGIOGRAM;  Surgeon: Blane Ohara, MD;  Location[facility] CATH LAB;  Service: Cardiovascular;  Laterality: N/A;     Current Outpatient Prescriptions  Medication Sig Dispense Refill  . albuterol (PROVENTIL HFA;VENTOLIN HFA) 108 (90 BASE) MCG/ACT inhaler Inhale 2-4 puffs into the lungs every 6 (six) hours as needed  for wheezing or shortness of breath. 1 Inhaler 0  . aspirin EC 81 MG EC tablet Take 1 tablet (81 mg total) by mouth daily. 30 tablet 0  . carvedilol (COREG) 3.125 MG tablet Take 1 tablet (3.125 mg total) by mouth 2 (two) times daily with a meal. 60 tablet 1  . diazepam (VALIUM) 5 MG tablet Take 5 mg by mouth daily as needed for anxiety.     . furosemide (LASIX) 40 MG tablet TAKE ONE AND ONE HALF TABLET IN THE AM AND ONE TABLET IN THE PM 60 tablet 6  . gemfibrozil (LOPID) 600 MG tablet Take 1 tablet (600 mg total) by mouth 2 (two) times daily before a meal. 60 tablet 5  . losartan (COZAAR) 50 MG tablet Take 50 mg by mouth daily.    Jerome Adams lovastatin (MEVACOR) 40 MG tablet Take 1 tablet (40 mg total) by mouth daily. 30 tablet 2   . Multiple Vitamins-Minerals (MULTIVITAMIN PO) Take 1 tablet by mouth daily.    Jerome Adams omega-3 acid ethyl esters (LOVAZA) 1 G capsule Take 1 capsule (1 g total) by mouth 2 (two) times daily. 60 capsule 5  . omeprazole (PRILOSEC) 40 MG capsule Take 1 capsule (40 mg total) by mouth daily. 30 capsule 0  . Oxycodone HCl 20 MG TABS Take 1 tablet by mouth every 4 (four) hours as needed (as needed for pain).    . potassium chloride SA (K-DUR,KLOR-CON) 20 MEQ tablet Take 1 tablet (20 mEq total) by mouth daily. 30 tablet 0  . sertraline (ZOLOFT) 100 MG tablet Take 100 mg by mouth daily.    Jerome Adams testosterone cypionate (DEPOTESTOTERONE CYPIONATE) 200 MG/ML injection Inject 200 mg into the muscle once a week. Once per week     No current facility-administered medications for this visit.    Allergies:   Hydrocodone-acetaminophen    ROS:  Please see the history of present illness.   Otherwise, review of systems are positive for none.   All other systems are reviewed and negative.     PHYSICAL EXAM: VS:  BP 129/88 mmHg  Pulse 80  Ht 5\' 10"  (1.778 m)  Wt 214 lb 1.6 oz (97.115 kg)  BMI 30.72 kg/m2 , BMI Body mass index is 30.72 kg/(m^2). GENERAL:  Well appearing HEENT:  Pupils equal round and reactive, fundi not visualized, oral mucosa unremarkable NECK:  Positive jugular venous distention, waveform within normal limits, carotid upstroke brisk and symmetric, no bruits, no thyromegaly LYMPHATICS:  No cervical, inguinal adenopathy LUNGS:  Clear to auscultation bilaterally BACK:  No CVA tenderness CHEST:  Unremarkable HEART:  PMI not displaced or sustained,S1 and S2 within normal limits, no S3, no S4, no clicks, no rubs, no murmurs ABD:  Flat, positive bowel sounds normal in frequency in pitch, no bruits, no rebound, no guarding, no midline pulsatile mass, no hepatomegaly, positive splenomegaly EXT:  2 plus pulses throughout, no edema, no cyanosis no clubbing SKIN:  No rashes no nodules     EKG:  EKG is  not ordered today.   Recent Labs: 07/30/2014: B Natriuretic Peptide 1191.1* 08/03/2014: Hemoglobin 18.4*; Platelets 168 08/10/2014: ALT 46 08/16/2014: BUN 13; Creatinine 1.29; Potassium 4.2; Sodium 137     Wt Readings from Last 3 Encounters:  09/14/14 214 lb 1.6 oz (97.115 kg)  08/10/14 208 lb (94.348 kg)  08/04/14 203 lb 9.6 oz (92.352 kg)      Other studies Reviewed: Additional studies/ records that were reviewed today include:  None.   ASSESSMENT AND  PLAN:  Chronic systolic CHF -    We had a long discussion about salt and fluid restriction and daily weights. He is getting too much salt. Increase his Lasix to 60 mg twice daily. I'm going to titrate his beta blocker to 6.25 twice a day. I like him to come back in 10 days for a basic metabolic profile and a follow-up appointment.  Essential HTN - This is being managed in the context of treating his CHF  CKD stage II -   These will be checked as above.   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   Alcohol abuse with recent transaminitis - I again reinforced importance of abstinence.    Current medicines are reviewed at length with the patient today.  The patient does not have concerns regarding medicines.  The following changes have been made:  Med changes as above  Labs/ tests ordered today include: BMET  No orders of the defined types were placed in this encounter.     Disposition:   FU with in 10 days.    Signed, Minus Breeding, MD  09/14/2014 2:23 PM    [place]

## 2014-09-27 LAB — BASIC METABOLIC PANEL
BUN: 10 mg/dL (ref 6–23)
CO2: 26 mEq/L (ref 19–32)
Calcium: 9.4 mg/dL (ref 8.4–10.5)
Chloride: 101 mEq/L (ref 96–112)
Creat: 1.09 mg/dL (ref 0.50–1.35)
Glucose, Bld: 120 mg/dL — ABNORMAL HIGH (ref 70–99)
Potassium: 3.8 mEq/L (ref 3.5–5.3)
Sodium: 139 mEq/L (ref 135–145)

## 2014-09-28 ENCOUNTER — Ambulatory Visit (INDEPENDENT_AMBULATORY_CARE_PROVIDER_SITE_OTHER): Payer: Medicare Other | Admitting: Cardiology

## 2014-09-28 ENCOUNTER — Encounter: Payer: Self-pay | Admitting: Cardiology

## 2014-09-28 VITALS — BP 122/86 | HR 78 | Ht 70.0 in | Wt 213.2 lb

## 2014-09-28 DIAGNOSIS — I10 Essential (primary) hypertension: Secondary | ICD-10-CM | POA: Diagnosis not present

## 2014-09-28 MED ORDER — FUROSEMIDE 40 MG PO TABS: 40.0000 mg | ORAL_TABLET | Freq: Two times a day (BID) | ORAL | Status: DC

## 2014-09-28 MED ORDER — CARVEDILOL 6.25 MG PO TABS: 6.2500 mg | ORAL_TABLET | Freq: Two times a day (BID) | ORAL | Status: DC

## 2014-09-28 NOTE — Progress Notes (Signed)
Cardiology Office Note   Date:  09/28/2014   ID:  Jerome Adams, DOB [birth year], MRN [id number]  PCP:  Antonietta Jewel, MD  Cardiologist:  Dr. Vita Barley      Chief Complaint  Patient presents with  . Cardiomyopathy    10 day f/u per Dr. Percival Spanish; c/o CP, SOB, no other complaints  . Medication Problem    pt missed 2 days of carvedilol       History of Present Illness: Jerome Adams is a 54 y.o. male who presents for further eval for his dilated cardiomyopathy- NICM.  Thought to be due to Alcohol abuse. He had nonobstructive disease on left heart catheterization in early January. When he was seen initially after his hospitalization he had had some weight gain because of salt dietary indiscretion. He had stopped weighing himself daily but he has started weighing again. He is eating out quite a bit still and probably getting too much salt. [employer], [place] Pat[place] he does go to [employer].  Recently he did buy some healthy foods when someone gave him some money. He does think he has more dyspnea than he did at discharge. He has abdominal swelling. He's not describing PND or orthopnea. He's not describing chest pressure, neck or arm discomfort. He is still abstaining from alcohol though he hs thought about having a beer.  He is smoking 1 ppd.  His stomach is feeling full bloated similar to prior hospitalization.    Wt is up here.  No Palpitations.      Past Medical History  Diagnosis Date  . PUD (peptic ulcer disease)   . Essential hypertension   . Allergy   . GERD (gastroesophageal reflux disease)     takes  otc  . Spinal cord stimulator status     has had for 1 yr--inserted by [facility]  . Sleep apnea     was tested here at [facility].Marland KitchenMarland KitchenMarland KitchenNever heard anymore about it (03/30/2013)  . Arthritis     "joints" (03/30/2013)  . Chronic lower back pain   . Depression   . Hepatitis C 2010    a. never treated - undectable virus 12/2013.  [name] Chronic pancreatitis   . NICM (nonischemic  cardiomyopathy)     a. 12/2007 Echo: EF 45-50%;  b. 07/2014 Echo: EF 20-25%, diff HK, mod dil LA, sev dil RA, mild TR, PASP 31 mmHg.  [name] Alcohol abuse   . Drug abuse     a. Remote drug abuse (cocaine). UDS 07/2014 neg for cocaine.  . Transaminitis   . Tobacco abuse   . Hyperlipidemia   . Hypertriglyceridemia   . Chronic pain syndrome   . CKD (chronic kidney disease), stage II     Past Surgical History  Procedure Laterality Date  . Back surgery    . Wrist surgery Right 1980's    repair of tendons and nerves  . Foot surgery Right 2012?    "took piece off that was hurting me" (03/30/2013)  . Spinal cord stimulator implant  11/14/2012  . Anterior cervical decomp/discectomy fusion  04/05/2012    Procedure: ANTERIOR CERVICAL DECOMPRESSION/DISCECTOMY FUSION 1 LEVEL;  Surgeon: Elaina Hoops, MD;  Location: [facility];  Service: Neurosurgery;  Laterality: N/A;  Cervical five-six anterior cervical decompression and fusion  . Posterior lumbar fusion      "L4-5" (03/31/2103)  . Hardware removal      "took screw out of my back; it had damaged my nerve" (03/30/2013)  . Incision and  drainage abscess Left     "lower jaw, tooth abscess" (03/30/2013)  . Left heart catheterization with coronary angiogram N/A 08/03/2014    Procedure: LEFT HEART CATHETERIZATION WITH CORONARY ANGIOGRAM;  Surgeon: Blane Ohara, MD;  Location: [facility] CATH LAB;  Service: Cardiovascular;  Laterality: N/A;     Current Outpatient Prescriptions  Medication Sig Dispense Refill  . albuterol (PROVENTIL HFA;VENTOLIN HFA) 108 (90 BASE) MCG/ACT inhaler Inhale 2-4 puffs into the lungs every 6 (six) hours as needed for wheezing or shortness of breath. 1 Inhaler 0  . aspirin EC 81 MG EC tablet Take 1 tablet (81 mg total) by mouth daily. 30 tablet 0  . carvedilol (COREG) 6.25 MG tablet Take 1 tablet (6.25 mg total) by mouth 2 (two) times daily with a meal. 60 tablet 6  . diazepam (VALIUM) 5 MG tablet Take 5 mg by mouth daily as needed for anxiety.      . furosemide (LASIX) 40 MG tablet Take 1 tablet (40 mg total) by mouth 2 (two) times daily. Take 1 1/2 tab two times a day to = 60 mg two times a day 90 tablet 7  . gemfibrozil (LOPID) 600 MG tablet Take 1 tablet (600 mg total) by mouth 2 (two) times daily before a meal. 60 tablet 5  . losartan (COZAAR) 50 MG tablet Take 50 mg by mouth daily.    [name] lovastatin (MEVACOR) 40 MG tablet Take 1 tablet (40 mg total) by mouth daily. 30 tablet 2  . Multiple Vitamins-Minerals (MULTIVITAMIN PO) Take 1 tablet by mouth daily.    [name] omega-3 acid ethyl esters (LOVAZA) 1 G capsule Take 1 capsule (1 g total) by mouth 2 (two) times daily. 60 capsule 5  . omeprazole (PRILOSEC) 40 MG capsule Take 1 capsule (40 mg total) by mouth daily. 30 capsule 0  . Oxycodone HCl 20 MG TABS Take 1 tablet by mouth every 4 (four) hours as needed (as needed for pain).    . potassium chloride SA (K-DUR,KLOR-CON) 20 MEQ tablet Take 1 tablet (20 mEq total) by mouth daily. 30 tablet 0  . sertraline (ZOLOFT) 100 MG tablet Take 100 mg by mouth daily.    [name] testosterone cypionate (DEPOTESTOTERONE CYPIONATE) 200 MG/ML injection Inject 200 mg into the muscle once a week. Once per week     No current facility-administered medications for this visit.    Allergies:   Hydrocodone-acetaminophen    Social History:  The patient  reports that he has been smoking Cigarettes.  He has a 20 pack-year smoking history. He has never used smokeless tobacco. He reports that he drinks about 2.4 oz of alcohol per week. He reports that he does not use illicit drugs.   Family History:  The patient's family history includes Bone cancer in his mother; Breast cancer in his sister; Heart disease in his father and sister. There is no history of Esophageal cancer or Stomach cancer.    ROS:  General:no colds or fevers, no weight changes down slightly from last visit Skin:no rashes or ulcers HEENT:no blurred vision, no congestion CV:see HPI PUL:see HPI GI:no  diarrhea constipation or melena, no indigestion GU:no hematuria, no dysuria MS:no joint pain, no claudication Neuro:no syncope, no lightheadedness Endo:no diabetes, no thyroid disease  Wt Readings from Last 3 Encounters:  09/28/14 213 lb 3.2 oz (96.707 kg)  09/14/14 214 lb 1.6 oz (97.115 kg)  08/10/14 208 lb (94.348 kg)     PHYSICAL EXAM: VS:  BP 122/86 mmHg  Pulse 78  Ht 5\' 10"  (1.778 m)  Wt 213 lb 3.2 oz (96.707 kg)  BMI 30.59 kg/m2 , BMI Body mass index is 30.59 kg/(m^2). General:Pleasant affect, NAD Skin:Warm and dry, brisk capillary refill HEENT:normocephalic, sclera clear, mucus membranes moist Neck:supple, no JVD, no bruits  Heart:S1S2 RRR without murmur, gallup, rub or click Lungs:clear without rales, rhonchi, or wheezes AFB:XUXY, non tender, + BS, do not palpate liver spleen or masses Ext:no lower ext edema, 2+ pedal pulses, 2+ radial pulses Neuro:alert and oriented, MAE, follows commands, + facial symmetry    EKG:  EKG is ordered today. The ekg ordered today demonstrates SR at 78 1st degree AV block with T wave inversions V4-6.  No changes from 08/10/14   Recent Labs: 07/30/2014: B Natriuretic Peptide 1191.1* 08/03/2014: Hemoglobin 18.4*; Platelets 168 08/10/2014: ALT 46 09/26/2014: BUN 10; Creatinine 1.09; Potassium 3.8; Sodium 139    Lipid Panel    Component Value Date/Time   CHOL 329* 03/29/2013 1238   TRIG 410* 05/26/2013 1443   HDL 21* 03/29/2013 1238   CHOLHDL 15.7 03/29/2013 1238   VLDL NOT CALC 03/29/2013 1238   LDLCALC NOT CALC 03/29/2013 1238   LDLDIRECT 89 12/09/2012 1225       Other studies Reviewed: Additional studies/ records that were reviewed today include: previous notes and echo.   ASSESSMENT AND PLAN:  Chronic systolic CHF - We again discussed salt and fluid restriction and daily weights. He is still getting too much salt finances play a role in this. Increase his Lasix to 100 mg BID for 2 days and K+ to 2 tabs for 2 days then back  to Lasix 60 mg twice daily and K+ 20 mg daily. continue beta blocker at 6.25 twice a day he had run out for 2 days. Back in 2 weeks for follow up and possible titration of BB.  His BMP done on the 1st is stable. He will need follow up echo in April this will be arranged on next visit.  Essential HTN - This is being managed in the context of treating his CHF and stable  CKD stage II - These will be checked as above. 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   Alcohol abuse with recent transaminitis - I again discussed the mportance of abstinence from this toxic substance.  Tobacco abuse, discussed need to decrease and stop.  He will no longer smoke int he house or car.    Current medicines are reviewed with the patient today.  The patient Has no concerns regarding medicines.  The following changes have been made:  See above Labs/ tests ordered today include:see above  Disposition:   FU:  see above  Signed, Isaiah Serge, NP  09/28/2014 4:20 PM    [facility] [place] [address], [place], [place], [place] Phone: [phone]; Fax: [phone]

## 2014-09-28 NOTE — Patient Instructions (Signed)
Please follow up with an extender in 2 weeks.   On Friday and Saturday please take 2 and a half Lasix pills, twice a day. Also take 2 Potassium pills, once a day.  On Sunday return to your normal dose of 1 and a half Lasix pills, twice a day. Return to taking 1 Potassium pill, once a day.

## 2014-10-17 ENCOUNTER — Ambulatory Visit: Payer: Medicare Other | Admitting: Cardiology

## 2014-10-17 ENCOUNTER — Telehealth: Payer: Self-pay | Admitting: Cardiology

## 2014-10-18 ENCOUNTER — Encounter: Payer: Self-pay | Admitting: Cardiology

## 2014-10-18 ENCOUNTER — Ambulatory Visit (INDEPENDENT_AMBULATORY_CARE_PROVIDER_SITE_OTHER): Payer: Medicare Other | Admitting: Cardiology

## 2014-10-18 ENCOUNTER — Ambulatory Visit: Payer: Medicare Other | Admitting: Cardiology

## 2014-10-18 VITALS — BP 120/78 | HR 69 | Ht 70.0 in | Wt 214.2 lb

## 2014-10-18 DIAGNOSIS — Z79899 Other long term (current) drug therapy: Secondary | ICD-10-CM

## 2014-10-18 DIAGNOSIS — I429 Cardiomyopathy, unspecified: Secondary | ICD-10-CM | POA: Diagnosis not present

## 2014-10-18 DIAGNOSIS — I428 Other cardiomyopathies: Secondary | ICD-10-CM

## 2014-10-18 LAB — BASIC METABOLIC PANEL
BUN: 16 mg/dL (ref 6–23)
CO2: 29 mEq/L (ref 19–32)
Calcium: 9.3 mg/dL (ref 8.4–10.5)
Chloride: 97 mEq/L (ref 96–112)
Creat: 1.18 mg/dL (ref 0.50–1.35)
Glucose, Bld: 105 mg/dL — ABNORMAL HIGH (ref 70–99)
Potassium: 4.3 mEq/L (ref 3.5–5.3)
Sodium: 137 mEq/L (ref 135–145)

## 2014-10-18 NOTE — Patient Instructions (Addendum)
Your physician recommends that you return for lab work in: TODAY at [facility].  Your physician recommends that you schedule a follow-up appointment in: 1-2 weeks Dr. Percival Spanish, Lyda Jester, PA or Cecilie Kicks, NP.  If nothing available schedule at the flex clinic at [place] has requested that you have an echocardiogram in one month. Echocardiography is a painless test that uses sound waves to create images of your heart. It provides your doctor with information about the size and shape of your heart and how well your heart's chambers and valves are working. This procedure takes approximately one hour. There are no restrictions for this procedure.

## 2014-10-18 NOTE — Telephone Encounter (Signed)
Close encounter 

## 2014-10-18 NOTE — Progress Notes (Signed)
10/19/2014 Jerome Adams   12-26-1960  [id number]  Primary Physician Antonietta Jewel, MD Primary Cardiologist: Dr. Percival Spanish  Reason for Visit/CC: Follow up for Nonischemic Cardiomyopathy  HPI:  The patient is a 54 y/o AAM, recently diagnosed with nonischemic cardiomyopathy with EF of 20-25% on echo 07/2013. LHC showed nonobstructive CAD. 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 He presents to clinic today for follow-up. He was seen 2 weeks ago by Cecilie Kicks, NP, and he noted weight gain and was mildly volume overloaded on exam. This was in the setting of dietary indiscretion. She opted to increase his Lasix to 100 mg BID x 2 days. He was instructed to resume 60 mg BID thereafter. He presents back for follow-up for this as well as for medication titration. He is due for repeat 2D echo 10/2014.  Today in clinic, he notes no success in weight loss, but denies any significant weight gain since his last visit. He continues to have abdominal distentsion, but no LEE. He is asymptomatic at rest but continues to have DOE and 2 pillow orthopnea. He feels that he is not diuresing as much as he should with his current dose of lasix. No CP, palpitations, dizziness, syncope/ near syncope. He reports full medication compliance. He admits to doing better in regards to his diet and has cut back on high sodium foods. He has discontinued tobacco abuse but unfortunately continues to smoke 1ppd.       Current Outpatient Prescriptions  Medication Sig Dispense Refill  . albuterol (PROVENTIL HFA;VENTOLIN HFA) 108 (90 BASE) MCG/ACT inhaler Inhale 2-4 puffs into the lungs every 6 (six) hours as needed for wheezing or shortness of breath. 1 Inhaler 0  . aspirin EC 81 MG EC tablet Take 1 tablet (81 mg total) by mouth daily. 30 tablet 0  . carvedilol (COREG) 6.25 MG tablet Take 1 tablet (6.25 mg total) by mouth 2 (two) times daily with a meal. 60 tablet 6  . furosemide (LASIX) 40 MG tablet  Take 1 tablet (40 mg total) by mouth 2 (two) times daily. Take 1 1/2 tab two times a day to = 60 mg two times a day 90 tablet 7  . gemfibrozil (LOPID) 600 MG tablet Take 1 tablet (600 mg total) by mouth 2 (two) times daily before a meal. 60 tablet 5  . losartan (COZAAR) 50 MG tablet Take 50 mg by mouth daily.    [name] lovastatin (MEVACOR) 40 MG tablet Take 1 tablet (40 mg total) by mouth daily. 30 tablet 2  . Multiple Vitamins-Minerals (MULTIVITAMIN PO) Take 1 tablet by mouth daily.    [name] omega-3 acid ethyl esters (LOVAZA) 1 G capsule Take 1 capsule (1 g total) by mouth 2 (two) times daily. 60 capsule 5  . omeprazole (PRILOSEC) 40 MG capsule Take 1 capsule (40 mg total) by mouth daily. 30 capsule 0  . Oxycodone HCl 20 MG TABS Take 1 tablet by mouth every 4 (four) hours as needed (as needed for pain).    [name] sertraline (ZOLOFT) 100 MG tablet Take 100 mg by mouth daily.    [name] testosterone cypionate (DEPOTESTOTERONE CYPIONATE) 200 MG/ML injection Inject 200 mg into the muscle once a week. Once per week    . diazepam (VALIUM) 10 MG tablet Take 1 tablet by mouth daily as needed.    [name] spironolactone (ALDACTONE) 25 MG tablet Take 0.5 tablets (12.5 mg total) by mouth daily. 30 tablet 3   No current facility-administered medications for this  visit.    Allergies  Allergen Reactions  . Hydrocodone-Acetaminophen Itching    History   Social History  . Marital Status: Divorced    Spouse Name: N/A  . Number of Children: 1  . Years of Education: N/A   Occupational History  . UNEMPLOYEED     on disability.    Social History Main Topics  . Smoking status: Current Some Day Smoker -- 0.50 packs/day for 40 years    Types: Cigarettes  . Smokeless tobacco: Never Used     Comment: 8 cig per week  . Alcohol Use: 2.4 oz/week    4 Cans of beer per week     Comment: Has been drinking since age 78.  Has averaged 4 22 oz cans of beer daily until late 2015.  . Drug Use: No     Comment: 03/30/2013 "[place]"   . Sexual Activity: Not on file   Other Topics Concern  . Not on file   Social History Narrative   Lives in [place] by himself.  Does not routinely exercise.     Review of Systems: General: negative for chills, fever, night sweats or weight changes.  Cardiovascular: negative for chest pain, dyspnea on exertion, edema, orthopnea, palpitations, paroxysmal nocturnal dyspnea or shortness of breath Dermatological: negative for rash Respiratory: negative for cough or wheezing Urologic: negative for hematuria Abdominal: negative for nausea, vomiting, diarrhea, bright red blood per rectum, melena, or hematemesis Neurologic: negative for visual changes, syncope, or dizziness All other systems reviewed and are otherwise negative except as noted above.    Blood pressure 120/78, pulse 69, height 5\' 10"  (1.778 m), weight 214 lb 3.2 oz (97.16 kg).  General appearance: alert, cooperative and no distress Neck: no carotid bruit and no JVD Lungs: clear to auscultation bilaterally Heart: regular rate and rhythm, S1, S2 normal, no murmur, click, rub or gallop Extremities: no LEE Pulses: 2+ and symmetric Skin: warm and dry Neurologic: Grossly normal  EKG not performed  ASSESSMENT AND PLAN:   1. Chronic Systolic CHF in the Setting of NICM: EF 20-25%. Lungs are CTAB and no LEE, but patient continues to note fluid retention in abdomen. He had little success with temporary increased dose of Lasix and feels that UOP is not as high as it should be. He notes improved compliance regarding low sodium diet. Asymptomatic at rest but with DOE. He is currently on BB therapy with Coreg and ARB therapy with losartan. BP is 120/78 and HR is 69 bpm. I feel that he would most benefit from aldosterone blockade.  I checked BMP today and have reviewed the results. Scr is <2.5 at 1.18. K is stable at 4.3. Will add low dose spironolactone, 12.5 mg daily. He will continue current dose of losartan and coreg. He has been  instructed to hold further use of supplemental K for now to avoid hyperkalemia, given ARB and addition of potassium sparing diuretic. He has been instructed to return in 1 week for repet BMP. He will f/u with MD or APP in 2 weeks for reassessment and further medication titration.   PLAN  F/u in 1-2 weeks for reassessment and further medication titration. Recheck BMP in 1 week to reassesses renal function and K after spironolactone initiation.    SIMMONS, BRITTAINYPA-C 10/19/2014 2:17 PM

## 2014-10-19 ENCOUNTER — Telehealth: Payer: Self-pay | Admitting: *Deleted

## 2014-10-19 DIAGNOSIS — Z79899 Other long term (current) drug therapy: Secondary | ICD-10-CM

## 2014-10-19 MED ORDER — SPIRONOLACTONE 25 MG PO TABS: 12.5000 mg | ORAL_TABLET | Freq: Every day | ORAL | Status: DC

## 2014-10-19 NOTE — Telephone Encounter (Signed)
Called lab results to patient.  States he had already been contacted and told to take spironolactone 12.5mg  daily and stop potassium.  Will get lab rechecked in 1 week and keep appt with Dr. Percival Spanish 11/02/14.

## 2014-10-19 NOTE — Telephone Encounter (Signed)
Called and notified patient of lab results and recommendations. Patient verbally voiced understanding of the orders. spironlactone sent in to  [facility] [address]. Lab order placed.

## 2014-10-19 NOTE — Telephone Encounter (Signed)
-----   Message from [place], [place] sent at 10/19/2014 12:59 PM EDT ----- Renal function is normal. Start 12.5 mg of Spironolactone once daily for HF. STOP supplemental potassium. Continue all other medications. Return in 1 week for repeat BMP. F/U with MD or APP in 2 weeks.

## 2014-10-19 NOTE — Telephone Encounter (Signed)
-----   Message from [place], [place] sent at 10/19/2014 12:59 PM EDT ----- Renal function is normal. Start 12.5 mg of Spironolactone once daily for HF. STOP supplemental potassium. Continue all other medications. Return in 1 week for repeat BMP. F/U with MD or APP in 2 weeks.

## 2014-10-26 ENCOUNTER — Other Ambulatory Visit: Payer: Self-pay | Admitting: *Deleted

## 2014-10-26 MED ORDER — CARVEDILOL 6.25 MG PO TABS: 6.2500 mg | ORAL_TABLET | Freq: Two times a day (BID) | ORAL | Status: DC

## 2014-10-26 NOTE — Telephone Encounter (Signed)
Rx refill sent to patient pharmacy for 90 day supply.  

## 2014-11-02 ENCOUNTER — Encounter: Payer: Self-pay | Admitting: Cardiology

## 2014-11-02 ENCOUNTER — Ambulatory Visit (INDEPENDENT_AMBULATORY_CARE_PROVIDER_SITE_OTHER): Payer: Medicare Other | Admitting: Cardiology

## 2014-11-02 VITALS — BP 120/70 | HR 68 | Ht 70.0 in | Wt 216.0 lb

## 2014-11-02 DIAGNOSIS — I10 Essential (primary) hypertension: Secondary | ICD-10-CM | POA: Diagnosis not present

## 2014-11-02 DIAGNOSIS — I5022 Chronic systolic (congestive) heart failure: Secondary | ICD-10-CM

## 2014-11-02 MED ORDER — TORSEMIDE 20 MG PO TABS: 40.0000 mg | ORAL_TABLET | Freq: Every day | ORAL | Status: DC

## 2014-11-02 MED ORDER — VARENICLINE TARTRATE 1 MG PO TABS: 1.0000 mg | ORAL_TABLET | Freq: Two times a day (BID) | ORAL | Status: DC

## 2014-11-02 NOTE — Progress Notes (Signed)
Cardiology Office Note   Date:  11/02/2014   ID:  BLESS BELSHE, DOB 12-12-1960, MRN [id number]  PCP:  Antonietta Jewel, MD  Cardiologist:   Minus Breeding, MD   Chief Complaint  Patient presents with  . Palpitations    History of Present Illness: Jerome Adams is a 54 y.o. male who presents for follow up of dilated cardiomyopathy thought to be related probably to alcohol abuse. He had nonobstructive disease on left heart catheterization in early January. When he was seen initially after his hospitalization he had had some weight gain because of salt dietary indiscretion.   At the last visit I thought he had some increased volume and I increased his diuretic. I also titrated his beta blocker. However, his weight is up.  He's trying to watch his salt though he eats out.   He still has some dyspnea with activities but he does not describe new PND or orthopnea. He thinks he has some increasing abdominal girth which is where he thinks it contains his fluid. He does not have swelling.  He denies any new chest pressure, neck or arm discomfort. He said no new palpitations, presyncope or syncope.  Past Medical History  Diagnosis Date  . PUD (peptic ulcer disease)   . Essential hypertension   . Allergy   . GERD (gastroesophageal reflux disease)     takes  otc  . Spinal cord stimulator status     has had for 1 yr--inserted by [facility]  . Sleep apnea     was tested here at [facility].Marland KitchenMarland KitchenMarland KitchenNever heard anymore about it (03/30/2013)  . Arthritis     "joints" (03/30/2013)  . Chronic lower back pain   . Depression   . Hepatitis C 2010    a. never treated - undectable virus 12/2013.  [name] Chronic pancreatitis   . NICM (nonischemic cardiomyopathy)     a. 12/2007 Echo: EF 45-50%;  b. 07/2014 Echo: EF 20-25%, diff HK, mod dil LA, sev dil RA, mild TR, PASP 31 mmHg.  [name] Alcohol abuse   . Drug abuse     a. Remote drug abuse (cocaine). UDS 07/2014 neg for cocaine.  . Transaminitis   . Tobacco abuse   .  Hyperlipidemia   . Hypertriglyceridemia   . Chronic pain syndrome   . CKD (chronic kidney disease), stage II     Past Surgical History  Procedure Laterality Date  . Back surgery    . Wrist surgery Right 1980's    repair of tendons and nerves  . Foot surgery Right 2012?    "took piece off that was hurting me" (03/30/2013)  . Spinal cord stimulator implant  11/14/2012  . Anterior cervical decomp/discectomy fusion  04/05/2012    Procedure: ANTERIOR CERVICAL DECOMPRESSION/DISCECTOMY FUSION 1 LEVEL;  Surgeon: Elaina Hoops, MD;  Location: [place] [facility];  Service: Neurosurgery;  Laterality: N/A;  Cervical five-six anterior cervical decompression and fusion  . Posterior lumbar fusion      "L4-5" (03/31/2103)  . Hardware removal      "took screw out of my back; it had damaged my nerve" (03/30/2013)  . Incision and drainage abscess Left     "lower jaw, tooth abscess" (03/30/2013)  . Left heart catheterization with coronary angiogram N/A 08/03/2014    Procedure: LEFT HEART CATHETERIZATION WITH CORONARY ANGIOGRAM;  Surgeon: Blane Ohara, MD;  Location: [facility] CATH LAB;  Service: Cardiovascular;  Laterality: N/A;     Current Outpatient Prescriptions  Medication Sig Dispense  Refill  . albuterol (PROVENTIL HFA;VENTOLIN HFA) 108 (90 BASE) MCG/ACT inhaler Inhale 2-4 puffs into the lungs every 6 (six) hours as needed for wheezing or shortness of breath. 1 Inhaler 0  . aspirin EC 81 MG EC tablet Take 1 tablet (81 mg total) by mouth daily. 30 tablet 0  . carvedilol (COREG) 6.25 MG tablet Take 1 tablet (6.25 mg total) by mouth 2 (two) times daily with a meal. 180 tablet 3  . diazepam (VALIUM) 10 MG tablet Take 1 tablet by mouth daily as needed.    . furosemide (LASIX) 40 MG tablet Take 1 tablet (40 mg total) by mouth 2 (two) times daily. Take 1 1/2 tab two times a day to = 60 mg two times a day 90 tablet 7  . gemfibrozil (LOPID) 600 MG tablet Take 1 tablet (600 mg total) by mouth 2 (two) times daily before a meal. 60  tablet 5  . losartan (COZAAR) 50 MG tablet Take 50 mg by mouth daily.    [name] lovastatin (MEVACOR) 40 MG tablet Take 1 tablet (40 mg total) by mouth daily. 30 tablet 2  . Multiple Vitamins-Minerals (MULTIVITAMIN PO) Take 1 tablet by mouth daily.    [name] omega-3 acid ethyl esters (LOVAZA) 1 G capsule Take 1 capsule (1 g total) by mouth 2 (two) times daily. 60 capsule 5  . omeprazole (PRILOSEC) 40 MG capsule Take 1 capsule (40 mg total) by mouth daily. 30 capsule 0  . oxycodone (ROXICODONE) 30 MG immediate release tablet Take 1 tablet by mouth 4 (four) times daily.    . sertraline (ZOLOFT) 100 MG tablet Take 100 mg by mouth daily.    [name] spironolactone (ALDACTONE) 25 MG tablet Take 0.5 tablets (12.5 mg total) by mouth daily. 30 tablet 3  . testosterone cypionate (DEPOTESTOTERONE CYPIONATE) 200 MG/ML injection Inject 200 mg into the muscle once a week. Once per week     No current facility-administered medications for this visit.    Allergies:   Hydrocodone-acetaminophen    ROS:  Please see the history of present illness.   Otherwise, review of systems are positive for none.   All other systems are reviewed and negative.    PHYSICAL EXAM: VS:  BP 120/70 mmHg  Pulse 68  Ht 5\' 10"  (1.778 m)  Wt 216 lb (97.977 kg)  BMI 30.99 kg/m2 , BMI Body mass index is 30.99 kg/(m^2). GENERAL:  Well appearing HEENT:  Pupils equal round and reactive, fundi not visualized, oral mucosa unremarkable NECK:  Positive jugular venous distention, waveform within normal limits, carotid upstroke brisk and symmetric, no bruits, no thyromegaly LYMPHATICS:  No cervical, inguinal adenopathy LUNGS:  Clear to auscultation bilaterally BACK:  No CVA tenderness CHEST:  Unremarkable HEART:  PMI not displaced or sustained,S1 and S2 within normal limits, no S3, no S4, no clicks, no rubs, no murmurs ABD:  Flat, positive bowel sounds normal in frequency in pitch, no bruits, no rebound, no guarding, no midline pulsatile mass, no  hepatomegaly, positive splenomegaly EXT:  2 plus pulses throughout, no edema, no cyanosis no clubbing SKIN:  No rashes no nodules   EKG:  EKG is not ordered today.   Recent Labs: 07/30/2014: B Natriuretic Peptide 1191.1* 08/03/2014: Hemoglobin 18.4*; Platelets 168 08/10/2014: ALT 46 10/18/2014: BUN 16; Creatinine 1.18; Potassium 4.3; Sodium 137     Wt Readings from Last 3 Encounters:  11/02/14 216 lb (97.977 kg)  10/18/14 214 lb 3.2 oz (97.16 kg)  09/28/14 213 lb 3.2 oz (96.707  kg)      Other studies Reviewed: Additional studies/ records that were reviewed today include:  None.   ASSESSMENT AND PLAN:  Chronic systolic CHF -    I think he still volume overloaded and his weight didn't come down with increased Lasix. I'm going to change to torsemide 40 mg twice daily. He will get a basic metabolic profile today and again in 2 weeks. I'll leave his other medicines as they are he will come back for med titration in 2 weeks.  Essential HTN - This is being managed in the context of treating his CHF  CKD stage II -   These will be checked as above. We will follow his lab work closely.  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   Alcohol abuse with recent transaminitis - I again reinforced importance of abstinence.  He says he continues not to drink.  Tobacco abuse - He does want to try Chantix.  We discussed all of the potential side effects and in particular I reviewed the [employer].  He has no depression.  He understands and wishes to try this medication.   Current medicines are reviewed at length with the patient today.  The patient does not have concerns regarding medicines.  The following changes have been made:  Med changes as above  Labs/ tests ordered today include: BMET  No orders of the defined types were placed in this encounter.    Disposition:   FU with in 10 days.    Signed, Minus Breeding, MD  11/02/2014 3:30 PM    [place]

## 2014-11-02 NOTE — Patient Instructions (Signed)
STOP LASIX  START Torsemide 40mg  TWICE Daily  START Chantix   Your physician recommends that you have lab work drawn today as well as return for lab work in 7 days  Your physician recommends that you schedule a follow-up appointment in 2 weeks with Dr.Hochrein or an extender ([facility] or [place])

## 2014-11-03 LAB — BASIC METABOLIC PANEL
BUN: 21 mg/dL (ref 6–23)
CO2: 31 mEq/L (ref 19–32)
Calcium: 10.1 mg/dL (ref 8.4–10.5)
Chloride: 95 mEq/L — ABNORMAL LOW (ref 96–112)
Creat: 1.38 mg/dL — ABNORMAL HIGH (ref 0.50–1.35)
Glucose, Bld: 96 mg/dL (ref 70–99)
Potassium: 4.7 mEq/L (ref 3.5–5.3)
Sodium: 138 mEq/L (ref 135–145)

## 2014-11-10 LAB — BASIC METABOLIC PANEL
BUN: 10 mg/dL (ref 6–23)
CO2: 28 mEq/L (ref 19–32)
Calcium: 9.4 mg/dL (ref 8.4–10.5)
Chloride: 101 mEq/L (ref 96–112)
Creat: 1.02 mg/dL (ref 0.50–1.35)
Glucose, Bld: 113 mg/dL — ABNORMAL HIGH (ref 70–99)
Potassium: 4.5 mEq/L (ref 3.5–5.3)
Sodium: 139 mEq/L (ref 135–145)

## 2014-11-16 ENCOUNTER — Ambulatory Visit (INDEPENDENT_AMBULATORY_CARE_PROVIDER_SITE_OTHER): Payer: Medicare Other | Admitting: Cardiology

## 2014-11-16 ENCOUNTER — Encounter: Payer: Self-pay | Admitting: Cardiology

## 2014-11-16 VITALS — BP 140/100 | HR 70 | Ht 70.0 in | Wt 213.7 lb

## 2014-11-16 DIAGNOSIS — I420 Dilated cardiomyopathy: Secondary | ICD-10-CM | POA: Diagnosis not present

## 2014-11-16 MED ORDER — LOSARTAN POTASSIUM 50 MG PO TABS: 50.0000 mg | ORAL_TABLET | Freq: Two times a day (BID) | ORAL | Status: DC

## 2014-11-16 NOTE — Progress Notes (Signed)
Impingement     Cardiology Office Note   Date:  11/16/2014   ID:  ESTANISLADO SURGEON, DOB 09/17/1960, MRN [id number]  PCP:  Antonietta Jewel, MD  Cardiologist:   Minus Breeding, MD   Chief Complaint  Patient presents with  . Cardiomyopathy    History of Present Illness: Jerome Adams is a 54 y.o. male who presents for follow up of dilated cardiomyopathy thought to be related probably to alcohol abuse but he is not drinking any longer. He had nonobstructive disease on left heart catheterization in early January.  At the last  Visit his weight was up. I had wanted him to increase his torsemide but somehow this didn't happen. However, his weight is now back down. He's been good about salt and actually has been eating less. Had what he thinks is some upper respiratory infection over the last few days. He's not describing classic PND or orthopnea. He's not had any swelling. His weight is back down. He denies any chest pressure, neck or arm discomfort. He has no palpitations, presyncope or syncope.  Past Medical History  Diagnosis Date  . PUD (peptic ulcer disease)   . Essential hypertension   . Allergy   . GERD (gastroesophageal reflux disease)     takes  otc  . Spinal cord stimulator status     has had for 1 yr--inserted by [facility]  . Sleep apnea     was tested here at [facility].Marland KitchenMarland KitchenMarland KitchenNever heard anymore about it (03/30/2013)  . Arthritis     "joints" (03/30/2013)  . Chronic lower back pain   . Depression   . Hepatitis C 2010    a. never treated - undectable virus 12/2013.  [name] Chronic pancreatitis   . NICM (nonischemic cardiomyopathy)     a. 12/2007 Echo: EF 45-50%;  b. 07/2014 Echo: EF 20-25%, diff HK, mod dil LA, sev dil RA, mild TR, PASP 31 mmHg.  [name] Alcohol abuse   . Drug abuse     a. Remote drug abuse (cocaine). UDS 07/2014 neg for cocaine.  . Transaminitis   . Tobacco abuse   . Hyperlipidemia   . Hypertriglyceridemia   . Chronic pain syndrome   . CKD (chronic kidney disease), stage II      Past Surgical History  Procedure Laterality Date  . Back surgery    . Wrist surgery Right 1980's    repair of tendons and nerves  . Foot surgery Right 2012?    "took piece off that was hurting me" (03/30/2013)  . Spinal cord stimulator implant  11/14/2012  . Anterior cervical decomp/discectomy fusion  04/05/2012    Procedure: ANTERIOR CERVICAL DECOMPRESSION/DISCECTOMY FUSION 1 LEVEL;  Surgeon: Elaina Hoops, MD;  Location: [place] [facility]UR[facility] ORS;  Service: Neurosurgery;  Laterality: N/A;  Cervical five-six anterior cervical decompression and fusion  . Posterior lumbar fusion      "L4-5" (03/31/2103)  . Hardware removal      "took screw out of my back; it had damaged my nerve" (03/30/2013)  . Incision and drainage abscess Left     "lower jaw, tooth abscess" (03/30/2013)  . Left heart catheterization with coronary angiogram N/A 08/03/2014    Procedure: LEFT HEART CATHETERIZATION WITH CORONARY ANGIOGRAM;  Surgeon: Blane Ohara, MD;  Location: [facility] CATH LAB;  Service: Cardiovascular;  Laterality: N/A;     Current Outpatient Prescriptions  Medication Sig Dispense Refill  . albuterol (PROVENTIL HFA;VENTOLIN HFA) 108 (90 BASE) MCG/ACT inhaler Inhale 2-4 puffs into the lungs every 6 (  six) hours as needed for wheezing or shortness of breath. 1 Inhaler 0  . aspirin EC 81 MG EC tablet Take 1 tablet (81 mg total) by mouth daily. 30 tablet 0  . carvedilol (COREG) 6.25 MG tablet Take 1 tablet (6.25 mg total) by mouth 2 (two) times daily with a meal. 180 tablet 3  . diazepam (VALIUM) 10 MG tablet Take 1 tablet by mouth daily as needed.    [name] gemfibrozil (LOPID) 600 MG tablet Take 1 tablet (600 mg total) by mouth 2 (two) times daily before a meal. 60 tablet 5  . losartan (COZAAR) 50 MG tablet Take 50 mg by mouth daily.    [name] lovastatin (MEVACOR) 40 MG tablet Take 1 tablet (40 mg total) by mouth daily. 30 tablet 2  . Multiple Vitamins-Minerals (MULTIVITAMIN PO) Take 1 tablet by mouth daily.    [name] omega-3 acid ethyl  esters (LOVAZA) 1 G capsule Take 1 capsule (1 g total) by mouth 2 (two) times daily. 60 capsule 5  . omeprazole (PRILOSEC) 40 MG capsule Take 1 capsule (40 mg total) by mouth daily. 30 capsule 0  . oxycodone (ROXICODONE) 30 MG immediate release tablet Take 1 tablet by mouth 4 (four) times daily.    . sertraline (ZOLOFT) 100 MG tablet Take 100 mg by mouth daily.    [name] spironolactone (ALDACTONE) 25 MG tablet Take 0.5 tablets (12.5 mg total) by mouth daily. 30 tablet 3  . testosterone cypionate (DEPOTESTOTERONE CYPIONATE) 200 MG/ML injection Inject 200 mg into the muscle once a week. Once per week    . torsemide (DEMADEX) 20 MG tablet Take 2 tablets (40 mg total) by mouth daily. 120 tablet 5  . varenicline (CHANTIX CONTINUING MONTH PAK) 1 MG tablet Take 1 tablet (1 mg total) by mouth 2 (two) times daily. 60 tablet 2   No current facility-administered medications for this visit.    Allergies:   Hydrocodone-acetaminophen    ROS:  Please see the history of present illness.   Otherwise, review of systems are positive for none.   All other systems are reviewed and negative.    PHYSICAL EXAM: VS:  BP 140/100 mmHg  Pulse 70  Ht '5\' 10"'  (1.778 m)  Wt 213 lb 11.2 oz (96.934 kg)  BMI 30.66 kg/m2 , BMI Body mass index is 30.66 kg/(m^2). GENERAL:  Well appearing HEENT:  Pupils equal round and reactive, fundi not visualized, oral mucosa unremarkable NECK:  No jugular venous distention, waveform within normal limits, carotid upstroke brisk and symmetric, no bruits, no thyromegaly LYMPHATICS:  No cervical, inguinal adenopathy LUNGS:  Clear to auscultation bilaterally BACK:  No CVA tenderness CHEST:  Unremarkable HEART:  PMI not displaced or sustained,S1 and S2 within normal limits, no S3, no S4, no clicks, no rubs, no murmurs ABD:  Flat, positive bowel sounds normal in frequency in pitch, no bruits, no rebound, no guarding, no midline pulsatile mass, no hepatomegaly, positive splenomegaly EXT:  2 plus  pulses throughout, no edema, no cyanosis no clubbing SKIN:  No rashes no nodules   EKG:  EKG is not ordered today.   Recent Labs: 07/30/2014: B Natriuretic Peptide 1191.1* 08/03/2014: Hemoglobin 18.4*; Platelets 168 08/10/2014: ALT 46 11/09/2014: BUN 10; Creatinine 1.02; Potassium 4.5; Sodium 139     Wt Readings from Last 3 Encounters:  11/16/14 213 lb 11.2 oz (96.934 kg)  11/02/14 216 lb (97.977 kg)  10/18/14 214 lb 3.2 oz (97.16 kg)      Other studies Reviewed: Additional studies/ records  that were reviewed today include:  None.   ASSESSMENT AND PLAN:  Chronic systolic CHF -     I don't see evidence of volume overload today. He can remain on the dose of diuretic that he is currently taking. I'm going to adjust his medicines otherwise. Of note he was an echo scheduled but I will wait to see how he responds after I completed med titration. Therefore, we will cancel this echo.  Essential HTN - This is being managed in the context of treating his CHF. I will increase hs Cozaar to 50 mg twice daily.  CKD stage II -   These will be checked as above. We will follow his lab work closely.   We will get be met when he returns.  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   Alcohol abuse with recent transaminitis - I again reinforced importance of abstinence.  He says he continues not to drink.  Tobacco abuse - He  Has the Chantix  But has not yet started it.  We discussed all of the potential side effects and in particular I reviewed the [employer] Warning  At the previous appointment.  He has no depression.  He will start this.   Current medicines are reviewed at length with the patient today.  The patient does not have concerns regarding medicines.  The following changes have been made:  Med changes as above  Labs/ tests ordered today include: None    Disposition:   FU with me or APP in 3 weeks.    Signed, Minus Breeding, MD  11/16/2014 11:10 AM    [facility]     pain 3 days as

## 2014-11-16 NOTE — Patient Instructions (Signed)
Increase Losartan to  50mg  Twice daily Continue Torsemide at 40mg  daily  Your physician recommends that you schedule a follow-up appointment in: 3 weeks with Dr.Hochrein or an extender

## 2014-11-20 ENCOUNTER — Ambulatory Visit (INDEPENDENT_AMBULATORY_CARE_PROVIDER_SITE_OTHER): Payer: Medicare Other | Admitting: Pulmonary Disease

## 2014-11-20 ENCOUNTER — Encounter: Payer: Self-pay | Admitting: Pulmonary Disease

## 2014-11-20 VITALS — BP 138/72 | HR 58 | Temp 97.6°F | Ht 70.0 in | Wt 212.0 lb

## 2014-11-20 DIAGNOSIS — G2581 Restless legs syndrome: Secondary | ICD-10-CM | POA: Diagnosis not present

## 2014-11-20 DIAGNOSIS — G4733 Obstructive sleep apnea (adult) (pediatric): Secondary | ICD-10-CM

## 2014-11-20 NOTE — Patient Instructions (Signed)
Will schedule for home sleep testing to see if your sleep apnea has worsened over time or if this is an oxygen issue at night. Will call you with the results.

## 2014-11-20 NOTE — Assessment & Plan Note (Signed)
The patient has a history of minimal obstructive sleep apnea from his study in 2014, and has not gained significant weight since that time. He is now having issues with orthopnea, and this may simply be related to his known cardiomyopathy with heart failure. However, his bed partner has also commented on witnessed apneas and gasping. I would like to do a follow-up sleep study because of his symptoms, to try and evaluate if this is obstructive apneas, central apneas, or simply nocturnal desaturations.

## 2014-11-20 NOTE — Addendum Note (Signed)
Addended by: Inge Rise on: 11/20/2014 11:32 AM   Modules accepted: Orders

## 2014-11-20 NOTE — Progress Notes (Signed)
   Subjective:    Patient ID: Jerome Adams, male    DOB: Nov 27, 1960, 54 y.o.   MRN: [id number]  HPI Patient comes in today for worsening symptoms of breathing during sleep. He has had a sleep study in 2014 that showed minimal sleep apnea, and also has a history of a cardiomyopathy with congestive heart failure. He has been noting worsening orthopnea, but feels that his fluid balance is adequately controlled. His bed partner has witnessed increasing gasping episodes.   Review of Systems  Constitutional: Negative for fever and unexpected weight change.  HENT: Negative for congestion, dental problem, ear pain, nosebleeds, postnasal drip, rhinorrhea, sinus pressure, sneezing, sore throat and trouble swallowing.   Eyes: Negative for redness and itching.  Respiratory: Positive for shortness of breath. Negative for cough, chest tightness and wheezing.   Cardiovascular: Negative for palpitations and leg swelling.  Gastrointestinal: Negative for nausea and vomiting.  Genitourinary: Negative for dysuria.  Musculoskeletal: Negative for joint swelling.  Skin: Negative for rash.  Neurological: Negative for headaches.  Hematological: Does not bruise/bleed easily.  Psychiatric/Behavioral: Negative for dysphoric mood. The patient is not nervous/anxious.        Objective:   Physical Exam Overweight male in no acute distress Nose without purulence or discharge noted Neck without lymphadenopathy or thyromegaly Lower extremities with mild edema, no cyanosis Alert and oriented, moves all 4 extremities.       Assessment & Plan:

## 2014-11-20 NOTE — Assessment & Plan Note (Signed)
The patient feels that his restless leg symptoms have greatly improved since being off alcohol. He no longer takes his dopamine agonist, and feels that it is not a problem.

## 2014-11-21 ENCOUNTER — Ambulatory Visit (HOSPITAL_COMMUNITY): Payer: Medicare Other

## 2014-12-06 NOTE — Progress Notes (Signed)
12/07/2014 STEIN [facility]   09-Jul-1961  [id number]  Primary Physician Antonietta Jewel, MD Primary Cardiologist: Dr. Percival Spanish  Reason for Visit/CC: Follow up for Nonischemic Cardiomyopathy  HPI:  The patient is a 54 y/o AAM, recently diagnosed with nonischemic cardiomyopathy with EF of 20-25% on echo 07/2014. LHC showed nonobstructive CAD. 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 He has discontinue alcohol use. He will undergo repeat 2D echo in several weeks.   He presents to clinic today for medication titration of his heart failure medicines. He was last seen by Dr. Percival Spanish 2 week ago and his losartan was increased. He has tolerated this adjustment well w/o side effects. BP is stable today at 122/88. HR is also stable at 76 bpm. He denies resting dyspnea. He has occasional DOE. No LEE. Fully compliant with meds and daily weights. Has occasional slip-ups with adherence with low sodium diet.    Current Outpatient Prescriptions  Medication Sig Dispense Refill  . albuterol (PROVENTIL HFA;VENTOLIN HFA) 108 (90 BASE) MCG/ACT inhaler Inhale 2-4 puffs into the lungs every 6 (six) hours as needed for wheezing or shortness of breath. 1 Inhaler 0  . aspirin EC 81 MG EC tablet Take 1 tablet (81 mg total) by mouth daily. 30 tablet 0  . carvedilol (COREG) 6.25 MG tablet Take 1 tablet (6.25 mg total) by mouth 2 (two) times daily with a meal. 180 tablet 3  . diazepam (VALIUM) 10 MG tablet Take 1 tablet by mouth daily as needed.    [name] gemfibrozil (LOPID) 600 MG tablet Take 1 tablet (600 mg total) by mouth 2 (two) times daily before a meal. 60 tablet 5  . losartan (COZAAR) 50 MG tablet Take 1 tablet (50 mg total) by mouth 2 (two) times daily. 180 tablet 3  . lovastatin (MEVACOR) 40 MG tablet Take 1 tablet (40 mg total) by mouth daily. 30 tablet 2  . Multiple Vitamins-Minerals (MULTIVITAMIN PO) Take 1 tablet by mouth daily.    [name] omega-3 acid ethyl esters (LOVAZA) 1 G capsule  Take 1 capsule (1 g total) by mouth 2 (two) times daily. 60 capsule 5  . omeprazole (PRILOSEC) 40 MG capsule Take 1 capsule (40 mg total) by mouth daily. 30 capsule 0  . oxycodone (ROXICODONE) 30 MG immediate release tablet Take 1 tablet by mouth 4 (four) times daily.    . sertraline (ZOLOFT) 100 MG tablet Take 100 mg by mouth daily.    [name] spironolactone (ALDACTONE) 25 MG tablet Take 0.5 tablets (12.5 mg total) by mouth daily. 30 tablet 3  . testosterone cypionate (DEPOTESTOTERONE CYPIONATE) 200 MG/ML injection Inject 200 mg into the muscle once a week. Once per week    . torsemide (DEMADEX) 20 MG tablet Take 2 tablets (40 mg total) by mouth daily. 120 tablet 5  . varenicline (CHANTIX CONTINUING MONTH PAK) 1 MG tablet Take 1 tablet (1 mg total) by mouth 2 (two) times daily. 60 tablet 2   No current facility-administered medications for this visit.    Allergies  Allergen Reactions  . Hydrocodone-Acetaminophen Itching    History   Social History  . Marital Status: Divorced    Spouse Name: N/A  . Number of Children: 1  . Years of Education: N/A   Occupational History  . UNEMPLOYEED     on disability.    Social History Main Topics  . Smoking status: Current Some Day Smoker -- 0.50 packs/day for 40 years    Types: Cigarettes  . Smokeless  tobacco: Never Used  . Alcohol Use: 2.4 oz/week    4 Cans of beer per week     Comment: Has been drinking since age 1.  Has averaged 4 22 oz cans of beer daily until late 2015.  . Drug Use: No     Comment: 03/30/2013 "[place]"  . Sexual Activity: Not on file   Other Topics Concern  . Not on file   Social History Narrative   Lives in [place] by himself.  Does not routinely exercise.     Review of Systems: General: negative for chills, fever, night sweats or weight changes.  Cardiovascular: negative for chest pain, dyspnea on exertion, edema, orthopnea, palpitations, paroxysmal nocturnal dyspnea or shortness of breath Dermatological:  negative for rash Respiratory: negative for cough or wheezing Urologic: negative for hematuria Abdominal: negative for nausea, vomiting, diarrhea, bright red blood per rectum, melena, or hematemesis Neurologic: negative for visual changes, syncope, or dizziness All other systems reviewed and are otherwise negative except as noted above.    Blood pressure 122/88, pulse 76, height 5\' 10"  (1.778 m), weight 214 lb 12.8 oz (97.433 kg).  General appearance: alert, cooperative and no distress Neck: no carotid bruit and no JVD Lungs: clear to auscultation bilaterally Heart: regular rate and rhythm, S1, S2 normal, no murmur, click, rub or gallop Extremities: no LEE Pulses: 2+ and symmetric Skin: warm and dry Neurologic: Grossly normal  EKG Not performed  ASSESSMENT AND PLAN:   1. Chronic Systolic CHF in the Setting of NICM: EF 20-25%. Lungs are CTAB and no LEE. At his last visit, his Cozaar was increased to 50 mg BID. He is also on 6.25 mg of Coreg and 12.5 mg of spironolactone as well as Demadex. His HR and BP are both stable and high enough to tolerate increase in his BB. Will increase Coreg to 12.5 mg BID. Continue current dose of losartan. Continue diuretics, daily weights and low sodium diet. F/u with Dr. Percival Spanish in 2 week for further med titration. He will undergo repeat 2D echo to reassess LVF in several weeks. If EF remains <35%, will need to be considered for ICD.  PLAN  F/U with MD in 2 weeks.   Jerome Adams, BRITTAINYPA-C 12/07/2014 2:36 PM

## 2014-12-07 ENCOUNTER — Ambulatory Visit (INDEPENDENT_AMBULATORY_CARE_PROVIDER_SITE_OTHER): Payer: Medicare Other | Admitting: Cardiology

## 2014-12-07 ENCOUNTER — Encounter: Payer: Self-pay | Admitting: Cardiology

## 2014-12-07 VITALS — BP 122/88 | HR 76 | Ht 70.0 in | Wt 214.8 lb

## 2014-12-07 DIAGNOSIS — I429 Cardiomyopathy, unspecified: Secondary | ICD-10-CM | POA: Diagnosis not present

## 2014-12-07 DIAGNOSIS — I428 Other cardiomyopathies: Secondary | ICD-10-CM

## 2014-12-07 MED ORDER — CARVEDILOL 12.5 MG PO TABS: 12.5000 mg | ORAL_TABLET | Freq: Two times a day (BID) | ORAL | Status: DC

## 2014-12-07 NOTE — Patient Instructions (Addendum)
Your physician recommends that you schedule a follow-up appointment in: 2 [place] CARVEDILOL TO 12.5 MG TWICE DAILY= 2 OF THE 6.25 MG TABLETS TWICE DAILY   Low-Sodium Eating Plan Sodium raises blood pressure and causes water to be held in the body. Getting less sodium from food will help lower your blood pressure, reduce any swelling, and protect your heart, liver, and kidneys. We get sodium by adding salt (sodium chloride) to food. Most of our sodium comes from canned, boxed, and frozen foods. Restaurant foods, fast foods, and pizza are also very high in sodium. Even if you take medicine to lower your blood pressure or to reduce fluid in your body, getting less sodium from your food is important. WHAT IS MY PLAN? Most people should limit their sodium intake to 2,300 mg a day. Your health care provider recommends that you limit your sodium intake to __________ a day.  WHAT DO I NEED TO KNOW ABOUT THIS EATING PLAN? For the low-sodium eating plan, you will follow these general guidelines:  Choose foods with a % Daily Value for sodium of less than 5% (as listed on the food label).   Use salt-free seasonings or herbs instead of table salt or sea salt.   Check with your health care provider or pharmacist before using salt substitutes.   Eat fresh foods.  Eat more vegetables and fruits.  Limit canned vegetables. If you do use them, rinse them well to decrease the sodium.   Limit cheese to 1 oz (28 g) per day.   Eat lower-sodium products, often labeled as "lower sodium" or "no salt added."  Avoid foods that contain monosodium glutamate (MSG). MSG is sometimes added to [place] food and some canned foods.  Check food labels (Nutrition Facts labels) on foods to learn how much sodium is in one serving.  Eat more home-cooked food and less restaurant, buffet, and fast food.  When eating at a restaurant, ask that your food be prepared with  less salt or none, if possible.  HOW DO I READ FOOD LABELS FOR SODIUM INFORMATION? The Nutrition Facts label lists the amount of sodium in one serving of the food. If you eat more than one serving, you must multiply the listed amount of sodium by the number of servings. Food labels may also identify foods as:  Sodium free--Less than 5 mg in a serving.  Very low sodium--35 mg or less in a serving.  Low sodium--140 mg or less in a serving.  Light in sodium--50% less sodium in a serving. For example, if a food that usually has 300 mg of sodium is changed to become light in sodium, it will have 150 mg of sodium.  Reduced sodium--25% less sodium in a serving. For example, if a food that usually has 400 mg of sodium is changed to reduced sodium, it will have 300 mg of sodium. WHAT FOODS CAN I EAT? Grains Low-sodium cereals, including oats, puffed wheat and rice, and shredded wheat cereals. Low-sodium crackers. Unsalted rice and pasta. Lower-sodium bread.  Vegetables Frozen or fresh vegetables. Low-sodium or reduced-sodium canned vegetables. Low-sodium or reduced-sodium tomato sauce and paste. Low-sodium or reduced-sodium tomato and vegetable juices.  Fruits Fresh, frozen, and canned fruit. Fruit juice.  Meat and Other Protein Products Low-sodium canned tuna and salmon. Fresh or frozen meat, poultry, seafood, and fish. Lamb. Unsalted nuts. Dried beans, peas, and lentils without added salt. Unsalted canned beans. Homemade soups without salt. Eggs.  Dairy  Milk. Soy milk. Ricotta cheese. Low-sodium or reduced-sodium cheeses. Yogurt.  Condiments Fresh and dried herbs and spices. Salt-free seasonings. Onion and garlic powders. Low-sodium varieties of mustard and ketchup. Lemon juice.  Fats and Oils Reduced-sodium salad dressings. Unsalted butter.  Other Unsalted popcorn and pretzels.  The items listed above may not be a complete list of recommended foods or beverages. Contact your  dietitian for more options. WHAT FOODS ARE NOT RECOMMENDED? Grains Instant hot cereals. Bread stuffing, pancake, and biscuit mixes. Croutons. Seasoned rice or pasta mixes. Noodle soup cups. Boxed or frozen macaroni and cheese. Self-rising flour. Regular salted crackers. Vegetables Regular canned vegetables. Regular canned tomato sauce and paste. Regular tomato and vegetable juices. Frozen vegetables in sauces. Salted french fries. Olives. Angie Fava. Relishes. Sauerkraut. Salsa. Meat and Other Protein Products Salted, canned, smoked, spiced, or pickled meats, seafood, or fish. Bacon, ham, sausage, hot dogs, corned beef, chipped beef, and packaged luncheon meats. Salt pork. Jerky. Pickled herring. Anchovies, regular canned tuna, and sardines. Salted nuts. Dairy Processed cheese and cheese spreads. Cheese curds. Blue cheese and cottage cheese. Buttermilk.  Condiments Onion and garlic salt, seasoned salt, table salt, and sea salt. Canned and packaged gravies. Worcestershire sauce. Tartar sauce. Barbecue sauce. Teriyaki sauce. Soy sauce, including reduced sodium. Steak sauce. Fish sauce. Oyster sauce. Cocktail sauce. Horseradish. Regular ketchup and mustard. Meat flavorings and tenderizers. Bouillon cubes. Hot sauce. Tabasco sauce. Marinades. Taco seasonings. Relishes. Fats and Oils Regular salad dressings. Salted butter. Margarine. Ghee. Bacon fat.  Other Potato and tortilla chips. Corn chips and puffs. Salted popcorn and pretzels. Canned or dried soups. Pizza. Frozen entrees and pot pies.  The items listed above may not be a complete list of foods and beverages to avoid. Contact your dietitian for more information. Document Released: 01/03/2002 Document Revised: 07/19/2013 Document Reviewed: 05/18/2013 [facility] Patient Information 2015 [place], [place]. This information is not intended to replace advice given to you by your health care provider. Make sure you discuss any questions you have with  your health care provider.

## 2014-12-14 ENCOUNTER — Ambulatory Visit: Payer: Medicare Other | Admitting: Cardiology

## 2014-12-18 ENCOUNTER — Telehealth: Payer: Self-pay | Admitting: Cardiology

## 2014-12-18 NOTE — Telephone Encounter (Signed)
Received records from [facility] for appointment with Dr Percival Spanish on 12/21/14.  Records given to [facility] (medical records) for Dr Hochrein's schedule on 12/21/14.

## 2014-12-19 ENCOUNTER — Ambulatory Visit (HOSPITAL_BASED_OUTPATIENT_CLINIC_OR_DEPARTMENT_OTHER): Payer: Medicare Other | Attending: Pulmonary Disease | Admitting: Radiology

## 2014-12-19 VITALS — Ht 70.0 in | Wt 205.0 lb

## 2014-12-19 DIAGNOSIS — R0683 Snoring: Secondary | ICD-10-CM | POA: Diagnosis not present

## 2014-12-19 DIAGNOSIS — I493 Ventricular premature depolarization: Secondary | ICD-10-CM | POA: Insufficient documentation

## 2014-12-19 DIAGNOSIS — G4730 Sleep apnea, unspecified: Secondary | ICD-10-CM | POA: Insufficient documentation

## 2014-12-19 DIAGNOSIS — G4710 Hypersomnia, unspecified: Secondary | ICD-10-CM | POA: Diagnosis present

## 2014-12-19 DIAGNOSIS — G4733 Obstructive sleep apnea (adult) (pediatric): Secondary | ICD-10-CM

## 2014-12-21 ENCOUNTER — Ambulatory Visit: Payer: Medicare Other | Admitting: Cardiology

## 2014-12-22 ENCOUNTER — Ambulatory Visit (INDEPENDENT_AMBULATORY_CARE_PROVIDER_SITE_OTHER): Payer: Medicare Other | Admitting: Cardiology

## 2014-12-22 ENCOUNTER — Encounter: Payer: Self-pay | Admitting: Cardiology

## 2014-12-22 VITALS — BP 112/76 | HR 64 | Ht 70.0 in | Wt 212.0 lb

## 2014-12-22 DIAGNOSIS — I429 Cardiomyopathy, unspecified: Secondary | ICD-10-CM

## 2014-12-22 DIAGNOSIS — I428 Other cardiomyopathies: Secondary | ICD-10-CM

## 2014-12-22 MED ORDER — CARVEDILOL 6.25 MG PO TABS: 6.2500 mg | ORAL_TABLET | Freq: Two times a day (BID) | ORAL | Status: DC

## 2014-12-22 NOTE — Progress Notes (Signed)
Impingement     Cardiology Office Note   Date:  12/22/2014   ID:  Jerome Adams, DOB Oct 27, 1960, MRN [id number]  PCP:  Antonietta Jewel, MD  Cardiologist:   Minus Breeding, MD   Chief Complaint  Patient presents with  . Cardiomyopathy    History of Present Illness: Jerome Adams is a 54 y.o. male who presents for follow up of dilated cardiomyopathy thought to be related probably to alcohol abuse.  He does report that he had "a few beers" with a friend this week.  He had nonobstructive disease on left heart catheterization in early January. He's been good about salt. He had an episode of dizziness last night while seated. His last for about 25 minutes. He didn't feel his heart skipping. He did pass out. He hasn't had this before or since. He doesn't describe orthostasis. He did not have an orthostatic blood pressure drop today.Jerome Adams He's not describing classic PND or orthopnea. He's not had any swelling. His weight is back down. He denies any chest pressure, neck or arm discomfort. He has no palpitations, presyncope or syncope.  At the last visit I doubled his Cozaar.  Past Medical History  Diagnosis Date  . PUD (peptic ulcer disease)   . Essential hypertension   . Allergy   . GERD (gastroesophageal reflux disease)     takes  otc  . Spinal cord stimulator status     has had for 1 yr--inserted by [facility]  . Sleep apnea     was tested here at [facility].Jerome KitchenMarland KitchenMarland KitchenNever heard anymore about it (03/30/2013)  . Arthritis     "joints" (03/30/2013)  . Chronic lower back pain   . Depression   . Hepatitis C 2010    a. never treated - undectable virus 12/2013.  Jerome Adams Chronic pancreatitis   . NICM (nonischemic cardiomyopathy)     a. 12/2007 Echo: EF 45-50%;  b. 07/2014 Echo: EF 20-25%, diff HK, mod dil LA, sev dil RA, mild TR, PASP 31 mmHg.  Jerome Adams Alcohol abuse   . Drug abuse     a. Remote drug abuse (cocaine). UDS 07/2014 neg for cocaine.  . Transaminitis   . Tobacco abuse   . Hyperlipidemia   .  Hypertriglyceridemia   . Chronic pain syndrome   . CKD (chronic kidney disease), stage II     Past Surgical History  Procedure Laterality Date  . Back surgery    . Wrist surgery Right 1980's    repair of tendons and nerves  . Foot surgery Right 2012?    "took piece off that was hurting me" (03/30/2013)  . Spinal cord stimulator implant  11/14/2012  . Anterior cervical decomp/discectomy fusion  04/05/2012    Procedure: ANTERIOR CERVICAL DECOMPRESSION/DISCECTOMY FUSION 1 LEVEL;  Surgeon: Elaina Hoops, MD;  Location: [facility];  Service: Neurosurgery;  Laterality: N/A;  Cervical five-six anterior cervical decompression and fusion  . Posterior lumbar fusion      "L4-5" (03/31/2103)  . Hardware removal      "took screw out of my back; it had damaged my nerve" (03/30/2013)  . Incision and drainage abscess Left     "lower jaw, tooth abscess" (03/30/2013)  . Left heart catheterization with coronary angiogram N/A 08/03/2014    Procedure: LEFT HEART CATHETERIZATION WITH CORONARY ANGIOGRAM;  Surgeon: Blane Ohara, MD;  Location[facility] CATH LAB;  Service: Cardiovascular;  Laterality: N/A;     Current Outpatient Prescriptions  Medication Sig Dispense Refill  . albuterol (PROVENTIL  HFA;VENTOLIN HFA) 108 (90 BASE) MCG/ACT inhaler Inhale 2-4 puffs into the lungs every 6 (six) hours as needed for wheezing or shortness of breath. 1 Inhaler 0  . aspirin EC 81 MG EC tablet Take 1 tablet (81 mg total) by mouth daily. 30 tablet 0  . carvedilol (COREG) 12.5 MG tablet Take 1 tablet (12.5 mg total) by mouth 2 (two) times daily with a meal. 60 tablet 12  . diazepam (VALIUM) 10 MG tablet Take 1 tablet by mouth daily as needed.    Jerome Adams gemfibrozil (LOPID) 600 MG tablet Take 1 tablet (600 mg total) by mouth 2 (two) times daily before a meal. 60 tablet 5  . losartan (COZAAR) 50 MG tablet Take 1 tablet (50 mg total) by mouth 2 (two) times daily. 180 tablet 3  . lovastatin (MEVACOR) 40 MG tablet Take 1 tablet (40 mg total) by  mouth daily. 30 tablet 2  . Multiple Vitamins-Minerals (MULTIVITAMIN PO) Take 1 tablet by mouth daily.    Jerome Adams omega-3 acid ethyl esters (LOVAZA) 1 G capsule Take 1 capsule (1 g total) by mouth 2 (two) times daily. 60 capsule 5  . omeprazole (PRILOSEC) 40 MG capsule Take 1 capsule (40 mg total) by mouth daily. 30 capsule 0  . oxycodone (ROXICODONE) 30 MG immediate release tablet Take 1 tablet by mouth 4 (four) times daily.    . sertraline (ZOLOFT) 100 MG tablet Take 100 mg by mouth daily.    Jerome Adams spironolactone (ALDACTONE) 25 MG tablet Take 0.5 tablets (12.5 mg total) by mouth daily. 30 tablet 3  . testosterone cypionate (DEPOTESTOTERONE CYPIONATE) 200 MG/ML injection Inject 200 mg into the muscle once a week. Once per week    . torsemide (DEMADEX) 20 MG tablet Take 2 tablets (40 mg total) by mouth daily. 120 tablet 5  . varenicline (CHANTIX CONTINUING MONTH PAK) 1 MG tablet Take 1 tablet (1 mg total) by mouth 2 (two) times daily. 60 tablet 2   No current facility-administered medications for this visit.    Allergies:   Hydrocodone-acetaminophen    ROS:  Please see the history of present illness.   Otherwise, review of systems are positive for back pain.   All other systems are reviewed and negative.    PHYSICAL EXAM: VS:  BP 112/76 mmHg  Pulse 64  Ht 5\' 10"  (1.778 m)  Wt 212 lb (96.163 kg)  BMI 30.42 kg/m2 , BMI Body mass index is 30.42 kg/(m^2). GENERAL:  Well appearing NECK:  No jugular venous distention, waveform within normal limits, carotid upstroke brisk and symmetric, no bruits, no thyromegaly LUNGS:  Clear to auscultation bilaterally BACK:  No CVA tenderness CHEST:  Unremarkable HEART:  PMI not displaced or sustained,S1 and S2 within normal limits, no S3, no S4, no clicks, no rubs, no murmurs ABD:  Flat, positive bowel sounds normal in frequency in pitch, no bruits, no rebound, no guarding, no midline pulsatile mass, no hepatomegaly, positive splenomegaly EXT:  2 plus pulses  throughout, no edema, no cyanosis no clubbing    EKG:  EKG is not ordered today.   Recent Labs: 07/30/2014: B Natriuretic Peptide 1191.1* 08/03/2014: Hemoglobin 18.4*; Platelets 168 08/10/2014: ALT 46 11/09/2014: BUN 10; Creatinine 1.02; Potassium 4.5; Sodium 139     Wt Readings from Last 3 Encounters:  12/22/14 212 lb (96.163 kg)  12/19/14 205 lb (92.987 kg)  12/07/14 214 lb 12.8 oz (97.433 kg)      Other studies Reviewed: Additional studies/ records that were reviewed today include:  None.   ASSESSMENT AND PLAN:  Chronic systolic CHF -     I don't see evidence of volume overload today. His blood pressure and borderline but I will try to increase his carvedilol to 18.75 mg twice a day..  Essential HTN - This is being managed in the context of treating his CHF.   CKD stage II -   His creatinine recently was 1.18. I reviewed these labs.  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   Alcohol abuse with recent transaminitis - I again reinforced importance of abstinence.    Tobacco abuse - He  Has the Chantix  But has not yet started it.  We discussed all of the potential side effects and in particular I reviewed the Black Box Warning  At the previous appointment.  He has no depression.  He will start this.  (Although he has told me this before.)   Current medicines are reviewed at length with the patient today.  The patient does not have concerns regarding medicines.  The following changes have been made:  Med changes as above  Labs/ tests ordered today include: None    Disposition:   FU with me or APP in 6 weeks.    Signed, Minus Breeding, MD  12/22/2014 9:35 AM    [place]

## 2014-12-22 NOTE — Patient Instructions (Signed)
Your physician recommends that you schedule a follow-up appointment in: 6 weeks with Dr. Percival Spanish  Increase your coreg to 18.75 two times a day     Take a 12.5 with a 6.25 to = 18.75 mg

## 2014-12-28 DIAGNOSIS — G4733 Obstructive sleep apnea (adult) (pediatric): Secondary | ICD-10-CM

## 2014-12-29 ENCOUNTER — Telehealth: Payer: Self-pay | Admitting: Pulmonary Disease

## 2014-12-29 NOTE — Sleep Study (Signed)
   NAME: Jerome Adams DATE OF BIRTH:  February 27, 1961 MEDICAL RECORD NUMBER [id number]  LOCATION: [facility]  PHYSICIAN: Talent OF STUDY: 12/19/2014  SLEEP STUDY TYPE: Nocturnal Polysomnogram               REFERRING PHYSICIAN: Sheranda Seabrooks, Armando Reichert, MD  INDICATION FOR STUDY: Hypersomnia with sleep apnea  EPWORTH SLEEPINESS SCORE:  8 HEIGHT: 5\' 10"  (177.8 cm)  WEIGHT: 205 lb (92.987 kg)    Body mass index is 29.41 kg/(m^2).  NECK SIZE: 17 in.  MEDICATIONS: Reviewed in the sleep record  SLEEP ARCHITECTURE: The patient had a total sleep time of 273 minutes with no slow-wave sleep and only 15 minutes of REM. Sleep onset latency was prolonged at 41 minutes, and REM onset was prolonged at 170 minutes. Sleep efficiency was moderately reduced at 72%.  RESPIRATORY DATA: The patient was found to have no apneas and only 3 obstructive hypopneas, giving him an AHI of only 0.7 events per hour. The events occurred primarily in the supine position, and there was moderate snoring noted throughout.  OXYGEN DATA: There was transient oxygen desaturation as low as 90% with the patient's obstructive events  CARDIAC DATA: Rare PVC noted  MOVEMENT/PARASOMNIA: The patient had 178 periodic limb movements, with 13 per hour resulting in arousal or awakening.  IMPRESSION/ RECOMMENDATION:    1) small numbers of obstructive events which do not meet the AHI criteria for the obstructive sleep apnea syndrome. He was noted to have moderate snoring. Would recommend the patient work on modest weight loss, and also avoiding supine position during sleep.  2) rare PVC noted, but no clinically significant arrhythmias were seen  3) the patient had large numbers of periodic limb movements with what appears to be significant sleep disruption. Clinical correlation is suggested, to see if the patient's history suggest a movement disorder of sleep.     [place], American Board  of Sleep Medicine  ELECTRONICALLY SIGNED ON:  12/29/2014, 10:34 AM [place] PH: [phone]   FX: [phone] [place]

## 2014-12-29 NOTE — Telephone Encounter (Signed)
Called pt and appt scheduled for 6/9 at 11

## 2014-12-29 NOTE — Telephone Encounter (Signed)
Pt needs ov to review sleep study results. ?next week.  

## 2015-01-04 ENCOUNTER — Ambulatory Visit (INDEPENDENT_AMBULATORY_CARE_PROVIDER_SITE_OTHER): Payer: Medicare Other | Admitting: Pulmonary Disease

## 2015-01-04 ENCOUNTER — Encounter: Payer: Self-pay | Admitting: Pulmonary Disease

## 2015-01-04 VITALS — BP 134/78 | HR 58 | Temp 97.0°F | Ht 70.0 in | Wt 215.6 lb

## 2015-01-04 DIAGNOSIS — R0683 Snoring: Secondary | ICD-10-CM

## 2015-01-04 DIAGNOSIS — G2581 Restless legs syndrome: Secondary | ICD-10-CM

## 2015-01-04 MED ORDER — REQUIP 0.5 MG PO TABS: ORAL_TABLET | ORAL | Status: DC

## 2015-01-04 NOTE — Progress Notes (Signed)
   Subjective:    Patient ID: Jerome Adams, male    DOB: 02-24-1961, 54 y.o.   MRN: [id number]  HPI The patient comes in today for follow-up of his recent sleep study. He did not have sleep apnea by his study, but did have large numbers of periodic limb movements with significant arousal or awakening. I have reviewed the study with him in detail, and answered all of his questions.   Review of Systems  Constitutional: Negative for fever and unexpected weight change.  HENT: Negative for congestion, dental problem, ear pain, nosebleeds, postnasal drip, rhinorrhea, sinus pressure, sneezing, sore throat and trouble swallowing.   Eyes: Negative for redness and itching.  Respiratory: Negative for cough, chest tightness, shortness of breath and wheezing.   Cardiovascular: Negative for palpitations and leg swelling.  Gastrointestinal: Negative for nausea and vomiting.  Genitourinary: Negative for dysuria.  Musculoskeletal: Negative for joint swelling.  Skin: Negative for rash.  Neurological: Negative for headaches.  Hematological: Does not bruise/bleed easily.  Psychiatric/Behavioral: Negative for dysphoric mood. The patient is not nervous/anxious.        Objective:   Physical Exam Well-developed male in no acute distress Nose without purulence or discharge noted Neck without lymphadenopathy or thyromegaly Lower extremities with mild edema, no cyanosis Alert and oriented, moves all 4 extremities.       Assessment & Plan:

## 2015-01-04 NOTE — Assessment & Plan Note (Signed)
The patient has a history of the restless leg syndrome, and never took his dopamine agonist because he felt it was secondary to "nerves" related to stopping alcohol. However, his most recent sleep study still shows significant limb movements with sleep disruption. The patient feels he is not sleeping well at night, and it is obviously not from sleep apnea. I will therefore try him on a dopamine agonist, and asked him to use consistently for 4 weeks before making a decision.

## 2015-01-04 NOTE — Patient Instructions (Signed)
Will try on requip 0.5 mg each night after dinner.  Start with one each night, then increase to 2 after one week.  Evaluate whether the new medication has helped your sleep and leg kicking.  Please report back to [place] after 4 weeks to let us know if it helped or not Try sleeping on more pillows upright to see if sleep improves.  followup with Dr. Halford Chessman in one year if you stay on medication for your leg kicks.

## 2015-01-04 NOTE — Assessment & Plan Note (Signed)
The patient does not have sleep apnea by his recent study, and therefore I suspect his breathing issues at night are related to his underlying heart disease and orthopnea. I've asked him to try and sleep more upright on a few pillows to see if this helps.

## 2015-02-01 ENCOUNTER — Ambulatory Visit: Payer: Medicare Other | Admitting: Cardiology

## 2015-02-12 ENCOUNTER — Other Ambulatory Visit: Payer: Self-pay | Admitting: *Deleted

## 2015-02-12 MED ORDER — REQUIP 0.5 MG PO TABS: ORAL_TABLET | ORAL | Status: DC

## 2015-02-16 ENCOUNTER — Ambulatory Visit (INDEPENDENT_AMBULATORY_CARE_PROVIDER_SITE_OTHER): Payer: Medicare Other | Admitting: Cardiology

## 2015-02-16 ENCOUNTER — Encounter: Payer: Self-pay | Admitting: Cardiology

## 2015-02-16 VITALS — BP 108/76 | HR 59 | Ht 70.0 in | Wt 211.0 lb

## 2015-02-16 DIAGNOSIS — I426 Alcoholic cardiomyopathy: Secondary | ICD-10-CM

## 2015-02-16 NOTE — Patient Instructions (Signed)
Your physician wants you to follow-up in: 6 Months with APP. You will receive a reminder letter in the mail two months in advance. If you don't receive a letter, please call our office to schedule the follow-up appointment.

## 2015-02-16 NOTE — Progress Notes (Signed)
Impingement     Cardiology Office Note   Date:  02/16/2015   ID:  Jerome Adams, DOB 1961/02/27, MRN [id number]  PCP:  Antonietta Jewel, MD  Cardiologist:   Minus Breeding, MD   Chief Complaint  Patient presents with  . [place] UP    Patient feels SOB and has chest pain/pressure at times.    History of Present Illness: Jerome Adams is a 54 y.o. male who presents for follow up of dilated cardiomyopathy thought to be related probably to alcohol abuse.  He still drinks on occasion.   He had nonobstructive disease on left heart catheterization in early January. At the last visit I titrated his Coreg to 18.75 mg twice a day. He tolerated this. He's had no acute symptoms. He denies any palpitations, presyncope or syncope. He's had no weight gain or edema.   He's had no new shortness of breath, PND or orthopnea.  Past Medical History  Diagnosis Date  . PUD (peptic ulcer disease)   . Essential hypertension   . Allergy   . GERD (gastroesophageal reflux disease)     takes  otc  . Spinal cord stimulator status     has had for 1 yr--inserted by [facility]  . Sleep apnea     was tested here at [facility].Marland KitchenMarland KitchenMarland KitchenNever heard anymore about it (03/30/2013)  . Arthritis     "joints" (03/30/2013)  . Chronic lower back pain   . Depression   . Hepatitis C 2010    a. never treated - undectable virus 12/2013.  [name] Chronic pancreatitis   . NICM (nonischemic cardiomyopathy)     a. 12/2007 Echo: EF 45-50%;  b. 07/2014 Echo: EF 20-25%, diff HK, mod dil LA, sev dil RA, mild TR, PASP 31 mmHg.  [name] Alcohol abuse   . Drug abuse     a. Remote drug abuse (cocaine). UDS 07/2014 neg for cocaine.  . Transaminitis   . Tobacco abuse   . Hyperlipidemia   . Hypertriglyceridemia   . Chronic pain syndrome   . CKD (chronic kidney disease), stage II     Past Surgical History  Procedure Laterality Date  . Back surgery    . Wrist surgery Right 1980's    repair of tendons and nerves  . Foot surgery Right 2012?   "took piece off that was hurting me" (03/30/2013)  . Spinal cord stimulator implant  11/14/2012  . Anterior cervical decomp/discectomy fusion  04/05/2012    Procedure: ANTERIOR CERVICAL DECOMPRESSION/DISCECTOMY FUSION 1 LEVEL;  Surgeon: Elaina Hoops, MD;  Location: [place] [facility];  Service: Neurosurgery;  Laterality: N/A;  Cervical five-six anterior cervical decompression and fusion  . Posterior lumbar fusion      "L4-5" (03/31/2103)  . Hardware removal      "took screw out of my back; it had damaged my nerve" (03/30/2013)  . Incision and drainage abscess Left     "lower jaw, tooth abscess" (03/30/2013)  . Left heart catheterization with coronary angiogram N/A 08/03/2014    Procedure: LEFT HEART CATHETERIZATION WITH CORONARY ANGIOGRAM;  Surgeon: Blane Ohara, MD;  Location: [facility] CATH LAB;  Service: Cardiovascular;  Laterality: N/A;     Current Outpatient Prescriptions  Medication Sig Dispense Refill  . albuterol (PROVENTIL HFA;VENTOLIN HFA) 108 (90 BASE) MCG/ACT inhaler Inhale 2-4 puffs into the lungs every 6 (six) hours as needed for wheezing or shortness of breath. 1 Inhaler 0  . aspirin EC 81 MG EC tablet Take 1 tablet (81 mg  total) by mouth daily. 30 tablet 0  . carvedilol (COREG) 12.5 MG tablet Take 1 tablet (12.5 mg total) by mouth 2 (two) times daily with a meal. 60 tablet 12  . carvedilol (COREG) 6.25 MG tablet Take 1 tablet (6.25 mg total) by mouth 2 (two) times daily. 180 tablet 3  . gemfibrozil (LOPID) 600 MG tablet Take 1 tablet (600 mg total) by mouth 2 (two) times daily before a meal. 60 tablet 5  . losartan (COZAAR) 50 MG tablet Take 1 tablet (50 mg total) by mouth 2 (two) times daily. 180 tablet 3  . lovastatin (MEVACOR) 40 MG tablet Take 1 tablet (40 mg total) by mouth daily. 30 tablet 2  . Multiple Vitamins-Minerals (MULTIVITAMIN PO) Take 1 tablet by mouth daily.    [name] omega-3 acid ethyl esters (LOVAZA) 1 G capsule Take 1 capsule (1 g total) by mouth 2 (two) times daily. 60 capsule 5  .  omeprazole (PRILOSEC) 40 MG capsule Take 1 capsule (40 mg total) by mouth daily. 30 capsule 0  . oxycodone (ROXICODONE) 30 MG immediate release tablet Take 1 tablet by mouth 4 (four) times daily.    . REQUIP 0.5 MG tablet 2 each night after dinner 60 tablet 5  . sertraline (ZOLOFT) 100 MG tablet Take 100 mg by mouth daily.    [name] spironolactone (ALDACTONE) 25 MG tablet Take 0.5 tablets (12.5 mg total) by mouth daily. 30 tablet 3  . testosterone cypionate (DEPOTESTOTERONE CYPIONATE) 200 MG/ML injection Inject 200 mg into the muscle once a week. Once per week    . torsemide (DEMADEX) 20 MG tablet Take 2 tablets (40 mg total) by mouth daily. 120 tablet 5   No current facility-administered medications for this visit.    Allergies:   Hydrocodone-acetaminophen    ROS:  Please see the history of present illness.   Otherwise, review of systems are positive for back pain.   All other systems are reviewed and negative.    PHYSICAL EXAM: VS:  BP 108/76 mmHg  Pulse 59  Ht 5\' 10"  (1.778 m)  Wt 211 lb (95.709 kg)  BMI 30.28 kg/m2 , BMI Body mass index is 30.28 kg/(m^2). GENERAL:  Well appearing NECK:  No jugular venous distention, waveform within normal limits, carotid upstroke brisk and symmetric, no bruits, no thyromegaly LUNGS:  Clear to auscultation bilaterally BACK:  No CVA tenderness CHEST:  Unremarkable HEART:  PMI not displaced or sustained,S1 and S2 within normal limits, no S3, no S4, no clicks, no rubs, no murmurs ABD:  Flat, positive bowel sounds normal in frequency in pitch, no bruits, no rebound, no guarding, no midline pulsatile mass, no hepatomegaly, positive splenomegaly EXT:  2 plus pulses throughout, no edema, no cyanosis no clubbing    EKG:  EKG is not ordered today.   Recent Labs: 07/30/2014: B Natriuretic Peptide 1191.1* 08/03/2014: Hemoglobin 18.4*; Platelets 168 08/10/2014: ALT 46 11/09/2014: BUN 10; Creat 1.02; Potassium 4.5; Sodium 139     Wt Readings from Last 3  Encounters:  02/16/15 211 lb (95.709 kg)  01/04/15 215 lb 9.6 oz (97.796 kg)  12/22/14 212 lb (96.163 kg)      Other studies Reviewed: Additional studies/ records that were reviewed today include:  None.   ASSESSMENT AND PLAN:  Chronic systolic CHF -      He seems to be euvolemic. His heart rate allow med titration at this point. We'll bring him back in about 6 months and if his meds cannot be adjusted further  he can have a follow-up echo to see what his new baseline EF is. He needs to completely abstain from alcohol.  Essential HTN - This is being managed in the context of treating his CHF.   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   Alcohol abuse with recent transaminitis - I again reinforced importance of abstinence.    Tobacco abuse - He has the Chantix.  I don't know is using this.   Current medicines are reviewed at length with the patient today.  The patient does not have concerns regarding medicines.  The following changes have been made:  None  Labs/ tests ordered today include: None    Disposition:   FU with APP in six months.    Signed, Minus Breeding, MD  02/16/2015 3:19 PM    [facility]

## 2015-03-02 ENCOUNTER — Telehealth: Payer: Self-pay | Admitting: Cardiology

## 2015-03-02 NOTE — Telephone Encounter (Signed)
Spoke with patient regarding refill request. Informed him our records show he takes lovastatin 40mg  ONE tablet daily - not 1.5 tablets.. Also informed him Dr. Percival Spanish has not changed the dose of this medication nor has he refilled this medication. Last refill documented in EPIC is from 2014 by a different provider. Informed patient his PCP who is not on EPIC may have Rx'ed this medication for him. Asked him to check his pill bottle for the prescriber info but he does not have the bottle.   Called [facility] (his previous pharmacy) and the lovastatin 40mg  take 1.56m tabs QHS was last refilled 6/28 with no refills and was out of date - prescribing MD Dr. Blanch Media.  [facility] and this medication has not yet been filled at their location.   Called patient and informed him he needs to contact PCP office for refill. He states he is out of the medication but will call them on Monday - PCP office is closed today.   Patient's med list updated to reflect current dose of lovastatin

## 2015-03-02 NOTE — Telephone Encounter (Signed)
°  1. Which medications need to be refilled? Lovastatin( 1 1/2 tab a day)  2. Which pharmacy is medication to be sent to?[facility] on [address]  3. Do they need a 30 day or 90 day supply? 90  4. Would they like a call back once the medication has been sent to the pharmacy? Yes

## 2015-04-09 ENCOUNTER — Telehealth: Payer: Self-pay | Admitting: *Deleted

## 2015-04-20 ENCOUNTER — Other Ambulatory Visit: Payer: Self-pay | Admitting: Neurosurgery

## 2015-04-20 DIAGNOSIS — M5137 Other intervertebral disc degeneration, lumbosacral region: Secondary | ICD-10-CM

## 2015-04-24 ENCOUNTER — Telehealth: Payer: Self-pay | Admitting: Cardiology

## 2015-04-24 NOTE — Telephone Encounter (Signed)
HF program RN calling for most recent EF, relevant diagnoses. Information given and acknowledged.

## 2015-04-27 ENCOUNTER — Telehealth: Payer: Self-pay | Admitting: *Deleted

## 2015-04-27 NOTE — Telephone Encounter (Signed)
Clearance for dental procedure was faxed to Medical clearance for Dental services

## 2015-05-03 ENCOUNTER — Telehealth: Payer: Self-pay | Admitting: Cardiology

## 2015-05-03 NOTE — Telephone Encounter (Signed)
Lelon Frohlich informs me they received dental clearance fax from our office but the questionnaire they provided was not complete. She is faxing back for Dr. Rosezella [place] review.

## 2015-05-03 NOTE — Telephone Encounter (Signed)
Complete form was faxed back to Dental office

## 2015-05-04 ENCOUNTER — Ambulatory Visit
Admission: RE | Admit: 2015-05-04 | Discharge: 2015-05-04 | Disposition: A | Discharge status: Home / Self Care | Payer: Medicare Other | Source: Ambulatory Visit | Attending: Neurosurgery | Admitting: Neurosurgery

## 2015-05-04 VITALS — BP 123/74 | HR 63

## 2015-05-04 DIAGNOSIS — M5137 Other intervertebral disc degeneration, lumbosacral region: Secondary | ICD-10-CM

## 2015-05-04 DIAGNOSIS — G8929 Other chronic pain: Secondary | ICD-10-CM

## 2015-05-04 DIAGNOSIS — G894 Chronic pain syndrome: Secondary | ICD-10-CM

## 2015-05-04 DIAGNOSIS — M545 Low back pain, unspecified: Secondary | ICD-10-CM

## 2015-05-04 DIAGNOSIS — M5106 Intervertebral disc disorders with myelopathy, lumbar region: Secondary | ICD-10-CM

## 2015-05-04 MED ORDER — MEPERIDINE HCL 100 MG/ML IJ SOLN
75.0000 mg | Freq: Once | INTRAMUSCULAR | Status: AC
  Administered 2015-05-04: 75 mg via INTRAMUSCULAR

## 2015-05-04 MED ORDER — DIAZEPAM 5 MG PO TABS
10.0000 mg | ORAL_TABLET | Freq: Once | ORAL | Status: AC
  Administered 2015-05-04: 5 mg via ORAL

## 2015-05-04 MED ORDER — ONDANSETRON HCL 4 MG/2ML IJ SOLN
4.0000 mg | Freq: Once | INTRAMUSCULAR | Status: AC
  Administered 2015-05-04: 4 mg via INTRAMUSCULAR

## 2015-05-04 MED ORDER — IOHEXOL 180 MG/ML  SOLN
17.0000 mL | Freq: Once | INTRAMUSCULAR | Status: DC | PRN
  Administered 2015-05-04: 17 mL via INTRATHECAL

## 2015-05-04 NOTE — Discharge Instructions (Signed)
Myelogram Discharge Instructions  1. Go home and rest quietly for the next 24 hours.  It is important to lie flat for the next 24 hours.  Get up only to go to the restroom.  You may lie in the bed or on a couch on your back, your stomach, your left side or your right side.  You may have one pillow under your head.  You may have pillows between your knees while you are on your side or under your knees while you are on your back.  2. DO NOT drive today.  Recline the seat as far back as it will go, while still wearing your seat belt, on the way home.  3. You may get up to go to the bathroom as needed.  You may sit up for 10 minutes to eat.  You may resume your normal diet and medications unless otherwise indicated.  Drink lots of extra fluids today and tomorrow.  4. The incidence of headache, nausea, or vomiting is about 5% (one in 20 patients).  If you develop a headache, lie flat and drink plenty of fluids until the headache goes away.  Caffeinated beverages may be helpful.  If you develop severe nausea and vomiting or a headache that does not go away with flat bed rest, call [phone].  5. You may resume normal activities after your 24 hours of bed rest is over; however, do not exert yourself strongly or do any heavy lifting tomorrow. If when you get up you have a headache when standing, go back to bed and force fluids for another 24 hours.  6. Call your physician for a follow-up appointment.  The results of your myelogram will be sent directly to your physician by the following day.  7. If you have any questions or if complications develop after you arrive home, please call [phone].  Discharge instructions have been explained to the patient.  The patient, or the person responsible for the patient, fully understands these instructions.      May resume Sertraline on Oct. 8, 2016, after 1:00 pm.

## 2015-05-04 NOTE — Progress Notes (Signed)
Patient states he has been off Sertraline for at least the past two days. 

## 2015-06-08 ENCOUNTER — Other Ambulatory Visit: Payer: Self-pay | Admitting: Cardiology

## 2015-06-11 ENCOUNTER — Other Ambulatory Visit: Payer: Self-pay | Admitting: Cardiology

## 2015-08-09 ENCOUNTER — Telehealth: Payer: Self-pay | Admitting: Cardiology

## 2015-08-09 NOTE — Telephone Encounter (Signed)
No need , patient going to ED

## 2015-11-20 ENCOUNTER — Other Ambulatory Visit: Payer: Self-pay | Admitting: Cardiology

## 2015-11-20 NOTE — Telephone Encounter (Signed)
Rx(s) sent to pharmacy electronically.  

## 2015-12-27 ENCOUNTER — Other Ambulatory Visit: Payer: Self-pay | Admitting: Cardiology

## 2015-12-27 NOTE — Telephone Encounter (Signed)
LMTCB on v/m advising patient to call back to verify correct dose of Coreg

## 2016-01-01 ENCOUNTER — Other Ambulatory Visit: Payer: Self-pay

## 2016-01-01 ENCOUNTER — Ambulatory Visit: Payer: Medicare Other | Admitting: Cardiology

## 2016-01-01 MED ORDER — SPIRONOLACTONE 25 MG PO TABS: 12.5000 mg | ORAL_TABLET | Freq: Every day | ORAL | Status: DC

## 2016-01-02 NOTE — Progress Notes (Signed)
Impingement     Cardiology Office Note   Date:  01/03/2016   ID:  Jerome Adams, DOB Mar 18, 1961, MRN [id number]  PCP:  Antonietta Jewel, MD  Cardiologist:   Minus Breeding, MD   Chief Complaint  Patient presents with  . Shortness of Breath    History of Present Illness: Jerome Adams is a 55 y.o. male who presents for follow up of dilated cardiomyopathy thought to be related probably to alcohol abuse. He reports that he is not drinking.  However, he is also not taking his meds as he is supposed to.  He has only been taking the Coreg and the Cozaar once daily.  He is not watching his salt as he eats out frequently.  He reports that he is getting more SOB. He has dyspnea with exertion. His weight is up about 14 pounds since his last visit. He's not describing PND or orthopnea. He does have some increased abdominal distention similar to his previous episodes. He's not having any lower extremity swelling. He's not having any chest pressure, neck or arm discomfort. He's having no palpitations, presyncope or syncope.  Past Medical History  Diagnosis Date  . PUD (peptic ulcer disease)   . Essential hypertension   . Allergy   . GERD (gastroesophageal reflux disease)     takes  otc  . Spinal cord stimulator status     has had for 1 yr--inserted by [facility]  . Sleep apnea     was tested here at [facility].Marland KitchenMarland KitchenMarland KitchenNever heard anymore about it (03/30/2013)  . Arthritis     "joints" (03/30/2013)  . Chronic lower back pain   . Depression   . Hepatitis C 2010    a. never treated - undectable virus 12/2013.  [name] Chronic pancreatitis ([place])   . NICM (nonischemic cardiomyopathy) ([place])     a. 12/2007 Echo: EF 45-50%;  b. 07/2014 Echo: EF 20-25%, diff HK, mod dil LA, sev dil RA, mild TR, PASP 31 mmHg.  [name] Alcohol abuse   . Drug abuse     a. Remote drug abuse (cocaine). UDS 07/2014 neg for cocaine.  . Transaminitis   . Tobacco abuse   . Hyperlipidemia   . Hypertriglyceridemia   . Chronic pain syndrome   . CKD  (chronic kidney disease), stage II     Past Surgical History  Procedure Laterality Date  . Back surgery    . Wrist surgery Right 1980's    repair of tendons and nerves  . Foot surgery Right 2012?    "took piece off that was hurting me" (03/30/2013)  . Spinal cord stimulator implant  11/14/2012  . Anterior cervical decomp/discectomy fusion  04/05/2012    Procedure: ANTERIOR CERVICAL DECOMPRESSION/DISCECTOMY FUSION 1 LEVEL;  Surgeon: Elaina Hoops, MD;  Location: [place] [facility];  Service: Neurosurgery;  Laterality: N/A;  Cervical five-six anterior cervical decompression and fusion  . Posterior lumbar fusion      "L4-5" (03/31/2103)  . Hardware removal      "took screw out of my back; it had damaged my nerve" (03/30/2013)  . Incision and drainage abscess Left     "lower jaw, tooth abscess" (03/30/2013)  . Left heart catheterization with coronary angiogram N/A 08/03/2014    Procedure: LEFT HEART CATHETERIZATION WITH CORONARY ANGIOGRAM;  Surgeon: Blane Ohara, MD;  Location: [facility] CATH LAB;  Service: Cardiovascular;  Laterality: N/A;     Current Outpatient Prescriptions  Medication Sig Dispense Refill  . albuterol (PROVENTIL HFA;VENTOLIN HFA) 108 (90  BASE) MCG/ACT inhaler Inhale 2-4 puffs into the lungs every 6 (six) hours as needed for wheezing or shortness of breath. 1 Inhaler 0  . aspirin EC 81 MG EC tablet Take 1 tablet (81 mg total) by mouth daily. 30 tablet 0  . gemfibrozil (LOPID) 600 MG tablet Take 1 tablet (600 mg total) by mouth 2 (two) times daily before a meal. 60 tablet 5  . losartan (COZAAR) 100 MG tablet Take 1 tablet (100 mg total) by mouth daily. 30 tablet 11  . lovastatin (MEVACOR) 40 MG tablet Take 60 mg by mouth at bedtime.    . Multiple Vitamins-Minerals (MULTIVITAMIN PO) Take 1 tablet by mouth daily.    [name] omega-3 acid ethyl esters (LOVAZA) 1 G capsule Take 1 capsule (1 g total) by mouth 2 (two) times daily. 60 capsule 5  . omeprazole (PRILOSEC) 40 MG capsule Take 1 capsule (40 mg  total) by mouth daily. 30 capsule 0  . oxycodone (ROXICODONE) 30 MG immediate release tablet Take 1 tablet by mouth 4 (four) times daily.    . REQUIP 0.5 MG tablet 2 each night after dinner 60 tablet 5  . sertraline (ZOLOFT) 100 MG tablet Take 100 mg by mouth daily.    [name] spironolactone (ALDACTONE) 25 MG tablet Take 0.5 tablets (12.5 mg total) by mouth daily. 15 tablet 0  . testosterone cypionate (DEPOTESTOTERONE CYPIONATE) 200 MG/ML injection Inject 200 mg into the muscle once a week. Once per week    . torsemide (DEMADEX) 20 MG tablet Take 2 tablets (40 mg total) by mouth 2 (two) times daily. 120 tablet 11  . metolazone (ZAROXOLYN) 2.5 MG tablet Take 1 tablet (2.5 mg total) by mouth daily. 6 tablet 0  . metoprolol succinate (TOPROL-XL) 100 MG 24 hr tablet Take 1 tablet (100 mg total) by mouth daily. Take with or immediately following a meal. 30 tablet 11   No current facility-administered medications for this visit.    Allergies:   Hydrocodone-acetaminophen    ROS:  Please see the history of present illness.   Otherwise, review of systems are positive for back pain.   All other systems are reviewed and negative.    PHYSICAL EXAM: VS:  BP 124/80 mmHg  Pulse 73  Ht 5' 9.5" (1.765 m)  Wt 225 lb 12.8 oz (102.422 kg)  BMI 32.88 kg/m2 , BMI Body mass index is 32.88 kg/(m^2). GENERAL:  Well appearing NECK:  6 cm jugular venous distention, waveform within normal limits, carotid upstroke brisk and symmetric, no bruits, no thyromegaly LUNGS:  Clear to auscultation bilaterally BACK:  No CVA tenderness CHEST:  Unremarkable HEART:  PMI not displaced or sustained,S1 and S2 within normal limits, no S3, no S4, no clicks, no rubs, no murmurs ABD:  Mild abdominal distention, positive bowel sounds normal in frequency in pitch, no bruits, no rebound, no guarding, no midline pulsatile mass, no hepatomegaly, positive splenomegaly,  EXT:  2 plus pulses throughout, no edema, no cyanosis no  clubbing    EKG:  EKG is ordered today. Normal sinus rhythm, rate 73, axis rightward, poor anterior R wave progression, inferolateral T wave inversion unchanged from previous.   Recent Labs: No results found for requested labs within last 365 days.     Wt Readings from Last 3 Encounters:  01/03/16 225 lb 12.8 oz (102.422 kg)  02/16/15 211 lb (95.709 kg)  01/04/15 215 lb 9.6 oz (97.796 kg)      Other studies Reviewed: Additional studies/ records that were reviewed  today include:  None.   ASSESSMENT AND PLAN:  Chronic systolic CHF -     He has increased volume and part of this is secondary to medical noncompliance and probably dietary as well.  I'm going to increase his torsemide 40 mg twice daily. Also did have Zaroxolyn 2.5 mg daily. I will check a basic metabolic profile today. We will need to repeat a basic metabolic profile again early next week. He needs to be seen in the office early next week.  Essential HTN - This is being managed in the context of treating his CHF.   CKD stage II -   He has his labs followed closely by [facility], MD.  We will follow this closely as well as above.  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   Alcohol abuse with recent transaminitis - I again reinforced importance of abstinence.    Tobacco abuse - He has the Chantix.  He has been unable to quit smoking.    Current medicines are reviewed at length with the patient today.  The patient does not have concerns regarding medicines.  The following changes have been made:  As above.   Labs/ tests ordered today include:   BMET  Disposition:   FU with APP early next week. Ronnell Guadalajara, MD  01/03/2016 5:05 PM    [place]

## 2016-01-03 ENCOUNTER — Ambulatory Visit (INDEPENDENT_AMBULATORY_CARE_PROVIDER_SITE_OTHER): Payer: Medicare Other | Admitting: Cardiology

## 2016-01-03 ENCOUNTER — Encounter: Payer: Self-pay | Admitting: Cardiology

## 2016-01-03 ENCOUNTER — Ambulatory Visit: Payer: Medicare Other | Admitting: Cardiology

## 2016-01-03 VITALS — BP 124/80 | HR 73 | Ht 69.5 in | Wt 225.8 lb

## 2016-01-03 DIAGNOSIS — I420 Dilated cardiomyopathy: Secondary | ICD-10-CM | POA: Diagnosis not present

## 2016-01-03 DIAGNOSIS — I5023 Acute on chronic systolic (congestive) heart failure: Secondary | ICD-10-CM | POA: Diagnosis not present

## 2016-01-03 DIAGNOSIS — I10 Essential (primary) hypertension: Secondary | ICD-10-CM | POA: Diagnosis not present

## 2016-01-03 DIAGNOSIS — Z79899 Other long term (current) drug therapy: Secondary | ICD-10-CM | POA: Diagnosis not present

## 2016-01-03 MED ORDER — TORSEMIDE 20 MG PO TABS: 40.0000 mg | ORAL_TABLET | Freq: Two times a day (BID) | ORAL | Status: DC

## 2016-01-03 MED ORDER — METOPROLOL SUCCINATE ER 100 MG PO TB24: 100.0000 mg | ORAL_TABLET | Freq: Every day | ORAL | Status: DC

## 2016-01-03 MED ORDER — METOLAZONE 2.5 MG PO TABS: 2.5000 mg | ORAL_TABLET | Freq: Every day | ORAL | Status: DC

## 2016-01-03 MED ORDER — LOSARTAN POTASSIUM 100 MG PO TABS: 100.0000 mg | ORAL_TABLET | Freq: Every day | ORAL | Status: DC

## 2016-01-03 NOTE — Patient Instructions (Signed)
Medication Instructions:  STOP Carvedilol and START Toprol XL 100 mg daily, INCREASE Losartan 100 mg daily and Torsemide 2 tablets twice a day, TAKE Metolazone 2.5 mg with torsemide for 3 days  Labwork: BMP today  Testing/Procedures: NONE  Follow-Up: Your physician recommends that you schedule a follow-up appointment in: APP next Monday or Tuesday   Any Other Special Instructions Will Be Listed Below (If Applicable).  If you need a refill on your cardiac medications before your next appointment, please call your pharmacy.

## 2016-01-07 ENCOUNTER — Other Ambulatory Visit: Payer: Self-pay | Admitting: Cardiology

## 2016-01-07 MED ORDER — SPIRONOLACTONE 25 MG PO TABS: 12.5000 mg | ORAL_TABLET | Freq: Every day | ORAL | Status: DC

## 2016-01-07 NOTE — Telephone Encounter (Signed)
Rx(s) sent to pharmacy electronically.  

## 2016-01-08 ENCOUNTER — Ambulatory Visit: Payer: Medicare Other | Admitting: Cardiology

## 2016-01-09 ENCOUNTER — Encounter: Payer: Self-pay | Admitting: Cardiology

## 2016-03-27 ENCOUNTER — Encounter: Payer: Self-pay | Admitting: Cardiology

## 2016-03-27 ENCOUNTER — Ambulatory Visit (INDEPENDENT_AMBULATORY_CARE_PROVIDER_SITE_OTHER): Payer: Medicare Other | Admitting: Cardiology

## 2016-03-27 DIAGNOSIS — I429 Cardiomyopathy, unspecified: Secondary | ICD-10-CM | POA: Diagnosis not present

## 2016-03-27 DIAGNOSIS — E785 Hyperlipidemia, unspecified: Secondary | ICD-10-CM

## 2016-03-27 DIAGNOSIS — I2510 Atherosclerotic heart disease of native coronary artery without angina pectoris: Secondary | ICD-10-CM

## 2016-03-27 DIAGNOSIS — Z9114 Patient's other noncompliance with medication regimen: Secondary | ICD-10-CM

## 2016-03-27 DIAGNOSIS — I10 Essential (primary) hypertension: Secondary | ICD-10-CM

## 2016-03-27 DIAGNOSIS — N182 Chronic kidney disease, stage 2 (mild): Secondary | ICD-10-CM | POA: Diagnosis not present

## 2016-03-27 DIAGNOSIS — I428 Other cardiomyopathies: Secondary | ICD-10-CM

## 2016-03-27 NOTE — Assessment & Plan Note (Signed)
12/2007 Echo: EF 45-50%;  b. 07/2014 Echo: EF 20-25%, diff HK, mod dil LA, sev dil RA, mild TR

## 2016-03-27 NOTE — Assessment & Plan Note (Signed)
Seen in July 2017 at [facility]- they will follow renal

## 2016-03-27 NOTE — Patient Instructions (Signed)
Follow-Up  Your physician wants you to follow-up in: 6 months with Dr. Percival Spanish. You will receive a reminder letter in the mail two months in advance. If you don't receive a letter, please call our office to schedule the follow-up appointment.  If you need a refill on your cardiac medications before your next appointment, please call your pharmacy.

## 2016-03-27 NOTE — Assessment & Plan Note (Signed)
Controlled.  

## 2016-03-27 NOTE — Assessment & Plan Note (Signed)
Followed by PCP

## 2016-03-27 NOTE — Assessment & Plan Note (Signed)
Jan 2016

## 2016-03-27 NOTE — Assessment & Plan Note (Signed)
Keep medications QD wherever possible to improve compliance

## 2016-03-27 NOTE — Progress Notes (Signed)
03/27/2016 Jerome Adams   1961/06/05  [id number]  Primary Physician Jerome Jewel, MD Primary Cardiologist: Dr Jerome Adams  HPI:  55 y/o AA male, originally from [place], moved her in the 20's. He has a history of polysubstance abuse, Hepatitis C, CRI-2, and NICM. His substance abuse has been stable. His last EF assessment was by echo Jan 2016- EF 20-25%. Cath showed non obstructive CAD. Though his substance issues stabilized he did have problems with dietary and medications compliance. Dr Jerome Adams saw him in June 2017 and adjusted the pt's medications to reflect this (he is on Toprol 100 mg daily as opposed to Coreg BID). The pt was seen recently at [facility] for renal insufficiency his SCr had risen to 1.5. They feel he has CRI-2 and recommended periodic BMPs as well as avoiding NSAIDs. They also suggested he follow up with his cardiologist and that is the reason for today's visit.           The pt denies any history of unusual dyspnea, LE edema, or orthopnea. He is compliant with medications he takes daily but often misses his PM dose of Demadex and Lovaza. He tells me when he is fluid overloaded "it's all in my gut" as opposed to LE edema.     Current Outpatient Prescriptions  Medication Sig Dispense Refill  . albuterol (PROVENTIL HFA;VENTOLIN HFA) 108 (90 BASE) MCG/ACT inhaler Inhale 2-4 puffs into the lungs every 6 (six) hours as needed for wheezing or shortness of breath. 1 Inhaler 0  . aspirin EC 81 MG EC tablet Take 1 tablet (81 mg total) by mouth daily. 30 tablet 0  . gemfibrozil (LOPID) 600 MG tablet Take 1 tablet (600 mg total) by mouth 2 (two) times daily before a meal. 60 tablet 5  . losartan (COZAAR) 100 MG tablet Take 1 tablet (100 mg total) by mouth daily. 30 tablet 11  . lovastatin (MEVACOR) 40 MG tablet Take 60 mg by mouth at bedtime.    . metolazone (ZAROXOLYN) 2.5 MG tablet Take 1 tablet (2.5 mg total) by mouth daily. 6 tablet 0  . metoprolol succinate (TOPROL-XL) 100 MG  24 hr tablet Take 1 tablet (100 mg total) by mouth daily. Take with or immediately following a meal. 30 tablet 11  . Multiple Vitamins-Minerals (MULTIVITAMIN PO) Take 1 tablet by mouth daily.    [name] omega-3 acid ethyl esters (LOVAZA) 1 G capsule Take 1 capsule (1 g total) by mouth 2 (two) times daily. 60 capsule 5  . omeprazole (PRILOSEC) 40 MG capsule Take 1 capsule (40 mg total) by mouth daily. 30 capsule 0  . oxycodone (ROXICODONE) 30 MG immediate release tablet Take 1 tablet by mouth 4 (four) times daily.    . REQUIP 0.5 MG tablet 2 each night after dinner 60 tablet 5  . sertraline (ZOLOFT) 100 MG tablet Take 100 mg by mouth daily.    [name] spironolactone (ALDACTONE) 25 MG tablet Take 0.5 tablets (12.5 mg total) by mouth daily. 15 tablet 11  . testosterone cypionate (DEPOTESTOTERONE CYPIONATE) 200 MG/ML injection Inject 200 mg into the muscle once a week. Once per week    . torsemide (DEMADEX) 20 MG tablet Take 2 tablets (40 mg total) by mouth 2 (two) times daily. 120 tablet 11   No current facility-administered medications for this visit.     Allergies  Allergen Reactions  . Hydrocodone-Acetaminophen Itching    Social History   Social History  . Marital status: Divorced    Spouse name:  N/A  . Number of children: 1  . Years of education: N/A   Occupational History  . UNEMPLOYEED Unemployed    on disability.    Social History Main Topics  . Smoking status: Current Some Day Smoker    Packs/day: 0.50    Years: 40.00    Types: Cigarettes  . Smokeless tobacco: Never Used  . Alcohol use 2.4 oz/week    4 Cans of beer per week     Comment: Has been drinking since age 75.  Has averaged 4 22 oz cans of beer daily until late 2015.  . Drug use: No     Comment: 03/30/2013 "[place]"  . Sexual activity: Not on file   Other Topics Concern  . Not on file   Social History Narrative   Lives in [place] by himself.  Does not routinely exercise.     Review of Systems: General: negative  for chills, fever, night sweats or weight changes.  Cardiovascular: negative for chest pain, dyspnea on exertion, edema, orthopnea, palpitations, paroxysmal nocturnal dyspnea or shortness of breath Dermatological: negative for rash Respiratory: negative for cough or wheezing Urologic: negative for hematuria Abdominal: negative for nausea, vomiting, diarrhea, bright red blood per rectum, melena, or hematemesis Neurologic: negative for visual changes, syncope, or dizziness All other systems reviewed and are otherwise negative except as noted above.    Blood pressure 123/80, pulse (!) 52, height 5\' 10"  (1.778 m), weight 211 lb 12.8 oz (96.1 kg).  General appearance: alert, cooperative and no distress Neck: no JVD Lungs: clear to auscultation bilaterally Heart: regular rate and rhythm Extremities: extremities normal, atraumatic, no cyanosis or edema Skin: Skin color, texture, turgor normal. No rashes or lesions Neurologic: Grossly normal  EKG NSR, SB-HR 52 TWI V-4 to V-6  ASSESSMENT AND PLAN:   NICM (nonischemic cardiomyopathy) 12/2007 Echo: EF 45-50%;  b. 07/2014 Echo: EF 20-25%, diff HK, mod dil LA, sev dil RA, mild TR  Mild coronary artery disease Jan 2016  Hyperlipidemia Followed by PCP  CKD (chronic kidney disease), stage II Seen in July 2017 at [facility]- they will follow renal  Essential hypertension Controlled  Non compliance w medication regimen Keep medications QD wherever possible to improve compliance   PLAN I reviewed the office visit note from Jerome Seton PA from [facility]. I did not change Mr Jerome Adams's medications. He should follow up with Dr Jerome Adams in 6 months.  We discussed the importance of low sodium diet, medication compliance, and avoidence of NSAIDs (he has chronic back pain issues).   Kerin Ransom PA-C 03/27/2016 3:18 PM

## 2016-04-01 NOTE — Addendum Note (Signed)
Addended by: Vennie Homans on: 04/01/2016 11:47 AM   Modules accepted: Orders

## 2016-05-01 ENCOUNTER — Other Ambulatory Visit: Payer: Self-pay | Admitting: Physical Medicine and Rehabilitation

## 2016-05-01 DIAGNOSIS — M961 Postlaminectomy syndrome, not elsewhere classified: Secondary | ICD-10-CM

## 2016-05-19 ENCOUNTER — Other Ambulatory Visit: Payer: Medicare Other

## 2016-05-19 ENCOUNTER — Inpatient Hospital Stay
Admission: RE | Admit: 2016-05-19 | Discharge: 2016-05-19 | Disposition: A | Discharge status: Home / Self Care | Payer: Medicare Other | Source: Ambulatory Visit | Attending: Physical Medicine and Rehabilitation | Admitting: Physical Medicine and Rehabilitation

## 2016-05-19 ENCOUNTER — Other Ambulatory Visit: Payer: Self-pay

## 2016-05-19 NOTE — Discharge Instructions (Signed)
Myelogram Discharge Instructions  1. Go home and rest quietly for the next 24 hours.  It is important to lie flat for the next 24 hours.  Get up only to go to the restroom.  You may lie in the bed or on a couch on your back, your stomach, your left side or your right side.  You may have one pillow under your head.  You may have pillows between your knees while you are on your side or under your knees while you are on your back.  2. DO NOT drive today.  Recline the seat as far back as it will go, while still wearing your seat belt, on the way home.  3. You may get up to go to the bathroom as needed.  You may sit up for 10 minutes to eat.  You may resume your normal diet and medications unless otherwise indicated.  Drink lots of extra fluids today and tomorrow.  4. The incidence of headache, nausea, or vomiting is about 5% (one in 20 patients).  If you develop a headache, lie flat and drink plenty of fluids until the headache goes away.  Caffeinated beverages may be helpful.  If you develop severe nausea and vomiting or a headache that does not go away with flat bed rest, call [phone].  5. You may resume normal activities after your 24 hours of bed rest is over; however, do not exert yourself strongly or do any heavy lifting tomorrow. If when you get up you have a headache when standing, go back to bed and force fluids for another 24 hours.  6. Call your physician for a follow-up appointment.  The results of your myelogram will be sent directly to your physician by the following day.  7. If you have any questions or if complications develop after you arrive home, please call [phone].  Discharge instructions have been explained to the patient.  The patient, or the person responsible for the patient, fully understands these instructions.       MAY RESUME SERTRALINE ON OCT. 24, 2017, AFTER 9:30 AM.

## 2016-05-27 ENCOUNTER — Ambulatory Visit
Admission: RE | Admit: 2016-05-27 | Discharge: 2016-05-27 | Disposition: A | Discharge status: Home / Self Care | Payer: Medicare Other | Source: Ambulatory Visit | Attending: Physical Medicine and Rehabilitation | Admitting: Physical Medicine and Rehabilitation

## 2016-05-27 VITALS — BP 117/68 | HR 81

## 2016-05-27 DIAGNOSIS — M961 Postlaminectomy syndrome, not elsewhere classified: Secondary | ICD-10-CM

## 2016-05-27 DIAGNOSIS — M5106 Intervertebral disc disorders with myelopathy, lumbar region: Secondary | ICD-10-CM

## 2016-05-27 MED ORDER — IOPAMIDOL (ISOVUE-M 200) INJECTION 41%
15.0000 mL | Freq: Once | INTRAMUSCULAR | Status: AC
  Administered 2016-05-27: 15 mL via INTRATHECAL

## 2016-05-27 MED ORDER — ONDANSETRON HCL 4 MG/2ML IJ SOLN
4.0000 mg | Freq: Once | INTRAMUSCULAR | Status: AC
  Administered 2016-05-27: 4 mg via INTRAMUSCULAR

## 2016-05-27 MED ORDER — MEPERIDINE HCL 100 MG/ML IJ SOLN
75.0000 mg | Freq: Once | INTRAMUSCULAR | Status: AC
  Administered 2016-05-27: 75 mg via INTRAMUSCULAR

## 2016-05-27 MED ORDER — DIAZEPAM 5 MG PO TABS
10.0000 mg | ORAL_TABLET | Freq: Once | ORAL | Status: AC
  Administered 2016-05-27: 10 mg via ORAL

## 2016-05-27 NOTE — Progress Notes (Signed)
Patient states he has been off Sertraline for at least the past two days.  jkl

## 2016-05-27 NOTE — Discharge Instructions (Signed)
Myelogram Discharge Instructions  1. Go home and rest quietly for the next 24 hours.  It is important to lie flat for the next 24 hours.  Get up only to go to the restroom.  You may lie in the bed or on a couch on your back, your stomach, your left side or your right side.  You may have one pillow under your head.  You may have pillows between your knees while you are on your side or under your knees while you are on your back.  2. DO NOT drive today.  Recline the seat as far back as it will go, while still wearing your seat belt, on the way home.  3. You may get up to go to the bathroom as needed.  You may sit up for 10 minutes to eat.  You may resume your normal diet and medications unless otherwise indicated.  Drink lots of extra fluids today and tomorrow.  4. The incidence of headache, nausea, or vomiting is about 5% (one in 20 patients).  If you develop a headache, lie flat and drink plenty of fluids until the headache goes away.  Caffeinated beverages may be helpful.  If you develop severe nausea and vomiting or a headache that does not go away with flat bed rest, call [phone].  5. You may resume normal activities after your 24 hours of bed rest is over; however, do not exert yourself strongly or do any heavy lifting tomorrow. If when you get up you have a headache when standing, go back to bed and force fluids for another 24 hours.  6. Call your physician for a follow-up appointment.  The results of your myelogram will be sent directly to your physician by the following day.  7. If you have any questions or if complications develop after you arrive home, please call [phone].  Discharge instructions have been explained to the patient.  The patient, or the person responsible for the patient, fully understands these instructions.        May resume Sertraline on Nov. 1, 2017, after 10:30 am.

## 2016-07-17 ENCOUNTER — Ambulatory Visit: Payer: Medicare Other | Admitting: Physician Assistant

## 2016-08-04 ENCOUNTER — Ambulatory Visit (INDEPENDENT_AMBULATORY_CARE_PROVIDER_SITE_OTHER): Payer: Medicare Other | Admitting: Physician Assistant

## 2016-08-04 ENCOUNTER — Encounter: Payer: Self-pay | Admitting: Physician Assistant

## 2016-08-04 VITALS — BP 150/80 | HR 76 | Ht 70.0 in | Wt 229.0 lb

## 2016-08-04 DIAGNOSIS — F1010 Alcohol abuse, uncomplicated: Secondary | ICD-10-CM

## 2016-08-04 DIAGNOSIS — B182 Chronic viral hepatitis C: Secondary | ICD-10-CM

## 2016-08-04 DIAGNOSIS — R194 Change in bowel habit: Secondary | ICD-10-CM | POA: Diagnosis not present

## 2016-08-04 DIAGNOSIS — R140 Abdominal distension (gaseous): Secondary | ICD-10-CM | POA: Diagnosis not present

## 2016-08-04 DIAGNOSIS — R1030 Lower abdominal pain, unspecified: Secondary | ICD-10-CM

## 2016-08-04 DIAGNOSIS — K219 Gastro-esophageal reflux disease without esophagitis: Secondary | ICD-10-CM

## 2016-08-04 DIAGNOSIS — R1013 Epigastric pain: Secondary | ICD-10-CM

## 2016-08-04 DIAGNOSIS — Z86010 Personal history of colonic polyps: Secondary | ICD-10-CM

## 2016-08-04 MED ORDER — POLYETHYLENE GLYCOL 3350 17 GM/SCOOP PO POWD: 17.0000 g | Freq: Every day | ORAL | 3 refills | Status: DC

## 2016-08-04 MED ORDER — NA SULFATE-K SULFATE-MG SULF 17.5-3.13-1.6 GM/177ML PO SOLN: 1.0000 | ORAL | 0 refills | Status: DC

## 2016-08-04 NOTE — Patient Instructions (Addendum)
You have been scheduled for an endoscopy and colonoscopy. Please follow the written instructions given to you at your visit today. Please pick up your prep supplies at the pharmacy within the next 1-3 days. If you use inhalers (even only as needed), please bring them with you on the day of your procedure. Your physician has requested that you go to www.startemmi.com and enter the access code given to you at your visit today. This web site gives a general overview about your procedure. However, you should still follow specific instructions given to you by our office regarding your preparation for the procedure.  Limit fast food/greasy food.  We have given you a high fiber diet handout. Please strive to have 25-30 mg of fiber daily.   Please purchase the following medications over the counter and take as directed: Metamucil or other fiber supplement  Your provider suggest that you drink more water. Try to have at least 6-8 8 oz glasses of water daily.   We have sent the following medications to your pharmacy for you to pick up at your convenience: Miralax take one dose daily in water or juice.

## 2016-08-04 NOTE — Progress Notes (Signed)
Chief Complaint: Abdominal Pain, Bloating, Change in Bowel Habits, H/o Adenomatous polyps  HPI:  [name] is a 56 y/o male with a past medical history of chronic pain, hep C positive, hypertension, GERD, hypertension triglyceridemia, spinal stenosis, DDD, CHF with last echo 08/01/14 showing an EF 20-25%, as well as a history of adenomatous colon polyps in 2009 and 2013, who presents to clinic today as a referral from Dr. Sheryle Hail for bloating and abdominal pain. Patient has followed with Dr. Ardis Hughs in the past.    His last endoscopy was completed 02/27/14 and showed moderate, nonspecific pangastritis. There was a small, soft pyloric channel nodule which was biopsied and the exam was otherwise normal. The patient was started on once daily over-the-counter omeprazole. Of note patient's last colonoscopy was 10/2011 with a finding of 2 subcentimeter polyps, one was a tubular adenoma and recommendations were for repeat colonoscopy in 5 years.   Today, the patient presents to clinic and tells me that he has had bloating of his abdomen "forever". This has worsened over the past couple of months with an increase in flatulence and eructations constantly throughout the day. He is on Oxycodone 30 mg 4 times daily for chronic back pain, but tells me that he feels like he has daily bowel movements. He discusses that he has at least 3-4 bowel movements per day, the first one is typically normal "full", but after this he has 3 episodes of "pieces of stool which are sometimes loose".     Patient also describes some abdominal cramping in his epigastrum right after he eats occasionally.  He tells me he can "feel the food hit my stomach". He does admit that he doesn't have the best of diet, noting that he typically eats a lot of fast/fried food because he "doesn't have a significant other" to cook for him. Patient admits to a 4-5 pound weight gain over the past 4-5 months. He also describes a lower abdominal pain which occurs  intermittently, this seems to be somewhat better after a bowel movement.   Patient has a history of gastritis and is currently on Omeprazole 40 mg daily, but does have occasional breakthrough reflux symptoms.   Patient denies fever, chills, blood in his stool, melena, new medications, changes in medications, recent antibiotics, fatigue, anorexia, nausea, vomiting or dysphagia.  Past Medical History:  Diagnosis Date  . Alcohol abuse   . Allergy   . Arthritis    "joints" (03/30/2013)  . Chronic lower back pain   . Chronic pain syndrome   . Chronic pancreatitis ([place])   . CKD (chronic kidney disease), stage II   . Depression   . Drug abuse    a. Remote drug abuse (cocaine). UDS 07/2014 neg for cocaine.  . Essential hypertension   . GERD (gastroesophageal reflux disease)    takes  otc  . Hepatitis C 2010   a. never treated - undectable virus 12/2013.  [name] Hyperlipidemia   . Hypertriglyceridemia   . NICM (nonischemic cardiomyopathy) ([place])    a. 12/2007 Echo: EF 45-50%;  b. 07/2014 Echo: EF 20-25%, diff HK, mod dil LA, sev dil RA, mild TR, PASP 31 mmHg.  . PUD (peptic ulcer disease)   . Sleep apnea    was tested here at [facility].Marland KitchenMarland KitchenMarland KitchenNever heard anymore about it (03/30/2013)  . Spinal cord stimulator status    has had for 1 yr--inserted by [facility]  . Tobacco abuse   . Transaminitis     Past Surgical History:  Procedure Laterality Date  . ANTERIOR CERVICAL DECOMP/DISCECTOMY FUSION  04/05/2012   Procedure: ANTERIOR CERVICAL DECOMPRESSION/DISCECTOMY FUSION 1 LEVEL;  Surgeon: Elaina Hoops, MD;  Location: [facility];  Service: Neurosurgery;  Laterality: N/A;  Cervical five-six anterior cervical decompression and fusion  . BACK SURGERY    . FOOT SURGERY Right 2012?   "took piece off that was hurting me" (03/30/2013)  . HARDWARE REMOVAL     "took screw out of my back; it had damaged my nerve" (03/30/2013)  . INCISION AND DRAINAGE ABSCESS Left    "lower jaw, tooth abscess" (03/30/2013)  . LEFT HEART  CATHETERIZATION WITH CORONARY ANGIOGRAM N/A 08/03/2014   Procedure: LEFT HEART CATHETERIZATION WITH CORONARY ANGIOGRAM;  Surgeon: Blane Ohara, MD;  Location: [facility] CATH LAB;  Service: Cardiovascular;  Laterality: N/A;  . POSTERIOR LUMBAR FUSION     "L4-5" (03/31/2103)  . SPINAL CORD STIMULATOR IMPLANT  11/14/2012  . WRIST SURGERY Right 1980's   repair of tendons and nerves    Current Outpatient Prescriptions  Medication Sig Dispense Refill  . albuterol (PROVENTIL HFA;VENTOLIN HFA) 108 (90 BASE) MCG/ACT inhaler Inhale 2-4 puffs into the lungs every 6 (six) hours as needed for wheezing or shortness of breath. 1 Inhaler 0  . aspirin EC 81 MG EC tablet Take 1 tablet (81 mg total) by mouth daily. 30 tablet 0  . gemfibrozil (LOPID) 600 MG tablet Take 1 tablet (600 mg total) by mouth 2 (two) times daily before a meal. 60 tablet 5  . lovastatin (MEVACOR) 40 MG tablet Take 60 mg by mouth at bedtime.    . metolazone (ZAROXOLYN) 2.5 MG tablet Take 1 tablet (2.5 mg total) by mouth daily. 6 tablet 0  . metoprolol succinate (TOPROL-XL) 100 MG 24 hr tablet Take 1 tablet (100 mg total) by mouth daily. Take with or immediately following a meal. 30 tablet 11  . Multiple Vitamins-Minerals (MULTIVITAMIN PO) Take 1 tablet by mouth daily.    [name] omega-3 acid ethyl esters (LOVAZA) 1 G capsule Take 1 capsule (1 g total) by mouth 2 (two) times daily. 60 capsule 5  . omeprazole (PRILOSEC) 40 MG capsule Take 1 capsule (40 mg total) by mouth daily. 30 capsule 0  . oxycodone (ROXICODONE) 30 MG immediate release tablet Take 1 tablet by mouth 4 (four) times daily.    . REQUIP 0.5 MG tablet 2 each night after dinner 60 tablet 5  . sertraline (ZOLOFT) 100 MG tablet Take 100 mg by mouth daily.    [name] spironolactone (ALDACTONE) 25 MG tablet     . testosterone cypionate (DEPOTESTOTERONE CYPIONATE) 200 MG/ML injection Inject 200 mg into the muscle once a week. Once per week    . torsemide (DEMADEX) 20 MG tablet Take 2 tablets (40 mg  total) by mouth 2 (two) times daily. 120 tablet 11   No current facility-administered medications for this visit.     Allergies as of 08/04/2016 - Review Complete 08/04/2016  Allergen Reaction Noted  . Hydrocodone-acetaminophen Itching     Family History  Problem Relation Age of Onset  . Heart disease Father     H/o MI followed by SCD @ 39.  [name] Heart disease Sister     Died suddenly @ 15 - h/o CHF s/p AICD.  [name] Breast cancer Sister   . Bone cancer Mother   . Esophageal cancer Neg Hx   . Stomach cancer Neg Hx     Social History   Social History  . Marital status:  Divorced    Spouse name: N/A  . Number of children: 1  . Years of education: N/A   Occupational History  . UNEMPLOYEED Unemployed    on disability.    Social History Main Topics  . Smoking status: Current Some Day Smoker    Packs/day: 0.50    Years: 40.00    Types: Cigarettes  . Smokeless tobacco: Never Used  . Alcohol use 2.4 oz/week    4 Cans of beer per week     Comment: Has been drinking since age 2.  Has averaged 4 22 oz cans of beer daily until late 2015.  . Drug use: No     Comment: 03/30/2013 "[place]"  . Sexual activity: Not on file   Other Topics Concern  . Not on file   Social History Narrative   Lives in [place] by himself.  Does not routinely exercise.    Review of Systems:    Constitutional: No weight loss, fever, chills, weakness or fatigue Cardiovascular: No chest pain Respiratory: No SOB  Gastrointestinal: See HPI and otherwise negative Neurological: No headache Musculoskeletal: No new muscle pain Hematologic: No bleeding  Psychiatric: Positive for depression   Physical Exam:  Vital signs: BP (!) 150/80   Pulse 76   Ht 5\' 10"  (1.778 m)   Wt 229 lb (103.9 kg)   SpO2 95%   BMI 32.86 kg/m   Constitutional:   Pleasant African-American male appears to be in NAD, Well developed, Well nourished, alert and cooperative Respiratory: Respirations even and unlabored. Lungs  clear to auscultation bilaterally.   No wheezes, crackles, or rhonchi.  Cardiovascular: Normal S1, S2. No MRG. Regular rate and rhythm. No peripheral edema, cyanosis or pallor.  Gastrointestinal: overweight, Soft, Mild distension, mild ttp in the epigastrum and lower abdomen. No rebound or guarding. Decreased BS all 4 quads. No appreciable masses or hepatomegaly. Rectal:  Not performed.  Msk:  Symmetrical without gross deformities. Without edema, no deformity or joint abnormality.  Neurologic:  Alert and  oriented x4;  grossly normal neurologically.  Skin:   Dry and intact without significant lesions or rashes. Psychiatric: Demonstrates good judgement and reason without abnormal affect or behaviors.  Recent labs: 06/25/16-CMP: Glucose elevated at 169, carbon dioxide elevated at 35 and calcium elevated at 10.3, others normal 06/25/16-CBC: Hemoglobin elevated at 19.5, otherwise normal  Assessment: 1. Epigastric pain: Patient complains of epigastric cramping pain which occurs sometimes right after he swallows food, he feels like he "feels the food hit my stomach"; consider relation to known gastritis versus other 2. Lower abdominal pain: Patient describes intermittent lower abdominal pain, sometimes relieved by bowel movement, consider relation to IBS versus constipation likely due to chronic opioid use 3. Alcohol abuse: Patient is a chronic alcohol drinker and is down to 1 beer per day 4. Hepatitis C: This has never been treated but viral count was undetectable at last check, likely patient needs further evaluation for possible liver disease including abdominal imaging 5. GERD: Patient describes some breakthrough symptoms regardless of Omeprazole 40 mg daily 6. Bloating: Likely a combination of IBS and poor diet as well as constipation 7. Change in bowel habits: Patient notes that he has had a change in bowel habits describing what sounds like constipation with a normal bowel movement in the  morning but with "pieces of stool throughout the day and not feeling empty"; likely related to opioid use 8. History of adenomatous polyps: Last colonoscopy in 2013 with recommendations for repeat in 5  years  Plan: 1. At this time recommend EGD and colonoscopy for further evaluation. Discussed risks, benefits, limitations and alternatives and the patient agrees to proceed. These will be scheduled in the hospital due to patient's last EF of 20-25%. These were scheduled with Dr. Ardis Hughs. 2. Recommend the patient increase fiber in his diet to at least 25-35 g per day with use of fiber supplement such as Meatmucil, Citrucel or Benefiber. We did discuss this in detail. 3. Recommend the patient use MiraLAX once daily. Prescribed polyethylene glycol. He can decrease this to half a dose if this causes loose stools. 4. Patient to limit greasy/fast food 5. Recommend patient increase his Omeprazole 40 mg to twice daily to see if this helps with his epigastric pain/breakthrough reflux 6. In the future could consider abdominal imaging including a CT for further evaluation of his chronic hepatitis C and question of ascites 7. Patient to continue his Torsemide 20 mg, 2 tabs twice a day and spironolactone 25 mg daily. Patient has admitted to me today that he typically only takes medications once daily but is trying to ensure he takes some twice a day. This would likely help with any fluid accumulation. 8. Patient will need follow-up in the clinic for further discussion regarding his possible liver disease. This can be completed after his EGD and colonoscopy for acute symptoms. 9. Patient to follow in clinic per recommendations from Dr. Ardis Hughs after time of procedures.  Ellouise Newer, PA-C [facility] 08/04/2016, 10:07 AM  Cc: Jerome Heinz, MD  Cc: Dr. Antonietta Adams [facility]

## 2016-08-04 NOTE — Progress Notes (Signed)
I agree with the above note, plan 

## 2016-08-27 NOTE — Anesthesia Preprocedure Evaluation (Addendum)
Anesthesia Evaluation    Reviewed: Allergy & Precautions, Patient's Chart, lab work & pertinent test results  Airway Mallampati: III  TM Distance: >3 FB Neck ROM: Full    Dental  (+) Teeth Intact, Dental Advisory Given   Pulmonary sleep apnea , COPD,  COPD inhaler, Current Smoker,    Pulmonary exam normal breath sounds clear to auscultation       Cardiovascular hypertension, Pt. on home beta blockers and Pt. on medications + CAD and +CHF  Normal cardiovascular exam Rhythm:Regular Rate:Normal  Study Conclusions  - Left ventricle: The cavity size was normal. There was moderateconcentric hypertrophy. Systolic function was severely reduced.The estimated ejection fraction was in the range of 20% to 25%.Diffuse hypokinesis. The study is not technically sufficient toallow evaluation of LV diastolic function. - Left atrium: Moderately dilated at 41 ml/m2. - Right ventricle: The cavity size was mildly dilated. Wall thickness was normal. - Right atrium: Severely dilated at 32 cm2. - Tricuspid valve: There was mild regurgitation. - Pulmonary arteries: PA peak pressure: 31 mm Hg (S).  Impressions:  - Compared to a prior echo in 2009, the EF has substantiallydecreased to 20-25% with severe global hypokinesis, mild RVenlargement with normal RV systolic function, and biatrialenlargment (R>L)   Neuro/Psych PSYCHIATRIC DISORDERS Depression SCS implant  Neuromuscular disease    GI/Hepatic PUD, GERD  Medicated,(+)     substance abuse  alcohol use, Hepatitis -, CChronic pancreatitis   Endo/Other  Obesity   Renal/GU Renal InsufficiencyRenal disease     Musculoskeletal  (+) Arthritis , Osteoarthritis,    Abdominal   Peds  Hematology negative hematology ROS (+)   Anesthesia Other Findings Day of surgery medications reviewed with the patient.  Reproductive/Obstetrics                             Anesthesia Physical Anesthesia Plan  ASA: IV  Anesthesia Plan: MAC   Post-op Pain Management:    Induction: Intravenous  Airway Management Planned: Nasal Cannula  Additional Equipment:   Intra-op Plan:   Post-operative Plan:   Informed Consent: I have reviewed the patients History and Physical, chart, labs and discussed the procedure including the risks, benefits and alternatives for the proposed anesthesia with the patient or authorized representative who has indicated his/her understanding and acceptance.   Dental advisory given  Plan Discussed with: CRNA and Anesthesiologist  Anesthesia Plan Comments: (Discussed risks/benefits/alternatives to MAC sedation including need for ventilatory support, hypotension, need for conversion to general anesthesia.  All patient questions answered.  Patient/guardian wishes to proceed.)        Anesthesia Quick Evaluation

## 2016-08-28 ENCOUNTER — Ambulatory Visit (HOSPITAL_COMMUNITY)
Admission: RE | Admit: 2016-08-28 | Discharge: 2016-08-28 | Disposition: A | Discharge status: Home / Self Care | Payer: Medicare Other | Source: Ambulatory Visit | Attending: Gastroenterology | Admitting: Gastroenterology

## 2016-08-28 ENCOUNTER — Encounter (HOSPITAL_COMMUNITY)
Admission: RE | Disposition: A | Discharge status: Home / Self Care | Payer: Self-pay | Source: Ambulatory Visit | Attending: Gastroenterology

## 2016-08-28 ENCOUNTER — Ambulatory Visit (HOSPITAL_COMMUNITY): Payer: Medicare Other | Admitting: Anesthesiology

## 2016-08-28 ENCOUNTER — Encounter (HOSPITAL_COMMUNITY): Payer: Self-pay | Admitting: Gastroenterology

## 2016-08-28 DIAGNOSIS — R140 Abdominal distension (gaseous): Secondary | ICD-10-CM | POA: Diagnosis not present

## 2016-08-28 DIAGNOSIS — K2970 Gastritis, unspecified, without bleeding: Secondary | ICD-10-CM

## 2016-08-28 DIAGNOSIS — D122 Benign neoplasm of ascending colon: Secondary | ICD-10-CM

## 2016-08-28 DIAGNOSIS — J449 Chronic obstructive pulmonary disease, unspecified: Secondary | ICD-10-CM | POA: Diagnosis not present

## 2016-08-28 DIAGNOSIS — B182 Chronic viral hepatitis C: Secondary | ICD-10-CM | POA: Diagnosis not present

## 2016-08-28 DIAGNOSIS — N182 Chronic kidney disease, stage 2 (mild): Secondary | ICD-10-CM | POA: Insufficient documentation

## 2016-08-28 DIAGNOSIS — Z86010 Personal history of colonic polyps: Secondary | ICD-10-CM | POA: Insufficient documentation

## 2016-08-28 DIAGNOSIS — M549 Dorsalgia, unspecified: Secondary | ICD-10-CM | POA: Insufficient documentation

## 2016-08-28 DIAGNOSIS — K219 Gastro-esophageal reflux disease without esophagitis: Secondary | ICD-10-CM | POA: Diagnosis not present

## 2016-08-28 DIAGNOSIS — I428 Other cardiomyopathies: Secondary | ICD-10-CM | POA: Insufficient documentation

## 2016-08-28 DIAGNOSIS — D120 Benign neoplasm of cecum: Secondary | ICD-10-CM | POA: Diagnosis not present

## 2016-08-28 DIAGNOSIS — I2510 Atherosclerotic heart disease of native coronary artery without angina pectoris: Secondary | ICD-10-CM | POA: Diagnosis not present

## 2016-08-28 DIAGNOSIS — Z6832 Body mass index (BMI) 32.0-32.9, adult: Secondary | ICD-10-CM | POA: Insufficient documentation

## 2016-08-28 DIAGNOSIS — K2990 Gastroduodenitis, unspecified, without bleeding: Secondary | ICD-10-CM

## 2016-08-28 DIAGNOSIS — R194 Change in bowel habit: Secondary | ICD-10-CM

## 2016-08-28 DIAGNOSIS — R1030 Lower abdominal pain, unspecified: Secondary | ICD-10-CM | POA: Diagnosis not present

## 2016-08-28 DIAGNOSIS — K3184 Gastroparesis: Secondary | ICD-10-CM | POA: Insufficient documentation

## 2016-08-28 DIAGNOSIS — I509 Heart failure, unspecified: Secondary | ICD-10-CM | POA: Insufficient documentation

## 2016-08-28 DIAGNOSIS — F1010 Alcohol abuse, uncomplicated: Secondary | ICD-10-CM | POA: Diagnosis not present

## 2016-08-28 DIAGNOSIS — K319 Disease of stomach and duodenum, unspecified: Secondary | ICD-10-CM | POA: Insufficient documentation

## 2016-08-28 DIAGNOSIS — R1013 Epigastric pain: Secondary | ICD-10-CM | POA: Diagnosis not present

## 2016-08-28 DIAGNOSIS — F329 Major depressive disorder, single episode, unspecified: Secondary | ICD-10-CM | POA: Diagnosis not present

## 2016-08-28 DIAGNOSIS — I130 Hypertensive heart and chronic kidney disease with heart failure and stage 1 through stage 4 chronic kidney disease, or unspecified chronic kidney disease: Secondary | ICD-10-CM | POA: Diagnosis not present

## 2016-08-28 DIAGNOSIS — F17210 Nicotine dependence, cigarettes, uncomplicated: Secondary | ICD-10-CM | POA: Diagnosis not present

## 2016-08-28 DIAGNOSIS — G8929 Other chronic pain: Secondary | ICD-10-CM | POA: Diagnosis not present

## 2016-08-28 DIAGNOSIS — G4730 Sleep apnea, unspecified: Secondary | ICD-10-CM | POA: Insufficient documentation

## 2016-08-28 DIAGNOSIS — Z8711 Personal history of peptic ulcer disease: Secondary | ICD-10-CM | POA: Insufficient documentation

## 2016-08-28 DIAGNOSIS — E785 Hyperlipidemia, unspecified: Secondary | ICD-10-CM | POA: Diagnosis not present

## 2016-08-28 DIAGNOSIS — Z860101 Personal history of adenomatous and serrated colon polyps: Secondary | ICD-10-CM

## 2016-08-28 DIAGNOSIS — E669 Obesity, unspecified: Secondary | ICD-10-CM | POA: Insufficient documentation

## 2016-08-28 DIAGNOSIS — Z7982 Long term (current) use of aspirin: Secondary | ICD-10-CM | POA: Insufficient documentation

## 2016-08-28 DIAGNOSIS — Z79891 Long term (current) use of opiate analgesic: Secondary | ICD-10-CM | POA: Insufficient documentation

## 2016-08-28 DIAGNOSIS — Z79899 Other long term (current) drug therapy: Secondary | ICD-10-CM | POA: Insufficient documentation

## 2016-08-28 HISTORY — PX: ESOPHAGOGASTRODUODENOSCOPY (EGD) WITH PROPOFOL: SHX5813

## 2016-08-28 HISTORY — PX: COLONOSCOPY WITH PROPOFOL: SHX5780

## 2016-08-28 SURGERY — COLONOSCOPY WITH PROPOFOL
Anesthesia: Monitor Anesthesia Care

## 2016-08-28 MED ORDER — SODIUM CHLORIDE 0.9 % IV SOLN: INTRAVENOUS | Status: DC

## 2016-08-28 MED ORDER — LIDOCAINE 2% (20 MG/ML) 5 ML SYRINGE
INTRAMUSCULAR | Status: DC | PRN
  Administered 2016-08-28: 50 mg via INTRAVENOUS

## 2016-08-28 MED ORDER — PROPOFOL 500 MG/50ML IV EMUL
INTRAVENOUS | Status: DC | PRN
  Administered 2016-08-28: 100 ug/kg/min via INTRAVENOUS

## 2016-08-28 MED ORDER — PROPOFOL 10 MG/ML IV BOLUS
INTRAVENOUS | Status: DC | PRN
  Administered 2016-08-28: 20 mg via INTRAVENOUS

## 2016-08-28 MED ORDER — PHENYLEPHRINE 40 MCG/ML (10ML) SYRINGE FOR IV PUSH (FOR BLOOD PRESSURE SUPPORT)
PREFILLED_SYRINGE | INTRAVENOUS | Status: AC
  Filled 2016-08-28: qty 10

## 2016-08-28 MED ORDER — LACTATED RINGERS IV SOLN
INTRAVENOUS | Status: DC
  Administered 2016-08-28: 1000 mL via INTRAVENOUS

## 2016-08-28 MED ORDER — PHENYLEPHRINE HCL 10 MG/ML IJ SOLN: INTRAMUSCULAR | Status: DC | PRN

## 2016-08-28 MED ORDER — PHENYLEPHRINE HCL 10 MG/ML IJ SOLN
INTRAMUSCULAR | Status: DC | PRN
  Administered 2016-08-28 (×5): 80 ug via INTRAVENOUS

## 2016-08-28 MED ORDER — LIDOCAINE 2% (20 MG/ML) 5 ML SYRINGE
INTRAMUSCULAR | Status: AC
  Filled 2016-08-28: qty 5

## 2016-08-28 MED ORDER — PROPOFOL 10 MG/ML IV BOLUS
INTRAVENOUS | Status: AC
  Filled 2016-08-28: qty 60

## 2016-08-28 SURGICAL SUPPLY — 24 items

## 2016-08-28 NOTE — Op Note (Signed)
[facility] Patient Name: Jerome Adams Procedure Date: 08/28/2016 MRN: [id number] Attending MD: Milus Banister , MD Date of Birth: 1961/04/08 CSN: [id number] Age: 56 Admit Type: Outpatient Procedure:                Colonoscopy Indications:              High risk colon cancer surveillance: Personal                            history of colonic polyps (colonoscopy 2013 with                            two subCM adenomas), recent change in bowels,                            abdominal pain Providers:                Milus Banister, MD, Laverta Baltimore RN, RN,                            Elspeth Cho Tech., Technician, Dione Booze,                            CRNA Referring MD:              Medicines:                Monitored Anesthesia Care Complications:            No immediate complications. Estimated blood loss:                            None. Estimated Blood Loss:     Estimated blood loss: none. Procedure:                Pre-Anesthesia Assessment:                           - Prior to the procedure, a History and Physical                            was performed, and patient medications and                            allergies were reviewed. The patient's tolerance of                            previous anesthesia was also reviewed. The risks                            and benefits of the procedure and the sedation                            options and risks were discussed with the patient.                            All questions were answered, and informed consent  was obtained. Prior Anticoagulants: The patient has                            taken no previous anticoagulant or antiplatelet                            agents. ASA Grade Assessment: IV - A patient with                            severe systemic disease that is a constant threat                            to life. After reviewing the risks and benefits,               the patient was deemed in satisfactory condition to                            undergo the procedure.                           After obtaining informed consent, the colonoscope                            was passed under direct vision. Throughout the                            procedure, the patient's blood pressure, pulse, and                            oxygen saturations were monitored continuously. The                            [id number] scope was introduced through                            the anus and advanced to the the cecum, identified                            by appendiceal orifice and ileocecal valve. The                            colonoscopy was performed without difficulty. The                            patient tolerated the procedure well. The quality                            of the bowel preparation was good. The ileocecal                            valve, appendiceal orifice, and rectum were                            photographed. Scope In: [id number]  AM Scope Out: 7:45:44 AM Scope Withdrawal Time: 0 hours 4 minutes 35 seconds  Total Procedure Duration: 0 hours 7 minutes 3 seconds  Findings:      A 2 mm polyp was found in the ascending colon. The polyp was sessile.       The polyp was removed with a cold snare. Resection and retrieval were       complete.      The exam was otherwise without abnormality on direct and retroflexion       views. Impression:               - One 2 mm polyp in the ascending colon, removed                            with a cold snare. Resected and retrieved.                           - The examination was otherwise normal on direct                            and retroflexion views. Moderate Sedation:      N/A- Per Anesthesia Care Recommendation:           - Patient has a contact number available for                            emergencies. The signs and symptoms of potential                            delayed  complications were discussed with the                            patient. Return to normal activities tomorrow.                            Written discharge instructions were provided to the                            patient.                           - Resume previous diet.                           - Continue present medications.                           You will receive a letter within 2-3 weeks with the                            pathology results and my final recommendations.                           If the polyp(s) is proven to be 'pre-cancerous' on                            pathology,  you will need repeat colonoscopy in 5                            years. If the polyp(s) is NOT 'precancerous' on                            pathology then you should repeat colon cancer                            screening in 10 years with colonoscopy without need                            for colon cancer screening by any method prior to                            then (including stool testing). Procedure Code(s):        --- Professional ---                           [phone], Colonoscopy, flexible; with removal of                            tumor(s), polyp(s), or other lesion(s) by snare                            technique Diagnosis Code(s):        --- Professional ---                           Z86.010, Personal history of colonic polyps                           D12.2, Benign neoplasm of ascending colon CPT copyright 2016 American Medical Association. All rights reserved. The codes documented in this report are preliminary and upon coder review may  be revised to meet current compliance requirements. Milus Banister, MD 08/28/2016 7:49:40 AM This report has been signed electronically. Number of Addenda: 0

## 2016-08-28 NOTE — Interval H&P Note (Signed)
History and Physical Interval Note:  08/28/2016 7:12 AM  Jerome Adams  has presented today for surgery, with the diagnosis of GERD, lower abd pain, bloating, change in bowel habits, h/o polyps/db  The various methods of treatment have been discussed with the patient and family. After consideration of risks, benefits and other options for treatment, the patient has consented to  Procedure(s): COLONOSCOPY WITH PROPOFOL (N/A) ESOPHAGOGASTRODUODENOSCOPY (EGD) WITH PROPOFOL (N/A) as a surgical intervention .  The patient's history has been reviewed, patient examined, no change in status, stable for surgery.  I have reviewed the patient's chart and labs.  Questions were answered to the patient's satisfaction.     Milus Banister

## 2016-08-28 NOTE — H&P (View-Only) (Signed)
Chief Complaint: Abdominal Pain, Bloating, Change in Bowel Habits, H/o Adenomatous polyps  HPI:  Jerome Adams is a 56 y/o male with a past medical history of chronic pain, hep C positive, hypertension, GERD, hypertension triglyceridemia, spinal stenosis, DDD, CHF with last echo 08/01/14 showing an EF 20-25%, as well as a history of adenomatous colon polyps in 2009 and 2013, who presents to clinic today as a referral from Dr. Sheryle Hail for bloating and abdominal pain. Patient has followed with Dr. Ardis Hughs in the past.    His last endoscopy was completed 02/27/14 and showed moderate, nonspecific pangastritis. There was a small, soft pyloric channel nodule which was biopsied and the exam was otherwise normal. The patient was started on once daily over-the-counter omeprazole. Of note patient's last colonoscopy was 10/2011 with a finding of 2 subcentimeter polyps, one was a tubular adenoma and recommendations were for repeat colonoscopy in 5 years.   Today, the patient presents to clinic and tells me that he has had bloating of his abdomen "forever". This has worsened over the past couple of months with an increase in flatulence and eructations constantly throughout the day. He is on Oxycodone 30 mg 4 times daily for chronic back pain, but tells me that he feels like he has daily bowel movements. He discusses that he has at least 3-4 bowel movements per day, the first one is typically normal "full", but after this he has 3 episodes of "pieces of stool which are sometimes loose".     Patient also describes some abdominal cramping in his epigastrum right after he eats occasionally.  He tells me he can "feel the food hit my stomach". He does admit that he doesn't have the best of diet, noting that he typically eats a lot of fast/fried food because he "doesn't have a significant other" to cook for him. Patient admits to a 4-5 pound weight gain over the past 4-5 months. He also describes a lower abdominal pain which occurs  intermittently, this seems to be somewhat better after a bowel movement.   Patient has a history of gastritis and is currently on Omeprazole 40 mg daily, but does have occasional breakthrough reflux symptoms.   Patient denies fever, chills, blood in his stool, melena, new medications, changes in medications, recent antibiotics, fatigue, anorexia, nausea, vomiting or dysphagia.  Past Medical History:  Diagnosis Date  . Alcohol abuse   . Allergy   . Arthritis    "joints" (03/30/2013)  . Chronic lower back pain   . Chronic pain syndrome   . Chronic pancreatitis ([place])   . CKD (chronic kidney disease), stage II   . Depression   . Drug abuse    a. Remote drug abuse (cocaine). UDS 07/2014 neg for cocaine.  . Essential hypertension   . GERD (gastroesophageal reflux disease)    takes  otc  . Hepatitis C 2010   a. never treated - undectable virus 12/2013.  [name] Hyperlipidemia   . Hypertriglyceridemia   . NICM (nonischemic cardiomyopathy) ([place])    a. 12/2007 Echo: EF 45-50%;  b. 07/2014 Echo: EF 20-25%, diff HK, mod dil LA, sev dil RA, mild TR, PASP 31 mmHg.  . PUD (peptic ulcer disease)   . Sleep apnea    was tested [facility] at [facility].Marland KitchenMarland KitchenMarland KitchenNever heard anymore about it (03/30/2013)  . Spinal cord stimulator status    has had for 1 yr--inserted by [facility]  . Tobacco abuse   . Transaminitis     Past Surgical History:  Procedure Laterality Date  . ANTERIOR CERVICAL DECOMP/DISCECTOMY FUSION  04/05/2012   Procedure: ANTERIOR CERVICAL DECOMPRESSION/DISCECTOMY FUSION 1 LEVEL;  Surgeon: Elaina Hoops, MD;  Location: [facility];  Service: Neurosurgery;  Laterality: N/A;  Cervical five-six anterior cervical decompression and fusion  . BACK SURGERY    . FOOT SURGERY Right 2012?   "took piece off that was hurting me" (03/30/2013)  . HARDWARE REMOVAL     "took screw out of my back; it had damaged my nerve" (03/30/2013)  . INCISION AND DRAINAGE ABSCESS Left    "lower jaw, tooth abscess" (03/30/2013)  . LEFT HEART  CATHETERIZATION WITH CORONARY ANGIOGRAM N/A 08/03/2014   Procedure: LEFT HEART CATHETERIZATION WITH CORONARY ANGIOGRAM;  Surgeon: Blane Ohara, MD;  Location: [facility] CATH LAB;  Service: Cardiovascular;  Laterality: N/A;  . POSTERIOR LUMBAR FUSION     "L4-5" (03/31/2103)  . SPINAL CORD STIMULATOR IMPLANT  11/14/2012  . WRIST SURGERY Right 1980's   repair of tendons and nerves    Current Outpatient Prescriptions  Medication Sig Dispense Refill  . albuterol (PROVENTIL HFA;VENTOLIN HFA) 108 (90 BASE) MCG/ACT inhaler Inhale 2-4 puffs into the lungs every 6 (six) hours as needed for wheezing or shortness of breath. 1 Inhaler 0  . aspirin EC 81 MG EC tablet Take 1 tablet (81 mg total) by mouth daily. 30 tablet 0  . gemfibrozil (LOPID) 600 MG tablet Take 1 tablet (600 mg total) by mouth 2 (two) times daily before a meal. 60 tablet 5  . lovastatin (MEVACOR) 40 MG tablet Take 60 mg by mouth at bedtime.    . metolazone (ZAROXOLYN) 2.5 MG tablet Take 1 tablet (2.5 mg total) by mouth daily. 6 tablet 0  . metoprolol succinate (TOPROL-XL) 100 MG 24 hr tablet Take 1 tablet (100 mg total) by mouth daily. Take with or immediately following a meal. 30 tablet 11  . Multiple Vitamins-Minerals (MULTIVITAMIN PO) Take 1 tablet by mouth daily.    [name] omega-3 acid ethyl esters (LOVAZA) 1 G capsule Take 1 capsule (1 g total) by mouth 2 (two) times daily. 60 capsule 5  . omeprazole (PRILOSEC) 40 MG capsule Take 1 capsule (40 mg total) by mouth daily. 30 capsule 0  . oxycodone (ROXICODONE) 30 MG immediate release tablet Take 1 tablet by mouth 4 (four) times daily.    . REQUIP 0.5 MG tablet 2 each night after dinner 60 tablet 5  . sertraline (ZOLOFT) 100 MG tablet Take 100 mg by mouth daily.    [name] spironolactone (ALDACTONE) 25 MG tablet     . testosterone cypionate (DEPOTESTOTERONE CYPIONATE) 200 MG/ML injection Inject 200 mg into the muscle once a week. Once per week    . torsemide (DEMADEX) 20 MG tablet Take 2 tablets (40 mg  total) by mouth 2 (two) times daily. 120 tablet 11   No current facility-administered medications for this visit.     Allergies as of 08/04/2016 - Review Complete 08/04/2016  Allergen Reaction Noted  . Hydrocodone-acetaminophen Itching     Family History  Problem Relation Age of Onset  . Heart disease Father     H/o MI followed by SCD @ 57.  [name] Heart disease Sister     Died suddenly @ 84 - h/o CHF s/p AICD.  [name] Breast cancer Sister   . Bone cancer Mother   . Esophageal cancer Neg Hx   . Stomach cancer Neg Hx     Social History   Social History  . Marital status:  Divorced    Spouse name: N/A  . Number of children: 1  . Years of education: N/A   Occupational History  . UNEMPLOYEED Unemployed    on disability.    Social History Main Topics  . Smoking status: Current Some Day Smoker    Packs/day: 0.50    Years: 40.00    Types: Cigarettes  . Smokeless tobacco: Never Used  . Alcohol use 2.4 oz/week    4 Cans of beer per week     Comment: Has been drinking since age 79.  Has averaged 4 22 oz cans of beer daily until late 2015.  . Drug use: No     Comment: 03/30/2013 "[place]"  . Sexual activity: Not on file   Other Topics Concern  . Not on file   Social History Narrative   Lives in [place] by himself.  Does not routinely exercise.    Review of Systems:    Constitutional: No weight loss, fever, chills, weakness or fatigue Cardiovascular: No chest pain Respiratory: No SOB  Gastrointestinal: See HPI and otherwise negative Neurological: No headache Musculoskeletal: No new muscle pain Hematologic: No bleeding  Psychiatric: Positive for depression   Physical Exam:  Vital signs: BP (!) 150/80   Pulse 76   Ht 5\' 10"  (1.778 m)   Wt 229 lb (103.9 kg)   SpO2 95%   BMI 32.86 kg/m   Constitutional:   Pleasant African-American male appears to be in NAD, Well developed, Well nourished, alert and cooperative Respiratory: Respirations even and unlabored. Lungs  clear to auscultation bilaterally.   No wheezes, crackles, or rhonchi.  Cardiovascular: Normal S1, S2. No MRG. Regular rate and rhythm. No peripheral edema, cyanosis or pallor.  Gastrointestinal: overweight, Soft, Mild distension, mild ttp in the epigastrum and lower abdomen. No rebound or guarding. Decreased BS all 4 quads. No appreciable masses or hepatomegaly. Rectal:  Not performed.  Msk:  Symmetrical without gross deformities. Without edema, no deformity or joint abnormality.  Neurologic:  Alert and  oriented x4;  grossly normal neurologically.  Skin:   Dry and intact without significant lesions or rashes. Psychiatric: Demonstrates good judgement and reason without abnormal affect or behaviors.  Recent labs: 06/25/16-CMP: Glucose elevated at 169, carbon dioxide elevated at 35 and calcium elevated at 10.3, others normal 06/25/16-CBC: Hemoglobin elevated at 19.5, otherwise normal  Assessment: 1. Epigastric pain: Patient complains of epigastric cramping pain which occurs sometimes right after he swallows food, he feels like he "feels the food hit my stomach"; consider relation to known gastritis versus other 2. Lower abdominal pain: Patient describes intermittent lower abdominal pain, sometimes relieved by bowel movement, consider relation to IBS versus constipation likely due to chronic opioid use 3. Alcohol abuse: Patient is a chronic alcohol drinker and is down to 1 beer per day 4. Hepatitis C: This has never been treated but viral count was undetectable at last check, likely patient needs further evaluation for possible liver disease including abdominal imaging 5. GERD: Patient describes some breakthrough symptoms regardless of Omeprazole 40 mg daily 6. Bloating: Likely a combination of IBS and poor diet as well as constipation 7. Change in bowel habits: Patient notes that he has had a change in bowel habits describing what sounds like constipation with a normal bowel movement in the  morning but with "pieces of stool throughout the day and not feeling empty"; likely related to opioid use 8. History of adenomatous polyps: Last colonoscopy in 2013 with recommendations for repeat in 5  years  Plan: 1. At this time recommend EGD and colonoscopy for further evaluation. Discussed risks, benefits, limitations and alternatives and the patient agrees to proceed. These will be scheduled in the hospital due to patient's last EF of 20-25%. These were scheduled with Dr. Ardis Hughs. 2. Recommend the patient increase fiber in his diet to at least 25-35 g per day with use of fiber supplement such as Meatmucil, Citrucel or Benefiber. We did discuss this in detail. 3. Recommend the patient use MiraLAX once daily. Prescribed polyethylene glycol. He can decrease this to half a dose if this causes loose stools. 4. Patient to limit greasy/fast food 5. Recommend patient increase his Omeprazole 40 mg to twice daily to see if this helps with his epigastric pain/breakthrough reflux 6. In the future could consider abdominal imaging including a CT for further evaluation of his chronic hepatitis C and question of ascites 7. Patient to continue his Torsemide 20 mg, 2 tabs twice a day and spironolactone 25 mg daily. Patient has admitted to me today that he typically only takes medications once daily but is trying to ensure he takes some twice a day. This would likely help with any fluid accumulation. 8. Patient will need follow-up in the clinic for further discussion regarding his possible liver disease. This can be completed after his EGD and colonoscopy for acute symptoms. 9. Patient to follow in clinic per recommendations from Dr. Ardis Hughs after time of procedures.  Ellouise Newer, PA-C [facility] 08/04/2016, 10:07 AM  Cc: Donato Heinz, MD  Cc: Dr. Antonietta Jewel [facility]

## 2016-08-28 NOTE — Op Note (Signed)
[facility] Patient Name: Jerome Adams Procedure Date: 08/28/2016 MRN: [id number] Attending MD: Milus Banister , MD Date of Birth: [birth year] CSN: [id number] Age: 56 Admit Type: Outpatient Procedure:                Upper GI endoscopy Indications:              Generalized abdominal pain, Dyspepsia, bloating Providers:                Milus Banister, MD, Laverta Baltimore RN, RN,                            Elspeth Cho Tech., Technician, Dione Booze,                            CRNA Referring MD:              Medicines:                Monitored Anesthesia Care Complications:            No immediate complications. Estimated blood loss:                            None. Estimated Blood Loss:     Estimated blood loss: none. Procedure:                Pre-Anesthesia Assessment:                           - Prior to the procedure, a History and Physical                            was performed, and patient medications and                            allergies were reviewed. The patient's tolerance of                            previous anesthesia was also reviewed. The risks                            and benefits of the procedure and the sedation                            options and risks were discussed with the patient.                            All questions were answered, and informed consent                            was obtained. Prior Anticoagulants: The patient has                            taken no previous anticoagulant or antiplatelet                            agents. ASA Grade  Assessment: IV - A patient with                            severe systemic disease that is a constant threat                            to life. After reviewing the risks and benefits,                            the patient was deemed in satisfactory condition to                            undergo the procedure.                           After obtaining informed consent, the  endoscope was                            passed under direct vision. Throughout the                            procedure, the patient's blood pressure, pulse, and                            oxygen saturations were monitored continuously. The                            [id number]) scope was introduced through the                            mouth, and advanced to the second part of duodenum.                            The upper GI endoscopy was accomplished without                            difficulty. The patient tolerated the procedure                            well. Scope In: Scope Out: Findings:      The esophagus was normal.      There was a medium amount of retained fluid in the stomach, this was       suctioned clear. There was no evidence of anatomic gastric outlet       obstruction. Mild inflammation characterized by friability and       granularity was found in the entire examined stomach. Biopsies were       taken with a cold forceps for histology.      The examined duodenum was normal. Impression:               - Mild gastritis. Biopsied to check for H. pylori.                           - Endoscopic evidence of gastroparesis, this is  possibly from daily narcotic pain medicines is                            probably the cause of his bloating, dyspepsia. Moderate Sedation:      N/A- Per Anesthesia Care Recommendation:           - Patient has a contact number available for                            emergencies. The signs and symptoms of potential                            delayed complications were discussed with the                            patient. Return to normal activities tomorrow.                            Written discharge instructions were provided to the                            patient.                           - Resume previous diet.                           - Continue present medications.                           -  Await pathology results.                           - Please try to cut back on your narcotic pain                            medicines, these are likely the cause of your 'slow                            stomach' and bloating. Eating small, frequent meals                            rather than 1-2 larger meals per day can also help;                            you should eat 4-5 SMALL meals daily.                           - Please call my office to schedule follow up for                            your bloating, chronic Hepatitis C. Procedure Code(s):        --- Professional ---  [id number], Esophagogastroduodenoscopy, flexible,                            transoral; with biopsy, single or multiple Diagnosis Code(s):        --- Professional ---                           K29.70, Gastritis, unspecified, without bleeding                           R10.84, Generalized abdominal pain                           R10.13, Epigastric pain CPT copyright 2016 American Medical Association. All rights reserved. The codes documented in this report are preliminary and upon coder review may  be revised to meet current compliance requirements. Milus Banister, MD 08/28/2016 8:06:07 AM This report has been signed electronically. Number of Addenda: 0

## 2016-08-28 NOTE — Anesthesia Postprocedure Evaluation (Signed)
Anesthesia Post Note  Patient: Jerome Adams  Procedure(s) Performed: Procedure(s) (LRB): COLONOSCOPY WITH PROPOFOL (N/A) ESOPHAGOGASTRODUODENOSCOPY (EGD) WITH PROPOFOL (N/A)  Patient location during evaluation: PACU Anesthesia Type: MAC Level of consciousness: awake and alert Pain management: pain level controlled Vital Signs Assessment: post-procedure vital signs reviewed and stable Respiratory status: spontaneous breathing, nonlabored ventilation, respiratory function stable and patient connected to nasal cannula oxygen Cardiovascular status: stable and blood pressure returned to baseline Anesthetic complications: no       Last Vitals:  Vitals:   08/28/16 0653 08/28/16 0809  BP: 125/83 96/66  Pulse: 75 68  Resp: 13 13  Temp: 36.6 C 36.5 C    Last Pain:  Vitals:   08/28/16 0809  TempSrc: Oral                 Catalina Gravel

## 2016-08-28 NOTE — Transfer of Care (Signed)
Immediate Anesthesia Transfer of Care Note  Patient: Jerome Adams  Procedure(s) Performed: Procedure(s): COLONOSCOPY WITH PROPOFOL (N/A) ESOPHAGOGASTRODUODENOSCOPY (EGD) WITH PROPOFOL (N/A)  Patient Location: PACU and Endoscopy Unit  Anesthesia Type:MAC  Level of Consciousness: sedated and patient cooperative  Airway & Oxygen Therapy: Patient Spontanous Breathing and Patient connected to nasal cannula oxygen  Post-op Assessment: Report given to RN and Post -op Vital signs reviewed and stable  Post vital signs: Reviewed and stable  Last Vitals:  Vitals:   08/28/16 0653  BP: 125/83  Pulse: 75  Resp: 13  Temp: 36.6 C    Last Pain:  Vitals:   08/28/16 0653  TempSrc: Oral         Complications: No apparent anesthesia complications

## 2016-08-28 NOTE — Anesthesia Procedure Notes (Signed)
Procedure Name: MAC Date/Time: 08/28/2016 7:28 AM Performed by: Dione Booze Pre-anesthesia Checklist: Patient identified, Emergency Drugs available, Suction available and Patient being monitored Patient Re-evaluated:Patient Re-evaluated prior to inductionOxygen Delivery Method: Nasal cannula Placement Confirmation: positive ETCO2

## 2016-08-28 NOTE — Discharge Instructions (Signed)
YOU HAD AN ENDOSCOPIC PROCEDURE TODAY: Refer to the procedure report and other information in the discharge instructions given to you for any specific questions about what was found during the examination. If this information does not answer your questions, please call [place] office at [phone] to clarify.  ° °YOU SHOULD EXPECT: Some feelings of bloating in the abdomen. Passage of more gas than usual. Walking can help get rid of the air that was put into your GI tract during the procedure and reduce the bloating. If you had a lower endoscopy (such as a colonoscopy or flexible sigmoidoscopy) you may notice spotting of blood in your stool or on the toilet paper. Some abdominal soreness may be present for a day or two, also. ° °DIET: Your first meal following the procedure should be a light meal and then it is ok to progress to your normal diet. A half-sandwich or bowl of soup is an example of a good first meal. Heavy or fried foods are harder to digest and may make you feel nauseous or bloated. Drink plenty of fluids but you should avoid alcoholic beverages for 24 hours. If you had a esophageal dilation, please see attached instructions for diet.   ° °ACTIVITY: Your care partner should take you home directly after the procedure. You should plan to take it easy, moving slowly for the rest of the day. You can resume normal activity the day after the procedure however YOU SHOULD NOT DRIVE, use power tools, machinery or perform tasks that involve climbing or major physical exertion for 24 hours (because of the sedation medicines used during the test).  ° °SYMPTOMS TO REPORT IMMEDIATELY: °A gastroenterologist can be reached at any hour. Please call [phone]  for any of the following symptoms:  °Following lower endoscopy (colonoscopy, flexible sigmoidoscopy) °Excessive amounts of blood in the stool  °Significant tenderness, worsening of abdominal pains  °Swelling of the abdomen that is new, acute  °Fever of 100° or  higher  °Following upper endoscopy (EGD, EUS, ERCP, esophageal dilation) °Vomiting of blood or coffee ground material  °New, significant abdominal pain  °New, significant chest pain or pain under the shoulder blades  °Painful or persistently difficult swallowing  °New shortness of breath  °Black, tarry-looking or red, bloody stools ° °FOLLOW UP:  °If any biopsies were taken you will be contacted by phone or by letter within the next 1-3 weeks. Call [phone]  if you have not heard about the biopsies in 3 weeks.  °Please also call with any specific questions about appointments or follow up tests. ° °

## 2016-10-15 NOTE — Progress Notes (Signed)
[facility]     Cardiology Office Note   Date:  10/17/2016   ID:  Jerome Adams, DOB [birth year], MRN [id number]  PCP:  Antonietta Jewel, MD  Cardiologist:   Minus Breeding, MD   Chief Complaint  Patient presents with  . Cardiomyopathy    History of Present Illness: Jerome Adams is a 56 y.o. male who presents for follow up of dilated cardiomyopathy thought to be related probably to alcohol abuse.   He comes back today saying that he feels more bloated. He's having more shortness of breath and says he'll get short of breath at the top stairs (Class II).  He feels bloated in his abdomen although he doesn't have any lower extremity swelling and he never has. His weight is actually stable. He's only been taking his Demadex once daily when it was prescribed previously twice daily. He says he is compliant with his other medications. He's pretty good with salt. He's not drinking alcohol. He describes no chest pressure, neck or arm discomfort. He is occasionally feeling his heart skipping beats he's not describing any presyncope or syncope. He sleeps chronically on 3 pillows and occasionally feels like he has to cough at night. He's not describing any cough fevers or chills.    Past Medical History:  Diagnosis Date  . Alcohol abuse   . Allergy   . Arthritis    "joints" (03/30/2013)  . Chronic lower back pain   . Chronic pain syndrome   . Chronic pancreatitis ([place])   . CKD (chronic kidney disease), stage II   . Depression   . Drug abuse    a. Remote drug abuse (cocaine). UDS 07/2014 neg for cocaine.  . Essential hypertension   . GERD (gastroesophageal reflux disease)    takes  otc  . Hepatitis C 2010   a. never treated - undectable virus 12/2013.  [name] Hyperlipidemia   . Hypertriglyceridemia   . NICM (nonischemic cardiomyopathy) ([place])    a. 12/2007 Echo: EF 45-50%;  b. 07/2014 Echo: EF 20-25%, diff HK, mod dil LA, sev dil RA, mild TR, PASP 31 mmHg.  . PUD (peptic ulcer disease)   . Sleep  apnea    was tested here at [facility].Marland KitchenMarland KitchenMarland KitchenNever heard anymore about it (03/30/2013)  . Spinal cord stimulator status    has had for 1 yr--inserted by [facility]  . Tobacco abuse   . Transaminitis     Past Surgical History:  Procedure Laterality Date  . ANTERIOR CERVICAL DECOMP/DISCECTOMY FUSION  04/05/2012   Procedure: ANTERIOR CERVICAL DECOMPRESSION/DISCECTOMY FUSION 1 LEVEL;  Surgeon: Elaina Hoops, MD;  Location: [facility];  Service: Neurosurgery;  Laterality: N/A;  Cervical five-six anterior cervical decompression and fusion  . BACK SURGERY    . COLONOSCOPY WITH PROPOFOL N/A 08/28/2016   Procedure: COLONOSCOPY WITH PROPOFOL;  Surgeon: Milus Banister, MD;  Location: [facility] ENDOSCOPY;  Service: Endoscopy;  Laterality: N/A;  . ESOPHAGOGASTRODUODENOSCOPY (EGD) WITH PROPOFOL N/A 08/28/2016   Procedure: ESOPHAGOGASTRODUODENOSCOPY (EGD) WITH PROPOFOL;  Surgeon: Milus Banister, MD;  Location: [facility] ENDOSCOPY;  Service: Endoscopy;  Laterality: N/A;  . FOOT SURGERY Right 2012?   "took piece off that was hurting me" (03/30/2013)  . HARDWARE REMOVAL     "took screw out of my back; it had damaged my nerve" (03/30/2013)  . INCISION AND DRAINAGE ABSCESS Left    "lower jaw, tooth abscess" (03/30/2013)  . LEFT HEART CATHETERIZATION WITH CORONARY ANGIOGRAM N/A 08/03/2014   Procedure: LEFT HEART CATHETERIZATION WITH CORONARY ANGIOGRAM;  Surgeon: Blane Ohara, MD;  Location: [facility] CATH LAB;  Service: Cardiovascular;  Laterality: N/A;  . POSTERIOR LUMBAR FUSION     "L4-5" (03/31/2103)  . SPINAL CORD STIMULATOR IMPLANT  11/14/2012  . WRIST SURGERY Right 1980's   repair of tendons and nerves     Current Outpatient Prescriptions  Medication Sig Dispense Refill  . albuterol (PROVENTIL HFA;VENTOLIN HFA) 108 (90 BASE) MCG/ACT inhaler Inhale 2-4 puffs into the lungs every 6 (six) hours as needed for wheezing or shortness of breath. 1 Inhaler 0  . aspirin EC 81 MG EC tablet Take 1 tablet (81 mg total) by mouth daily. 30 tablet 0    . atorvastatin (LIPITOR) 80 MG tablet Take 80 mg by mouth daily.    [name] gemfibrozil (LOPID) 600 MG tablet Take 1 tablet (600 mg total) by mouth 2 (two) times daily before a meal. 60 tablet 5  . losartan (COZAAR) 100 MG tablet Take 100 mg by mouth daily.    . metoprolol succinate (TOPROL-XL) 100 MG 24 hr tablet Take 1 tablet (100 mg total) by mouth daily. Take with or immediately following a meal. 30 tablet 11  . Multiple Vitamins-Minerals (MULTIVITAMIN PO) Take 1 tablet by mouth daily.    . Na Sulfate-K Sulfate-Mg Sulf 17.5-3.13-1.6 GM/180ML SOLN Take 1 kit by mouth as directed. 354 mL 0  . omega-3 acid ethyl esters (LOVAZA) 1 G capsule Take 1 capsule (1 g total) by mouth 2 (two) times daily. 60 capsule 5  . omeprazole (PRILOSEC) 20 MG capsule Take 20 mg by mouth daily.  3  . oxycodone (ROXICODONE) 30 MG immediate release tablet Take 1 tablet by mouth 4 (four) times daily.    . phentermine 37.5 MG capsule Take 37.5 mg by mouth daily.    . polyethylene glycol powder (GLYCOLAX/MIRALAX) powder Take 17 g by mouth daily. (Patient taking differently: Take 17 g by mouth every other day. ) 255 g 3  . Psyllium (METAMUCIL PO) Take 2 scoop by mouth daily.    . sertraline (ZOLOFT) 100 MG tablet Take 100 mg by mouth daily.    [name] spironolactone (ALDACTONE) 25 MG tablet Take 12.5 mg by mouth daily.     [name] testosterone cypionate (DEPOTESTOTERONE CYPIONATE) 200 MG/ML injection Inject 100 mg into the muscle once a week. Saturdays.    [name] torsemide (DEMADEX) 20 MG tablet Take 2 tablets (40 mg total) by mouth 2 (two) times daily. 120 tablet 11   No current facility-administered medications for this visit.     Allergies:   Hydrocodone-acetaminophen    ROS:  Please see the history of present illness.   Otherwise, review of systems are positive for none.   All other systems are reviewed and negative.    PHYSICAL EXAM: VS:  BP 122/79   Pulse (!) 58   Ht _0  (1.778 m)   Wt 224 lb 3.2 oz (101.7 kg)   BMI 32.17  kg/m  , BMI Body mass index is 32.17 kg/m. GENERAL:  Well appearing NECK:  No jugular venous distention, waveform within normal limits, carotid upstroke brisk and symmetric, no bruits, no thyromegaly LUNGS:  Clear to auscultation bilaterally BACK:  No CVA tenderness CHEST:  Unremarkable HEART:  PMI not displaced or sustained,S1 and S2 within normal limits, no S3, no S4, no clicks, no rubs, no murmurs ABD:  Mild abdominal distention, positive bowel sounds normal in frequency in pitch, no bruits, no rebound, no guarding, no midline pulsatile mass, no hepatomegaly, positive splenomegaly, mild  abdominal distention.  EXT:  2 plus pulses throughout,  edema, no cyanosis no clubbing    EKG:  EKG is  ordered today. Normal sinus rhythm, rate 63, axis rightward, poor anterior R wave progression, inferolateral T wave inversion unchanged from previous. PVCs.     Recent Labs: No results found for requested labs within last 8760 hours.     Wt Readings from Last 3 Encounters:  10/16/16 224 lb 3.2 oz (101.7 kg)  08/28/16 229 lb (103.9 kg)  08/04/16 229 lb (103.9 kg)      Other studies Reviewed: Additional studies/ records that were reviewed today include:  None.   ASSESSMENT AND PLAN:  Chronic systolic CHF -    Ongoing check a basic metabolic profile today. He start taking his Lasix twice daily. He is cautioned about salt and fluid.  I'm going to get another echocardiogram as it's been a couple of years. He needs to come back next week for another basic metabolic profile as he has had some renal insufficiency before. I'd like him to see our APP at that time for further evaluation. Of note he will ultimately need to be referred for an ICD if his ejection fraction doesn't improve.  Essential HTN -    This is being managed in the context of treating his CHF.   CKD stage II -     He has his labs followed closely by [facility], MD.  We will follow this closely as well as above.  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  He will continue with risk reduction.   Alcohol abuse -  He says that he is not drinking.    Tobacco abuse - He has been given a prescription for Chantix.    He did not take this.  He says he has the pills and will start.   Current medicines are reviewed at length with the patient today.  The patient does not have concerns regarding medicines.  The following changes have been made:  As above.   Labs/ tests ordered today include:   Echo, BMET   Disposition:   FU with APP next week.    Signed, Minus Breeding, MD  10/17/2016 1:47 PM    [facility]

## 2016-10-16 ENCOUNTER — Ambulatory Visit (INDEPENDENT_AMBULATORY_CARE_PROVIDER_SITE_OTHER): Payer: Medicare Other | Admitting: Cardiology

## 2016-10-16 VITALS — BP 122/79 | HR 58 | Ht 70.0 in | Wt 224.2 lb

## 2016-10-16 DIAGNOSIS — Z79899 Other long term (current) drug therapy: Secondary | ICD-10-CM

## 2016-10-16 DIAGNOSIS — I5022 Chronic systolic (congestive) heart failure: Secondary | ICD-10-CM

## 2016-10-16 DIAGNOSIS — I428 Other cardiomyopathies: Secondary | ICD-10-CM | POA: Diagnosis not present

## 2016-10-16 MED ORDER — TORSEMIDE 20 MG PO TABS: 40.0000 mg | ORAL_TABLET | Freq: Two times a day (BID) | ORAL | 11 refills | Status: DC

## 2016-10-16 NOTE — Patient Instructions (Signed)
Medication Instructions:  Take Torsemide 40 mg(2 Tablets) twice a day  Labwork: BMP and TSH today BMP in 1 Week  Testing/Procedures: Your physician has requested that you have an echocardiogram. Echocardiography is a painless test that uses sound waves to create images of your heart. It provides your doctor with information about the size and shape of your heart and how well your heart's chambers and valves are working. This procedure takes approximately one hour. There are no restrictions for this procedure.   Follow-Up: Your physician recommends that you schedule a follow-up appointment in: 1 Week with Kerin Ransom   Any Other Special Instructions Will Be Listed Below (If Applicable).   If you need a refill on your cardiac medications before your next appointment, please call your pharmacy.

## 2016-10-17 ENCOUNTER — Ambulatory Visit: Payer: Medicare Other | Admitting: Cardiology

## 2016-10-17 ENCOUNTER — Encounter: Payer: Self-pay | Admitting: Cardiology

## 2016-10-17 LAB — BASIC METABOLIC PANEL
BUN: 13 mg/dL (ref 7–25)
CO2: 31 mmol/L (ref 20–31)
Calcium: 9.2 mg/dL (ref 8.6–10.3)
Chloride: 103 mmol/L (ref 98–110)
Creat: 1.3 mg/dL (ref 0.70–1.33)
Glucose, Bld: 144 mg/dL — ABNORMAL HIGH (ref 65–99)
Potassium: 4.5 mmol/L (ref 3.5–5.3)
Sodium: 140 mmol/L (ref 135–146)

## 2016-10-17 LAB — TSH: TSH: 0.49 mIU/L (ref 0.40–4.50)

## 2016-10-27 ENCOUNTER — Encounter: Payer: Self-pay | Admitting: Cardiology

## 2016-10-27 ENCOUNTER — Ambulatory Visit (INDEPENDENT_AMBULATORY_CARE_PROVIDER_SITE_OTHER): Payer: Medicare Other | Admitting: Cardiology

## 2016-10-27 VITALS — BP 135/87 | HR 79 | Ht 70.0 in | Wt 223.0 lb

## 2016-10-27 DIAGNOSIS — F1910 Other psychoactive substance abuse, uncomplicated: Secondary | ICD-10-CM | POA: Diagnosis not present

## 2016-10-27 DIAGNOSIS — N182 Chronic kidney disease, stage 2 (mild): Secondary | ICD-10-CM | POA: Diagnosis not present

## 2016-10-27 DIAGNOSIS — B182 Chronic viral hepatitis C: Secondary | ICD-10-CM

## 2016-10-27 DIAGNOSIS — E785 Hyperlipidemia, unspecified: Secondary | ICD-10-CM | POA: Diagnosis not present

## 2016-10-27 DIAGNOSIS — I10 Essential (primary) hypertension: Secondary | ICD-10-CM | POA: Diagnosis not present

## 2016-10-27 DIAGNOSIS — I428 Other cardiomyopathies: Secondary | ICD-10-CM | POA: Diagnosis not present

## 2016-10-27 DIAGNOSIS — I5021 Acute systolic (congestive) heart failure: Secondary | ICD-10-CM | POA: Diagnosis not present

## 2016-10-27 DIAGNOSIS — I2510 Atherosclerotic heart disease of native coronary artery without angina pectoris: Secondary | ICD-10-CM | POA: Diagnosis not present

## 2016-10-27 DIAGNOSIS — R109 Unspecified abdominal pain: Secondary | ICD-10-CM | POA: Diagnosis not present

## 2016-10-27 NOTE — Progress Notes (Signed)
10/27/2016 Jerome Adams   12-21-1960  [id number]  Primary Physician Antonietta Jewel, MD Primary Cardiologist: Dr Percival Spanish  HPI:  56 y/o AA male, originally from [place], moved her in the 58's. He has a history of polysubstance abuse, Hepatitis C, CRI-2, and NICM. His substance abuse has been stable. His last EF assessment was by echo Jan 2016- EF 20-25%. Cath showed non obstructive CAD. Though his substance issues stabilized he did have problems with dietary and medications compliance. The pt has been seen by [facility] for renal insufficiency. They feel he has CRI-2 and recommended periodic BMPs as well as avoiding NSAIDs.  Dr Percival Spanish saw him 10/16/16 and adjusted the pt's medications. He had complained of abdominal bloating (his volume overload symptoms). The pt had not been taking his Demadex as instructed. Dr Percival Spanish increased it to 40 mg BID. The pt is her now for follow up.  The pt was seen recently at [facility] for renal insufficiency his SCr had risen to 1.5. They feel he has CRI-2 and recommended periodic BMPs as well as avoiding NSAIDs.   Since the pt saw dr Percival Spanish he has done well. He has occasional "hard beats" but no tachycardia. He never has LE edema- usually abdominal swelling. He had his BMP on increased Demadex drawn today. He has an echo scheduled for Monday. If his EF remains <35% Dr Percival Spanish suggests he be referred for an ICD.    Current Outpatient Prescriptions  Medication Sig Dispense Refill  . albuterol (PROVENTIL HFA;VENTOLIN HFA) 108 (90 BASE) MCG/ACT inhaler Inhale 2-4 puffs into the lungs every 6 (six) hours as needed for wheezing or shortness of breath. 1 Inhaler 0  . aspirin EC 81 MG EC tablet Take 1 tablet (81 mg total) by mouth daily. 30 tablet 0  . atorvastatin (LIPITOR) 80 MG tablet Take 80 mg by mouth daily.    [name] gemfibrozil (LOPID) 600 MG tablet Take 1 tablet (600 mg total) by mouth 2 (two) times daily before a meal. 60 tablet 5  . losartan (COZAAR)  100 MG tablet Take 100 mg by mouth daily.    . metoprolol succinate (TOPROL-XL) 100 MG 24 hr tablet Take 1 tablet (100 mg total) by mouth daily. Take with or immediately following a meal. 30 tablet 11  . Multiple Vitamins-Minerals (MULTIVITAMIN PO) Take 1 tablet by mouth daily.    . Na Sulfate-K Sulfate-Mg Sulf 17.5-3.13-1.6 GM/180ML SOLN Take 1 kit by mouth as directed. 354 mL 0  . omega-3 acid ethyl esters (LOVAZA) 1 G capsule Take 1 capsule (1 g total) by mouth 2 (two) times daily. 60 capsule 5  . omeprazole (PRILOSEC) 20 MG capsule Take 20 mg by mouth daily.  3  . oxycodone (ROXICODONE) 30 MG immediate release tablet Take 1 tablet by mouth 4 (four) times daily.    . phentermine 37.5 MG capsule Take 37.5 mg by mouth daily.    . polyethylene glycol powder (GLYCOLAX/MIRALAX) powder Take 17 g by mouth daily. 255 g 3  . Psyllium (METAMUCIL PO) Take 2 scoop by mouth daily.    . sertraline (ZOLOFT) 100 MG tablet Take 100 mg by mouth daily.    [name] spironolactone (ALDACTONE) 25 MG tablet Take 12.5 mg by mouth daily.     [name] testosterone cypionate (DEPOTESTOTERONE CYPIONATE) 200 MG/ML injection Inject 100 mg into the muscle once a week. Saturdays.    [name] torsemide (DEMADEX) 20 MG tablet Take 2 tablets (40 mg total) by mouth 2 (two) times daily.  120 tablet 11   No current facility-administered medications for this visit.     Allergies  Allergen Reactions  . Hydrocodone-Acetaminophen Itching    Past Medical History:  Diagnosis Date  . Alcohol abuse   . Allergy   . Arthritis    "joints" (03/30/2013)  . Chronic lower back pain   . Chronic pain syndrome   . Chronic pancreatitis ([place])   . CKD (chronic kidney disease), stage II   . Depression   . Drug abuse    a. Remote drug abuse (cocaine). UDS 07/2014 neg for cocaine.  . Essential hypertension   . GERD (gastroesophageal reflux disease)    takes  otc  . Hepatitis C 2010   a. never treated - undectable virus 12/2013.  [name] Hyperlipidemia   .  Hypertriglyceridemia   . NICM (nonischemic cardiomyopathy) ([place])    a. 12/2007 Echo: EF 45-50%;  b. 07/2014 Echo: EF 20-25%, diff HK, mod dil LA, sev dil RA, mild TR, PASP 31 mmHg.  . PUD (peptic ulcer disease)   . Sleep apnea    was tested here at [facility].Marland KitchenMarland KitchenMarland KitchenNever heard anymore about it (03/30/2013)  . Spinal cord stimulator status    has had for 1 yr--inserted by [facility]  . Tobacco abuse   . Transaminitis     Social History   Social History  . Marital status: Divorced    Spouse name: N/A  . Number of children: 1  . Years of education: N/A   Occupational History  . UNEMPLOYEED Unemployed    on disability.    Social History Main Topics  . Smoking status: Current Some Day Smoker    Packs/day: 0.50    Years: 40.00    Types: Cigarettes  . Smokeless tobacco: Never Used  . Alcohol use 2.4 oz/week    4 Cans of beer per week     Comment: Has been drinking since age 14.  Has averaged 4 22 oz cans of beer daily until late 2015.  . Drug use: No     Comment: 03/30/2013 "[place]"  . Sexual activity: Not on file   Other Topics Concern  . Not on file   Social History Narrative   Lives in [place] by himself.  Does not routinely exercise.     Family History  Problem Relation Age of Onset  . Heart disease Father     H/o MI followed by SCD @ 4.  [name] Heart disease Sister     Died suddenly @ 75 - h/o CHF s/p AICD.  [name] Breast cancer Sister   . Bone cancer Mother   . Esophageal cancer Neg Hx   . Stomach cancer Neg Hx      Review of Systems: General: negative for chills, fever, night sweats or weight changes.  Cardiovascular: negative for chest pain, dyspnea on exertion, edema, orthopnea, palpitations, paroxysmal nocturnal dyspnea or shortness of breath Dermatological: negative for rash Respiratory: negative for cough or wheezing Urologic: negative for hematuria Abdominal: negative for nausea, vomiting, diarrhea, bright red blood per rectum, melena, or hematemesis Neurologic:  negative for visual changes, syncope, or dizziness All other systems reviewed and are otherwise negative except as noted above.    Blood pressure 135/87, pulse 79, height '5\' 10"'  (1.778 m), weight 223 lb (101.2 kg).  General appearance: alert, cooperative and no distress Neck: no carotid bruit and no JVD Lungs: clear to auscultation bilaterally Heart: regular rate and rhythm Extremities: extremities normal, atraumatic, no cyanosis or edema Neurologic: Grossly  normal    ASSESSMENT AND PLAN:   Acute systolic CHF (congestive heart failure) (HCC) Pt's Demadex increased to 40 mg BID 10/16/16- he feels better. Less abdominal bloating. He does have DOE still  NICM (nonischemic cardiomyopathy) a. 12/2007 Echo: EF 45-50%; b. 07/2014 Echo: EF 20-25%, diff HK, mod dil LA, sev dil RA, mild TR, PASP 31 mmHg. Repeat Echo next week ordered  Mild coronary artery disease Cath Jan 2016-30% mLAD  Essential hypertension Controlled  CKD (chronic kidney disease), stage II Seen July 2017 at [facility] BMP done today-results pending  Abdominal pain of unknown etiology Endoscopy and colonoscopy done by Dr Ardis Hughs Feb 2018. One polyp resected other essentially normal.   Drug abuse a. Remote drug abuse (cocaine). UDS 07/2014 neg for cocaine.  HCV (hepatitis C virus) Cleared infection. Neg RNA quant   PLAN  Follow up BMP and echo- if his EF is stable->35% and BMP stable-last SCr 1.3, then f/u in 3 months. If his EF is depressed refer for ICD evaluation.   Kerin Ransom PA-C 10/27/2016 3:33 PM

## 2016-10-27 NOTE — Assessment & Plan Note (Signed)
Cleared infection. Neg RNA quant

## 2016-10-27 NOTE — Assessment & Plan Note (Signed)
Endoscopy and colonoscopy done by Dr Ardis Hughs Feb 2018. One polyp resected other essentially normal.

## 2016-10-27 NOTE — Assessment & Plan Note (Signed)
Seen July 2017 at [place] BMP done today-results pending

## 2016-10-27 NOTE — Assessment & Plan Note (Signed)
Cath Jan 2016-30% mLAD 

## 2016-10-27 NOTE — Assessment & Plan Note (Signed)
a. Remote drug abuse (cocaine). UDS 07/2014 neg for cocaine.

## 2016-10-27 NOTE — Assessment & Plan Note (Signed)
a. 12/2007 Echo: EF 45-50%; b. 07/2014 Echo: EF 20-25%, diff HK, mod dil LA, sev dil RA, mild TR, PASP 31 mmHg. Repeat Echo next week ordered

## 2016-10-27 NOTE — Assessment & Plan Note (Signed)
Controlled.  

## 2016-10-27 NOTE — Assessment & Plan Note (Signed)
Pt's Demadex increased to 40 mg BID 10/16/16- he feels better. Less abdominal bloating. He does have DOE still

## 2016-10-27 NOTE — Patient Instructions (Signed)
Medication Instructions:  Continue current medications  Labwork: None Ordered  Testing/Procedures: None ordered  Follow-Up: Your physician wants you to follow-up in: 3 Month with Kerin Ransom. You will receive a reminder letter in the mail two months in advance. If you don't receive a letter, please call our office to schedule the follow-up appointment.   Any Other Special Instructions Will Be Listed Below (If Applicable).     If you need a refill on your cardiac medications before your next appointment, please call your pharmacy.

## 2016-10-28 LAB — BASIC METABOLIC PANEL
BUN: 9 mg/dL (ref 7–25)
CO2: 26 mmol/L (ref 20–31)
Calcium: 9.5 mg/dL (ref 8.6–10.3)
Chloride: 100 mmol/L (ref 98–110)
Creat: 1.33 mg/dL (ref 0.70–1.33)
Glucose, Bld: 173 mg/dL — ABNORMAL HIGH (ref 65–99)
Potassium: 4 mmol/L (ref 3.5–5.3)
Sodium: 139 mmol/L (ref 135–146)

## 2016-10-31 ENCOUNTER — Ambulatory Visit (HOSPITAL_COMMUNITY): Payer: Medicare Other | Attending: Internal Medicine

## 2016-10-31 ENCOUNTER — Other Ambulatory Visit: Payer: Self-pay

## 2016-10-31 DIAGNOSIS — I428 Other cardiomyopathies: Secondary | ICD-10-CM | POA: Insufficient documentation

## 2016-10-31 DIAGNOSIS — Z720 Tobacco use: Secondary | ICD-10-CM | POA: Diagnosis not present

## 2016-10-31 DIAGNOSIS — I429 Cardiomyopathy, unspecified: Secondary | ICD-10-CM | POA: Diagnosis present

## 2016-10-31 DIAGNOSIS — N189 Chronic kidney disease, unspecified: Secondary | ICD-10-CM | POA: Insufficient documentation

## 2016-10-31 DIAGNOSIS — I77810 Thoracic aortic ectasia: Secondary | ICD-10-CM | POA: Insufficient documentation

## 2016-10-31 DIAGNOSIS — E785 Hyperlipidemia, unspecified: Secondary | ICD-10-CM | POA: Insufficient documentation

## 2016-10-31 DIAGNOSIS — B1920 Unspecified viral hepatitis C without hepatic coma: Secondary | ICD-10-CM | POA: Insufficient documentation

## 2016-10-31 DIAGNOSIS — I517 Cardiomegaly: Secondary | ICD-10-CM | POA: Diagnosis not present

## 2016-10-31 DIAGNOSIS — I358 Other nonrheumatic aortic valve disorders: Secondary | ICD-10-CM | POA: Diagnosis not present

## 2016-10-31 DIAGNOSIS — K861 Other chronic pancreatitis: Secondary | ICD-10-CM | POA: Diagnosis not present

## 2017-01-10 ENCOUNTER — Other Ambulatory Visit: Payer: Self-pay | Admitting: Cardiology

## 2017-01-30 ENCOUNTER — Other Ambulatory Visit: Payer: Self-pay | Admitting: Cardiology

## 2017-02-03 ENCOUNTER — Ambulatory Visit (HOSPITAL_COMMUNITY)
Admission: RE | Admit: 2017-02-03 | Discharge: 2017-02-03 | Disposition: A | Discharge status: Home / Self Care | Payer: Medicare Other | Source: Ambulatory Visit | Attending: Internal Medicine | Admitting: Internal Medicine

## 2017-02-03 ENCOUNTER — Other Ambulatory Visit (HOSPITAL_COMMUNITY): Payer: Self-pay | Admitting: Internal Medicine

## 2017-02-03 DIAGNOSIS — R0602 Shortness of breath: Secondary | ICD-10-CM

## 2017-02-03 DIAGNOSIS — I517 Cardiomegaly: Secondary | ICD-10-CM | POA: Insufficient documentation

## 2017-05-26 ENCOUNTER — Other Ambulatory Visit: Payer: Self-pay | Admitting: Cardiology

## 2017-05-27 ENCOUNTER — Other Ambulatory Visit: Payer: Self-pay

## 2017-05-27 MED ORDER — SPIRONOLACTONE 25 MG PO TABS: 12.5000 mg | ORAL_TABLET | Freq: Every day | ORAL | 0 refills | Status: DC

## 2017-06-05 ENCOUNTER — Ambulatory Visit (INDEPENDENT_AMBULATORY_CARE_PROVIDER_SITE_OTHER): Payer: Medicare Other | Admitting: Cardiology

## 2017-06-05 ENCOUNTER — Encounter: Payer: Self-pay | Admitting: Cardiology

## 2017-06-05 VITALS — BP 128/83 | HR 81 | Ht 70.0 in | Wt 222.4 lb

## 2017-06-05 DIAGNOSIS — I428 Other cardiomyopathies: Secondary | ICD-10-CM

## 2017-06-05 DIAGNOSIS — F1910 Other psychoactive substance abuse, uncomplicated: Secondary | ICD-10-CM

## 2017-06-05 DIAGNOSIS — E785 Hyperlipidemia, unspecified: Secondary | ICD-10-CM

## 2017-06-05 DIAGNOSIS — I10 Essential (primary) hypertension: Secondary | ICD-10-CM | POA: Diagnosis not present

## 2017-06-05 DIAGNOSIS — N182 Chronic kidney disease, stage 2 (mild): Secondary | ICD-10-CM | POA: Diagnosis not present

## 2017-06-05 DIAGNOSIS — B182 Chronic viral hepatitis C: Secondary | ICD-10-CM | POA: Diagnosis not present

## 2017-06-05 DIAGNOSIS — Z9114 Patient's other noncompliance with medication regimen: Secondary | ICD-10-CM | POA: Diagnosis not present

## 2017-06-05 DIAGNOSIS — I2510 Atherosclerotic heart disease of native coronary artery without angina pectoris: Secondary | ICD-10-CM | POA: Diagnosis not present

## 2017-06-05 MED ORDER — SACUBITRIL-VALSARTAN 49-51 MG PO TABS
1.0000 | ORAL_TABLET | Freq: Two times a day (BID) | ORAL | 3 refills | Status: DC
Start: 2017-06-05 — End: 2017-07-22

## 2017-06-05 NOTE — Progress Notes (Signed)
06/05/2017 Jerome Adams   01-Sep-1960  [id number]  Primary Physician Antonietta Jewel, MD Primary Cardiologist: Dr Percival Spanish  HPI:  56 y/o AA male, originally from [place], moved her in the 23's. He has a history of polysubstance abuse, Hepatitis C, CRI-2, and NICM. His substance abuse has been stable. His EF by echo Jan 2016 was EF 20-25%. Cath then showed non obstructive CAD. Though his substance issues stabilized he has had problems with dietary and medication compliance. The pt has been seen by [facility] for renal insufficiency. They feel he has CRI-2 and recommended periodic BMPs as well as avoiding NSAIDs.He was last seen in April 2018. An echo then showed his EF was 35-40% ( we were going to refer him for ICD if his EF was still less than 35%). He is in the office today for follow up. He has been doing pretty well, he is not drinking alcohol, no drug use. He still smokes a pack a day. He says he has been watching his diet and taking his medications as prescribed. He does admit to occasional dyspnea but denies orthopnea.      Current Outpatient Medications  Medication Sig Dispense Refill  . albuterol (PROVENTIL HFA;VENTOLIN HFA) 108 (90 BASE) MCG/ACT inhaler Inhale 2-4 puffs into the lungs every 6 (six) hours as needed for wheezing or shortness of breath. 1 Inhaler 0  . aspirin EC 81 MG EC tablet Take 1 tablet (81 mg total) by mouth daily. 30 tablet 0  . atorvastatin (LIPITOR) 80 MG tablet Take 80 mg by mouth daily.    [name] gemfibrozil (LOPID) 600 MG tablet Take 1 tablet (600 mg total) by mouth 2 (two) times daily before a meal. 60 tablet 5  . losartan (COZAAR) 100 MG tablet Take 100 mg by mouth daily.    . metoprolol succinate (TOPROL-XL) 100 MG 24 hr tablet TAKE 1 TABLET (100 MG TOTAL) BY MOUTH DAILY. TAKE WITH OR IMMEDIATELY FOLLOWING A MEAL. 30 tablet 6  . Multiple Vitamins-Minerals (MULTIVITAMIN PO) Take 1 tablet by mouth daily.    . Na Sulfate-K Sulfate-Mg Sulf 17.5-3.13-1.6  GM/180ML SOLN Take 1 kit by mouth as directed. 354 mL 0  . omega-3 acid ethyl esters (LOVAZA) 1 G capsule Take 1 capsule (1 g total) by mouth 2 (two) times daily. 60 capsule 5  . omeprazole (PRILOSEC) 20 MG capsule Take 20 mg by mouth daily.  3  . oxycodone (ROXICODONE) 30 MG immediate release tablet Take 1 tablet by mouth 4 (four) times daily.    . phentermine 37.5 MG capsule Take 37.5 mg by mouth daily.    . polyethylene glycol powder (GLYCOLAX/MIRALAX) powder Take 17 g by mouth daily. 255 g 3  . Psyllium (METAMUCIL PO) Take 2 scoop by mouth daily.    . sertraline (ZOLOFT) 100 MG tablet Take 100 mg by mouth daily.    [name] spironolactone (ALDACTONE) 25 MG tablet Take 0.5 tablets (12.5 mg total) by mouth daily. 15 tablet 0  . testosterone cypionate (DEPOTESTOTERONE CYPIONATE) 200 MG/ML injection Inject 100 mg into the muscle once a week. Saturdays.    [name] torsemide (DEMADEX) 20 MG tablet Take 2 tablets (40 mg total) by mouth 2 (two) times daily. 120 tablet 11   No current facility-administered medications for this visit.     Allergies  Allergen Reactions  . Hydrocodone-Acetaminophen Itching    Past Medical History:  Diagnosis Date  . Alcohol abuse   . Allergy   . Arthritis    "  joints" (03/30/2013)  . Chronic lower back pain   . Chronic pain syndrome   . Chronic pancreatitis ([place])   . CKD (chronic kidney disease), stage II   . Depression   . Drug abuse ([place])    a. Remote drug abuse (cocaine). UDS 07/2014 neg for cocaine.  . Essential hypertension   . GERD (gastroesophageal reflux disease)    takes  otc  . Hepatitis C 2010   a. never treated - undectable virus 12/2013.  [name] Hyperlipidemia   . Hypertriglyceridemia   . NICM (nonischemic cardiomyopathy) ([place])    a. 12/2007 Echo: EF 45-50%;  b. 07/2014 Echo: EF 20-25%, diff HK, mod dil LA, sev dil RA, mild TR, PASP 31 mmHg.  . PUD (peptic ulcer disease)   . Sleep apnea    was tested here at [facility].Marland KitchenMarland KitchenMarland KitchenNever heard anymore about it (03/30/2013)  .  Spinal cord stimulator status    has had for 1 yr--inserted by [facility]  . Tobacco abuse   . Transaminitis     Social History   Socioeconomic History  . Marital status: Divorced    Spouse name: Not on file  . Number of children: 1  . Years of education: Not on file  . Highest education level: Not on file  Social Needs  . Financial resource strain: Not on file  . Food insecurity - worry: Not on file  . Food insecurity - inability: Not on file  . Transportation needs - medical: Not on file  . Transportation needs - non-medical: Not on file  Occupational History  . Occupation: UNEMPLOYEED    Employer: UNEMPLOYED    Comment: on disability.   Tobacco Use  . Smoking status: Current Some Day Smoker    Packs/day: 0.50    Years: 40.00    Pack years: 20.00    Types: Cigarettes  . Smokeless tobacco: Never Used  Substance and Sexual Activity  . Alcohol use: Yes    Alcohol/week: 2.4 oz    Types: 4 Cans of beer per week    Comment: Has been drinking since age 21.  Has averaged 4 22 oz cans of beer daily until late 2015.  . Drug use: No    Comment: 03/30/2013 "[place]"  . Sexual activity: Not on file  Other Topics Concern  . Not on file  Social History Narrative   Lives in [place] by himself.  Does not routinely exercise.     Family History  Problem Relation Age of Onset  . Heart disease Father        H/o MI followed by SCD @ 18.  [name] Heart disease Sister        Died suddenly @ 37 - h/o CHF s/p AICD.  [name] Breast cancer Sister   . Bone cancer Mother   . Esophageal cancer Neg Hx   . Stomach cancer Neg Hx      Review of Systems: General: negative for chills, fever, night sweats or weight changes.  Cardiovascular: negative for chest pain, dyspnea on exertion, edema, orthopnea, palpitations, paroxysmal nocturnal dyspnea or shortness of breath Dermatological: negative for rash Respiratory: negative for cough or wheezing Urologic: negative for hematuria Abdominal: negative  for nausea, vomiting, diarrhea, bright red blood per rectum, melena, or hematemesis Neurologic: negative for visual changes, syncope, or dizziness All other systems reviewed and are otherwise negative except as noted above.    Blood pressure 128/83, pulse 81, height '5\' 10"'  (1.778 m), weight 222 lb 6.4 oz (100.9 kg).  General appearance: alert, cooperative, no distress and mildly obese Neck: no carotid bruit and no JVD Lungs: clear to auscultation bilaterally Heart: regular rate and rhythm Abdomen: soft, non-tender; bowel sounds normal; no masses,  no organomegaly Extremities: extremities normal, atraumatic, no cyanosis or edema Skin: Skin color, texture, turgor normal. No rashes or lesions Neurologic: Grossly normal  EKG NSR-68. TWI lead 1, V5-V6  ASSESSMENT AND PLAN:    Chronic systolic CHF (congestive heart failure) ([place])  He does have DOE occasionally but no CHF on exam  NICM (nonischemic cardiomyopathy) a. 12/2007 Echo: EF 45-50%; b. 07/2014 Echo: EF 20-25% C. 10/2016 Echo: EF 35-40%  Mild coronary artery disease Cath Jan 2016-30% mLAD  Essential hypertension Controlled  CKD (chronic kidney disease), stage II Seen July 2017 at [place] done today-results pending  Drug abuse a. Remote drug abuse (cocaine). UDS 07/2014 neg for cocaine.  HCV (hepatitis C virus) Cleared infection. Neg RNA quant    PLAN  Check labs and start Entresto 49/51 and stop Losartan pending labs. F/U 4 weeks after this.   Kerin Ransom PA-C 06/05/2017 2:51 PM

## 2017-06-05 NOTE — Patient Instructions (Signed)
Medication Instructions:  START ENTRESTO 49/51MG  AND STOP LOSARTAN IF LABWORK IS OKAY-WE WILL CALL YOU. If you need a refill on your cardiac medications before your next appointment, please call your pharmacy.  Labwork: CBC CMET TODAY HERE IN OUR OFFICE AT [facility]  Follow-Up: Your physician wants you to follow-up in: [place], PA-C  Thank you for choosing [facility] at [facility]!!

## 2017-06-06 LAB — CBC
Hematocrit: 57.1 % — ABNORMAL HIGH (ref 37.5–51.0)
Hemoglobin: 18.3 g/dL — ABNORMAL HIGH (ref 13.0–17.7)
MCH: 25.3 pg — ABNORMAL LOW (ref 26.6–33.0)
MCHC: 32 g/dL (ref 31.5–35.7)
MCV: 79 fL (ref 79–97)
Platelets: 130 10*3/uL — ABNORMAL LOW (ref 150–379)
RBC: 7.22 x10E6/uL (ref 4.14–5.80)
RDW: 17.5 % — ABNORMAL HIGH (ref 12.3–15.4)
WBC: 7.8 10*3/uL (ref 3.4–10.8)

## 2017-06-06 LAB — COMPREHENSIVE METABOLIC PANEL
ALT: 11 IU/L (ref 0–44)
AST: 16 IU/L (ref 0–40)
Albumin/Globulin Ratio: 2.1 (ref 1.2–2.2)
Albumin: 4.4 g/dL (ref 3.5–5.5)
Alkaline Phosphatase: 111 IU/L (ref 39–117)
BUN/Creatinine Ratio: 16 (ref 9–20)
BUN: 20 mg/dL (ref 6–24)
Bilirubin Total: 0.5 mg/dL (ref 0.0–1.2)
CO2: 28 mmol/L (ref 20–29)
Calcium: 9.2 mg/dL (ref 8.7–10.2)
Chloride: 97 mmol/L (ref 96–106)
Creatinine, Ser: 1.29 mg/dL — ABNORMAL HIGH (ref 0.76–1.27)
GFR calc Af Amer: 71 mL/min/{1.73_m2} (ref >59)
GFR calc non Af Amer: 62 mL/min/{1.73_m2} (ref >59)
Globulin, Total: 2.1 g/dL (ref 1.5–4.5)
Glucose: 103 mg/dL — ABNORMAL HIGH (ref 65–99)
Potassium: 4.5 mmol/L (ref 3.5–5.2)
Sodium: 142 mmol/L (ref 134–144)
Total Protein: 6.5 g/dL (ref 6.0–8.5)

## 2017-06-09 ENCOUNTER — Telehealth: Payer: Self-pay | Admitting: Cardiology

## 2017-06-09 NOTE — Telephone Encounter (Signed)
New message    Patient returning call from yesterday on test results

## 2017-06-09 NOTE — Telephone Encounter (Signed)
Result Notes for Comprehensive Metabolic Panel (CMET)   Notes recorded by Vennie Homans on 06/08/2017 at 9:08 AM EST Leave message for pt to call back ------  Notes recorded by Erlene Quan, PA-C on 06/08/2017 at 8:07 AM EST OK for pt to start Entresto and stop Losartan- Keep f/u with me 12/5  Kerin Ransom PA-C 06/08/2017 8:07 AM

## 2017-06-10 NOTE — Telephone Encounter (Signed)
Patient called w/lab results. Voiced understanding of PA recommendations regarding med change. Med list updated.

## 2017-06-21 ENCOUNTER — Ambulatory Visit (HOSPITAL_COMMUNITY)
Admission: EM | Admit: 2017-06-21 | Discharge: 2017-06-21 | Disposition: A | Discharge status: Home / Self Care | Payer: Medicare Other | Attending: Family Medicine | Admitting: Family Medicine

## 2017-06-21 ENCOUNTER — Encounter (HOSPITAL_COMMUNITY): Payer: Self-pay | Admitting: Emergency Medicine

## 2017-06-21 DIAGNOSIS — G8929 Other chronic pain: Secondary | ICD-10-CM

## 2017-06-21 DIAGNOSIS — M5440 Lumbago with sciatica, unspecified side: Secondary | ICD-10-CM

## 2017-06-21 MED ORDER — KETOROLAC TROMETHAMINE 60 MG/2ML IM SOLN
INTRAMUSCULAR | Status: AC
  Filled 2017-06-21: qty 2

## 2017-06-21 MED ORDER — CYCLOBENZAPRINE HCL 5 MG PO TABS: 5.0000 mg | ORAL_TABLET | Freq: Every day | ORAL | 0 refills | Status: DC

## 2017-06-21 MED ORDER — KETOROLAC TROMETHAMINE 60 MG/2ML IM SOLN
60.0000 mg | Freq: Once | INTRAMUSCULAR | Status: AC
  Administered 2017-06-21: 60 mg via INTRAMUSCULAR

## 2017-06-21 NOTE — ED Triage Notes (Signed)
Pt sts chronic lower back pain worse over last 3days

## 2017-06-21 NOTE — ED Provider Notes (Signed)
[place]   [id number] 06/21/17 Arrival Time: 1806   SUBJECTIVE:  Jerome Adams is a 56 y.o. male who presents to the urgent care with complaint of chronic lower back pain worse over last several years, somewhat worse past 3 days.   Multiple procedures including on back.  Chronic pain.  Past Medical History:  Diagnosis Date  . Alcohol abuse   . Allergy   . Arthritis    "joints" (03/30/2013)  . Chronic lower back pain   . Chronic pain syndrome   . Chronic pancreatitis ([place])   . CKD (chronic kidney disease), stage II   . Depression   . Drug abuse ([place])    a. Remote drug abuse (cocaine). UDS 07/2014 neg for cocaine.  . Essential hypertension   . GERD (gastroesophageal reflux disease)    takes  otc  . Hepatitis C 2010   a. never treated - undectable virus 12/2013.  [name] Hyperlipidemia   . Hypertriglyceridemia   . NICM (nonischemic cardiomyopathy) ([place])    a. 12/2007 Echo: EF 45-50%;  b. 07/2014 Echo: EF 20-25%, diff HK, mod dil LA, sev dil RA, mild TR, PASP 31 mmHg.  . PUD (peptic ulcer disease)   . Sleep apnea    was tested here at [facility].Marland KitchenMarland KitchenMarland KitchenNever heard anymore about it (03/30/2013)  . Spinal cord stimulator status    has had for 1 yr--inserted by [facility]  . Tobacco abuse   . Transaminitis    Family History  Problem Relation Age of Onset  . Heart disease Father        H/o MI followed by SCD @ 59.  [name] Heart disease Sister        Died suddenly @ 65 - h/o CHF s/p AICD.  [name] Breast cancer Sister   . Bone cancer Mother   . Esophageal cancer Neg Hx   . Stomach cancer Neg Hx    Social History   Socioeconomic History  . Marital status: Divorced    Spouse name: Not on file  . Number of children: 1  . Years of education: Not on file  . Highest education level: Not on file  Social Needs  . Financial resource strain: Not on file  . Food insecurity - worry: Not on file  . Food insecurity - inability: Not on file  . Transportation needs - medical: Not on file  .  Transportation needs - non-medical: Not on file  Occupational History  . Occupation: UNEMPLOYEED    Employer: UNEMPLOYED    Comment: on disability.   Tobacco Use  . Smoking status: Current Some Day Smoker    Packs/day: 0.50    Years: 40.00    Pack years: 20.00    Types: Cigarettes  . Smokeless tobacco: Never Used  Substance and Sexual Activity  . Alcohol use: Yes    Alcohol/week: 2.4 oz    Types: 4 Cans of beer per week    Comment: Has been drinking since age 28.  Has averaged 4 22 oz cans of beer daily until late 2015.  . Drug use: No    Comment: 03/30/2013 "[place]"  . Sexual activity: Not on file  Other Topics Concern  . Not on file  Social History Narrative   Lives in [place] by himself.  Does not routinely exercise.   No outpatient medications have been marked as taking for the 06/21/17 encounter [facility] Encounter).   Allergies  Allergen Reactions  . Hydrocodone-Acetaminophen Itching  ROS: As per HPI, remainder of ROS negative.   OBJECTIVE:   Vitals:   06/21/17 1826  BP: 118/79  Pulse: 61  Resp: 18  Temp: 98 F (36.7 C)  TempSrc: Oral  SpO2: 95%     General appearance: alert; no distress Eyes: PERRL; EOMI; conjunctiva normal HENT: normocephalic; atraumatic;  external ears normal without trauma; nasal mucosa normal; oral mucosa normal Neck: supple Back: no CVA tenderness Extremities: no cyanosis or edema; symmetrical with no gross deformities Skin: warm and dry Neurologic: normal gait; grossly normal SLR not particularly painful when distracted, some left calf muscle wasting Psychological: alert and cooperative; normal mood and affect      Labs:  Results for orders placed or performed in visit on 06/05/17  CBC  Result Value Ref Range   WBC 7.8 3.4 - 10.8 x10E3/uL   RBC 7.22 (HH) 4.14 - 5.80 x10E6/uL   Hemoglobin 18.3 (H) 13.0 - 17.7 g/dL   Hematocrit 57.1 (H) 37.5 - 51.0 %   MCV 79 79 - 97 fL   MCH 25.3 (L) 26.6 - 33.0 pg    MCHC 32.0 31.5 - 35.7 g/dL   RDW 17.5 (H) 12.3 - 15.4 %   Platelets 130 (L) 150 - 379 x10E3/uL  Comprehensive Metabolic Panel (CMET)  Result Value Ref Range   Glucose 103 (H) 65 - 99 mg/dL   BUN 20 6 - 24 mg/dL   Creatinine, Ser 1.29 (H) 0.76 - 1.27 mg/dL   GFR calc non Af Amer 62 >59 mL/min/1.73   GFR calc Af Amer 71 >59 mL/min/1.73   BUN/Creatinine Ratio 16 9 - 20   Sodium 142 134 - 144 mmol/L   Potassium 4.5 3.5 - 5.2 mmol/L   Chloride 97 96 - 106 mmol/L   CO2 28 20 - 29 mmol/L   Calcium 9.2 8.7 - 10.2 mg/dL   Total Protein 6.5 6.0 - 8.5 g/dL   Albumin 4.4 3.5 - 5.5 g/dL   Globulin, Total 2.1 1.5 - 4.5 g/dL   Albumin/Globulin Ratio 2.1 1.2 - 2.2   Bilirubin Total 0.5 0.0 - 1.2 mg/dL   Alkaline Phosphatase 111 39 - 117 IU/L   AST 16 0 - 40 IU/L   ALT 11 0 - 44 IU/L    Labs Reviewed - No data to display  No results found.     ASSESSMENT & PLAN:  1. Chronic low back pain with sciatica, sciatica laterality unspecified, unspecified back pain laterality     Meds ordered this encounter  Medications  . cyclobenzaprine (FLEXERIL) 5 MG tablet    Sig: Take 1 tablet (5 mg total) by mouth at bedtime.    Dispense:  7 tablet    Refill:  0  . ketorolac (TORADOL) injection 60 mg    Reviewed expectations re: course of current medical issues. Questions answered. Outlined signs and symptoms indicating need for more acute intervention. Patient verbalized understanding. After Visit Summary given.     Robyn Haber, MD 06/21/17 Valerie Roys

## 2017-06-21 NOTE — Discharge Instructions (Signed)
Follow up with Dr. Cram °

## 2017-06-30 ENCOUNTER — Ambulatory Visit: Payer: Medicare Other | Admitting: Cardiology

## 2017-07-01 ENCOUNTER — Ambulatory Visit: Payer: Medicare Other | Admitting: Cardiology

## 2017-07-22 ENCOUNTER — Encounter: Payer: Self-pay | Admitting: Cardiology

## 2017-07-22 ENCOUNTER — Ambulatory Visit (INDEPENDENT_AMBULATORY_CARE_PROVIDER_SITE_OTHER): Payer: Medicare Other | Admitting: Cardiology

## 2017-07-22 VITALS — BP 130/90 | HR 73 | Ht 70.0 in | Wt 216.8 lb

## 2017-07-22 DIAGNOSIS — Z720 Tobacco use: Secondary | ICD-10-CM

## 2017-07-22 DIAGNOSIS — M545 Low back pain: Secondary | ICD-10-CM

## 2017-07-22 DIAGNOSIS — N182 Chronic kidney disease, stage 2 (mild): Secondary | ICD-10-CM | POA: Diagnosis not present

## 2017-07-22 DIAGNOSIS — M549 Dorsalgia, unspecified: Secondary | ICD-10-CM

## 2017-07-22 DIAGNOSIS — G8929 Other chronic pain: Secondary | ICD-10-CM

## 2017-07-22 DIAGNOSIS — I10 Essential (primary) hypertension: Secondary | ICD-10-CM | POA: Diagnosis not present

## 2017-07-22 DIAGNOSIS — I2510 Atherosclerotic heart disease of native coronary artery without angina pectoris: Secondary | ICD-10-CM | POA: Diagnosis not present

## 2017-07-22 DIAGNOSIS — Z79899 Other long term (current) drug therapy: Secondary | ICD-10-CM | POA: Diagnosis not present

## 2017-07-22 DIAGNOSIS — I428 Other cardiomyopathies: Secondary | ICD-10-CM

## 2017-07-22 MED ORDER — SACUBITRIL-VALSARTAN 97-103 MG PO TABS: 1.0000 | ORAL_TABLET | Freq: Two times a day (BID) | ORAL | 3 refills | Status: DC

## 2017-07-22 NOTE — Assessment & Plan Note (Signed)
Cath Jan 2016-30% mLAD

## 2017-07-22 NOTE — Patient Instructions (Signed)
Medication Instructions: STOP the Entresto 49-51 START the Entresto 97-103 twice daily.  If you need a refill on your cardiac medications before your next appointment, please call your pharmacy.   Labwork: Your physician recommends that you return for lab work in: 2 weeks for a BMET.  Follow-Up: Your physician wants you to follow-up in: 6 months with Dr. Percival Spanish. You will receive a reminder letter in the mail two months in advance. If you don't receive a letter, please call our office at [phone] to schedule this follow-up appointment.   Thank you for choosing Heart[facility] at [facility]!!

## 2017-07-22 NOTE — Assessment & Plan Note (Signed)
EF improved to 35-40% by echo April 2018 ( previously 20-25% in 2016)

## 2017-07-22 NOTE — Assessment & Plan Note (Signed)
Seen July 2017 at [facility]. He knows to avoid NSADs.  Watch renal function as we increase Entresto

## 2017-07-22 NOTE — Assessment & Plan Note (Signed)
He has spoken with his PCP and has plan to stop after the New Year.

## 2017-07-22 NOTE — Assessment & Plan Note (Signed)
He has had two prior back surgeries and may need a 3d.

## 2017-07-22 NOTE — Assessment & Plan Note (Signed)
Controlled.  

## 2017-07-22 NOTE — Progress Notes (Signed)
07/22/2017 Jerome Adams   September 25, 1960  [id number]  Primary Physician Antonietta Jewel, MD Primary Cardiologist: Dr Percival Spanish  HPI:  56 y/o AA male, originally from [place], moved her in the 61's. He has a history of polysubstance abuse, Hepatitis C, CRI-2, mild CAD, smoking, and NICM. His substance abuse has been stable. His EF by echo Jan 2016 was 20-25%. Cath showed non obstructive CAD. Though his substance issues stabilized he did have problems with dietary and medications compliance. The pt was been seen by [facility] in 2017 for renal insufficiency. They feel he has CRI-2 and recommended periodic BMPs as well as avoiding NSAIDs.  He had acute on chronic CHF in March 2018. Echo in April showed his EF had improved to 35-40%. Entresto was added. He is in the office for f/u. He has done well from a heart failure standpoint. He denies any chest pain or unusual dyspnea.  He has problems with chronic back pain. He has had two prior back surgeries and may need a 3 d. He is still smoking but is planning on quitting after the New Year. He still occasionally forgets to take his PM Emtresto.    Current Outpatient Medications  Medication Sig Dispense Refill  . albuterol (PROVENTIL HFA;VENTOLIN HFA) 108 (90 BASE) MCG/ACT inhaler Inhale 2-4 puffs into the lungs every 6 (six) hours as needed for wheezing or shortness of breath. 1 Inhaler 0  . aspirin EC 81 MG EC tablet Take 1 tablet (81 mg total) by mouth daily. 30 tablet 0  . atorvastatin (LIPITOR) 80 MG tablet Take 80 mg by mouth daily.    [name] gemfibrozil (LOPID) 600 MG tablet Take 1 tablet (600 mg total) by mouth 2 (two) times daily before a meal. 60 tablet 5  . metoprolol succinate (TOPROL-XL) 100 MG 24 hr tablet TAKE 1 TABLET (100 MG TOTAL) BY MOUTH DAILY. TAKE WITH OR IMMEDIATELY FOLLOWING A MEAL. 30 tablet 6  . Multiple Vitamins-Minerals (MULTIVITAMIN PO) Take 1 tablet by mouth daily.    . Na Sulfate-K Sulfate-Mg Sulf 17.5-3.13-1.6 GM/180ML SOLN  Take 1 kit by mouth as directed. 354 mL 0  . omega-3 acid ethyl esters (LOVAZA) 1 G capsule Take 1 capsule (1 g total) by mouth 2 (two) times daily. 60 capsule 5  . omeprazole (PRILOSEC) 20 MG capsule Take 20 mg by mouth daily.  3  . oxycodone (ROXICODONE) 30 MG immediate release tablet Take 30 mg by mouth 4 (four) times daily.  0  . Psyllium (METAMUCIL PO) Take 2 scoop by mouth daily.    . sertraline (ZOLOFT) 100 MG tablet Take 100 mg by mouth daily.    [name] spironolactone (ALDACTONE) 25 MG tablet Take 0.5 tablets (12.5 mg total) by mouth daily. 15 tablet 0  . testosterone cypionate (DEPOTESTOTERONE CYPIONATE) 200 MG/ML injection Inject 100 mg into the muscle once a week. Saturdays.    [name] torsemide (DEMADEX) 20 MG tablet Take 2 tablets (40 mg total) by mouth 2 (two) times daily. 120 tablet 11  . sacubitril-valsartan (ENTRESTO) 97-103 MG Take 1 tablet by mouth 2 (two) times daily. 180 tablet 3   No current facility-administered medications for this visit.     Allergies  Allergen Reactions  . Hydrocodone-Acetaminophen Itching    Past Medical History:  Diagnosis Date  . Alcohol abuse   . Allergy   . Arthritis    "joints" (03/30/2013)  . Chronic lower back pain   . Chronic pain syndrome   . Chronic pancreatitis ([place])   .  CKD (chronic kidney disease), stage II   . Depression   . Drug abuse ([place])    a. Remote drug abuse (cocaine). UDS 07/2014 neg for cocaine.  . Essential hypertension   . GERD (gastroesophageal reflux disease)    takes  otc  . Hepatitis C 2010   a. never treated - undectable virus 12/2013.  [name] Hyperlipidemia   . Hypertriglyceridemia   . NICM (nonischemic cardiomyopathy) ([place])    a. 12/2007 Echo: EF 45-50%;  b. 07/2014 Echo: EF 20-25%, diff HK, mod dil LA, sev dil RA, mild TR, PASP 31 mmHg.  . PUD (peptic ulcer disease)   . Sleep apnea    was tested here at [facility].Marland KitchenMarland KitchenMarland KitchenNever heard anymore about it (03/30/2013)  . Spinal cord stimulator status    has had for 1 yr--inserted by  [facility]  . Tobacco abuse   . Transaminitis     Social History   Socioeconomic History  . Marital status: Divorced    Spouse name: Not on file  . Number of children: 1  . Years of education: Not on file  . Highest education level: Not on file  Social Needs  . Financial resource strain: Not on file  . Food insecurity - worry: Not on file  . Food insecurity - inability: Not on file  . Transportation needs - medical: Not on file  . Transportation needs - non-medical: Not on file  Occupational History  . Occupation: UNEMPLOYEED    Employer: UNEMPLOYED    Comment: on disability.   Tobacco Use  . Smoking status: Current Some Day Smoker    Packs/day: 0.50    Years: 40.00    Pack years: 20.00    Types: Cigarettes  . Smokeless tobacco: Never Used  Substance and Sexual Activity  . Alcohol use: Yes    Alcohol/week: 2.4 oz    Types: 4 Cans of beer per week    Comment: Has been drinking since age 65.  Has averaged 4 22 oz cans of beer daily until late 2015.  . Drug use: No    Comment: 03/30/2013 "[place]"  . Sexual activity: Not on file  Other Topics Concern  . Not on file  Social History Narrative   Lives in [place] by himself.  Does not routinely exercise.     Family History  Problem Relation Age of Onset  . Heart disease Father        H/o MI followed by SCD @ 98.  [name] Heart disease Sister        Died suddenly @ 65 - h/o CHF s/p AICD.  [name] Breast cancer Sister   . Bone cancer Mother   . Esophageal cancer Neg Hx   . Stomach cancer Neg Hx      Review of Systems: General: negative for chills, fever, night sweats or weight changes.  Cardiovascular: negative for chest pain, dyspnea on exertion, edema, orthopnea, palpitations, paroxysmal nocturnal dyspnea or shortness of breath Dermatological: negative for rash Respiratory: negative for cough or wheezing Urologic: negative for hematuria Abdominal: negative for nausea, vomiting, diarrhea, bright red blood per rectum,  melena, or hematemesis Neurologic: negative for visual changes, syncope, or dizziness All other systems reviewed and are otherwise negative except as noted above.    Blood pressure 130/90, pulse 73, height 5' 10" (1.778 m), weight 216 lb 12.8 oz (98.3 kg).  General appearance: alert, cooperative and no distress Lungs: clear to auscultation bilaterally Heart: regular rate and rhythm Extremities: extremities normal, atraumatic, no  cyanosis or edema Skin: Skin color, texture, turgor normal. No rashes or lesions Neurologic: Grossly normal    ASSESSMENT AND PLAN:   NICM (nonischemic cardiomyopathy) (HCC) EF improved to 35-40% by echo April 2018 ( previously 20-25% in 2016)  CKD (chronic kidney disease), stage II Seen July 2017 at [facility]. He knows to avoid NSADs.  Watch renal function as we increase Entresto  Essential hypertension Controlled  Mild coronary artery disease Cath Jan 2016-30% mLAD  Tobacco abuse He has spoken with his PCP and has plan to stop after the New Year.  Chronic back pain He has had two prior back surgeries and may need a 3d.    PLAN  Increase Entresto, f/u BMP in 2 weeks. If stable-f/u in 6 months.   Luke Kilroy PA-C 07/22/2017 3:25 PM 

## 2017-08-17 ENCOUNTER — Other Ambulatory Visit: Payer: Self-pay | Admitting: Cardiology

## 2017-10-05 ENCOUNTER — Other Ambulatory Visit: Payer: Self-pay

## 2017-10-05 MED ORDER — METOPROLOL SUCCINATE ER 100 MG PO TB24: 100.0000 mg | ORAL_TABLET | Freq: Every day | ORAL | 6 refills | Status: DC

## 2017-10-19 ENCOUNTER — Other Ambulatory Visit: Payer: Self-pay

## 2017-10-19 MED ORDER — SPIRONOLACTONE 25 MG PO TABS: 12.5000 mg | ORAL_TABLET | Freq: Every day | ORAL | 0 refills | Status: DC

## 2017-12-27 ENCOUNTER — Other Ambulatory Visit: Payer: Self-pay | Admitting: Cardiology

## 2018-01-20 ENCOUNTER — Encounter: Payer: Self-pay | Admitting: Cardiology

## 2018-01-20 NOTE — Progress Notes (Signed)
Impingement     Cardiology Office Note   Date:  01/21/2018   ID:  Jerome Adams, DOB 1960-08-14, MRN [id number]  PCP:  Antonietta Jewel, MD  Cardiologist:   Minus Breeding, MD   Chief Complaint  Patient presents with  . Shortness of Breath  . Edema    History of Present Illness: Jerome Adams is a 57 y.o. male who presents for follow up of dilated cardiomyopathy thought to be related probably to alcohol abuse.   His EF was 20 - 25% in 2016.  The EF was 35 - 40% in 2018.   He thinks that he is weaker heart again.   his belly is swollen which is where he tends to collect his fluid.  His weight is actually not dramatically although he is been as low as 211.  We have tried to have him get his meds titrated today I absolutely cannot tell what he is been taking always not been taking.  To come back for follow-up and has not been particularly successful.  His description is all over the place but I will assume he is not taking much.  I know he is not taking the chest though.  He says he is taking his diuretic once a day.  I think he is taking his beta-blocker.  He says he is trying to watch his salt.  Is not drinking alcohol.  He does still smoke cigarettes.  He is not describing PND or orthopnea.  He does have dyspnea on exertion.  He has not had any new chest pressure, neck or arm discomfort.   Past Medical History:  Diagnosis Date  . Alcohol abuse   . Arthritis    "joints" (03/30/2013)  . Chronic lower back pain   . Chronic pancreatitis ([place])   . CKD (chronic kidney disease), stage II   . COPD (chronic obstructive pulmonary disease) ([place]) 07/31/2014  . Depression   . Drug abuse ([place])    a. Remote drug abuse (cocaine). UDS 07/2014 neg for cocaine.  . Essential hypertension   . GERD (gastroesophageal reflux disease)    takes  otc  . Hepatitis C 2010   a. never treated - undectable virus 12/2013.  [name] Hyperlipidemia   . Hypertriglyceridemia   . NICM (nonischemic cardiomyopathy) ([place])    a. 12/2007 Echo: EF 45-50%;  b. 07/2014 Echo: EF 20-25%, diff HK, mod dil LA, sev dil RA, mild TR, PASP 31 mmHg.  . PUD (peptic ulcer disease)   . Sleep apnea    was tested here at [facility].Marland KitchenMarland KitchenMarland KitchenNever heard anymore about it (03/30/2013)  . Spinal cord stimulator status    has had for 1 yr--inserted by [facility]  . Tobacco abuse   . Transaminitis     Past Surgical History:  Procedure Laterality Date  . ANTERIOR CERVICAL DECOMP/DISCECTOMY FUSION  04/05/2012   Procedure: ANTERIOR CERVICAL DECOMPRESSION/DISCECTOMY FUSION 1 LEVEL;  Surgeon: Elaina Hoops, MD;  Location: [facility];  Service: Neurosurgery;  Laterality: N/A;  Cervical five-six anterior cervical decompression and fusion  . BACK SURGERY    . COLONOSCOPY WITH PROPOFOL N/A 08/28/2016   Procedure: COLONOSCOPY WITH PROPOFOL;  Surgeon: Milus Banister, MD;  Location: [facility] END[facility]COPY;  Service: Endoscopy;  Laterality: N/A;  . ESOPHAGOGASTRODUODENOSCOPY (EGD) WITH PROPOFOL N/A 08/28/2016   Procedure: ESOPHAGOGASTRODUODENOSCOPY (EGD) WITH PROPOFOL;  Surgeon: Milus Banister, MD;  Location: [facility] ENDOSCOPY;  Service: Endoscopy;  Laterality: N/A;  . FOOT SURGERY Right 2012?   "took piece off  that was hurting me" (03/30/2013)  . HARDWARE REMOVAL     "took screw out of my back; it had damaged my nerve" (03/30/2013)  . INCISION AND DRAINAGE ABSCESS Left    "lower jaw, tooth abscess" (03/30/2013)  . LEFT HEART CATHETERIZATION WITH CORONARY ANGIOGRAM N/A 08/03/2014   Procedure: LEFT HEART CATHETERIZATION WITH CORONARY ANGIOGRAM;  Surgeon: Blane Ohara, MD;  Location: [facility] C[facility];  Service: Cardiovascular;  Laterality: N/A;  . POSTERIOR LUMBAR FUSION     "L4-5" (03/31/2103)  . SPINAL CORD STIMULATOR IMPLANT  11/14/2012  . WRIST SURGERY Right 1980's   repair of tendons and nerves     Current Outpatient Medications  Medication Sig Dispense Refill  . albuterol (PROVENTIL HFA;VENTOLIN HFA) 108 (90 BASE) MCG/ACT inhaler Inhale 2-4 puffs into the lungs every 6 (six)  hours as needed for wheezing or shortness of breath. 1 Inhaler 0  . aspirin EC 81 MG EC tablet Take 1 tablet (81 mg total) by mouth daily. 30 tablet 0  . atorvastatin (LIPITOR) 80 MG tablet Take 80 mg by mouth daily.    [name] gemfibrozil (LOPID) 600 MG tablet Take 1 tablet (600 mg total) by mouth 2 (two) times daily before a meal. 60 tablet 5  . metoprolol succinate (TOPROL-XL) 100 MG 24 hr tablet Take 1 tablet (100 mg total) by mouth daily. Take with or immediately following a meal. 30 tablet 6  . Multiple Vitamins-Minerals (MULTIVITAMIN PO) Take 1 tablet by mouth daily.    . Na Sulfate-K Sulfate-Mg Sulf 17.5-3.13-1.6 GM/180ML SOLN Take 1 kit by mouth as directed. 354 mL 0  . omega-3 acid ethyl esters (LOVAZA) 1 G capsule Take 1 capsule (1 g total) by mouth 2 (two) times daily. 60 capsule 5  . omeprazole (PRILOSEC) 20 MG capsule Take 20 mg by mouth daily.  3  . oxycodone (ROXICODONE) 30 MG immediate release tablet Take 30 mg by mouth 4 (four) times daily.  0  . Psyllium (METAMUCIL PO) Take 2 scoop by mouth daily.    . sacubitril-valsartan (ENTRESTO) 97-103 MG Take 1 tablet by mouth 2 (two) times daily. 180 tablet 3  . sertraline (ZOLOFT) 100 MG tablet Take 100 mg by mouth daily.    [name] spironolactone (ALDACTONE) 25 MG tablet TAKE 0.5 TABLETS (12.5 MG TOTAL) BY MOUTH DAILY. 15 tablet 0  . testosterone cypionate (DEPOTESTOTERONE CYPIONATE) 200 MG/ML injection Inject 100 mg into the muscle once a week. Saturdays.    [name] torsemide (DEMADEX) 20 MG tablet Take 2 tablets (40 mg total) by mouth 2 (two) times daily. 120 tablet 11   No current facility-administered medications for this visit.     Allergies:   Hydrocodone-acetaminophen    ROS:  Please see the history of present illness.   Otherwise, review of systems are positive for none.   All other systems are reviewed and negative.    PHYSICAL EXAM: VS:  BP 118/86 (BP Location: Left Arm, Patient Position: Sitting, Cuff Size: Large)   Pulse (!) 58   Ht  5' 10" (1.778 m)   Wt 211 lb (95.7 kg)   BMI 30.28 kg/m  , BMI Body mass index is 30.28 kg/m.  GENERAL:  Well appearing NECK:  No jugular venous distention, waveform within normal limits, carotid upstroke brisk and symmetric, no bruits, no thyromegaly LUNGS:  Clear to auscultation bilaterally CHEST:  Unremarkable HEART:  PMI not displaced or sustained,S1 and S2 within normal limits, no S3, no S4, no clicks, no rubs, no murmurs ABD:  Flat, positive bowel sounds normal in frequency in pitch, no bruits, no rebound, no guarding, no midline pulsatile mass, no hepatomegaly, no splenomegaly EXT:  2 plus pulses throughout, no edema, no cyanosis no clubbing    EKG:  EKG is ordered today. Normal sinus rhythm, rate 58, axis rightward, poor anterior R wave progression, inferolateral T wave inversion unchanged from previous.     Recent Labs: 06/05/2017: ALT 11; BUN 20; Creatinine, Ser 1.29; Hemoglobin 18.3; Platelets 130; Potassium 4.5; Sodium 142     Wt Readings from Last 3 Encounters:  01/21/18 211 lb (95.7 kg)  07/22/17 216 lb 12.8 oz (98.3 kg)  06/05/17 222 lb 6.4 oz (100.9 kg)      Other studies Reviewed: Additional studies/ records that were reviewed today include:  None.   ASSESSMENT AND PLAN:  Chronic systolic CHF -   today I do not know with the patient is taking in terms of his medications.  His description is all over the place.  I am going to give him Entresto 49/51 twice daily but have him come back within a week to go over his medications.  Today he will need a basic metabolic profile.  His only after we know his medications that we can continue to titrate and then potentially follow-up with an echocardiogram.  Essential HTN -   This is being managed in the context of treating his CHF  CKD stage II -     I will check a BMET.   Mild nonobstructive CAD by cath 07/2014 -  He has no angina.  No change in therapy.   Alcohol abuse -  He is not drinking.   Tobacco abuse -   He  did not take Chantix which was prescribed.   He has been educated.   Current medicines are reviewed at length with the patient today.  The patient does not have concerns regarding medicines.  The following changes have been made:  As above.    Labs/ tests ordered today include:         Disposition:   FU with Pharm D and then Kerin Ransom, PAc  Signed, Minus Breeding, MD  01/21/2018 2:33 PM    [place]

## 2018-01-21 ENCOUNTER — Encounter: Payer: Self-pay | Admitting: Cardiology

## 2018-01-21 ENCOUNTER — Ambulatory Visit (INDEPENDENT_AMBULATORY_CARE_PROVIDER_SITE_OTHER): Payer: Medicare Other | Admitting: Cardiology

## 2018-01-21 VITALS — BP 118/86 | HR 58 | Ht 70.0 in | Wt 211.0 lb

## 2018-01-21 DIAGNOSIS — I5023 Acute on chronic systolic (congestive) heart failure: Secondary | ICD-10-CM | POA: Diagnosis not present

## 2018-01-21 DIAGNOSIS — I10 Essential (primary) hypertension: Secondary | ICD-10-CM

## 2018-01-21 NOTE — Patient Instructions (Signed)
Medication Instructions:  Continue current medications  If you need a refill on your cardiac medications before your next appointment, please call your pharmacy.  Labwork: None Ordered   Testing/Procedures: None Ordered  Follow-Up: Your physician wants you to follow-up in: 1 Week with Erasmo Downer.    Thank you for choosing [facility] at [facility]!!

## 2018-01-26 ENCOUNTER — Other Ambulatory Visit: Payer: Self-pay | Admitting: Cardiology

## 2018-01-27 ENCOUNTER — Ambulatory Visit: Payer: Medicare Other

## 2018-01-27 NOTE — Progress Notes (Deleted)
01/27/2018 Jerome Adams 1961/02/16 [id number]   HPI:  Jerome Adams is a 57 y.o. male patient of Dr Percival Spanish, with a PMH below who presents today for medication review and possible heart failure medication titration.   In addition to HFrEF, his medical history is significant for hypertriglyceridemia, mild CAD, hypertension, prior alcohol and cocaine abuse and CKD (stage II).    He saw Dr. Percival Spanish last week, but unfortunately there was a lot of confusion about his medications.  He was not sure what he was taking and therefore they were unable to titrate medications.  His last echo was in April 2018 and showed EF to be stable around 35%.   Dr. Percival Spanish started Delene Loll 49/51 mg and asked that we see him for potential titration as well as clarifying his medication list.    Blood Pressure Goal:  130/80  Current Medications:  Cardiac Hx:  Family Hx:  Social Hx:  Diet:  Exercise:  Home BP readings:  Intolerances:   CrCl cannot be calculated (Patient's most recent lab result is older than the maximum 21 days allowed.).  Wt Readings from Last 3 Encounters:  01/21/18 211 lb (95.7 kg)  07/22/17 216 lb 12.8 oz (98.3 kg)  06/05/17 222 lb 6.4 oz (100.9 kg)   BP Readings from Last 3 Encounters:  01/21/18 118/86  07/22/17 130/90  06/21/17 118/79   Pulse Readings from Last 3 Encounters:  01/21/18 (!) 58  07/22/17 73  06/21/17 61    Current Outpatient Medications  Medication Sig Dispense Refill  . albuterol (PROVENTIL HFA;VENTOLIN HFA) 108 (90 BASE) MCG/ACT inhaler Inhale 2-4 puffs into the lungs every 6 (six) hours as needed for wheezing or shortness of breath. 1 Inhaler 0  . aspirin EC 81 MG EC tablet Take 1 tablet (81 mg total) by mouth daily. 30 tablet 0  . atorvastatin (LIPITOR) 80 MG tablet Take 80 mg by mouth daily.    [name] gemfibrozil (LOPID) 600 MG tablet Take 1 tablet (600 mg total) by mouth 2 (two) times daily before a meal. 60 tablet 5  . metoprolol succinate  (TOPROL-XL) 100 MG 24 hr tablet Take 1 tablet (100 mg total) by mouth daily. Take with or immediately following a meal. 30 tablet 6  . Multiple Vitamins-Minerals (MULTIVITAMIN PO) Take 1 tablet by mouth daily.    . Na Sulfate-K Sulfate-Mg Sulf 17.5-3.13-1.6 GM/180ML SOLN Take 1 kit by mouth as directed. 354 mL 0  . omega-3 acid ethyl esters (LOVAZA) 1 G capsule Take 1 capsule (1 g total) by mouth 2 (two) times daily. 60 capsule 5  . omeprazole (PRILOSEC) 20 MG capsule Take 20 mg by mouth daily.  3  . oxycodone (ROXICODONE) 30 MG immediate release tablet Take 30 mg by mouth 4 (four) times daily.  0  . Psyllium (METAMUCIL PO) Take 2 scoop by mouth daily.    . sacubitril-valsartan (ENTRESTO) 97-103 MG Take 1 tablet by mouth 2 (two) times daily. 180 tablet 3  . sertraline (ZOLOFT) 100 MG tablet Take 100 mg by mouth daily.    [name] spironolactone (ALDACTONE) 25 MG tablet TAKE 0.5 TABLETS (12.5 MG TOTAL) BY MOUTH DAILY. 15 tablet 0  . testosterone cypionate (DEPOTESTOTERONE CYPIONATE) 200 MG/ML injection Inject 100 mg into the muscle once a week. Saturdays.    [name] torsemide (DEMADEX) 20 MG tablet Take 2 tablets (40 mg total) by mouth 2 (two) times daily. 120 tablet 11   No current facility-administered medications for this visit.  Allergies  Allergen Reactions  . Hydrocodone-Acetaminophen Itching    Past Medical History:  Diagnosis Date  . Alcohol abuse   . Arthritis    "joints" (03/30/2013)  . Chronic lower back pain   . Chronic pancreatitis ([place])   . CKD (chronic kidney disease), stage II   . COPD (chronic obstructive pulmonary disease) ([place]) 07/31/2014  . Depression   . Drug abuse ([place])    a. Remote drug abuse (cocaine). UDS 07/2014 neg for cocaine.  . Essential hypertension   . GERD (gastroesophageal reflux disease)    takes  otc  . Hepatitis C 2010   a. never treated - undectable virus 12/2013.  [name] Hyperlipidemia   . Hypertriglyceridemia   . NICM (nonischemic cardiomyopathy) ([place])    a.  12/2007 Echo: EF 45-50%;  b. 07/2014 Echo: EF 20-25%, diff HK, mod dil LA, sev dil RA, mild TR, PASP 31 mmHg.  . PUD (peptic ulcer disease)   . Sleep apnea    was tested here at [facility].Marland KitchenMarland KitchenMarland KitchenNever heard anymore about it (03/30/2013)  . Spinal cord stimulator status    has had for 1 yr--inserted by [facility]  . Tobacco abuse   . Transaminitis     There were no vitals taken for this visit.  No problem-specific Assessment & Plan notes found for this encounter.   Tommy Medal PharmD CPP [facility] [address] [place], [place] [address] [phone]

## 2018-01-29 ENCOUNTER — Ambulatory Visit: Payer: Medicare Other

## 2018-01-29 ENCOUNTER — Encounter

## 2018-01-29 NOTE — Progress Notes (Unsigned)
01/29/2018 URI TURNBOUGH 1961/05/12 [id number]   HPI:  Jerome Adams is a 57 y.o. male patient of Dr Percival Spanish, with a PMH below who presents today for hypertension clinic evaluation.  Blood Pressure Goal:  130/80  Current Medications:  Cardiac Hx:  Family Hx:  Social Hx:  Diet:  Exercise:  Home BP readings:  Intolerances:   CrCl cannot be calculated (Patient's most recent lab result is older than the maximum 21 days allowed.).  Wt Readings from Last 3 Encounters:  01/21/18 211 lb (95.7 kg)  07/22/17 216 lb 12.8 oz (98.3 kg)  06/05/17 222 lb 6.4 oz (100.9 kg)   BP Readings from Last 3 Encounters:  01/21/18 118/86  07/22/17 130/90  06/21/17 118/79   Pulse Readings from Last 3 Encounters:  01/21/18 (!) 58  07/22/17 73  06/21/17 61    Current Outpatient Medications  Medication Sig Dispense Refill  . albuterol (PROVENTIL HFA;VENTOLIN HFA) 108 (90 BASE) MCG/ACT inhaler Inhale 2-4 puffs into the lungs every 6 (six) hours as needed for wheezing or shortness of breath. 1 Inhaler 0  . aspirin EC 81 MG EC tablet Take 1 tablet (81 mg total) by mouth daily. 30 tablet 0  . atorvastatin (LIPITOR) 80 MG tablet Take 80 mg by mouth daily.    [name] gemfibrozil (LOPID) 600 MG tablet Take 1 tablet (600 mg total) by mouth 2 (two) times daily before a meal. 60 tablet 5  . metoprolol succinate (TOPROL-XL) 100 MG 24 hr tablet Take 1 tablet (100 mg total) by mouth daily. Take with or immediately following a meal. 30 tablet 6  . Multiple Vitamins-Minerals (MULTIVITAMIN PO) Take 1 tablet by mouth daily.    . Na Sulfate-K Sulfate-Mg Sulf 17.5-3.13-1.6 GM/180ML SOLN Take 1 kit by mouth as directed. 354 mL 0  . omega-3 acid ethyl esters (LOVAZA) 1 G capsule Take 1 capsule (1 g total) by mouth 2 (two) times daily. 60 capsule 5  . omeprazole (PRILOSEC) 20 MG capsule Take 20 mg by mouth daily.  3  . oxycodone (ROXICODONE) 30 MG immediate release tablet Take 30 mg by mouth 4 (four) times  daily.  0  . Psyllium (METAMUCIL PO) Take 2 scoop by mouth daily.    . sacubitril-valsartan (ENTRESTO) 97-103 MG Take 1 tablet by mouth 2 (two) times daily. 180 tablet 3  . sertraline (ZOLOFT) 100 MG tablet Take 100 mg by mouth daily.    [name] spironolactone (ALDACTONE) 25 MG tablet TAKE 0.5 TABLETS (12.5 MG TOTAL) BY MOUTH DAILY. 15 tablet 0  . testosterone cypionate (DEPOTESTOTERONE CYPIONATE) 200 MG/ML injection Inject 100 mg into the muscle once a week. Saturdays.    [name] torsemide (DEMADEX) 20 MG tablet TAKE 2 TABLETS BY MOUTH TWICE A DAY 360 tablet 1   No current facility-administered medications for this visit.     Allergies  Allergen Reactions  . Hydrocodone-Acetaminophen Itching    Past Medical History:  Diagnosis Date  . Alcohol abuse   . Arthritis    "joints" (03/30/2013)  . Chronic lower back pain   . Chronic pancreatitis ([place])   . CKD (chronic kidney disease), stage II   . COPD (chronic obstructive pulmonary disease) ([place]) 07/31/2014  . Depression   . Drug abuse ([place])    a. Remote drug abuse (cocaine). UDS 07/2014 neg for cocaine.  . Essential hypertension   . GERD (gastroesophageal reflux disease)    takes  otc  . Hepatitis C 2010   a. never treated -  undectable virus 12/2013.  [name] Hyperlipidemia   . Hypertriglyceridemia   . NICM (nonischemic cardiomyopathy) ([place])    a. 12/2007 Echo: EF 45-50%;  b. 07/2014 Echo: EF 20-25%, diff HK, mod dil LA, sev dil RA, mild TR, PASP 31 mmHg.  . PUD (peptic ulcer disease)   . Sleep apnea    was tested here [facility].Marland KitchenMarland KitchenMarland KitchenNever heard anymore about it (03/30/2013)  . Spinal cord stimulator status    has had for 1 yr--inserted by [facility]  . Tobacco abuse   . Transaminitis     There were no vitals taken for this visit.  No problem-specific Assessment & Plan notes found for this encounter.   Tommy Medal PharmD CPP [facility] [address] [place], [place] [address] [phone]

## 2018-01-30 ENCOUNTER — Other Ambulatory Visit: Payer: Self-pay | Admitting: Cardiology

## 2018-02-01 NOTE — Telephone Encounter (Signed)
Rx sent to pharmacy   

## 2018-02-05 ENCOUNTER — Other Ambulatory Visit: Payer: Self-pay

## 2018-02-05 MED ORDER — SPIRONOLACTONE 25 MG PO TABS: 12.5000 mg | ORAL_TABLET | Freq: Every day | ORAL | 10 refills | Status: DC

## 2018-02-05 MED ORDER — TORSEMIDE 20 MG PO TABS: 40.0000 mg | ORAL_TABLET | Freq: Two times a day (BID) | ORAL | 10 refills | Status: DC

## 2018-02-05 MED ORDER — SACUBITRIL-VALSARTAN 97-103 MG PO TABS: 1.0000 | ORAL_TABLET | Freq: Two times a day (BID) | ORAL | 10 refills | Status: DC

## 2018-02-18 ENCOUNTER — Ambulatory Visit: Payer: Medicare Other

## 2018-02-18 ENCOUNTER — Ambulatory Visit (INDEPENDENT_AMBULATORY_CARE_PROVIDER_SITE_OTHER): Payer: Medicare Other | Admitting: Pharmacist

## 2018-02-18 VITALS — BP 118/86 | HR 60

## 2018-02-18 DIAGNOSIS — I5022 Chronic systolic (congestive) heart failure: Secondary | ICD-10-CM | POA: Diagnosis not present

## 2018-02-18 MED ORDER — TORSEMIDE 20 MG PO TABS: 40.0000 mg | ORAL_TABLET | Freq: Once | ORAL | 10 refills | Status: DC

## 2018-02-18 MED ORDER — SACUBITRIL-VALSARTAN 49-51 MG PO TABS: 1.0000 | ORAL_TABLET | Freq: Two times a day (BID) | ORAL | 2 refills | Status: DC

## 2018-02-18 MED ORDER — SPIRONOLACTONE 25 MG PO TABS: 25.0000 mg | ORAL_TABLET | ORAL | 10 refills | Status: DC

## 2018-02-18 NOTE — Patient Instructions (Addendum)
Return for a  follow up appointment in 4 weeks  Check your blood pressure at home daily (if able) and keep record of the readings.  Take your BP meds as follows: *CHANGE ENTRESTO to 49/51mg  twice daily* *CONTINUA taking Torsemide 40mg  once per day* *Continue all other medication as previously prescribed*  Bring all of your meds, your BP cuff and your record of home blood pressures to your next appointment.  Exercise as you're able, try to walk approximately 30 minutes per day.  Keep salt intake to a minimum, especially watch canned and prepared boxed foods.  Eat more fresh fruits and vegetables and fewer canned items.  Avoid eating in fast food restaurants.    HOW TO TAKE YOUR BLOOD PRESSURE: . Rest 5 minutes before taking your blood pressure. .  Don't smoke or drink caffeinated beverages for at least 30 minutes before. . Take your blood pressure before (not after) you eat. . Sit comfortably with your back supported and both feet on the floor (don't cross your legs). . Elevate your arm to heart level on a table or a desk. . Use the proper sized cuff. It should fit smoothly and snugly around your bare upper arm. There should be enough room to slip a fingertip under the cuff. The bottom edge of the cuff should be 1 inch above the crease of the elbow. . Ideally, take 3 measurements at one sitting and record the average.

## 2018-02-18 NOTE — Progress Notes (Signed)
Patient ID: Jerome Adams                 DOB: October 08, 1960                      MRN: [id number]      HPI: Jerome Adams is a 57 y.o. male referred by Dr. Percival Spanish to pharmacist clinic for medication management. PMH includes cardiomyopathy with EF 35% (2018), hx of alcohol abuse, CKD, depression, GERD, Hepatitis C, hyperlipidemia, sleep apnea, and compliance issues.  Patient reports some shortness of breath, and lightheadedness on exertion. Denies swelling, increase fatigue or any other problems. He is not monitoring weight or BP at home. Also reports taking ONLY morning medication.   Current HTN meds:  Metoprolol succinate 100mg  daily ENTRESTO 49/51mg  2 tablets twice daily (takes once daily) Spironolactone 25mg  every other day Torsemide 40mg  daily  Intolerance: none  BP goal: 130/80  Family History: father died young of heart disease  Social History: cigarettes smoker, ~4 cans of beer per week  Diet: still working on low sodium diet, wakes up late and has problems sleeping (no set routine for meals)  Exercise: minimal; no actual regimen  Home BP readings: none provided today  Wt Readings from Last 3 Encounters:  01/21/18 211 lb (95.7 kg)  07/22/17 216 lb 12.8 oz (98.3 kg)  06/05/17 222 lb 6.4 oz (100.9 kg)   BP Readings from Last 3 Encounters:  02/18/18 118/86  01/21/18 118/86  07/22/17 130/90   Pulse Readings from Last 3 Encounters:  02/18/18 60  01/21/18 (!) 58  07/22/17 73    Past Medical History:  Diagnosis Date  . Alcohol abuse   . Arthritis    "joints" (03/30/2013)  . Chronic lower back pain   . Chronic pancreatitis ([place])   . CKD (chronic kidney disease), stage II   . COPD (chronic obstructive pulmonary disease) ([place]) 07/31/2014  . Depression   . Drug abuse ([place])    a. Remote drug abuse (cocaine). UDS 07/2014 neg for cocaine.  . Essential hypertension   . GERD (gastroesophageal reflux disease)    takes  otc  . Hepatitis C 2010   a. never treated -  undectable virus 12/2013.  [name] Hyperlipidemia   . Hypertriglyceridemia   . NICM (nonischemic cardiomyopathy) ([place])    a. 12/2007 Echo: EF 45-50%;  b. 07/2014 Echo: EF 20-25%, diff HK, mod dil LA, sev dil RA, mild TR, PASP 31 mmHg.  . PUD (peptic ulcer disease)   . Sleep apnea    was tested here at [facility].Marland KitchenMarland KitchenMarland KitchenNever heard anymore about it (03/30/2013)  . Spinal cord stimulator status    has had for 1 yr--inserted by [facility]  . Tobacco abuse   . Transaminitis     Current Outpatient Medications on File Prior to Visit  Medication Sig Dispense Refill  . atorvastatin (LIPITOR) 80 MG tablet Take 80 mg by mouth daily.    [name] gemfibrozil (LOPID) 600 MG tablet Take 1 tablet (600 mg total) by mouth 2 (two) times daily before a meal. 60 tablet 5  . metoprolol succinate (TOPROL-XL) 100 MG 24 hr tablet Take 1 tablet (100 mg total) by mouth daily. Take with or immediately following a meal. 30 tablet 6  . Multiple Vitamins-Minerals (MULTIVITAMIN PO) Take 1 tablet by mouth daily.    [name] omega-3 acid ethyl esters (LOVAZA) 1 G capsule Take 1 capsule (1 g total) by mouth 2 (two) times daily. 60 capsule 5  .  omeprazole (PRILOSEC) 20 MG capsule Take 20 mg by mouth daily.  3  . oxycodone (ROXICODONE) 30 MG immediate release tablet Take 30 mg by mouth 4 (four) times daily.  0  . Psyllium (METAMUCIL PO) Take 2 scoop by mouth daily as needed.     . sertraline (ZOLOFT) 100 MG tablet Take 100 mg by mouth daily.    [name] testosterone cypionate (DEPOTESTOTERONE CYPIONATE) 200 MG/ML injection Inject 100 mg into the muscle once a week. Saturdays.    [name] albuterol (PROVENTIL HFA;VENTOLIN HFA) 108 (90 BASE) MCG/ACT inhaler Inhale 2-4 puffs into the lungs every 6 (six) hours as needed for wheezing or shortness of breath. (Patient not taking: Reported on 02/18/2018) 1 Inhaler 0  . aspirin EC 81 MG EC tablet Take 1 tablet (81 mg total) by mouth daily. (Patient not taking: Reported on 02/18/2018) 30 tablet 0   No current facility-administered  medications on file prior to visit.     Allergies  Allergen Reactions  . Hydrocodone-Acetaminophen Itching    Blood pressure 118/86, pulse 60.  Chronic systolic CHF (congestive heart failure) Blood pressure at goal during office visit and appropriate for current therapy. Noted patient still experiencing shortness of breath and dizziness on exertion but no worsening or new symptoms reported. He still struggling with depression and insomnia. Proper medication compliance is also a problem at this time.   Medication reconciliation was completed, new strategies to improve compliance, send rx to [place]. Will change ENTRESTO to 46/51mg  twice daily and spironolactone to 25mg  every other day. Patient is to monitor BP 2-3x per week, weight himself daily, and bring records to follow up appointment in 4 weeks. Plan to repeat BMET during next OV, re-assess compliance and titrate Entresto to 97/103mg  twice daily.   Kambry Takacs Rodriguez-Guzman PharmD, BCPS, [place] [address] 02/23/2018 9:20 AM

## 2018-02-23 ENCOUNTER — Encounter: Payer: Self-pay | Admitting: Pharmacist

## 2018-02-23 NOTE — Assessment & Plan Note (Signed)
Blood pressure at goal during office visit and appropriate for current therapy. Noted patient still experiencing shortness of breath and dizziness on exertion but no worsening or new symptoms reported. He still struggling with depression and insomnia. Proper medication compliance is also a problem at this time.   Medication reconciliation was completed, new strategies to improve compliance, send rx to [place]. Will change ENTRESTO to 46/51mg  twice daily and spironolactone to 25mg  every other day. Patient is to monitor BP 2-3x per week, weight himself daily, and bring records to follow up appointment in 4 weeks. Plan to repeat BMET during next OV, re-assess compliance and titrate Entresto to 97/103mg  twice daily.

## 2018-03-06 ENCOUNTER — Other Ambulatory Visit: Payer: Self-pay | Admitting: Cardiology

## 2018-03-18 ENCOUNTER — Ambulatory Visit (INDEPENDENT_AMBULATORY_CARE_PROVIDER_SITE_OTHER): Payer: Medicare Other | Admitting: Pharmacist

## 2018-03-18 VITALS — BP 118/88 | HR 60 | Ht 70.0 in | Wt 210.8 lb

## 2018-03-18 DIAGNOSIS — I5022 Chronic systolic (congestive) heart failure: Secondary | ICD-10-CM | POA: Diagnosis not present

## 2018-03-18 MED ORDER — TORSEMIDE 20 MG PO TABS: 40.0000 mg | ORAL_TABLET | Freq: Every day | ORAL | 5 refills | Status: DC

## 2018-03-18 MED ORDER — SACUBITRIL-VALSARTAN 49-51 MG PO TABS: 1.0000 | ORAL_TABLET | Freq: Two times a day (BID) | ORAL | 2 refills | Status: DC

## 2018-03-18 MED ORDER — METOPROLOL SUCCINATE ER 100 MG PO TB24: 100.0000 mg | ORAL_TABLET | Freq: Every day | ORAL | 6 refills | Status: DC

## 2018-03-18 NOTE — Patient Instructions (Signed)
Return for a a follow up appointment in 4 weeks  Check your blood pressure at home daily (if able) and keep record of the readings.  Take your BP meds as follows: *INCREASE ENTRESTO 49/51mg  to twice daily*  Bring all of your meds, your BP cuff and your record of home blood pressures to your next appointment.  Exercise as you're able, try to walk approximately 30 minutes per day.  Keep salt intake to a minimum, especially watch canned and prepared boxed foods.  Eat more fresh fruits and vegetables and fewer canned items.  Avoid eating in fast food restaurants.    HOW TO TAKE YOUR BLOOD PRESSURE: . Rest 5 minutes before taking your blood pressure. .  Don't smoke or drink caffeinated beverages for at least 30 minutes before. . Take your blood pressure before (not after) you eat. . Sit comfortably with your back supported and both feet on the floor (don't cross your legs). . Elevate your arm to heart level on a table or a desk. . Use the proper sized cuff. It should fit smoothly and snugly around your bare upper arm. There should be enough room to slip a fingertip under the cuff. The bottom edge of the cuff should be 1 inch above the crease of the elbow. . Ideally, take 3 measurements at one sitting and record the average.

## 2018-03-18 NOTE — Progress Notes (Signed)
Patient ID: Jerome Adams                 DOB: Aug 07, 1960                      MRN: [id number]      HPI: Jerome Adams is a 57 y.o. male referred by Dr. Percival Spanish to pharmacist clinic for medication management. PMH includes cardiomyopathy with EF 35% (2018), hx of alcohol abuse, CKD, depression, GERD, Hepatitis C, hyperlipidemia, sleep apnea, and compliance issues.  Patient reports improvement on shortness of breath. Denies swelling, increase fatigue or any other problems. He is not monitoring weight or BP at home. Compliance still a challenge. Patient continues to take ENTRESTO daily instead of twice daily.   Current HTN meds:  Metoprolol succinate 100mg  daily ENTRESTO 49/51mg  2  Tablet twice daily (taking 1 tablet daily) Spironolactone 1/2 tablet every daily Torsemide 40mg  daily  Intolerance: none  BP goal: 130/80  Family History: father died young of heart disease  Social History: cigarettes smoker, ~4 cans of beer per week  Diet: still working on low sodium diet, wakes up late and has problems sleeping (no set routine for meals)  Exercise: minimal; no actual regimen  Home BP readings: none provided today  Wt Readings from Last 3 Encounters:  03/18/18 210 lb 12.8 oz (95.6 kg)  01/21/18 211 lb (95.7 kg)  07/22/17 216 lb 12.8 oz (98.3 kg)   BP Readings from Last 3 Encounters:  03/18/18 118/88  02/18/18 118/86  01/21/18 118/86   Pulse Readings from Last 3 Encounters:  03/18/18 60  02/18/18 60  01/21/18 (!) 26    Past Medical History:  Diagnosis Date  . Alcohol abuse   . Arthritis    "joints" (03/30/2013)  . Chronic lower back pain   . Chronic pancreatitis ([place])   . CKD (chronic kidney disease), stage II   . COPD (chronic obstructive pulmonary disease) ([place]) 07/31/2014  . Depression   . Drug abuse ([place])    a. Remote drug abuse (cocaine). UDS 07/2014 neg for cocaine.  . Essential hypertension   . GERD (gastroesophageal reflux disease)    takes  otc  . Hepatitis  C 2010   a. never treated - undectable virus 12/2013.  [name] Hyperlipidemia   . Hypertriglyceridemia   . NICM (nonischemic cardiomyopathy) ([place])    a. 12/2007 Echo: EF 45-50%;  b. 07/2014 Echo: EF 20-25%, diff HK, mod dil LA, sev dil RA, mild TR, PASP 31 mmHg.  . PUD (peptic ulcer disease)   . Sleep apnea    was tested here at [facility].Marland KitchenMarland KitchenMarland KitchenNever heard anymore about it (03/30/2013)  . Spinal cord stimulator status    has had for 1 yr--inserted by [facility]  . Tobacco abuse   . Transaminitis     Current Outpatient Medications on File Prior to Visit  Medication Sig Dispense Refill  . albuterol (PROVENTIL HFA;VENTOLIN HFA) 108 (90 BASE) MCG/ACT inhaler Inhale 2-4 puffs into the lungs every 6 (six) hours as needed for wheezing or shortness of breath. (Patient not taking: Reported on 02/18/2018) 1 Inhaler 0  . aspirin EC 81 MG EC tablet Take 1 tablet (81 mg total) by mouth daily. (Patient not taking: Reported on 02/18/2018) 30 tablet 0  . atorvastatin (LIPITOR) 80 MG tablet Take 80 mg by mouth daily.    [name] gemfibrozil (LOPID) 600 MG tablet Take 1 tablet (600 mg total) by mouth 2 (two) times daily before a meal. 60  tablet 5  . Multiple Vitamins-Minerals (MULTIVITAMIN PO) Take 1 tablet by mouth daily.    [name] omega-3 acid ethyl esters (LOVAZA) 1 G capsule Take 1 capsule (1 g total) by mouth 2 (two) times daily. 60 capsule 5  . omeprazole (PRILOSEC) 20 MG capsule Take 20 mg by mouth daily.  3  . oxycodone (ROXICODONE) 30 MG immediate release tablet Take 30 mg by mouth 4 (four) times daily.  0  . Psyllium (METAMUCIL PO) Take 2 scoop by mouth daily as needed.     . sertraline (ZOLOFT) 100 MG tablet Take 100 mg by mouth daily.    [name] spironolactone (ALDACTONE) 25 MG tablet TAKE 1/2 TABLET BY MOUTH DAILY 15 tablet 4  . testosterone cypionate (DEPOTESTOTERONE CYPIONATE) 200 MG/ML injection Inject 100 mg into the muscle once a week. Saturdays.     No current facility-administered medications on file prior to visit.      Allergies  Allergen Reactions  . Hydrocodone-Acetaminophen Itching    Blood pressure 118/88, pulse 60, height 5\' 10"  (1.778 m), weight 210 lb 12.8 oz (95.6 kg).  Chronic systolic CHF (congestive heart failure) Blood pressure and HR remain appropriate for medication titration, but therapy compliance remains an issue for [name]. He still taking ENTRESTO once daily instead of twice daily despite written instructions and new prescription provided during last OV. Weigh also stable and no problems with current therapy reported.  Patient is transitioning to a PILL PACK program to improve compliance. Rx for ENTRESTO was sent to [place].  1 bottle sample of ENTRESTO 49/51 was provided during this office to take BID until pre-pack medication arrived by mail (LOT [id number]  EXP OCT/2021).  Will follow up in 4 weeks, repeat BMET and titrate medication as needed.   [name] PharmD, BCPS, [place] [address] 03/19/2018 5:36 PM

## 2018-03-19 ENCOUNTER — Encounter: Payer: Self-pay | Admitting: Pharmacist

## 2018-03-19 NOTE — Assessment & Plan Note (Addendum)
Blood pressure and HR remain appropriate for medication titration, but therapy compliance remains an issue for Jerome Adams. He still taking ENTRESTO once daily instead of twice daily despite written instructions and new prescription provided during last OV. Weigh also stable and no problems with current therapy reported.  Patient is transitioning to a PILL PACK program to improve compliance. Rx for ENTRESTO was sent to [place].  1 bottle sample of ENTRESTO 49/51 was provided during this office to take BID until pre-pack medication arrived by mail (LOT [id number]  EXP OCT/2021).  Will follow up in 4 weeks, repeat BMET and titrate medication as needed.

## 2018-04-15 ENCOUNTER — Ambulatory Visit: Payer: Medicare Other

## 2018-04-15 NOTE — Progress Notes (Deleted)
Patient ID: Jerome Adams                 DOB: 02-23-1961                      MRN: [id number]      HPI: Jerome Adams is a 57 y.o. male referred by Dr. Percival Spanish to pharmacist clinic for medication management. PMH includes cardiomyopathy with EF 35% (2018), hx of alcohol abuse, CKD, depression, GERD, Hepatitis C, hyperlipidemia, sleep apnea, and compliance issues.  Patient reports improvement on shortness of breath. Denies swelling, increase fatigue or any other problems. He is not monitoring weight or BP at home. Compliance still a challenge. Patient continues to take ENTRESTO daily instead of twice daily.   Current HTN meds:  Metoprolol succinate 100mg  daily ENTRESTO 49/51mg  2  Tablet twice daily (taking 1 tablet daily) Spironolactone 1/2 tablet every daily Torsemide 40mg  daily  Intolerance: none  BP goal: 130/80  Family History: father died young of heart disease  Social History: cigarettes smoker, ~4 cans of beer per week  Diet: still working on low sodium diet, wakes up late and has problems sleeping (no set routine for meals)  Exercise: minimal; no actual regimen  Home BP readings: none provided today  Wt Readings from Last 3 Encounters:  03/18/18 210 lb 12.8 oz (95.6 kg)  01/21/18 211 lb (95.7 kg)  07/22/17 216 lb 12.8 oz (98.3 kg)   BP Readings from Last 3 Encounters:  03/18/18 118/88  02/18/18 118/86  01/21/18 118/86   Pulse Readings from Last 3 Encounters:  03/18/18 60  02/18/18 60  01/21/18 (!) 76    Past Medical History:  Diagnosis Date  . Alcohol abuse   . Arthritis    "joints" (03/30/2013)  . Chronic lower back pain   . Chronic pancreatitis ([place])   . CKD (chronic kidney disease), stage II   . COPD (chronic obstructive pulmonary disease) ([place]) 07/31/2014  . Depression   . Drug abuse ([place])    a. Remote drug abuse (cocaine). UDS 07/2014 neg for cocaine.  . Essential hypertension   . GERD (gastroesophageal reflux disease)    takes  otc  . Hepatitis  C 2010   a. never treated - undectable virus 12/2013.  [name] Hyperlipidemia   . Hypertriglyceridemia   . NICM (nonischemic cardiomyopathy) ([place])    a. 12/2007 Echo: EF 45-50%;  b. 07/2014 Echo: EF 20-25%, diff HK, mod dil LA, sev dil RA, mild TR, PASP 31 mmHg.  . PUD (peptic ulcer disease)   . Sleep apnea    was tested here at [facility].Marland KitchenMarland KitchenMarland KitchenNever heard anymore about it (03/30/2013)  . Spinal cord stimulator status    has had for 1 yr--inserted by [facility]  . Tobacco abuse   . Transaminitis     Current Outpatient Medications on File Prior to Visit  Medication Sig Dispense Refill  . albuterol (PROVENTIL HFA;VENTOLIN HFA) 108 (90 BASE) MCG/ACT inhaler Inhale 2-4 puffs into the lungs every 6 (six) hours as needed for wheezing or shortness of breath. (Patient not taking: Reported on 02/18/2018) 1 Inhaler 0  . aspirin EC 81 MG EC tablet Take 1 tablet (81 mg total) by mouth daily. (Patient not taking: Reported on 02/18/2018) 30 tablet 0  . atorvastatin (LIPITOR) 80 MG tablet Take 80 mg by mouth daily.    [name] gemfibrozil (LOPID) 600 MG tablet Take 1 tablet (600 mg total) by mouth 2 (two) times daily before a meal. 60  tablet 5  . metoprolol succinate (TOPROL-XL) 100 MG 24 hr tablet Take 1 tablet (100 mg total) by mouth daily. Take with or immediately following a meal. 30 tablet 6  . Multiple Vitamins-Minerals (MULTIVITAMIN PO) Take 1 tablet by mouth daily.    [name] omega-3 acid ethyl esters (LOVAZA) 1 G capsule Take 1 capsule (1 g total) by mouth 2 (two) times daily. 60 capsule 5  . omeprazole (PRILOSEC) 20 MG capsule Take 20 mg by mouth daily.  3  . oxycodone (ROXICODONE) 30 MG immediate release tablet Take 30 mg by mouth 4 (four) times daily.  0  . Psyllium (METAMUCIL PO) Take 2 scoop by mouth daily as needed.     . sacubitril-valsartan (ENTRESTO) 49-51 MG Take 1 tablet by mouth 2 (two) times daily. 60 tablet 2  . sertraline (ZOLOFT) 100 MG tablet Take 100 mg by mouth daily.    [name] spironolactone (ALDACTONE) 25 MG  tablet TAKE 1/2 TABLET BY MOUTH DAILY 15 tablet 4  . testosterone cypionate (DEPOTESTOTERONE CYPIONATE) 200 MG/ML injection Inject 100 mg into the muscle once a week. Saturdays.    [name] torsemide (DEMADEX) 20 MG tablet Take 2 tablets (40 mg total) by mouth daily. 60 tablet 5   No current facility-administered medications on file prior to visit.     Allergies  Allergen Reactions  . Hydrocodone-Acetaminophen Itching    There were no vitals taken for this visit.  No problem-specific Assessment & Plan notes found for this encounter.   Raquel Rodriguez-Guzman PharmD, BCPS, [place] [address] [place] [address] 04/15/2018 1:07 PM

## 2018-04-25 ENCOUNTER — Encounter (HOSPITAL_COMMUNITY): Payer: Self-pay | Admitting: Emergency Medicine

## 2018-04-25 ENCOUNTER — Emergency Department (HOSPITAL_COMMUNITY)
Admission: EM | Admit: 2018-04-25 | Discharge: 2018-04-25 | Disposition: A | Discharge status: Home / Self Care | Payer: Medicare Other | Attending: Emergency Medicine | Admitting: Emergency Medicine

## 2018-04-25 ENCOUNTER — Emergency Department (HOSPITAL_COMMUNITY): Payer: Medicare Other

## 2018-04-25 ENCOUNTER — Other Ambulatory Visit: Payer: Self-pay

## 2018-04-25 DIAGNOSIS — J449 Chronic obstructive pulmonary disease, unspecified: Secondary | ICD-10-CM | POA: Insufficient documentation

## 2018-04-25 DIAGNOSIS — I5022 Chronic systolic (congestive) heart failure: Secondary | ICD-10-CM | POA: Insufficient documentation

## 2018-04-25 DIAGNOSIS — F17210 Nicotine dependence, cigarettes, uncomplicated: Secondary | ICD-10-CM | POA: Insufficient documentation

## 2018-04-25 DIAGNOSIS — N182 Chronic kidney disease, stage 2 (mild): Secondary | ICD-10-CM | POA: Insufficient documentation

## 2018-04-25 DIAGNOSIS — I130 Hypertensive heart and chronic kidney disease with heart failure and stage 1 through stage 4 chronic kidney disease, or unspecified chronic kidney disease: Secondary | ICD-10-CM | POA: Insufficient documentation

## 2018-04-25 DIAGNOSIS — M79674 Pain in right toe(s): Secondary | ICD-10-CM | POA: Insufficient documentation

## 2018-04-25 DIAGNOSIS — Z79899 Other long term (current) drug therapy: Secondary | ICD-10-CM | POA: Insufficient documentation

## 2018-04-25 DIAGNOSIS — M79671 Pain in right foot: Secondary | ICD-10-CM | POA: Diagnosis present

## 2018-04-25 MED ORDER — OXYCODONE-ACETAMINOPHEN 5-325 MG PO TABS
1.0000 | ORAL_TABLET | Freq: Once | ORAL | Status: AC
  Administered 2018-04-25: 1 via ORAL
  Filled 2018-04-25: qty 1

## 2018-04-25 NOTE — ED Triage Notes (Signed)
Daughter stepped on R great toe/foot 2 weeks ago.  C/o continued pain and swelling.  History of surgery to same.

## 2018-04-25 NOTE — ED Notes (Signed)
Patient verbalizes understanding of discharge instructions. Opportunity for questioning and answers were provided. Armband removed by staff, pt discharged from ED ambulatory.   

## 2018-04-25 NOTE — ED Notes (Signed)
Pt states child stepped on his foot. Mild swelling to the foot noted, strong pedal pulse.

## 2018-04-25 NOTE — Discharge Instructions (Addendum)
Please read and follow all provided instructions.  You have been seen today for great toe pain after injury  Tests performed today include: An x-ray of the affected area - does NOT show any broken bones or dislocations.  Vital signs. See below for your results today.   Home care instructions: -- *PRICE in the first 24-48 hours after injury: Protect (with brace, splint, sling), if given by your provider Rest Ice- Do not apply ice pack directly to your skin, place towel or similar between your skin and ice/ice pack. Apply ice for 20 min, then remove for 40 min while awake Compression- Wear brace, elastic bandage, splint as directed by your provider Elevate affected extremity above the level of your heart when not walking around for the first 24-48 hours   Follow-up instructions: Please follow-up with your primary care provider if you continue to have significant pain in 1 week. In this case you may have a more severe injury that requires further care.   Return instructions:  Please return if your toes or feet are numb or tingling, appear gray or blue, or you have severe pain (also elevate the leg and loosen splint or wrap if you were given one) Please return to the Emergency Department if you experience worsening symptoms.  Please return if you have any other emergent concerns. Additional Information:  Your vital signs today were: BP 130/87    Pulse 60    Temp 98 F (36.7 C) (Oral)    Resp 18    SpO2 99%  If your blood pressure (BP) was elevated above 135/85 this visit, please have this repeated by your doctor within one month. ---------------

## 2018-04-25 NOTE — ED Notes (Signed)
Post Op shoe applied to Rt foot

## 2018-04-25 NOTE — ED Provider Notes (Signed)
[facility] EMERGENCY DEPARTMENT Provider Note   CSN: [id number] Arrival date & time: 04/25/18  1701     History   Chief Complaint Chief Complaint  Patient presents with  . Foot Pain    HPI Jerome Adams is a 57 y.o. male with a history as below who presents the emergent department today for right foot pain.  Patient reports that his 25 pound daughter was standing up for approximately 2 weeks ago when she twisted and he felt a pop sensation in his right big toe.  He has been having pain in that area especially when walking since that time.  He has been taking oxycodone at home for symptoms with mild to moderate relief.  He is coming in because he thinks he may be broken.  He denies any swelling, numbness/tingling.  He does note previous surgery in that toe.  HPI  Past Medical History:  Diagnosis Date  . Alcohol abuse   . Arthritis    "joints" (03/30/2013)  . Chronic lower back pain   . Chronic pancreatitis ([place])   . CKD (chronic kidney disease), stage II   . COPD (chronic obstructive pulmonary disease) ([place]) 07/31/2014  . Depression   . Drug abuse ([place])    a. Remote drug abuse (cocaine). UDS 07/2014 neg for cocaine.  . Essential hypertension   . GERD (gastroesophageal reflux disease)    takes  otc  . Hepatitis C 2010   a. never treated - undectable virus 12/2013.  [name] Hyperlipidemia   . Hypertriglyceridemia   . NICM (nonischemic cardiomyopathy) ([place])    a. 12/2007 Echo: EF 45-50%;  b. 07/2014 Echo: EF 20-25%, diff HK, mod dil LA, sev dil RA, mild TR, PASP 31 mmHg.  . PUD (peptic ulcer disease)   . Sleep apnea    was tested here at [facility].Marland KitchenMarland KitchenMarland KitchenNever heard anymore about it (03/30/2013)  . Spinal cord stimulator status    has had for 1 yr--inserted by [facility]  . Tobacco abuse   . Transaminitis     Patient Active Problem List   Diagnosis Date Noted  . Chronic back pain 07/22/2017  . Change in bowel habits   . History of adenomatous polyp of colon   .  Benign neoplasm of ascending colon   . Bloating   . Gastritis and gastroduodenitis   . Non compliance w medication regimen 03/27/2016  . Mild coronary artery disease 08/09/2014  . CKD (chronic kidney disease), stage II   . Chronic pain syndrome   . Tobacco abuse   . Transaminitis   . Chronic systolic CHF (congestive heart failure) ([place])   . Drug abuse ([place])   . Alcohol abuse   . NICM (nonischemic cardiomyopathy) ([place])   . Chronic pancreatitis ([place])   . Chronic lower back pain   . GERD (gastroesophageal reflux disease)   . Essential hypertension   . PUD (peptic ulcer disease)   . Acute systolic CHF (congestive heart failure) ([place]) 08/01/2014  . Abdominal pain of unknown etiology   . SOB (shortness of breath)   . COPD (chronic obstructive pulmonary disease) ([place]) 07/31/2014  . Tinnitus of right ear 01/16/2014  . Night sweats 12/28/2013  . Snoring 10/05/2013  . Inadequate sleep hygiene 10/05/2013  . RLS (restless legs syndrome) 05/26/2013  . History of pancreatitis 03/30/2013  . Hypertriglyceridemia 03/30/2013  . Insomnia 03/29/2013  . Chronic diarrhea 03/03/2013  . Dyslipidemia 04/22/2012  . Cocaine abuse in remission ([place]) 04/22/2012  . Vision changes 04/22/2012  .  Left shoulder pain 03/05/2012  . Ulnar nerve neuropathy 02/23/2012  . Trichomonal infection 11/05/2011  . Low testosterone 10/07/2011  . Fatigue 10/07/2011  . HCV (hepatitis C virus) 03/24/2011  . Routine adult health maintenance 11/19/2010  . ERECTILE DYSFUNCTION, NON-ORGANIC 03/19/2010  . NARCOTIC ABUSE 07/03/2008  . DEGENERATIVE DISC DISEASE, LUMBAR SPINE, WITH MYELOPATHY 09/17/2007    Past Surgical History:  Procedure Laterality Date  . ANTERIOR CERVICAL DECOMP/DISCECTOMY FUSION  04/05/2012   Procedure: ANTERIOR CERVICAL DECOMPRESSION/DISCECTOMY FUSION 1 LEVEL;  Surgeon: Elaina Hoops, MD;  Location: [facility];  Service: Neurosurgery;  Laterality: N/A;  Cervical five-six anterior cervical decompression  and fusion  . BACK SURGERY    . COLONOSCOPY WITH PROPOFOL N/A 08/28/2016   Procedure: COLONOSCOPY WITH PROPOFOL;  Surgeon: Milus Banister, MD;  Location: [facility] END[facility]COPY;  Service: Endoscopy;  Laterality: N/A;  . ESOPHAGOGASTRODUODENOSCOPY (EGD) WITH PROPOFOL N/A 08/28/2016   Procedure: ESOPHAGOGASTRODUODENOSCOPY (EGD) WITH PROPOFOL;  Surgeon: Milus Banister, MD;  Location: [facility] ENDOSCOPY;  Service: Endoscopy;  Laterality: N/A;  . FOOT SURGERY Right 2012?   "took piece off that was hurting me" (03/30/2013)  . HARDWARE REMOVAL     "took screw out of my back; it had damaged my nerve" (03/30/2013)  . INCISION AND DRAINAGE ABSCESS Left    "lower jaw, tooth abscess" (03/30/2013)  . LEFT HEART CATHETERIZATION WITH CORONARY ANGIOGRAM N/A 08/03/2014   Procedure: LEFT HEART CATHETERIZATION WITH CORONARY ANGIOGRAM;  Surgeon: Blane Ohara, MD;  Location: [facility] CATH LAB;  Service: Cardiovascular;  Laterality: N/A;  . POSTERIOR LUMBAR FUSION     "L4-5" (03/31/2103)  . SPINAL CORD STIMULATOR IMPLANT  11/14/2012  . WRIST SURGERY Right 1980's   repair of tendons and nerves        Home Medications    Prior to Admission medications   Medication Sig Start Date End Date Taking? Authorizing Provider  albuterol (PROVENTIL HFA;VENTOLIN HFA) 108 (90 BASE) MCG/ACT inhaler Inhale 2-4 puffs into the lungs every 6 (six) hours as needed for wheezing or shortness of breath. Patient not taking: Reported on 02/18/2018 04/25/14   Ernestina Patches, MD  aspirin EC 81 MG EC tablet Take 1 tablet (81 mg total) by mouth daily. Patient not taking: Reported on 02/18/2018 08/04/14   Orson Eva, MD  atorvastatin (LIPITOR) 80 MG tablet Take 80 mg by mouth daily. 07/09/16   [provider]  gemfibrozil (LOPID) 600 MG tablet Take 1 tablet (600 mg total) by mouth 2 (two) times daily before a meal. 04/07/13   Dominic Pea, DO  metoprolol succinate (TOPROL-XL) 100 MG 24 hr tablet Take 1 tablet (100 mg total) by mouth daily. Take with or  immediately following a meal. 03/18/18   Minus Breeding, MD  Multiple Vitamins-Minerals (MULTIVITAMIN PO) Take 1 tablet by mouth daily.    [provider]  omega-3 acid ethyl esters (LOVAZA) 1 G capsule Take 1 capsule (1 g total) by mouth 2 (two) times daily. 04/07/13   Dominic Pea, DO  omeprazole (PRILOSEC) 20 MG capsule Take 20 mg by mouth daily. 08/04/16   [provider]  oxycodone (ROXICODONE) 30 MG immediate release tablet Take 30 mg by mouth 4 (four) times daily. 06/29/17   [provider]  Psyllium (METAMUCIL PO) Take 2 scoop by mouth daily as needed.     [provider]  sacubitril-valsartan (ENTRESTO) 49-51 MG Take 1 tablet by mouth 2 (two) times daily. 03/18/18   Minus Breeding, MD  sertraline (ZOLOFT) 100 MG tablet Take  100 mg by mouth daily.    [provider]  spironolactone (ALDACTONE) 25 MG tablet TAKE 1/2 TABLET BY MOUTH DAILY 03/09/18   Minus Breeding, MD  testosterone cypionate (DEPOTESTOTERONE CYPIONATE) 200 MG/ML injection Inject 100 mg into the muscle once a week. Saturdays.    [provider]  torsemide (DEMADEX) 20 MG tablet Take 2 tablets (40 mg total) by mouth daily. 03/18/18   Minus Breeding, MD    Family History Family History  Problem Relation Age of Onset  . Heart disease Father        H/o MI followed by SCD @ 16.  [name] Heart disease Sister        Died suddenly @ 11 - h/o CHF s/p AICD.  [name] Breast cancer Sister   . Bone cancer Mother   . Esophageal cancer Neg Hx   . Stomach cancer Neg Hx     Social History Social History   Tobacco Use  . Smoking status: Current Some Day Smoker    Packs/day: 0.50    Years: 40.00    Pack years: 20.00    Types: Cigarettes  . Smokeless tobacco: Never Used  Substance Use Topics  . Alcohol use: Yes    Alcohol/week: 4.0 standard drinks    Types: 4 Cans of beer per week    Comment: Has been drinking since age 46.  Has averaged 4 22 oz cans of beer daily until late 2015.  .  Drug use: No    Types: Cocaine    Comment: 03/30/2013 "[place]"     Allergies   Hydrocodone-acetaminophen   Review of Systems Review of Systems  Constitutional: Negative for fever.  Musculoskeletal: Positive for arthralgias.  Skin: Negative for color change and wound.  Neurological: Negative for weakness and numbness.     Physical Exam Updated Vital Signs There were no vitals taken for this visit.  Physical Exam  Constitutional: He appears well-developed and well-nourished.  HENT:  Head: Normocephalic and atraumatic.  Right Ear: External ear normal.  Left Ear: External ear normal.  Eyes: Conjunctivae are normal. Right eye exhibits no discharge. Left eye exhibits no discharge. No scleral icterus.  Cardiovascular:  Pulses:      Dorsalis pedis pulses are 2+ on the right side.       Posterior tibial pulses are 2+ on the right side.  Pulmonary/Chest: Effort normal. No respiratory distress.  Musculoskeletal:       Feet:  Neurological: He is alert. He has normal strength. No sensory deficit.  Skin: Skin is warm, dry and intact. Capillary refill takes less than 2 seconds. No erythema. No pallor.  Psychiatric: He has a normal mood and affect.  Nursing note and vitals reviewed.    ED Treatments / Results  Labs (all labs ordered are listed, but only abnormal results are displayed) Labs Reviewed - No data to display  EKG None  Radiology No results found.  Procedures Procedures (including critical care time)  Medications Ordered in ED Medications - No data to display   Initial Impression / Assessment and Plan / ED Course  I have reviewed the triage vital signs and the nursing notes.  Pertinent labs & imaging results that were available during my care of the patient were reviewed by me and considered in my medical decision making (see chart for details).     57 y.o. male with right great toe after injury. He is NVI. Patient X-Ray negative for obvious  fracture or dislocation. Pain managed  in ED. Pt advised to follow up with pcp if symptoms persist for possibility of missed fracture diagnosis. Patient given post op shoe while in ED, conservative therapy recommended and discussed. Patient will be dc home & is agreeable with above plan. Return preactions discussed. Patient has pain medication at home.   Final Clinical Impressions(s) / ED Diagnoses   Final diagnoses:  Toe pain, right    ED Discharge Orders    None       Lorelle Gibbs 04/25/18 1942    Isla Pence, MD 04/25/18 2027

## 2018-05-20 ENCOUNTER — Other Ambulatory Visit (HOSPITAL_COMMUNITY): Payer: Self-pay | Admitting: Internal Medicine

## 2018-05-20 ENCOUNTER — Ambulatory Visit (HOSPITAL_COMMUNITY)
Admission: RE | Admit: 2018-05-20 | Discharge: 2018-05-20 | Disposition: A | Discharge status: Home / Self Care | Payer: 59 | Source: Ambulatory Visit | Attending: Internal Medicine | Admitting: Internal Medicine

## 2018-05-20 DIAGNOSIS — R0602 Shortness of breath: Secondary | ICD-10-CM | POA: Insufficient documentation

## 2018-08-16 ENCOUNTER — Other Ambulatory Visit: Payer: Self-pay | Admitting: Cardiology

## 2019-01-12 ENCOUNTER — Other Ambulatory Visit: Payer: Self-pay | Admitting: Cardiology

## 2019-02-02 ENCOUNTER — Telehealth: Payer: Self-pay

## 2019-02-02 NOTE — Telephone Encounter (Signed)
Covid-19 screening questions   Do you now or have you had a fever in the last 14 days? No  Do you have any respiratory symptoms of shortness of breath or cough now or in the last 14 days? No  Do you have any family members or close contacts with diagnosed or suspected Covid-19 in the past 14 days? No  Have you been tested for Covid-19 and found to be positive? No        

## 2019-02-03 ENCOUNTER — Ambulatory Visit: Payer: 59 | Admitting: Nurse Practitioner

## 2019-02-09 ENCOUNTER — Encounter: Payer: Self-pay | Admitting: *Deleted

## 2019-02-10 ENCOUNTER — Telehealth: Payer: Self-pay | Admitting: Cardiology

## 2019-02-10 NOTE — Progress Notes (Signed)
Cardiology Office Note   Date:  02/11/2019   ID:  SHYHEIM TANNEY, DOB November 18, 1960, MRN [id number]  PCP:  Antonietta Jewel, MD  Cardiologist:   No primary care provider on file.   Chief Complaint  Patient presents with  . Bloated      History of Present Illness: Jerome Adams is a 58 y.o. male who presents for who presents for follow up of dilated cardiomyopathy thought to be related probably to alcohol abuse.  His EF was 20 - 25% in 2016.  The EF was 35 - 40% in 2018.    He returns for follow-up.  He says he has been getting lightheaded with things like picking up his grandchild or walking up the stairs.  He has had some abdominal bloating.  He is not having significant weight gain.  He is not having PND orthopnea or swelling.  He has not had any palpitations, presyncope or syncope.  He does get some discomfort going occasionally up to the left side of his neck and the back.  But he is not having any substernal chest pain.  He tries to watch his salt but it turns out he has not taken his Entresto twice daily.  He says that he forgets to take it and he has audible sleep hours.    Past Medical History:  Diagnosis Date  . Alcohol abuse   . Arthritis    "joints" (03/30/2013)  . Chronic lower back pain   . Chronic pancreatitis ([place])   . CKD (chronic kidney disease), stage II   . COPD (chronic obstructive pulmonary disease) ([place]) 07/31/2014  . Depression   . Drug abuse ([place])    a. Remote drug abuse (cocaine). UDS 07/2014 neg for cocaine.  . Essential hypertension   . GERD (gastroesophageal reflux disease)    takes  otc  . Hepatitis C 2010   a. never treated - undectable virus 12/2013.  [name] Hyperlipidemia   . Hypertriglyceridemia   . NICM (nonischemic cardiomyopathy) ([place])    a. 12/2007 Echo: EF 45-50%;  b. 07/2014 Echo: EF 20-25%, diff HK, mod dil LA, sev dil RA, mild TR, PASP 31 mmHg.  . PUD (peptic ulcer disease)   . Sleep apnea    was [facility].Marland KitchenMarland KitchenMarland KitchenNever heard anymore  about it (03/30/2013)  . Spinal cord stimulator status    has had for 1 yr--inserted by [facility]  . Tobacco abuse   . Transaminitis     Past Surgical History:  Procedure Laterality Date  . ANTERIOR CERVICAL DECOMP/DISCECTOMY FUSION  04/05/2012   Procedure: ANTERIOR CERVICAL DECOMPRESSION/DISCECTOMY FUSION 1 LEVEL;  Surgeon: Elaina Hoops, MD;  Location: [facility];  Service: Neurosurgery;  Laterality: N/A;  Cervical five-six anterior cervical decompression and fusion  . BACK SURGERY    . COLONOSCOPY WITH PROPOFOL N/A 08/28/2016   Procedure: COLONOSCOPY WITH PROPOFOL;  Surgeon: Milus Banister, MD;  [facility]: [facility];  Service: Endoscopy;  Laterality: N/A;  . ESOPHAGOGASTRODUODENOSCOPY (EGD) WITH PROPOFOL N/A 08/28/2016   Procedure: ESOPHAGOGASTRODUODENOSCOPY (EGD) WITH PROPOFOL;  Surgeon: Milus Banister, MD;  Location: [facility];  Service: Endoscopy;  Laterality: N/A;  . FOOT SURGERY Right 2012?   "took piece off that was hurting me" (03/30/2013)  . HARDWARE REMOVAL     "took screw out of my back; it had damaged my nerve" (03/30/2013)  . INCISION AND DRAINAGE ABSCESS Left    "lower jaw, tooth abscess" (03/30/2013)  . LEFT HEART CATHETERIZATION WITH CORONARY  ANGIOGRAM N/A 08/03/2014   Procedure: LEFT HEART CATHETERIZATION WITH CORONARY ANGIOGRAM;  Surgeon: Blane Ohara, MD;  Location: [facility] CATH LAB;  Service: Cardiovascular;  Laterality: N/A;  . POSTERIOR LUMBAR FUSION     "L4-5" (03/31/2103)  . SPINAL CORD STIMULATOR IMPLANT  11/14/2012  . WRIST SURGERY Right 1980's   repair of tendons and nerves     Current Outpatient Medications  Medication Sig Dispense Refill  . aspirin EC 81 MG EC tablet Take 1 tablet (81 mg total) by mouth daily. 30 tablet 0  . gemfibrozil (LOPID) 600 MG tablet Take 1 tablet (600 mg total) by mouth 2 (two) times daily before a meal. 60 tablet 5  . metoprolol succinate (TOPROL-XL) 100 MG 24 hr tablet Take 1 tablet by mouth daily.Take with or immediately following  a meal. 30 tablet 5  . Multiple Vitamins-Minerals (MULTIVITAMIN PO) Take 1 tablet by mouth daily.    [name] omega-3 acid ethyl esters (LOVAZA) 1 G capsule Take 1 capsule (1 g total) by mouth 2 (two) times daily. 60 capsule 5  . omeprazole (PRILOSEC) 20 MG capsule Take 20 mg by mouth daily.  3  . oxycodone (ROXICODONE) 30 MG immediate release tablet Take 30 mg by mouth 4 (four) times daily.  0  . Psyllium (METAMUCIL PO) Take 2 scoop by mouth daily as needed.     . rosuvastatin (CRESTOR) 20 MG tablet     . sacubitril-valsartan (ENTRESTO) 49-51 MG Take 1 tablet by mouth 2 (two) times daily. 60 tablet 6  . sertraline (ZOLOFT) 100 MG tablet Take 100 mg by mouth daily.    [name] spironolactone (ALDACTONE) 25 MG tablet Take 1 tablet by mouth every other day. 15 tablet 3  . testosterone cypionate (DEPOTESTOTERONE CYPIONATE) 200 MG/ML injection Inject 100 mg into the muscle once a week. Saturdays.    [name] torsemide (DEMADEX) 20 MG tablet Take 2 tablets by mouth daily . 60 tablet 4   No current facility-administered medications for this visit.     Allergies:   Hydrocodone-acetaminophen    ROS:  Please see the history of present illness.   Otherwise, review of systems are positive for none.   All other systems are reviewed and negative.    PHYSICAL EXAM: VS:  Ht 5\' 10"  (1.778 m)   Wt 214 lb 3.2 oz (97.2 kg)   BMI 30.73 kg/m  , BMI Body mass index is 30.73 kg/m. GENERAL:  Well appearing NECK:  No jugular venous distention, waveform within normal limits, carotid upstroke brisk and symmetric, no bruits, no thyromegaly LUNGS:  Clear to auscultation bilaterally CHEST:  Unremarkable HEART:  PMI not displaced or sustained,S1 and S2 within normal limits, no S3, no S4, no clicks, no rubs, no murmurs ABD:  Flat, positive bowel sounds normal in frequency in pitch, no bruits, no rebound, no guarding, no midline pulsatile mass, no hepatomegaly, no splenomegaly EXT:  2 plus pulses throughout, no edema, no cyanosis no  clubbing    EKG:  EKG is ordered today. The ekg ordered today demonstrates sinus rhythm, rate 53, axis within normal limits, first-degree AV block, poor anterior R wave progression, low voltage on the limb leads, lateral T wave inversions.  No change from previous.   Recent Labs: No results found for requested labs within last 8760 hours.    Lipid Panel    Component Value Date/Time   CHOL 329 (H) 03/29/2013 1238   TRIG 410 (H) 05/26/2013 1443   HDL 21 (L) 03/29/2013 1238  CHOLHDL 15.7 03/29/2013 1238   VLDL NOT CALC 03/29/2013 1238   LDLCALC NOT CALC 03/29/2013 1238   LDLDIRECT 89 12/09/2012 1225      Wt Readings from Last 3 Encounters:  02/11/19 214 lb 3.2 oz (97.2 kg)  03/18/18 210 lb 12.8 oz (95.6 kg)  01/21/18 211 lb (95.7 kg)      Other studies Reviewed: Additional studies/ records that were reviewed today include: None. Review of the above records demonstrates:     ASSESSMENT AND PLAN:  Chronic systolic CHF -    He was not orthostatic today.  He is a having some abdominal bloating.  I agree iterated how he should be taking his medications and in particular that he should be taking the Entresto twice a day.  Again get blood work to include a BNP and a basic metabolic profile today.  He will need a repeat echocardiogram.  Essential HTN -     Hi none s blood pressure is being managed in the context of treating his heart failure.  CKD stage II -    His creatinine most recently was 1.19.  I will check a basic metabolic profile.    Mild nonobstructive CAD by cath 07/2014 -     No further work up at this time.   Alcohol abuse -   He still is not drinking.   Tobacco abuse -     We had talked about this several times in he did want to take Chantix.  This is his most difficult advice.    Current medicines are reviewed at length with the patient today.  The patient does not have concerns regarding medicines.  I did clarify as above  The following changes have been  made:  As above  Labs/ tests ordered today include:   Orders Placed This Encounter  Procedures  . Basic metabolic panel  . Pro b natriuretic peptide (BNP)[facility])  . EKG 12-Lead  . ECHOCARDIOGRAM COMPLETE     Disposition:   FU with APP in two months.     Signed, Minus Breeding, MD  02/11/2019 1:18 PM    [place]

## 2019-02-10 NOTE — Telephone Encounter (Signed)
New Message    Left message to confirm appt. Change to in office and answer covid questions

## 2019-02-10 NOTE — Telephone Encounter (Signed)
    COVID-19 Pre-Screening Questions:  . In the past 7 to 10 days have you had a cough,  shortness of breath, headache, congestion, fever (100 or greater) body aches, chills, sore throat, or sudden loss of taste or sense of smell? . Have you been around anyone with known Covid 19. . Have you been around anyone who is awaiting Covid 19 test results in the past 7 to 10 days? . Have you been around anyone who has been exposed to Covid 19, or has mentioned symptoms of Covid 19 within the past 7 to 10 days?  If you have any concerns/questions about symptoms patients report during screening (either on the phone or at threshold). Contact the provider seeing the patient or DOD for further guidance.  If neither are available contact a member of the leadership team.           PATIENT ANSWERED NO TO THE ABOVE QUESTIONS FOR APPT TOMORROW WITH DR [facility]

## 2019-02-11 ENCOUNTER — Other Ambulatory Visit: Payer: Self-pay

## 2019-02-11 ENCOUNTER — Ambulatory Visit (INDEPENDENT_AMBULATORY_CARE_PROVIDER_SITE_OTHER): Payer: Medicare Other | Admitting: Cardiology

## 2019-02-11 ENCOUNTER — Encounter: Payer: Self-pay | Admitting: Cardiology

## 2019-02-11 ENCOUNTER — Other Ambulatory Visit: Payer: Self-pay | Admitting: Cardiology

## 2019-02-11 VITALS — Ht 70.0 in | Wt 214.2 lb

## 2019-02-11 DIAGNOSIS — Z720 Tobacco use: Secondary | ICD-10-CM | POA: Diagnosis not present

## 2019-02-11 DIAGNOSIS — I10 Essential (primary) hypertension: Secondary | ICD-10-CM

## 2019-02-11 DIAGNOSIS — I5022 Chronic systolic (congestive) heart failure: Secondary | ICD-10-CM

## 2019-02-11 DIAGNOSIS — Z5181 Encounter for therapeutic drug level monitoring: Secondary | ICD-10-CM

## 2019-02-11 NOTE — Patient Instructions (Signed)
Medication Instructions:  TAKE YOUR ENTRESTO TWICE A DAY AS DIRECTED  If you need a refill on your cardiac medications before your next appointment, please call your pharmacy.   Lab work: BMET/BNP  If you have labs (blood work) drawn today and your tests are completely normal, you will receive your results only by: [name] MyChart Message (if you have MyChart) OR . A paper copy in the mail If you have any lab test that is abnormal or we need to change your treatment, we will call you to review the results.  Testing/Procedures: Your physician has requested that you have an echocardiogram. Echocardiography is a painless test that uses sound waves to create images of your heart. It provides your doctor with information about the size and shape of your heart and how well your heart's chambers and valves are working. This procedure takes approximately one hour. There are no restrictions for this procedure. [place] [address]   Follow-Up: At [facility], you and your health needs are our priority.  As part of our continuing mission to provide you with exceptional heart care, we have created designated Provider Care Teams.  These Care Teams include your primary Cardiologist (physician) and Advanced Practice Providers (APPs -  Physician Assistants and Nurse Practitioners) who all work together to provide you with the care you need, when you need it. You will need a follow up appointment in 2 months.    You WILL see or one of the following Advanced Practice Providers on your designated Care Team:   Rosaria Ferries, PA-C . Jory Sims, DNP, ANP  Any Other Special Instructions Will Be Listed Below (If Applicable).   Echocardiogram An echocardiogram is a procedure that uses painless sound waves (ultrasound) to produce an image of the heart. Images from an echocardiogram can provide important information about:  Signs of coronary artery disease (CAD).  Aneurysm detection.  An aneurysm is a weak or damaged part of an artery wall that bulges out from the normal force of blood pumping through the body.  Heart size and shape. Changes in the size or shape of the heart can be associated with certain conditions, including heart failure, aneurysm, and CAD.  Heart muscle function.  Heart valve function.  Signs of a past heart attack.  Fluid buildup around the heart.  Thickening of the heart muscle.  A tumor or infectious growth around the heart valves. Tell a health care provider about:  Any allergies you have.  All medicines you are taking, including vitamins, herbs, eye drops, creams, and over-the-counter medicines.  Any blood disorders you have.  Any surgeries you have had.  Any medical conditions you have.  Whether you are pregnant or may be pregnant. What are the risks? Generally, this is a safe procedure. However, problems may occur, including:  Allergic reaction to dye (contrast) that may be used during the procedure. What happens before the procedure? No specific preparation is needed. You may eat and drink normally. What happens during the procedure?   An IV tube may be inserted into one of your veins.  You may receive contrast through this tube. A contrast is an injection that improves the quality of the pictures from your heart.  A gel will be applied to your chest.  A wand-like tool (transducer) will be moved over your chest. The gel will help to transmit the sound waves from the transducer.  The sound waves will harmlessly bounce off of your heart to  allow the heart images to be captured in real-time motion. The images will be recorded on a computer. The procedure may vary among health care providers and hospitals. What happens after the procedure?  You may return to your normal, everyday life, including diet, activities, and medicines, unless your health care provider tells you not to do that. Summary  An echocardiogram is a  procedure that uses painless sound waves (ultrasound) to produce an image of the heart.  Images from an echocardiogram can provide important information about the size and shape of your heart, heart muscle function, heart valve function, and fluid buildup around your heart.  You do not need to do anything to prepare before this procedure. You may eat and drink normally.  After the echocardiogram is completed, you may return to your normal, everyday life, unless your health care provider tells you not to do that. This information is not intended to replace advice given to you by your health care provider. Make sure you discuss any questions you have with your health care provider. Document Released: 07/11/2000 Document Revised: 11/04/2018 Document Reviewed: 08/16/2016 Elsevier Patient Education  2020 [employer].

## 2019-02-12 LAB — BASIC METABOLIC PANEL
BUN/Creatinine Ratio: 17 (ref 9–20)
BUN: 22 mg/dL (ref 6–24)
CO2: 24 mmol/L (ref 20–29)
Calcium: 9.5 mg/dL (ref 8.7–10.2)
Chloride: 96 mmol/L (ref 96–106)
Creatinine, Ser: 1.28 mg/dL — ABNORMAL HIGH (ref 0.76–1.27)
GFR calc Af Amer: 71 mL/min/{1.73_m2} (ref >59)
GFR calc non Af Amer: 62 mL/min/{1.73_m2} (ref >59)
Glucose: 109 mg/dL — ABNORMAL HIGH (ref 65–99)
Potassium: 5 mmol/L (ref 3.5–5.2)
Sodium: 141 mmol/L (ref 134–144)

## 2019-02-12 LAB — PRO B NATRIURETIC PEPTIDE: NT-Pro BNP: 485 pg/mL — ABNORMAL HIGH (ref 0–210)

## 2019-02-16 ENCOUNTER — Other Ambulatory Visit: Payer: Self-pay

## 2019-02-16 ENCOUNTER — Ambulatory Visit (HOSPITAL_COMMUNITY): Payer: Medicare Other | Attending: Surgery

## 2019-02-16 DIAGNOSIS — I5022 Chronic systolic (congestive) heart failure: Secondary | ICD-10-CM | POA: Diagnosis not present

## 2019-02-16 DIAGNOSIS — Z5181 Encounter for therapeutic drug level monitoring: Secondary | ICD-10-CM | POA: Insufficient documentation

## 2019-02-22 ENCOUNTER — Telehealth: Payer: Self-pay | Admitting: General Surgery

## 2019-02-22 NOTE — Telephone Encounter (Signed)
Covid-19 screening questions   Do you now or have you had a fever in the last 14 days? no  Do you have any respiratory symptoms of shortness of breath or cough now or in the last 14 days? no  Do you have any family members or close contacts with diagnosed or suspected Covid-19 in the past 14 days? no  Have you been tested for Covid-19 and found to be positive? No  Patient and healthcare partner informed to wear a mask to the clinic. The patient verbalized understanding

## 2019-02-23 ENCOUNTER — Encounter: Payer: Self-pay | Admitting: Nurse Practitioner

## 2019-02-23 ENCOUNTER — Other Ambulatory Visit (INDEPENDENT_AMBULATORY_CARE_PROVIDER_SITE_OTHER): Payer: Medicare Other

## 2019-02-23 ENCOUNTER — Ambulatory Visit (INDEPENDENT_AMBULATORY_CARE_PROVIDER_SITE_OTHER): Payer: Medicare Other | Admitting: Nurse Practitioner

## 2019-02-23 VITALS — BP 118/80 | HR 60 | Temp 98.7°F | Ht 70.0 in | Wt 212.0 lb

## 2019-02-23 DIAGNOSIS — R945 Abnormal results of liver function studies: Secondary | ICD-10-CM

## 2019-02-23 DIAGNOSIS — R140 Abdominal distension (gaseous): Secondary | ICD-10-CM

## 2019-02-23 DIAGNOSIS — R1013 Epigastric pain: Secondary | ICD-10-CM

## 2019-02-23 DIAGNOSIS — R7989 Other specified abnormal findings of blood chemistry: Secondary | ICD-10-CM

## 2019-02-23 DIAGNOSIS — K219 Gastro-esophageal reflux disease without esophagitis: Secondary | ICD-10-CM | POA: Diagnosis not present

## 2019-02-23 LAB — CBC
HCT: 52 % (ref 39.0–52.0)
Hemoglobin: 16.8 g/dL (ref 13.0–17.0)
MCHC: 32.2 g/dL (ref 30.0–36.0)
MCV: 76.5 fl — ABNORMAL LOW (ref 78.0–100.0)
Platelets: 133 10*3/uL — ABNORMAL LOW (ref 150.0–400.0)
RBC: 6.8 Mil/uL — ABNORMAL HIGH (ref 4.22–5.81)
RDW: 14.3 % (ref 11.5–15.5)
WBC: 6.2 10*3/uL (ref 4.0–10.5)

## 2019-02-23 NOTE — Progress Notes (Signed)
Chief Complaint: Abdominal pain, bloating     IMPRESSION and PLAN:     3.  58 year old male with chronic (years) bloating.  He really has not tried to correlate the bloating with diet.  He also has stabbing epigastric pain present over the last few months.  He saw [place] in 2018 for epigastric pain but says this is different.  He has several episodes a week, they were unrelated to eating.  The epigastric pain radiates upward into both sides of his neck and ultimately into his head leaving him with a slight headache.  Drinking cold water helps.  His abdominal exam is unremarkable, weight is stable.  Not sure what to make of the epigastric pain radiating upward into his head however reviewing labs I see that he has new abnormalities in his liver test, see #2 -Patient will keep a food diary to see if he can make any correlation between the bloating and food.  This is basic but can be very helpful.  Reassured him that bloating is usually a benign process  2.  Abnormal liver tests.  In November 2018 his liver tests were normal.  Labs ordered by PCP 12/27/2018 remarkable for alkaline phosphatase of 210, AST 51, ALT 205.  Total bilirubin normal at 0.8.  Other than Vitamin D and Flexeril patient  has not started any new medication in the previous months.  -Repeat liver test today.  If still abnormal will need further workup and start with RUQ ultrasound  HPI:     Patient is a 58 yo male with dilated cardiomyopathy, EF improved to 40-45%, CKD, ETOH abuse (in remission) and HTN. Patient was seen here January 2018 for evaluation of chronic bloating, abdominal pain. excessive gas/burping.  His symptoms had been going on for "forever" . We arranged for EGD and colonoscopy which he had done in February 2018.  EGD remarkable for gastritis and gastroparesis.  Path compatible with reactive gastropathy.  He had a tiny tubular adenoma removed at time of colonoscopy which was otherwise a normal exam.   Jerome Adams has  returned with ongoing bloating and abdominal pain.  Though he has not been here in a couple years he says his symptoms have persisted.  He describes chronic bloating which sometimes affects his breathing. Has frequent flatus but flatus doesn't help the bloating. Can't correlate symptoms with dairy or other foods but feels symptoms are worse in postprandial state.  In addition to above, over the last few months,  he has developed intermittent stabbing epigastric pain which radiates upward into both sides of his neck and ultimately into his head leaving him with a slight headache.  Pain does not radiate through to his back.  He has having these episodes several times a week, they occur throughout the day and are not related to eating.  Denies NSAID use.  Drinking cold water helps.  This is not the same pain that he saw [place] for in 2018.  His weight has been stable.   Patient has a history of GERD.  His heartburn when he has it is usually worse at night.  He does take Prilosec 20 mg every day.  Sleeps on pillows.  Previous Endoscopies Feb 2018 EGD - gastritis and gastroparesis Colonoscopy - 2 mm polyp - TA  Diagnosis 1. Colon, polyp(s), cecal - TUBULAR ADENOMA. - NO HIGH GRADE DYSPLASIA OR MALIGNANCY. 2. Stomach, biopsy - MILD REACTIVE GASTROPATHY. - NEGATIVE FOR HELICOBACTER PYLORI. - NO INTESTINAL METAPLASIA, DYSPLASIA, OR  MALIGNANCY.  Review of systems:     No chest pain, no SOB, no fevers, no urinary sx   Past Medical History:  Diagnosis Date  . Alcohol abuse   . Arthritis    "joints" (03/30/2013)  . Chronic lower back pain   . Chronic pancreatitis ([place])   . CKD (chronic kidney disease), stage II   . COPD (chronic obstructive pulmonary disease) ([place]) 07/31/2014  . Depression   . Drug abuse ([place])    a. Remote drug abuse (cocaine). UDS 07/2014 neg for cocaine.  . Essential hypertension   . GERD (gastroesophageal reflux disease)    takes  otc  . Hepatitis C 2010   a. never treated -  undectable virus 12/2013.  [name] Hyperlipidemia   . Hypertriglyceridemia   . NICM (nonischemic cardiomyopathy) ([place])    a. 12/2007 Echo: EF 45-50%;  b. 07/2014 Echo: EF 20-25%, diff HK, mod dil LA, sev dil RA, mild TR, PASP 31 mmHg.  . PUD (peptic ulcer disease)   . Sleep apnea    was tested here at [facility].Marland KitchenMarland KitchenMarland KitchenNever heard anymore about it (03/30/2013)  . Spinal cord stimulator status    has had for 1 yr--inserted by [facility]  . Tobacco abuse   . Transaminitis     Patient's surgical history, family medical history, social history, medications and allergies were all reviewed in Epic   Serum creatinine: 1.28 mg/dL (H) 02/11/19 1253 Estimated creatinine clearance: 74.1 mL/min (A)  Current Outpatient Medications  Medication Sig Dispense Refill  . aspirin EC 81 MG EC tablet Take 1 tablet (81 mg total) by mouth daily. 30 tablet 0  . gemfibrozil (LOPID) 600 MG tablet Take 1 tablet (600 mg total) by mouth 2 (two) times daily before a meal. 60 tablet 5  . metoprolol succinate (TOPROL-XL) 100 MG 24 hr tablet Take 1 tablet by mouth daily.Take with or immediately following a meal. 30 tablet 5  . Multiple Vitamins-Minerals (MULTIVITAMIN PO) Take 1 tablet by mouth daily.    [name] omega-3 acid ethyl esters (LOVAZA) 1 G capsule Take 1 capsule (1 g total) by mouth 2 (two) times daily. 60 capsule 5  . omeprazole (PRILOSEC) 20 MG capsule Take 20 mg by mouth daily.  3  . oxycodone (ROXICODONE) 30 MG immediate release tablet Take 30 mg by mouth 4 (four) times daily.  0  . Psyllium (METAMUCIL PO) Take 2 scoop by mouth daily as needed.     . rosuvastatin (CRESTOR) 20 MG tablet     . sacubitril-valsartan (ENTRESTO) 49-51 MG Take 1 tablet by mouth 2 (two) times daily. 60 tablet 6  . sertraline (ZOLOFT) 100 MG tablet Take 100 mg by mouth daily.    [name] spironolactone (ALDACTONE) 25 MG tablet Take 1 tablet by mouth every other day. 15 tablet 3  . testosterone cypionate (DEPOTESTOTERONE CYPIONATE) 200 MG/ML injection Inject 100  mg into the muscle once a week. Saturdays.    [name] torsemide (DEMADEX) 20 MG tablet Take 2 tablets by mouth daily . 60 tablet 4   No current facility-administered medications for this visit.     Physical Exam:     BP 118/80 (BP Location: Left Arm, Patient Position: Sitting, Cuff Size: Normal)   Pulse 60   Temp 98.7 F (37.1 C)   Ht 5\' 10"  (1.778 m)   Wt 212 lb (96.2 kg)   BMI 30.42 kg/m   GENERAL:  Pleasant male in NAD PSYCH: : Cooperative, normal affect EENT:  conjunctiva pink, mucous membranes moist,  neck supple without masses CARDIAC:  RRR,  no peripheral edema PULM: Normal respiratory effort, lungs CTA bilaterally, no wheezing ABDOMEN:  Nondistended, soft, nontender. No obvious masses, no hepatomegaly,  normal bowel sounds SKIN:  turgor, no lesions seen Musculoskeletal:  Normal muscle tone, normal strength NEURO: Alert and oriented x 3, no focal neurologic deficits   Tye Savoy , NP 02/23/2019, 4:01 PM

## 2019-02-23 NOTE — Patient Instructions (Addendum)
If you are age 58 or older, your body mass index should be between 23-30. Your Body mass index is 30.42 kg/m. If this is out of the aforementioned range listed, please consider follow up with your Primary Care Provider.  If you are age 87 or younger, your body mass index should be between 19-25. Your Body mass index is 30.42 kg/m. If this is out of the aformentioned range listed, please consider follow up with your Primary Care Provider.   Your provider has requested that you go to the basement level for lab work before leaving today. Press "B" on the elevator. The lab is located at the first door on the left as you exit the elevator.  Use a wedge pillow.  You have been given GERD literature, and Low Gas Diet.  Follow up with me on  August 27, 20 at 3:30 pm.  Thank you for choosing me and [facility].   Tye Savoy, NP

## 2019-02-24 ENCOUNTER — Encounter: Payer: Self-pay | Admitting: Nurse Practitioner

## 2019-02-24 LAB — HEPATIC FUNCTION PANEL
ALT: 189 U/L — ABNORMAL HIGH (ref 0–53)
AST: 88 U/L — ABNORMAL HIGH (ref 0–37)
Albumin: 4.4 g/dL (ref 3.5–5.2)
Alkaline Phosphatase: 278 U/L — ABNORMAL HIGH (ref 39–117)
Bilirubin, Direct: 0.1 mg/dL (ref 0.0–0.3)
Total Bilirubin: 0.9 mg/dL (ref 0.2–1.2)
Total Protein: 7.8 g/dL (ref 6.0–8.3)

## 2019-02-24 LAB — LIPASE: Lipase: 45 U/L (ref 11.0–59.0)

## 2019-02-25 ENCOUNTER — Other Ambulatory Visit: Payer: Self-pay

## 2019-02-25 DIAGNOSIS — R945 Abnormal results of liver function studies: Secondary | ICD-10-CM

## 2019-02-25 DIAGNOSIS — R109 Unspecified abdominal pain: Secondary | ICD-10-CM

## 2019-02-25 DIAGNOSIS — R7989 Other specified abnormal findings of blood chemistry: Secondary | ICD-10-CM

## 2019-02-25 NOTE — Progress Notes (Signed)
I agree with the above note, plan 

## 2019-03-01 ENCOUNTER — Other Ambulatory Visit: Payer: Self-pay

## 2019-03-01 ENCOUNTER — Ambulatory Visit (HOSPITAL_COMMUNITY)
Admission: RE | Admit: 2019-03-01 | Discharge: 2019-03-01 | Disposition: A | Discharge status: Home / Self Care | Payer: Medicare Other | Source: Ambulatory Visit | Attending: Nurse Practitioner | Admitting: Nurse Practitioner

## 2019-03-01 DIAGNOSIS — R7989 Other specified abnormal findings of blood chemistry: Secondary | ICD-10-CM

## 2019-03-01 DIAGNOSIS — R945 Abnormal results of liver function studies: Secondary | ICD-10-CM | POA: Insufficient documentation

## 2019-03-02 ENCOUNTER — Other Ambulatory Visit (INDEPENDENT_AMBULATORY_CARE_PROVIDER_SITE_OTHER): Payer: Medicare Other

## 2019-03-02 DIAGNOSIS — R945 Abnormal results of liver function studies: Secondary | ICD-10-CM

## 2019-03-02 DIAGNOSIS — R7989 Other specified abnormal findings of blood chemistry: Secondary | ICD-10-CM

## 2019-03-02 DIAGNOSIS — R109 Unspecified abdominal pain: Secondary | ICD-10-CM

## 2019-03-02 LAB — HEPATIC FUNCTION PANEL
ALT: 66 U/L — ABNORMAL HIGH (ref 0–53)
AST: 32 U/L (ref 0–37)
Albumin: 4.5 g/dL (ref 3.5–5.2)
Alkaline Phosphatase: 227 U/L — ABNORMAL HIGH (ref 39–117)
Bilirubin, Direct: 0.2 mg/dL (ref 0.0–0.3)
Total Bilirubin: 0.7 mg/dL (ref 0.2–1.2)
Total Protein: 7.8 g/dL (ref 6.0–8.3)

## 2019-03-08 LAB — HCV RNA,QUANTITATIVE REAL TIME PCR
HCV Quantitative Log: <1.18 Log IU/mL
HCV RNA, PCR, QN: <15 IU/mL

## 2019-03-08 LAB — HEPATITIS C ANTIBODY
Hepatitis C Ab: REACTIVE — AB
SIGNAL TO CUT-OFF: 7.7 — ABNORMAL HIGH (ref <1.00)

## 2019-03-08 LAB — HEPATITIS B CORE ANTIBODY, TOTAL: Hep B Core Total Ab: NONREACTIVE

## 2019-03-08 LAB — HEPATITIS B SURFACE ANTIGEN: Hepatitis B Surface Ag: NONREACTIVE

## 2019-03-08 LAB — HEPATITIS B SURFACE ANTIBODY,QUALITATIVE: Hep B S Ab: NONREACTIVE

## 2019-03-11 ENCOUNTER — Other Ambulatory Visit: Payer: Self-pay

## 2019-03-11 DIAGNOSIS — R945 Abnormal results of liver function studies: Secondary | ICD-10-CM

## 2019-03-11 DIAGNOSIS — R7989 Other specified abnormal findings of blood chemistry: Secondary | ICD-10-CM

## 2019-03-11 DIAGNOSIS — K838 Other specified diseases of biliary tract: Secondary | ICD-10-CM

## 2019-03-13 ENCOUNTER — Other Ambulatory Visit: Payer: Self-pay | Admitting: Cardiology

## 2019-03-24 ENCOUNTER — Ambulatory Visit: Payer: Medicare Other | Admitting: Nurse Practitioner

## 2019-03-28 ENCOUNTER — Other Ambulatory Visit (HOSPITAL_COMMUNITY)
Admission: RE | Admit: 2019-03-28 | Discharge: 2019-03-28 | Disposition: A | Discharge status: Home / Self Care | Payer: Medicare Other | Source: Ambulatory Visit | Attending: Gastroenterology | Admitting: Gastroenterology

## 2019-03-28 DIAGNOSIS — Z20828 Contact with and (suspected) exposure to other viral communicable diseases: Secondary | ICD-10-CM | POA: Diagnosis not present

## 2019-03-28 DIAGNOSIS — Z01812 Encounter for preprocedural laboratory examination: Secondary | ICD-10-CM | POA: Diagnosis present

## 2019-03-28 LAB — SARS CORONAVIRUS 2 (TAT 6-24 HRS): SARS Coronavirus 2: NEGATIVE

## 2019-03-30 NOTE — Progress Notes (Signed)
Spoke with patient, states has quarantined since covid 19 testing without symptoms, patient aware to be NPO p MN, arrival time 0800.  Answered questions regarding procedure.

## 2019-03-31 ENCOUNTER — Ambulatory Visit (HOSPITAL_COMMUNITY): Payer: Medicare Other | Admitting: Anesthesiology

## 2019-03-31 ENCOUNTER — Encounter (HOSPITAL_COMMUNITY): Payer: Self-pay | Admitting: *Deleted

## 2019-03-31 ENCOUNTER — Other Ambulatory Visit: Payer: Self-pay

## 2019-03-31 ENCOUNTER — Encounter (HOSPITAL_COMMUNITY)
Admission: RE | Disposition: A | Discharge status: Home / Self Care | Payer: Self-pay | Source: Home / Self Care | Attending: Gastroenterology

## 2019-03-31 ENCOUNTER — Ambulatory Visit (HOSPITAL_COMMUNITY)
Admission: RE | Admit: 2019-03-31 | Discharge: 2019-03-31 | Disposition: A | Discharge status: Home / Self Care | Payer: Medicare Other | Attending: Gastroenterology | Admitting: Gastroenterology

## 2019-03-31 DIAGNOSIS — Z7982 Long term (current) use of aspirin: Secondary | ICD-10-CM | POA: Insufficient documentation

## 2019-03-31 DIAGNOSIS — K2970 Gastritis, unspecified, without bleeding: Secondary | ICD-10-CM | POA: Diagnosis not present

## 2019-03-31 DIAGNOSIS — K319 Disease of stomach and duodenum, unspecified: Secondary | ICD-10-CM | POA: Diagnosis not present

## 2019-03-31 DIAGNOSIS — R109 Unspecified abdominal pain: Secondary | ICD-10-CM | POA: Insufficient documentation

## 2019-03-31 DIAGNOSIS — N182 Chronic kidney disease, stage 2 (mild): Secondary | ICD-10-CM | POA: Diagnosis not present

## 2019-03-31 DIAGNOSIS — Z79899 Other long term (current) drug therapy: Secondary | ICD-10-CM | POA: Insufficient documentation

## 2019-03-31 DIAGNOSIS — R7989 Other specified abnormal findings of blood chemistry: Secondary | ICD-10-CM

## 2019-03-31 DIAGNOSIS — Z8711 Personal history of peptic ulcer disease: Secondary | ICD-10-CM | POA: Diagnosis not present

## 2019-03-31 DIAGNOSIS — E781 Pure hyperglyceridemia: Secondary | ICD-10-CM | POA: Diagnosis not present

## 2019-03-31 DIAGNOSIS — M545 Low back pain: Secondary | ICD-10-CM | POA: Diagnosis not present

## 2019-03-31 DIAGNOSIS — G8929 Other chronic pain: Secondary | ICD-10-CM | POA: Insufficient documentation

## 2019-03-31 DIAGNOSIS — D490 Neoplasm of unspecified behavior of digestive system: Secondary | ICD-10-CM

## 2019-03-31 DIAGNOSIS — I428 Other cardiomyopathies: Secondary | ICD-10-CM | POA: Diagnosis not present

## 2019-03-31 DIAGNOSIS — Z9682 Presence of neurostimulator: Secondary | ICD-10-CM | POA: Diagnosis not present

## 2019-03-31 DIAGNOSIS — I129 Hypertensive chronic kidney disease with stage 1 through stage 4 chronic kidney disease, or unspecified chronic kidney disease: Secondary | ICD-10-CM | POA: Diagnosis not present

## 2019-03-31 DIAGNOSIS — R945 Abnormal results of liver function studies: Secondary | ICD-10-CM

## 2019-03-31 DIAGNOSIS — J449 Chronic obstructive pulmonary disease, unspecified: Secondary | ICD-10-CM | POA: Diagnosis not present

## 2019-03-31 DIAGNOSIS — E785 Hyperlipidemia, unspecified: Secondary | ICD-10-CM | POA: Diagnosis not present

## 2019-03-31 DIAGNOSIS — G4730 Sleep apnea, unspecified: Secondary | ICD-10-CM | POA: Insufficient documentation

## 2019-03-31 DIAGNOSIS — F17200 Nicotine dependence, unspecified, uncomplicated: Secondary | ICD-10-CM | POA: Diagnosis not present

## 2019-03-31 DIAGNOSIS — K3189 Other diseases of stomach and duodenum: Secondary | ICD-10-CM

## 2019-03-31 DIAGNOSIS — D132 Benign neoplasm of duodenum: Secondary | ICD-10-CM | POA: Insufficient documentation

## 2019-03-31 DIAGNOSIS — K838 Other specified diseases of biliary tract: Secondary | ICD-10-CM

## 2019-03-31 DIAGNOSIS — F329 Major depressive disorder, single episode, unspecified: Secondary | ICD-10-CM | POA: Insufficient documentation

## 2019-03-31 DIAGNOSIS — K219 Gastro-esophageal reflux disease without esophagitis: Secondary | ICD-10-CM | POA: Insufficient documentation

## 2019-03-31 HISTORY — PX: EUS: SHX5427

## 2019-03-31 HISTORY — PX: ESOPHAGOGASTRODUODENOSCOPY (EGD) WITH PROPOFOL: SHX5813

## 2019-03-31 HISTORY — PX: BIOPSY: SHX5522

## 2019-03-31 SURGERY — UPPER ENDOSCOPIC ULTRASOUND (EUS) RADIAL
Anesthesia: Monitor Anesthesia Care

## 2019-03-31 MED ORDER — ONDANSETRON HCL 4 MG/2ML IJ SOLN
INTRAMUSCULAR | Status: DC | PRN
  Administered 2019-03-31: 4 mg via INTRAVENOUS

## 2019-03-31 MED ORDER — PROPOFOL 10 MG/ML IV BOLUS
INTRAVENOUS | Status: DC | PRN
  Administered 2019-03-31: 30 mg via INTRAVENOUS

## 2019-03-31 MED ORDER — PROPOFOL 10 MG/ML IV BOLUS
INTRAVENOUS | Status: AC
  Filled 2019-03-31: qty 40

## 2019-03-31 MED ORDER — SODIUM CHLORIDE 0.9 % IV SOLN: INTRAVENOUS | Status: DC

## 2019-03-31 MED ORDER — PROPOFOL 500 MG/50ML IV EMUL
INTRAVENOUS | Status: DC | PRN
  Administered 2019-03-31: 125 ug/kg/min via INTRAVENOUS

## 2019-03-31 MED ORDER — PROPOFOL 10 MG/ML IV BOLUS
INTRAVENOUS | Status: AC
  Filled 2019-03-31: qty 20

## 2019-03-31 MED ORDER — LACTATED RINGERS IV SOLN
INTRAVENOUS | Status: DC
  Administered 2019-03-31: 1000 mL via INTRAVENOUS

## 2019-03-31 MED ORDER — LIDOCAINE 2% (20 MG/ML) 5 ML SYRINGE
INTRAMUSCULAR | Status: DC | PRN
  Administered 2019-03-31: 60 mg via INTRAVENOUS

## 2019-03-31 NOTE — Anesthesia Procedure Notes (Signed)
Procedure Name: MAC Date/Time: 03/31/2019 9:36 AM Performed by: Maxwell Caul, CRNA Pre-anesthesia Checklist: Patient identified, Emergency Drugs available, Suction available, Patient being monitored and Timeout performed Patient Re-evaluated:Patient Re-evaluated prior to induction Oxygen Delivery Method: Nasal cannula

## 2019-03-31 NOTE — Transfer of Care (Signed)
Immediate Anesthesia Transfer of Care Note  Patient: Jerome Adams  Procedure(s) Performed: UPPER ENDOSCOPIC ULTRASOUND (EUS) RADIAL (N/A ) BIOPSY  Patient Location: PACU  Anesthesia Type:MAC  Level of Consciousness: awake, alert  and oriented  Airway & Oxygen Therapy: Patient Spontanous Breathing and Patient connected to nasal cannula oxygen  Post-op Assessment: Report given to RN and Post -op Vital signs reviewed and stable  Post vital signs: Reviewed and stable  Last Vitals:  Vitals Value Taken Time  BP 115/74 03/31/19 1030  Temp    Pulse 61 03/31/19 1035  Resp 13 03/31/19 1035  SpO2 99 % 03/31/19 1035  Vitals shown include unvalidated device data.  Last Pain:  Vitals:   03/31/19 1022  TempSrc:   PainSc: 0-No pain      Patients Stated Pain Goal: 3 (65/68/12 7517)  Complications: No apparent anesthesia complications

## 2019-03-31 NOTE — Op Note (Addendum)
[facility] Patient Name: Jerome Adams Procedure Date: 03/31/2019 MRN: [id number] Attending MD: Milus Banister , MD Date of Birth: 26-Jan-1961 CSN: [id number] Age: 58 Admit Type: Outpatient Procedure:                Upper EUS Indications:              intermittent abdominal pains, elevated liver tests,                            dilated CBD on [place] (normal bilirubin but alk phos in                            the 200s with waxing/waning ALT/AST) Providers:                Milus Banister, MD, Cleda Daub, RN, William Dalton, Technician Referring MD:              Medicines:                Monitored Anesthesia Care Complications:            No immediate complications. Estimated blood loss:                            None. Estimated Blood Loss:     Estimated blood loss: none. Procedure:                Pre-Anesthesia Assessment:                           - Prior to the procedure, a History and Physical                            was performed, and patient medications and                            allergies were reviewed. The patient's tolerance of                            previous anesthesia was also reviewed. The risks                            and benefits of the procedure and the sedation                            options and risks were discussed with the patient.                            All questions were answered, and informed consent                            was obtained. Prior Anticoagulants: The patient has  taken no previous anticoagulant or antiplatelet                            agents. ASA Grade Assessment: II - A patient with                            mild systemic disease. After reviewing the risks                            and benefits, the patient was deemed in                            satisfactory condition to undergo the procedure.                           After obtaining informed  consent, the endoscope was                            passed under direct vision. Throughout the                            procedure, the patient's blood pressure, pulse, and                            oxygen saturations were monitored continuously. The                            [id number] ([id number]) Olympus Radial EUS was                            introduced through the mouth, and advanced to the                            second part of duodenum. The [id number] ([id number])                            Olympus duodenoscope was introduced through the                            mouth, and advanced to the second part of duodenum.                            The upper EUS was accomplished without difficulty.                            The patient tolerated the procedure well. Scope In: Scope Out: Findings:      ENDOSCOPIC FINDING: :      1. Normal esophagus.      2. Mild distal gastritis, sampled with biopsy. Small amount of retained       solid food without anatomic gastric outlet obstruction.      3. Abnormal major papilla (viewed with side viewing duodenoscope):       nodular and somewhat neoplastic appearing however not firm. The major  papilla measured 1.5cm across without distinct borders between normal       and abnormal mucosa. See images. The site was extensively biospied.      ENDOSONOGRAPHIC FINDING (with radial echoendoscope): :      1. Generous mucosa, deep mucosa layers measuring 1.3cm across at the       major papilla without sign of discrete mass. The muscularis propria       layer was intact.      2. Dilated CBD (1.1cm) that tapered smoothly into the major papilla. No       CBD stones.      3. Dilated cystic duct.      4. Normal gallbladder.      5. No periportal adenopathy.      6. Limited views of the liver, spleen, portal and splenic vessels were       all normal. 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 The                            major papilla was nodular, bulbous, somewhat                            neoplastic appearing but not firm. By EUS the                            ampulla has non-specifically generous mucosal and                            deep mucosal layers without discrete mass and                            clearly the muscularis propria is normal at the                            site. I sampled the major papilla with extensive                            biopsies.                           - Mild gastritis, biopsied to check for H. pylori.                           - Small amount of retained solid and liquid food                            without anatomic outlet obstruction. Moderate Sedation:      Not Applicable - Patient had care per Anesthesia. Recommendation:           - Discharge patient to home (ambulatory).                           -  Await final pathology results. He may need                            consideration for resection (endoscopic vs.                            surgical) Procedure Code(s):        --- Professional ---                           [phone], Esophagogastroduodenoscopy, flexible,                            transoral; with endoscopic ultrasound examination                            limited to the esophagus, stomach or duodenum, and                            adjacent structures                           43239, Esophagogastroduodenoscopy, flexible,                            transoral; with biopsy, single or multiple Diagnosis Code(s):        --- Professional ---                           K29.70, Gastritis, unspecified, without bleeding                           K31.89, Other diseases of stomach and duodenum CPT copyright 2019 American Medical Association. All rights reserved. The codes documented in this report are preliminary and upon coder review may  be revised to meet current compliance  requirements. Milus Banister, MD 03/31/2019 10:34:01 AM This report has been signed electronically. Number of Addenda: 0

## 2019-03-31 NOTE — Anesthesia Postprocedure Evaluation (Addendum)
Anesthesia Post Note  Patient: Jerome Adams  Procedure(s) Performed: UPPER ENDOSCOPIC ULTRASOUND (EUS) RADIAL (N/A ) BIOPSY     Patient location during evaluation: Endoscopy Anesthesia Type: MAC Level of consciousness: awake and alert, oriented and patient cooperative Pain management: pain level controlled Vital Signs Assessment: post-procedure vital signs reviewed and stable Respiratory status: spontaneous breathing, nonlabored ventilation and respiratory function stable Cardiovascular status: blood pressure returned to baseline and stable Postop Assessment: no apparent nausea or vomiting Anesthetic complications: no    Last Vitals:  Vitals:   03/31/19 1022 03/31/19 1030  BP: 114/64 115/74  Pulse:  61  Resp: 17 16  Temp:    SpO2: 97% 99%    Last Pain:  Vitals:   03/31/19 1022  TempSrc:   PainSc: 0-No pain                 Polina Burmaster,E. Edrei Norgaard

## 2019-03-31 NOTE — Discharge Instructions (Signed)
YOU HAD AN ENDOSCOPIC PROCEDURE TODAY: Refer to the procedure report and other information in the discharge instructions given to you for any specific questions about what was found during the examination. If this information does not answer your questions, please call [place] office at [phone] to clarify.   YOU SHOULD EXPECT: Some feelings of bloating in the abdomen. Passage of more gas than usual. Walking can help get rid of the air that was put into your GI tract during the procedure and reduce the bloating. If you had a lower endoscopy (such as a colonoscopy or flexible sigmoidoscopy) you may notice spotting of blood in your stool or on the toilet paper. Some abdominal soreness may be present for a day or two, also.  DIET: Your first meal following the procedure should be a light meal and then it is ok to progress to your normal diet. A half-sandwich or bowl of soup is an example of a good first meal. Heavy or fried foods are harder to digest and may make you feel nauseous or bloated. Drink plenty of fluids but you should avoid alcoholic beverages for 24 hours. If you had a esophageal dilation, please see attached instructions for diet.    ACTIVITY: Your care partner should take you home directly after the procedure. You should plan to take it easy, moving slowly for the rest of the day. You can resume normal activity the day after the procedure however YOU SHOULD NOT DRIVE, use power tools, machinery or perform tasks that involve climbing or major physical exertion for 24 hours (because of the sedation medicines used during the test).   SYMPTOMS TO REPORT IMMEDIATELY: A gastroenterologist can be reached at any hour. Please call [phone]  for any of the following symptoms:   Following upper endoscopy (EGD, EUS, ERCP, esophageal dilation) Vomiting of blood or coffee ground material  New, significant abdominal pain  New, significant chest pain or pain under the shoulder blades  Painful or  persistently difficult swallowing  New shortness of breath  Black, tarry-looking or red, bloody stools  FOLLOW UP:  If any biopsies were taken you will be contacted by phone or by letter within the next 1-3 weeks. Call [phone]  if you have not heard about the biopsies in 3 weeks.  Please also call with any specific questions about appointments or follow up tests.  

## 2019-03-31 NOTE — H&P (Signed)
HPI: This is a man with elevated liver tests, slightly dilated CBD  Chief complaint is elevated liver tests, slightly dilated CBD  ROS: complete GI ROS as described in HPI, all other review negative.  Constitutional:  No unintentional weight loss   Past Medical History:  Diagnosis Date  . Alcohol abuse   . Arthritis    "joints" (03/30/2013)  . Chronic lower back pain   . Chronic pancreatitis ([place])   . CKD (chronic kidney disease), stage II   . COPD (chronic obstructive pulmonary disease) ([place]) 07/31/2014  . Depression   . Drug abuse ([place])    a. Remote drug abuse (cocaine). UDS 07/2014 neg for cocaine.  . Essential hypertension   . GERD (gastroesophageal reflux disease)    takes  otc  . Hepatitis C 2010   a. never treated - undectable virus 12/2013.  [name] Hyperlipidemia   . Hypertriglyceridemia   . NICM (nonischemic cardiomyopathy) ([place])    a. 12/2007 Echo: EF 45-50%;  b. 07/2014 Echo: EF 20-25%, diff HK, mod dil LA, sev dil RA, mild TR, PASP 31 mmHg.  . PUD (peptic ulcer disease)   . Sleep apnea    was [facility] at [facility].Marland KitchenMarland KitchenMarland KitchenNever heard anymore about it (03/30/2013)  . Spinal cord stimulator status    has had for 1 yr--inserted by [facility]  . Tobacco abuse   . Transaminitis     Past Surgical History:  Procedure Laterality Date  . ANTERIOR CERVICAL DECOMP/DISCECTOMY FUSION  04/05/2012   Procedure: ANTERIOR CERVICAL DECOMPRESSION/DISCECTOMY FUSION 1 LEVEL;  Surgeon: Elaina Hoops, MD;  Location: [place] [facility];  Service: Neurosurgery;  Laterality: N/A;  Cervical five-six anterior cervical decompression and fusion  . BACK SURGERY    . COLONOSCOPY WITH PROPOFOL N/A 08/28/2016   Procedure: COLONOSCOPY WITH PROPOFOL;  Surgeon: Milus Banister, MD;  Location: [facility];  Service: Endoscopy;  Laterality: N/A;  . ESOPHAGOGASTRODUODENOSCOPY (EGD) WITH PROPOFOL N/A 08/28/2016   Procedure: ESOPHAGOGASTRODUODENOSCOPY (EGD) WITH PROPOFOL;  Surgeon: Milus Banister, MD;  Location: [facility];   Service: Endoscopy;  Laterality: N/A;  . FOOT SURGERY Right 2012?   "took piece off that was hurting me" (03/30/2013)  . HARDWARE REMOVAL     "took screw out of my back; it had damaged my nerve" (03/30/2013)  . INCISION AND DRAINAGE ABSCESS Left    "lower jaw, tooth abscess" (03/30/2013)  . LEFT HEART CATHETERIZATION WITH CORONARY ANGIOGRAM N/A 08/03/2014   Procedure: LEFT HEART CATHETERIZATION WITH CORONARY ANGIOGRAM;  Surgeon: Blane Ohara, MD;  Location: [facility] CATH LAB;  Service: Cardiovascular;  Laterality: N/A;  . POSTERIOR LUMBAR FUSION     "L4-5" (03/31/2103)  . SPINAL CORD STIMULATOR IMPLANT  11/14/2012  . WRIST SURGERY Right 1980's   repair of tendons and nerves    Current Facility-Administered Medications  Medication Dose Route Frequency Provider Last Rate Last Dose  . 0.9 %  sodium chloride infusion   Intravenous Continuous Milus Banister, MD      . lactated ringers infusion   Intravenous Continuous Milus Banister, MD        Allergies as of 03/11/2019 - Review Complete 02/24/2019  Allergen Reaction Noted  . Hydrocodone-acetaminophen Itching     Family History  Problem Relation Age of Onset  . Heart disease Father        H/o MI followed by SCD @ 15.  [name] Heart disease Sister        Died suddenly @ 80 - h/o CHF s/p AICD.  [name]  Breast cancer Sister   . Bone cancer Mother   . Esophageal cancer Neg Hx   . Stomach cancer Neg Hx     Social History   Socioeconomic History  . Marital status: Divorced    Spouse name: Not on file  . Number of children: 1  . Years of education: Not on file  . Highest education level: Not on file  Occupational History  . Occupation: UNEMPLOYEED    Employer: UNEMPLOYED    Comment: on disability.   Social Needs  . Financial resource strain: Not on file  . Food insecurity    Worry: Not on file    Inability: Not on file  . Transportation needs    Medical: Not on file    Non-medical: Not on file  Tobacco Use  . Smoking status: Current Some  Day Smoker    Packs/day: 0.50    Years: 40.00    Pack years: 20.00    Types: Cigarettes  . Smokeless tobacco: Never Used  Substance and Sexual Activity  . Alcohol use: Yes    Alcohol/week: 4.0 standard drinks    Types: 4 Cans of beer per week    Comment: Has been drinking since age 82.  Has averaged 4 22 oz cans of beer daily until late 2015.  . Drug use: No    Types: Cocaine    Comment: 03/30/2013 "[place]"  . Sexual activity: Not on file  Lifestyle  . Physical activity    Days per week: Not on file    Minutes per session: Not on file  . Stress: Not on file  Relationships  . Social Herbalist on phone: Not on file    Gets together: Not on file    Attends religious service: Not on file    Active member of club or organization: Not on file    Attends meetings of clubs or organizations: Not on file    Relationship status: Not on file  . Intimate partner violence    Fear of current or ex partner: Not on file    Emotionally abused: Not on file    Physically abused: Not on file    Forced sexual activity: Not on file  Other Topics Concern  . Not on file  Social History Narrative   Lives in [place] by himself.  Does not routinely exercise.     Physical Exam: There were no vitals taken for this visit. Constitutional: generally well-appearing Psychiatric: alert and oriented x3 Abdomen: soft, nontender, nondistended, no obvious ascites, no peritoneal signs, normal bowel sounds No peripheral edema noted in lower extremities  Assessment and plan: 58 y.o. male with elevated liver tests, dilated cBD  For upper EUS today.  Please see the "Patient Instructions" section for addition details about the plan.  Owens Loffler, MD [facility] 03/31/2019, 8:48 AM

## 2019-03-31 NOTE — Anesthesia Preprocedure Evaluation (Addendum)
Anesthesia Evaluation  Patient identified by MRN, date of birth, ID band Patient awake    Reviewed: Allergy & Precautions, NPO status , Patient's Chart, lab work & pertinent test results  History of Anesthesia Complications Negative for: history of anesthetic complications  Airway Mallampati: I  TM Distance: >3 FB Neck ROM: Full    Dental  (+) Edentulous Upper   Pulmonary sleep apnea , COPD, Current SmokerPatient did not abstain from smoking.,  03/28/2019 SARS coronavirus NEG   breath sounds clear to auscultation       Cardiovascular hypertension, Pt. on medications and Pt. on home beta blockers  Rhythm:Regular Rate:Normal  01/2019 ECHO: EF 40-45%, valves OK '16 stress:  No reversible ischemia or infarction. The inferior wall abnormality is most likely due to diaphragmatic attenuation artifact, Severe left ventricular diffuse hypokinesis. EF 24%    Neuro/Psych Spinal cord stimulator    GI/Hepatic GERD  Medicated,(+)     substance abuse  alcohol use and cocaine use, Hepatitis -, C  Endo/Other    Renal/GU      Musculoskeletal   Abdominal   Peds  Hematology   Anesthesia Other Findings   Reproductive/Obstetrics                            Anesthesia Physical Anesthesia Plan  ASA: III  Anesthesia Plan: MAC   Post-op Pain Management:    Induction:   PONV Risk Score and Plan: 0 and Treatment may vary due to age or medical condition  Airway Management Planned: Natural Airway and Nasal Cannula  Additional Equipment:   Intra-op Plan:   Post-operative Plan:   Informed Consent: I have reviewed the patients History and Physical, chart, labs and discussed the procedure including the risks, benefits and alternatives for the proposed anesthesia with the patient or authorized representative who has indicated his/her understanding and acceptance.     Dental advisory given  Plan Discussed  with: CRNA and Surgeon  Anesthesia Plan Comments:        Anesthesia Quick Evaluation

## 2019-04-01 ENCOUNTER — Encounter (HOSPITAL_COMMUNITY): Payer: Self-pay | Admitting: Gastroenterology

## 2019-04-06 ENCOUNTER — Other Ambulatory Visit: Payer: Self-pay

## 2019-04-06 DIAGNOSIS — R7989 Other specified abnormal findings of blood chemistry: Secondary | ICD-10-CM

## 2019-04-06 DIAGNOSIS — R945 Abnormal results of liver function studies: Secondary | ICD-10-CM

## 2019-04-07 ENCOUNTER — Other Ambulatory Visit: Payer: Self-pay

## 2019-04-07 DIAGNOSIS — R945 Abnormal results of liver function studies: Secondary | ICD-10-CM

## 2019-04-07 DIAGNOSIS — Q453 Other congenital malformations of pancreas and pancreatic duct: Secondary | ICD-10-CM

## 2019-04-07 DIAGNOSIS — R7989 Other specified abnormal findings of blood chemistry: Secondary | ICD-10-CM

## 2019-04-07 DIAGNOSIS — R1013 Epigastric pain: Secondary | ICD-10-CM

## 2019-04-07 DIAGNOSIS — R109 Unspecified abdominal pain: Secondary | ICD-10-CM

## 2019-04-07 DIAGNOSIS — K838 Other specified diseases of biliary tract: Secondary | ICD-10-CM

## 2019-04-11 NOTE — Progress Notes (Deleted)
Cardiology Office Note   Date:  04/11/2019   ID:  Jerome Adams, DOB [birth year], MRN [id number]  PCP:  Antonietta Jewel, MD  Cardiologist:  Dr. Percival Spanish     History of Present Illness: Jerome Adams is a 58 y.o. male who presents for ongoing assessment and management of dilated cardiomyopathy, etiology likely to EtOH abuse, with EF of 20 to 25% in 2016, improved to 35% to 40% in 2018.  He was last seen in the office on 02/11/2019 by Dr. Percival Spanish with complaints of feeling bloated and lightheaded.  He had not gained any significant weight.  He was having trouble with compliance on Entresto forgetting to take the evening dose.  Echocardiogram was ordered along with BNP and BMET.  He was advised to take Entresto twice daily as directed.  Echocardiogram dated 02/16/2019 revealed an EF of 40% to 45%, parameters consistent with impaired relaxation.  RV was mildly enlarged.  Left atrial size was severely dilated right atrial size was severely dilated.  BNP was slightly elevated and therefore he was advised to take extra doses of torsemide 20 mg for 4 days.     Past Medical History:  Diagnosis Date  . Alcohol abuse   . Arthritis    "joints" (03/30/2013)  . Chronic lower back pain   . Chronic pancreatitis ([place])   . CKD (chronic kidney disease), stage II   . COPD (chronic obstructive pulmonary disease) ([place]) 07/31/2014  . Depression   . Drug abuse ([place])    a. Remote drug abuse (cocaine). UDS 07/2014 neg for cocaine.  . Essential hypertension   . GERD (gastroesophageal reflux disease)    takes  otc  . Hepatitis C 2010   a. never treated - undectable virus 12/2013.  [name] Hyperlipidemia   . Hypertriglyceridemia   . NICM (nonischemic cardiomyopathy) ([place])    a. 12/2007 Echo: EF 45-50%;  b. 07/2014 Echo: EF 20-25%, diff HK, mod dil LA, sev dil RA, mild TR, PASP 31 mmHg.  . PUD (peptic ulcer disease)   . Sleep apnea    was tested here at [facility]Marland KitchenMarland KitchenMarland KitchenNever heard anymore about it (03/30/2013)  . Spinal cord  stimulator status    has had for 1 yr--inserted by [facility]  . Tobacco abuse   . Transaminitis     Past Surgical History:  Procedure Laterality Date  . ANTERIOR CERVICAL DECOMP/DISCECTOMY FUSION  04/05/2012   Procedure: ANTERIOR CERVICAL DECOMPRESSION/DISCECTOMY FUSION 1 LEVEL;  Surgeon: Elaina Hoops, MD;  Location: [place] [facility];  Service: Neurosurgery;  Laterality: N/A;  Cervical five-six anterior cervical decompression and fusion  . BACK SURGERY    . BIOPSY  03/31/2019   Procedure: BIOPSY;  Surgeon: Milus Banister, MD;  Location: [facility];  Service: Endoscopy;;  . COLONOSCOPY WITH PROPOFOL N/A 08/28/2016   Procedure: COLONOSCOPY WITH PROPOFOL;  Surgeon: Milus Banister, MD;  [facility];  Service: Endoscopy;  Laterality: N/A;  . ESOPHAGOGASTRODUODENOSCOPY (EGD) WITH PROPOFOL N/A 08/28/2016   Procedure: ESOPHAGOGASTRODUODENOSCOPY (EGD) WITH PROPOFOL;  Surgeon: Milus Banister, MD;  [facility];  Service: Endoscopy;  Laterality: N/A;  . ESOPHAGOGASTRODUODENOSCOPY (EGD) WITH PROPOFOL N/A 03/31/2019   Procedure: ESOPHAGOGASTRODUODENOSCOPY (EGD) WITH PROPOFOL;  Surgeon: Milus Banister, MD;  [facility];  Service: Endoscopy;  Laterality: N/A;  . EUS N/A 03/31/2019   Procedure: UPPER ENDOSCOPIC ULTRASOUND (EUS) RADIAL;  Surgeon: Milus Banister, MD;  [facility]: [facility];  Service: Endoscopy;  Laterality: N/A;  . FOOT SURGERY Right 2012?   "  took piece off that was hurting me" (03/30/2013)  . HARDWARE REMOVAL     "took screw out of my back; it had damaged my nerve" (03/30/2013)  . INCISION AND DRAINAGE ABSCESS Left    "lower jaw, tooth abscess" (03/30/2013)  . LEFT HEART CATHETERIZATION WITH CORONARY ANGIOGRAM N/A 08/03/2014   Procedure: LEFT HEART CATHETERIZATION WITH CORONARY ANGIOGRAM;  Surgeon: Blane Ohara, MD;  Location: [facility] CATH LAB;  Service: Cardiovascular;  Laterality: N/A;  . POSTERIOR LUMBAR FUSION     "L4-5" (03/31/2103)  . SPINAL CORD STIMULATOR  IMPLANT  11/14/2012  . WRIST SURGERY Right 1980's   repair of tendons and nerves     Current Outpatient Medications  Medication Sig Dispense Refill  . aspirin EC 81 MG EC tablet Take 1 tablet (81 mg total) by mouth daily. 30 tablet 0  . ENTRESTO 49-51 MG Take 1 tablet by mouth twice daily. (Patient taking differently: Take 1 tablet by mouth 2 (two) times daily. ) 60 tablet 5  . gemfibrozil (LOPID) 600 MG tablet Take 1 tablet (600 mg total) by mouth 2 (two) times daily before a meal. 60 tablet 5  . metoprolol succinate (TOPROL-XL) 100 MG 24 hr tablet Take 1 tablet by mouth daily.***Take with or immediately following a meal.*** 30 tablet 5  . Multiple Vitamins-Minerals (MULTIVITAMIN PO) Take 1 tablet by mouth daily.    [name] omega-3 acid ethyl esters (LOVAZA) 1 G capsule Take 1 capsule (1 g total) by mouth 2 (two) times daily. 60 capsule 5  . omeprazole (PRILOSEC) 40 MG capsule Take 40 mg by mouth daily.    [name] oxycodone (ROXICODONE) 30 MG immediate release tablet Take 30 mg by mouth 4 (four) times daily as needed for pain.   0  . rosuvastatin (CRESTOR) 20 MG tablet Take 20 mg by mouth daily.    . sertraline (ZOLOFT) 100 MG tablet Take 100 mg by mouth at bedtime.     [name] spironolactone (ALDACTONE) 25 MG tablet Take 1 tablet by mouth every other day. (Patient taking differently: Take 25 mg by mouth every other day. ) 15 tablet 3  . torsemide (DEMADEX) 20 MG tablet Take 2 tablets by mouth daily . (Patient taking differently: Take 40 mg by mouth daily. ) 60 tablet 4   No current facility-administered medications for this visit.     Allergies:   Hydrocodone-acetaminophen    Social History:  The patient  reports that he has been smoking cigarettes. He has a 20.00 pack-year smoking history. He has never used smokeless tobacco. He reports current alcohol use of about 4.0 standard drinks of alcohol per week. He reports that he does not use drugs.   Family History:  The patient's family history includes  Bone cancer in his mother; Breast cancer in his sister; Heart disease in his father and sister.    ROS: All other systems are reviewed and negative. Unless otherwise mentioned in H&P    PHYSICAL EXAM: VS:  There were no vitals taken for this visit. , BMI There is no height or weight on file to calculate BMI. GEN: Well nourished, well developed, in no acute distress HEENT: normal Neck: no JVD, carotid bruits, or masses Cardiac: ***RRR; no murmurs, rubs, or gallops,no edema  Respiratory:  Clear to auscultation bilaterally, normal work of breathing GI: soft, nontender, nondistended, + BS MS: no deformity or atrophy Skin: warm and dry, no rash Neuro:  Strength and sensation are intact Psych: euthymic mood, full affect   EKG:  EKG {ACTION; IS/IS [id number] ordered today. The ekg ordered today demonstrates ***   Recent Labs: 02/11/2019: BUN 22; Creatinine, Ser 1.28; NT-Pro BNP 485; Potassium 5.0; Sodium 141 02/23/2019: Hemoglobin 16.8; Platelets 133.0 03/02/2019: ALT 66    Lipid Panel    Component Value Date/Time   CHOL 329 (H) 03/29/2013 1238   TRIG 410 (H) 05/26/2013 1443   HDL 21 (L) 03/29/2013 1238   CHOLHDL 15.7 03/29/2013 1238   VLDL NOT CALC 03/29/2013 1238   LDLCALC NOT CALC 03/29/2013 1238   LDLDIRECT 89 12/09/2012 1225      Wt Readings from Last 3 Encounters:  03/31/19 206 lb (93.4 kg)  02/23/19 212 lb (96.2 kg)  02/11/19 214 lb 3.2 oz (97.2 kg)      Other studies Reviewed: Additional studies/ records that were reviewed today include: ***. Review of the above records demonstrates: ***   ASSESSMENT AND PLAN:  1.  ***   Current medicines are reviewed at length with the patient today.    Labs/ tests ordered today include: *** Phill Myron. [place] Pugh, ANP, AACC   04/11/2019 9:00 AM    [place] [address] Office [phone] Fax [phone]

## 2019-04-12 ENCOUNTER — Encounter (HOSPITAL_COMMUNITY): Payer: Self-pay | Admitting: Radiology

## 2019-04-12 NOTE — Progress Notes (Unsigned)
Medtronic Spinal Stimulator is NOT Safe for patient to have MRI Abdomen.

## 2019-04-13 ENCOUNTER — Ambulatory Visit (HOSPITAL_COMMUNITY): Admission: RE | Admit: 2019-04-13 | Payer: Medicare Other | Source: Ambulatory Visit

## 2019-04-13 ENCOUNTER — Ambulatory Visit: Payer: Medicare Other | Admitting: Adult Health

## 2019-04-15 IMAGING — CR DG CHEST 2V
2 series · 2 of 2 positions shown · non-contrast
Comparison: Radiographs February 03, 2017.

CLINICAL DATA: Shortness of breath.

EXAM:
CHEST - 2 VIEW

[chest pa]
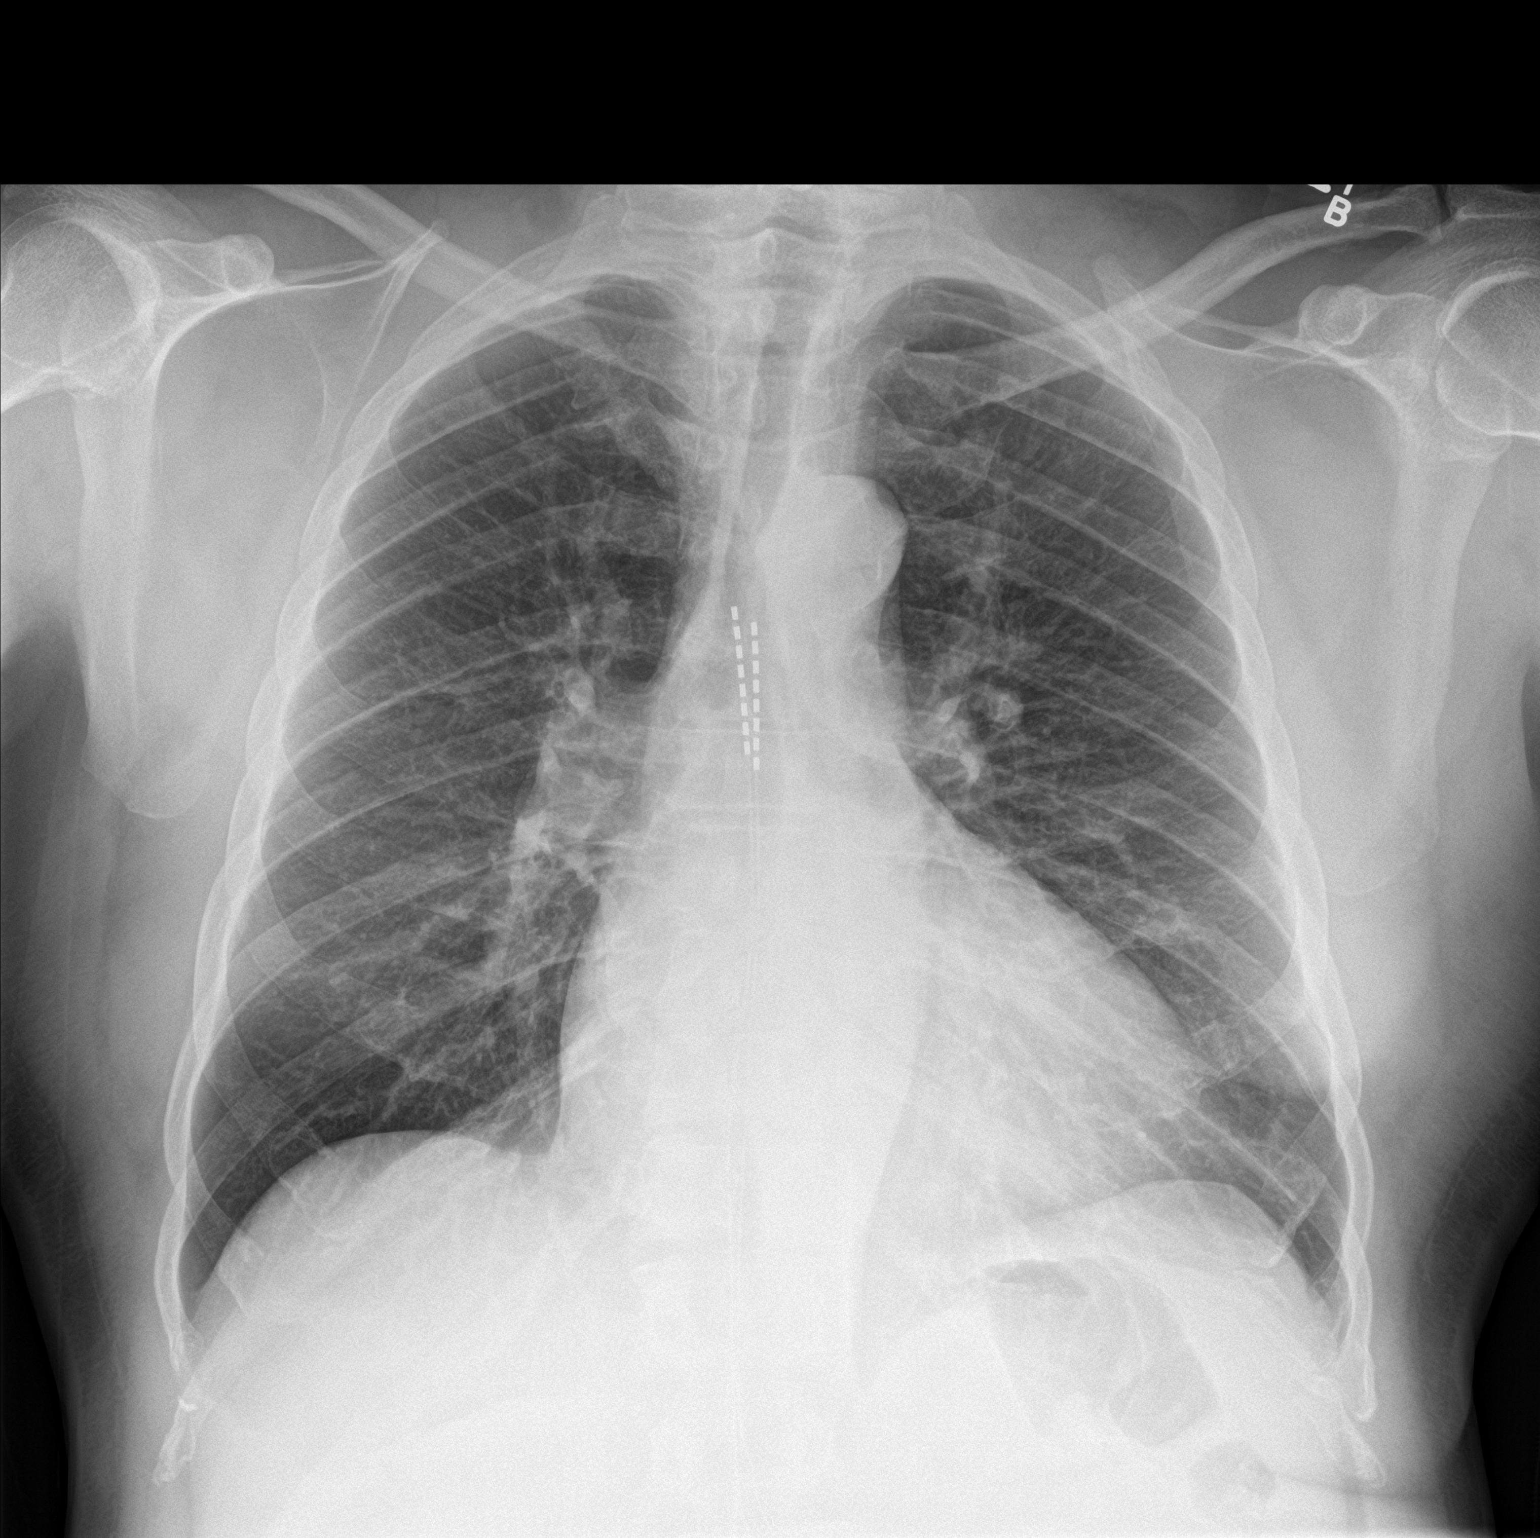

[chest lat]
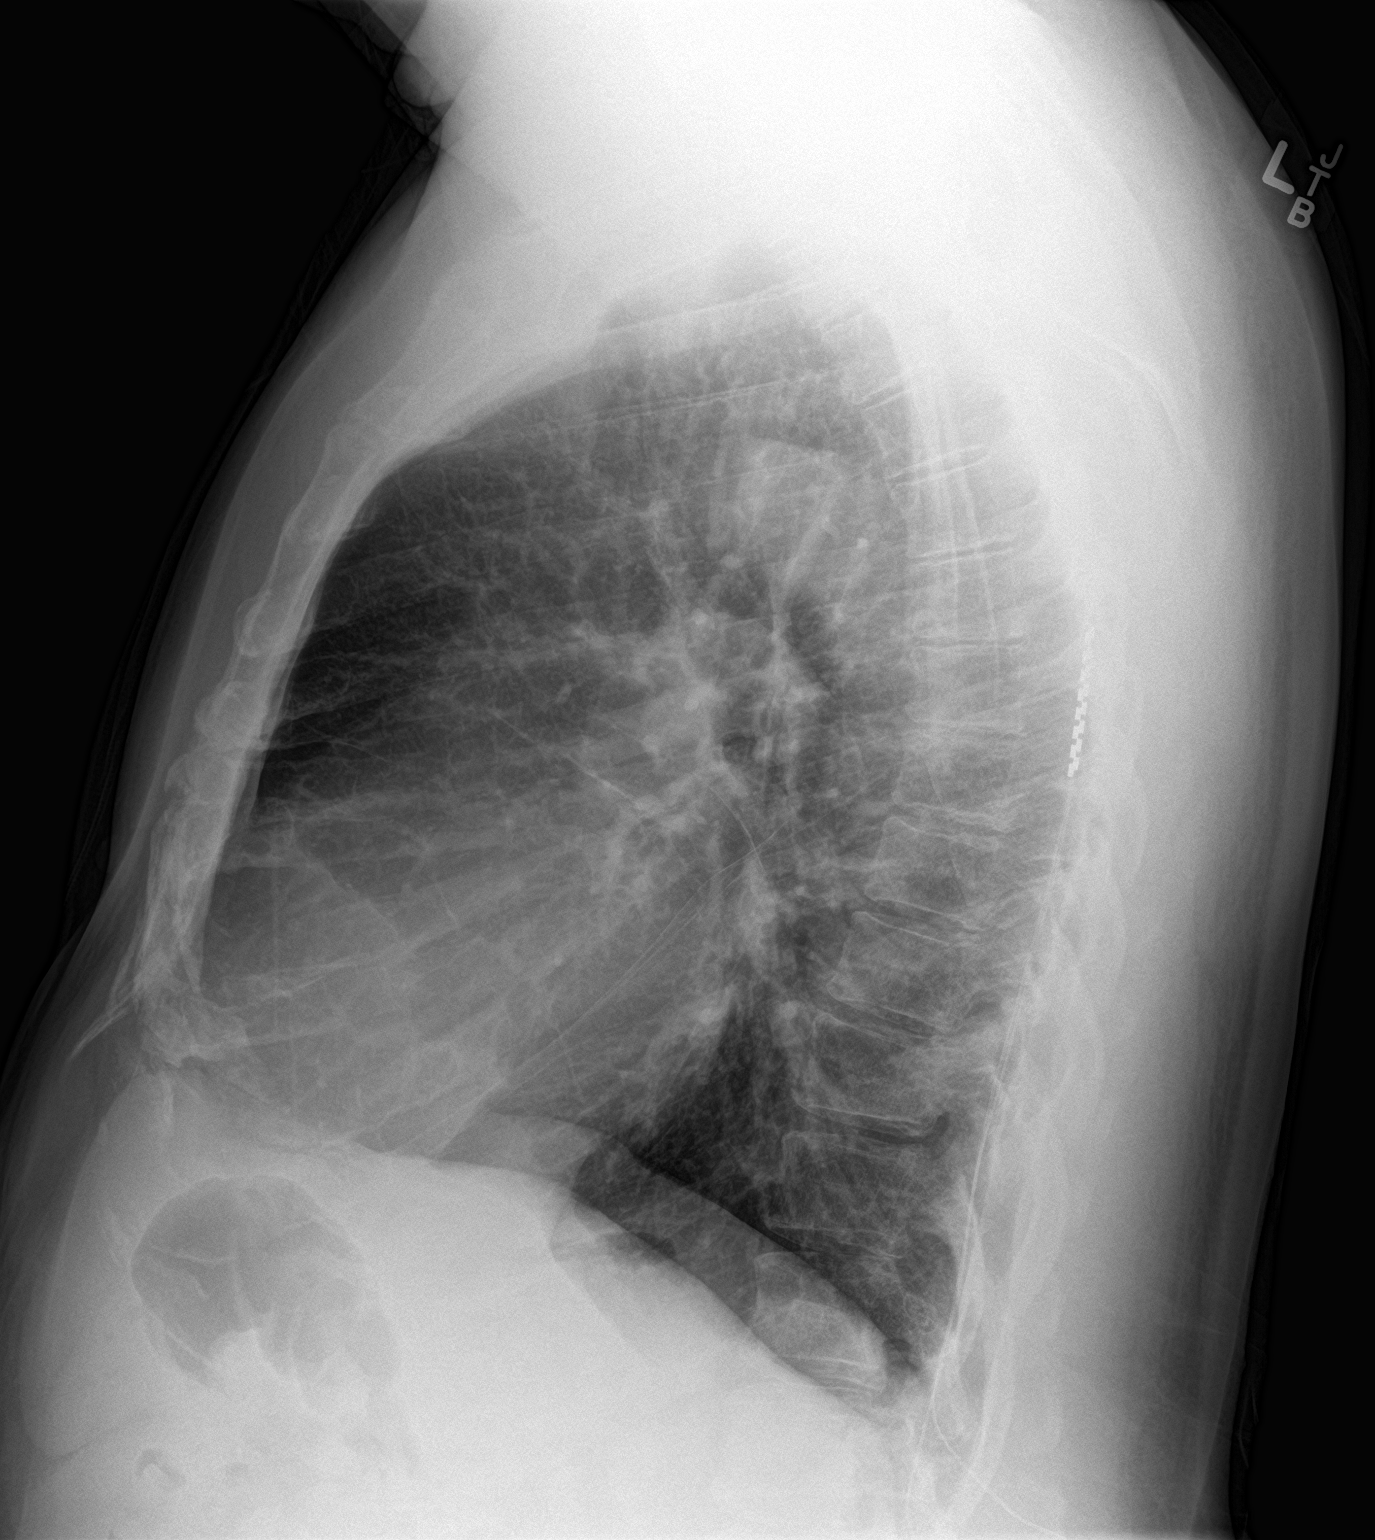

[2 of 2 positions shown; findings below may reference images not displayed]

FINDINGS: Stable cardiomegaly. No pneumothorax or pleural effusion is noted.
Both lungs are clear. The visualized skeletal structures are
unremarkable. Stimulator wires are unchanged in position in
midthoracic spinal Mirka. L
IMPRESSION: No active cardiopulmonary disease.

## 2019-05-01 NOTE — Progress Notes (Signed)
Virtual Visit via Video Note   This visit type was conducted due to national recommendations for restrictions regarding the COVID-19 Pandemic (e.g. social distancing) in an effort to limit this patient's exposure and mitigate transmission in our community.  Due to his co-morbid illnesses, this patient is at least at moderate risk for complications without adequate follow up.  This format is felt to be most appropriate for this patient at this time.  All issues noted in this document were discussed and addressed.  A limited physical exam was performed with this format.  Please refer to the patient's chart for his consent to telehealth for [facility].   Date:  05/01/2019   ID:  Jerome Adams, DOB Jun 06, 1961, MRN [id number]  Patient Location: Home Provider Location: Home  PCP:  Antonietta Jewel, MD  Cardiologist: Dr. Percival Spanish Electrophysiologist:  None   Evaluation Performed:  Follow-Up Visit  Chief Complaint:  Follow Up  History of Present Illness:    Jerome Adams is a 58 y.o. 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  Other history includes chronic pancreatitis, chronic kidney disease stage II, COPD, GERD and depression.  Was last seen by Dr. Percival Spanish on 02/11/2019.  He was complaining of lightheadedness and abdominal bloating.  He was not found to be orthostatic.  He was not taking his medications correctly and therefore he was reeducated on dosing.  Blood pressure was high normal for his current ejection fraction.  A repeat echocardiogram was ordered along with labs for evaluation of kidney function, and also included a BNP.  Repeat echocardiogram on 02/16/2019 revealed an EF of 40% to 45% with normal cavity size, he did have evidence of diastolic dysfunction.  The LA size was severely dilated, the RA  size was severely dilated.  He was noted to have mildly elevated BNP and was advised to take extra dose of torsemide daily for 4 days.  Jerome Adams underwent an upper endoscopic ultrasound on 03/31/2019 with biopsies.  He is followed by GI.  There was no evidence of cancer.  He does have a precancerous polyp growing in his major papilla in the duodenum which was causing incomplete biliary obstruction and elevated liver tests.  Further treatment with an ampullectomy is being considered.  Follow-up on May 12, 2019.  Jerome Adams is without any cardiac complaints today.  He has gained about 3 pounds over the weekend and admits to eating some [place] food.  He has not yet taken his torsemide.  Otherwise his breathing status has been good, he denies any lower extremity edema.  He is compliant with his medications.  He is planning on quitting smoking.  A person that was living with him at one time was a smoker and has left.  He feels now is a good time to quit smoking as it will be easier without someone else in his home who smokes.  He is also being followed by [facility] and has had recent labs completed.  The patient does not have symptoms concerning for COVID-19 infection (fever, chills, cough, or new shortness of breath).    Past Medical History:  Diagnosis Date   Alcohol abuse    Arthritis    "joints" (03/30/2013)   Chronic lower back pain    Chronic pancreatitis ([place])    CKD (chronic kidney disease), stage II  COPD (chronic obstructive pulmonary disease) ([place]) 07/31/2014   Depression    Drug abuse ([place])    a. Remote drug abuse (cocaine). UDS 07/2014 neg for cocaine.   Essential hypertension    GERD (gastroesophageal reflux disease)    takes  otc   Hepatitis C 2010   a. never treated - undectable virus 12/2013.   Hyperlipidemia    Hypertriglyceridemia    NICM (nonischemic cardiomyopathy) ([place])    a. 12/2007 Echo: EF 45-50%;  b. 07/2014 Echo: EF 20-25%, diff HK, mod dil LA,  sev dil RA, mild TR, PASP 31 mmHg.   PUD (peptic ulcer disease)    Sleep apnea    was tested here at [facility].Marland KitchenMarland KitchenMarland KitchenNever heard anymore about it (03/30/2013)   Spinal cord stimulator status    has had for 1 yr--inserted by [facility]   Tobacco abuse    Transaminitis    Past Surgical History:  Procedure Laterality Date   ANTERIOR CERVICAL DECOMP/DISCECTOMY FUSION  04/05/2012   Procedure: ANTERIOR CERVICAL DECOMPRESSION/DISCECTOMY FUSION 1 LEVEL;  Surgeon: Elaina Hoops, MD;  Location: [place] [facility]UR[facility] ORS;  Service: Neurosurgery;  Laterality: N/A;  Cervical five-six anterior cervical decompression and fusion   BACK SURGERY     BIOPSY  03/31/2019   Procedure: BIOPSY;  Surgeon: Milus Banister, MD;  Location: [facility] ENDOSCOPY;  Service: Endoscopy;;   COLONOSCOPY WITH PROPOFOL N/A 08/28/2016   Procedure: COLONOSCOPY WITH PROPOFOL;  Surgeon: Milus Banister, MD;  Location: [facility] ENDOSCOPY;  Service: Endoscopy;  Laterality: N/A;   ESOPHAGOGASTRODUODENOSCOPY (EGD) WITH PROPOFOL N/A 08/28/2016   Procedure: ESOPHAGOGASTRODUODENOSCOPY (EGD) WITH PROPOFOL;  Surgeon: Milus Banister, MD;  Location: [facility] END[facility];  Service: Endoscopy;  Laterality: N/A;   ESOPHAGOGASTRODUODENOSCOPY (EGD) WITH PROPOFOL N/A 03/31/2019   Procedure: ESOPHAGOGASTRODUODENOSCOPY (EGD) WITH PROPOFOL;  Surgeon: Milus Banister, MD;  Location: [facility] ENDOSCOPY;  Service: Endoscopy;  Laterality: N/A;   EUS N/A 03/31/2019   Procedure: UPPER ENDOSCOPIC ULTRASOUND (EUS) RADIAL;  Surgeon: Milus Banister, MD;  Location: [facility] ENDOSCOPY;  Service: Endoscopy;  Laterality: N/A;   FOOT SURGERY Right 2012?   "took piece off that was hurting me" (03/30/2013)   HARDWARE REMOVAL     "took screw out of my back; it had damaged my nerve" (03/30/2013)   INCISION AND DRAINAGE ABSCESS Left    "lower jaw, tooth abscess" (03/30/2013)   LEFT HEART CATHETERIZATION WITH CORONARY ANGIOGRAM N/A 08/03/2014   Procedure: LEFT HEART CATHETERIZATION WITH CORONARY ANGIOGRAM;  Surgeon:  Blane Ohara, MD;  Location[facility] C[facility] LAB;  Service: Cardiovascular;  Laterality: N/A;   POSTERIOR LUMBAR FUSION     "L4-5" (03/31/2103)   SPINAL CORD STIMULATOR IMPLANT  11/14/2012   WRIST SURGERY Right 1980's   repair of tendons and nerves     No outpatient medications have been marked as taking for the 05/02/19 encounter (Appointment) with Lendon Colonel, NP.     Allergies:   Hydrocodone-acetaminophen   Social History   Tobacco Use   Smoking status: Current Some Day Smoker    Packs/day: 0.50    Years: 40.00    Pack years: 20.00    Types: Cigarettes   Smokeless tobacco: Never Used  Substance Use Topics   Alcohol use: Yes    Alcohol/week: 4.0 standard drinks    Types: 4 Cans of beer per week    Comment: Has been drinking since age 43.  Has averaged 4 22 oz cans of beer daily until late 2015.   Drug use: No  Types: Cocaine    Comment: 03/30/2013 "[place]"     Family Hx: The patient's family history includes Bone cancer in his mother; Breast cancer in his sister; Heart disease in his father and sister. There is no history of Esophageal cancer or Stomach cancer.  ROS:   Please see the history of present illness.    All other systems reviewed and are negative.   Prior CV studies:   The following studies were reviewed today: Echocardiogram 02/16/2019 1. The left ventricle has mild-moderately reduced systolic function, with an ejection fraction of 40-45%. The cavity size was normal. Left ventricular diastolic Doppler parameters are consistent with impaired relaxation.  2. The right ventricle has normal systolic function. The cavity was mildly enlarged. There is no increase in right ventricular wall thickness.  3. Left atrial size was severely dilated.  4. Right atrial size was severely dilated.  5. Mild thickening of the mitral valve leaflet.  6. The aortic valve is tricuspid. Mild thickening of the aortic valve. Mild calcification of the aortic valve.   7. The aorta is normal in size and structure.  Labs/Other Tests and Data Reviewed:    EKG:  No ECG reviewed.  Recent Labs: 02/11/2019: BUN 22; Creatinine, Ser 1.28; NT-Pro BNP 485; Potassium 5.0; Sodium 141 02/23/2019: Hemoglobin 16.8; Platelets 133.0 03/02/2019: ALT 66   Recent Lipid Panel Lab Results  Component Value Date/Time   CHOL 329 (H) 03/29/2013 12:38 PM   TRIG 410 (H) 05/26/2013 02:43 PM   HDL 21 (L) 03/29/2013 12:38 PM   CHOLHDL 15.7 03/29/2013 12:38 PM   LDLCALC NOT CALC 03/29/2013 12:38 PM   LDLDIRECT 89 12/09/2012 12:25 PM    Wt Readings from Last 3 Encounters:  03/31/19 206 lb (93.4 kg)  02/23/19 212 lb (96.2 kg)  02/11/19 214 lb 3.2 oz (97.2 kg)     Objective:    Vital Signs:  There were no vitals taken for this visit.   VITAL SIGNS:  reviewed GEN:  no acute distress RESPIRATORY:  normal respiratory effort, symmetric expansion SKIN:  no rash, lesions or ulcers. NEURO:  alert and oriented x 3, no obvious focal deficit PSYCH:  normal affect  ASSESSMENT & PLAN:    1.  Nonischemic cardiomyopathy: Thought to be related to alcohol abuse.  He has stopped drinking for several years now.  EF has improved from initial reading of 20% to 25% up to 40 to 45% on most recent echo.  He continues on Entresto 49/51 mg daily metoprolol succinate 100 mg daily, and offers no complaints of rapid heart rhythm or dyspnea.  2.  Chronic mixed CHF: The patient has put on about 3 pounds over the weekend, likely related to dietary indiscretion.  I have reinforced a low-sodium diet.  He is advised to weigh himself every day and also take his blood pressure every day.  The patient may take an additional dose of torsemide for any weight gain between 3 to 5 pounds.  He is educated on the importance of daily weights so that we can follow a trend, and manage this without hospitalization.  He verbalizes understanding  3.  Hypertension: He reports that his blood pressures been in the [id number]  systolic range on most recent office appointments.  He does not have a blood pressure cuff to check his own blood pressure at home.  I will arrange for him to have one sent via our social worker.  In the interim the patient will continue spironolactone,  Entresto, metoprolol, as directed.  I will request labs which were recently drawn by [facility].  4.  Hypercholesterolemia: The patient continues on rosuvastatin 20 mg daily.  Follow-up lipids and LFTs will need to be repeated after he has his procedure completed as described above.  5.  Periampullary precancerous polyp: Followed by GI with plan to consider ampullectomy, and is due to have follow-up appointment in October to discuss.  He does have elevated LFTs as a result of this.  He is stable from a cardiac standpoint to proceed with this without any changes in medication regimen.  Okay to hold aspirin if necessary.  COVID-19 Education: The signs and symptoms of COVID-19 were discussed with the patient and how to seek care for testing (follow up with PCP or arrange E-visit). The importance of social distancing was discussed today.  Time:   Today, I have spent 20 minutes with the patient with telehealth technology discussing the above problems.     Medication Adjustments/Labs and Tests Ordered: Current medicines are reviewed at length with the patient today.  Concerns regarding medicines are outlined above.   Tests Ordered: No orders of the defined types were placed in this encounter.   Medication Changes: No orders of the defined types were placed in this encounter.   Disposition:  Follow up 6 months with Dr. Percival Spanish unless symptomatic  Signed, Phill Myron. [place] Pugh, ANP, AACC  05/01/2019 5:32 PM    [facility]

## 2019-05-02 ENCOUNTER — Encounter: Payer: Self-pay | Admitting: Adult Health

## 2019-05-02 ENCOUNTER — Telehealth (INDEPENDENT_AMBULATORY_CARE_PROVIDER_SITE_OTHER): Payer: Medicare Other | Admitting: Adult Health

## 2019-05-02 VITALS — Ht 70.0 in | Wt 216.4 lb

## 2019-05-02 DIAGNOSIS — I5042 Chronic combined systolic (congestive) and diastolic (congestive) heart failure: Secondary | ICD-10-CM

## 2019-05-02 DIAGNOSIS — N182 Chronic kidney disease, stage 2 (mild): Secondary | ICD-10-CM

## 2019-05-02 DIAGNOSIS — I43 Cardiomyopathy in diseases classified elsewhere: Secondary | ICD-10-CM

## 2019-05-02 DIAGNOSIS — I10 Essential (primary) hypertension: Secondary | ICD-10-CM

## 2019-05-02 DIAGNOSIS — E7800 Pure hypercholesterolemia, unspecified: Secondary | ICD-10-CM

## 2019-05-02 NOTE — Patient Instructions (Signed)
Medication Instructions:  Continue current medications  If you need a refill on your cardiac medications before your next appointment, please call your pharmacy.  Labwork: None Ordered   Testing/Procedures: None Ordered   Follow-Up: You will need a follow up appointment in 6 months.  Please call our office 2 months in advance to schedule this appointment.  You may see Dr Hochrein or one of the following Advanced Practice Providers on your designated Care Team:   Rhonda Barrett, PA-C . Kathryn Lawrence, DNP, ANP    At [facility], you and your health needs are our priority.  As part of our continuing mission to provide you with exceptional heart care, we have created designated Provider Care Teams.  These Care Teams include your primary Cardiologist (physician) and Advanced Practice Providers (APPs -  Physician Assistants and Nurse Practitioners) who all work together to provide you with the care you need, when you need it.  Thank you for choosing [facility] at [facility]!!     

## 2019-05-07 ENCOUNTER — Other Ambulatory Visit: Payer: Self-pay | Admitting: Cardiology

## 2019-05-12 ENCOUNTER — Other Ambulatory Visit: Payer: Self-pay

## 2019-05-12 ENCOUNTER — Ambulatory Visit (INDEPENDENT_AMBULATORY_CARE_PROVIDER_SITE_OTHER): Payer: Medicare Other | Admitting: Gastroenterology

## 2019-05-12 ENCOUNTER — Encounter: Payer: Self-pay | Admitting: Gastroenterology

## 2019-05-12 VITALS — BP 138/74 | HR 69 | Temp 97.9°F | Ht 70.0 in | Wt 225.0 lb

## 2019-05-12 DIAGNOSIS — K838 Other specified diseases of biliary tract: Secondary | ICD-10-CM | POA: Diagnosis not present

## 2019-05-12 DIAGNOSIS — R109 Unspecified abdominal pain: Secondary | ICD-10-CM | POA: Diagnosis not present

## 2019-05-12 DIAGNOSIS — R7989 Other specified abnormal findings of blood chemistry: Secondary | ICD-10-CM

## 2019-05-12 DIAGNOSIS — D135 Benign neoplasm of extrahepatic bile ducts: Secondary | ICD-10-CM | POA: Diagnosis not present

## 2019-05-12 DIAGNOSIS — R945 Abnormal results of liver function studies: Secondary | ICD-10-CM

## 2019-05-12 NOTE — Progress Notes (Signed)
GASTROENTEROLOGY OUTPATIENT CLINIC VISIT   Primary Care Provider Antonietta Jewel, MD [address], [place] [place] [address] [phone]  Referring Provider Dr. Ardis Hughs  Patient Profile: Jerome Adams is a 58 y.o. male with a pmh significant for nonischemic cardiomyopathy, COPD, sleep apnea, PUD, hypertriglyceridemia, hyperlipidemia, positive hepatitis C antibody, arthritis, ampullary adenoma with dilated biliary tree.  The patient presents to the [facility] for an evaluation and management of problem(s) noted below:  Problem List 1. Ampullary adenoma   2. Abnormal LFTs   3. Dilated bile duct   4. Abdominal pain of unknown etiology     History of Present Illness Please see initial consultation note and progress notes by PA Lemmon and Dr. Ardis Hughs for full details of HPI.  Interval History The patient recently underwent an endoscopic ultrasound for further evaluation of a dilated biliary tree with abnormal liver tests.  Results are as below but found a lesion at the ampulla that returned on biopsy as an adenoma.  EUS imaging did not suggest evidence of the muscularis propria being involved.  The patient was referred for discussion of possible endoscopic ampullectomy.  The patient's endoscopic ultrasound did not report findings of any pancreatic duct dilation and none was noted on ultrasound imaging.  The patient describes similar issues in regards to his abdominal discomfort he has had work-up performed for.  He describes a prior history of pancreatitis years ago.  The patient denies any issues with jaundice, scleral icterus, pruritus, darkened/amber urine, clay-colored stools, LE edema, hematemesis, coffee-ground emesis, abdominal distention, confusion, new generalized pruritus.    GI Review of Systems Positive as above Negative for dysphagia, odynophagia, early satiety, change in bowel habits, melena, hematochezia  Review of Systems General: Denies  fevers/chills/weight loss Cardiovascular: Denies chest pain/palpitations Pulmonary: Denies shortness of breath Gastroenterological: See HPI Genitourinary: Denies darkened urine Hematological: Denies easy bruising/bleeding Dermatological: Denies jaundice Psychological: Mood is stable   Medications Current Outpatient Medications  Medication Sig Dispense Refill  . aspirin EC 81 MG EC tablet Take 1 tablet (81 mg total) by mouth daily. 30 tablet 0  . ENTRESTO 49-51 MG Take 1 tablet by mouth twice daily. (Patient taking differently: Take 1 tablet by mouth 2 (two) times daily. ) 60 tablet 5  . gemfibrozil (LOPID) 600 MG tablet Take 1 tablet (600 mg total) by mouth 2 (two) times daily before a meal. 60 tablet 5  . metoprolol succinate (TOPROL-XL) 100 MG 24 hr tablet Take 1 tablet by mouth daily.**Take with or immediately following a meal.** 30 tablet 5  . Multiple Vitamins-Minerals (MULTIVITAMIN PO) Take 1 tablet by mouth daily.    [name] omega-3 acid ethyl esters (LOVAZA) 1 G capsule Take 1 capsule (1 g total) by mouth 2 (two) times daily. 60 capsule 5  . omeprazole (PRILOSEC) 40 MG capsule Take 40 mg by mouth daily.    [name] oxycodone (ROXICODONE) 30 MG immediate release tablet Take 30 mg by mouth 4 (four) times daily as needed for pain.   0  . rosuvastatin (CRESTOR) 20 MG tablet Take 20 mg by mouth daily.    . sertraline (ZOLOFT) 100 MG tablet Take 100 mg by mouth at bedtime.     [name] spironolactone (ALDACTONE) 25 MG tablet Take 1 tablet (25 mg total) by mouth every other day. 15 tablet 3  . torsemide (DEMADEX) 20 MG tablet Take 2 tablets by mouth daily . (Patient taking differently: Take 40 mg by mouth daily. ) 60 tablet 4  No current facility-administered medications for this visit.     Allergies Allergies  Allergen Reactions  . Hydrocodone-Acetaminophen Itching    Histories Past Medical History:  Diagnosis Date  . Alcohol abuse   . Arthritis    "joints" (03/30/2013)  . Chronic lower back  pain   . Chronic pancreatitis ([place])   . CKD (chronic kidney disease), stage II   . COPD (chronic obstructive pulmonary disease) ([place]) 07/31/2014  . Depression   . Drug abuse ([place])    a. Remote drug abuse (cocaine). UDS 07/2014 neg for cocaine.  . Essential hypertension   . GERD (gastroesophageal reflux disease)    takes  otc  . Hepatitis C 2010   a. never treated - undectable virus 12/2013.  [name] Hyperlipidemia   . Hypertriglyceridemia   . NICM (nonischemic cardiomyopathy) ([place])    a. 12/2007 Echo: EF 45-50%;  b. 07/2014 Echo: EF 20-25%, diff HK, mod dil LA, sev dil RA, mild TR, PASP 31 mmHg.  . PUD (peptic ulcer disease)   . Sleep apnea    was tested here at [facility].Marland KitchenMarland KitchenMarland KitchenNever heard anymore about it (03/30/2013)  . Spinal cord stimulator status    has had for 1 yr--inserted by [facility]  . Tobacco abuse   . Transaminitis    Past Surgical History:  Procedure Laterality Date  . ANTERIOR CERVICAL DECOMP/DISCECTOMY FUSION  04/05/2012   Procedure: ANTERIOR CERVICAL DECOMPRESSION/DISCECTOMY FUSION 1 LEVEL;  Surgeon: Elaina Hoops, MD;  Location: [facility];  Service: Neurosurgery;  Laterality: N/A;  Cervical five-six anterior cervical decompression and fusion  . BACK SURGERY    . BIOPSY  03/31/2019   Procedure: BIOPSY;  Surgeon: Milus Banister, MD;  Location: [facility];  Service: Endoscopy;;  . COLONOSCOPY WITH PROPOFOL N/A 08/28/2016   Procedure: COLONOSCOPY WITH PROPOFOL;  Surgeon: Milus Banister, MD;  Location: [facility] ENDOSCOPY;  Service: Endoscopy;  Laterality: N/A;  . ESOPHAGOGASTRODUODENOSCOPY (EGD) WITH PROPOFOL N/A 08/28/2016   Procedure: ESOPHAGOGASTRODUODENOSCOPY (EGD) WITH PROPOFOL;  Surgeon: Milus Banister, MD;  Location: [facility] ENDOSCOPY;  Service: Endoscopy;  Laterality: N/A;  . ESOPHAGOGASTRODUODENOSCOPY (EGD) WITH PROPOFOL N/A 03/31/2019   Procedure: ESOPHAGOGASTRODUODENOSCOPY (EGD) WITH PROPOFOL;  Surgeon: Milus Banister, MD;  Location: [facility] ENDOSCOPY;  Service: Endoscopy;  Laterality: N/A;   . EUS N/A 03/31/2019   Procedure: UPPER ENDOSCOPIC ULTRASOUND (EUS) RADIAL;  Surgeon: Milus Banister, MD;  Location: [facility] ENDOSCOPY;  Service: Endoscopy;  Laterality: N/A;  . FOOT SURGERY Right 2012?   "took piece off that was hurting me" (03/30/2013)  . HARDWARE REMOVAL     "took screw out of my back; it had damaged my nerve" (03/30/2013)  . INCISION AND DRAINAGE ABSCESS Left    "lower jaw, tooth abscess" (03/30/2013)  . LEFT HEART CATHETERIZATION WITH CORONARY ANGIOGRAM N/A 08/03/2014   Procedure: LEFT HEART CATHETERIZATION WITH CORONARY ANGIOGRAM;  Surgeon: Blane Ohara, MD;  Location: [facility] CATH LAB;  Service: Cardiovascular;  Laterality: N/A;  . POSTERIOR LUMBAR FUSION     "L4-5" (03/31/2103)  . SPINAL CORD STIMULATOR IMPLANT  11/14/2012  . WRIST SURGERY Right 1980's   repair of tendons and nerves   Social History   Socioeconomic History  . Marital status: Divorced    Spouse name: Not on file  . Number of children: 1  . Years of education: Not on file  . Highest education level: Not on file  Occupational History  . Occupation: UNEMPLOYEED    Employer: UNEMPLOYED    Comment: on disability.  Social Needs  . Financial resource strain: Not on file  . Food insecurity    Worry: Not on file    Inability: Not on file  . Transportation needs    Medical: Not on file    Non-medical: Not on file  Tobacco Use  . Smoking status: Current Some Day Smoker    Packs/day: 0.50    Years: 40.00    Pack years: 20.00    Types: Cigarettes  . Smokeless tobacco: Never Used  Substance and Sexual Activity  . Alcohol use: Not Currently    Alcohol/week: 4.0 standard drinks    Types: 4 Cans of beer per week    Comment: Has been drinking since age 6.  Has averaged 4 22 oz cans of beer daily until late 2015.  . Drug use: No    Types: Cocaine    Comment: 03/30/2013 "[place]"  . Sexual activity: Not on file  Lifestyle  . Physical activity    Days per week: Not on file    Minutes per session:  Not on file  . Stress: Not on file  Relationships  . Social Herbalist on phone: Not on file    Gets together: Not on file    Attends religious service: Not on file    Active member of club or organization: Not on file    Attends meetings of clubs or organizations: Not on file    Relationship status: Not on file  . Intimate partner violence    Fear of current or ex partner: Not on file    Emotionally abused: Not on file    Physically abused: Not on file    Forced sexual activity: Not on file  Other Topics Concern  . Not on file  Social History Narrative   Lives in [place] by himself.  Does not routinely exercise.   Family History  Problem Relation Age of Onset  . Heart disease Father        H/o MI followed by SCD @ 61.  [name] Heart disease Sister        Died suddenly @ 88 - h/o CHF s/p AICD.  [name] Breast cancer Sister   . Bone cancer Mother   . Esophageal cancer Neg Hx   . Stomach cancer Neg Hx   . Colon cancer Neg Hx   . Inflammatory bowel disease Neg Hx   . Liver disease Neg Hx   . Pancreatic cancer Neg Hx   . Rectal cancer Neg Hx    I have reviewed his medical, social, and family history in detail and updated the electronic medical record as necessary.    PHYSICAL EXAMINATION  BP 138/74   Pulse 69   Temp 97.9 F (36.6 C)   Ht '5\' 10"'  (1.778 m)   Wt 225 lb (102.1 kg)   BMI 32.28 kg/m  Wt Readings from Last 3 Encounters:  05/12/19 225 lb (102.1 kg)  05/02/19 216 lb 6.4 oz (98.2 kg)  03/31/19 206 lb (93.4 kg)  GEN: NAD, appears stated age, doesn't appear chronically ill PSYCH: Cooperative, without pressured speech EYE: Conjunctivae pink, sclerae anicteric ENT: MMM, without oral ulcers NECK: Supple, enlarged neck girth CV: RR without R/Gs  RESP: Mild wheezing present GI: NABS, soft, obese, rounded, protuberant, tenderness to palpation in midepigastrium and right upper quadrant, without rebound or guarding, no HSM appreciated MSK/EXT: No lower extremity edema  SKIN: No jaundice, no spider angiomata NEURO:  Alert & Oriented x 3,  no focal deficits, no evidence of asterixis   REVIEW OF DATA  I reviewed the following data at the time of this encounter:  GI Procedures and Studies  September 2020 EUS - I am suspicious of an ampullary neoplasm causing incomplete biliary obstruction, CBD dilation. The major papilla was nodular, bulbous, somewhat neoplastic appearing but not firm. By EUS the ampulla has non-specifically generous mucosal and deep mucosal layers without discrete mass and clearly the muscularis propria is normal at the site. I sampled the major papilla with extensive biopsies. - Mild gastritis, biopsied to check for H. pylori. - Small amount of retained solid and liquid food without anatomic outlet obstruction. Diagnosis 1. Duodenum, Biopsy, abnormal ampulla - DUODENAL ADENOMA. - NEGATIVE FOR HIGH GRADE DYSPLASIA. 2. Stomach, biopsy - GASTRIC ANTRAL MUCOSA WITH MILD REACTIVE GASTROPATHY. [place] STAIN IS NEGATIVE FOR HELICOBACTER PYLOR  Laboratory Studies  Reviewed those in epic  Imaging Studies  Right upper quadrant ultrasound IMPRESSION: 1. Mildly dilated common bile duct without sonographic evidence of choledocholithiasis . If further evaluation is required, would recommend MRCP 2. Hepatic steatosis   ASSESSMENT  Mr. Edmondson is a 58 y.o. male with a pmh significant for nonischemic cardiomyopathy, COPD, sleep apnea, PUD, hypertriglyceridemia, hyperlipidemia, positive hepatitis C antibody, arthritis, ampullary adenoma with dilated biliary tree.  The patient is seen today for evaluation and management of:  1. Ampullary adenoma   2. Abnormal LFTs   3. Dilated bile duct   4. Abdominal pain of unknown etiology    The patient is hemodynamically stable.  The patient is referred for consideration of endoscopic resection via ampullectomy of an ampullary adenoma.  Based upon the description and endoscopic pictures I do feel  that it is reasonable to pursue an Advanced Polypectomy attempt of the polyp/lesion.  We discussed some of the techniques of endoscopic ampullectomy.  The risks of an ERCP were discussed at length, including but not limited to the risk of perforation, bleeding, abdominal pain, post-ERCP pancreatitis (while usually mild can be severe and even life threatening).  My approach is such that we attempt both pancreatic duct and biliary duct cannulation if possible with small sphincterotomies to hopefully I have increased chance of access post ampullectomy of stent placement.  If unsuccessful cannulation then proceed with ampullectomy with hope that we are able to find the pancreatic orifice and biliary orifice post procedure with rectal indomethacin no matter what given to decrease risk of post ERCP pancreatitis.  During attempts at endoscopic ampullectomy, the risks of bleeding and perforation/leak and pancreatitis are increased as opposed to normal ERCPs, and that was discussed with the patient as well.   In addition, I explained that en-bloc resection is ideal but not always possible.  Subsequent short-interval endoscopic evaluation for follow up and potential retreatment of the lesion/area may be necessary.  I did offer, a referral to surgery in order for patient to have opportunity to discuss surgical ampullectomy/intervention prior to finalizing decision for attempt at endoscopic removal.  The patient feels that he wants to proceed with endoscopic management most likely but does think a surgical referral would be helpful so that he may hear his options.  If, after endoscopic attempt at removal of the polyp, it is found that the patient has a complication or that an invasive lesion or malignant lesion is found, or that the polyp continues to recur, the patient is aware and understands that surgery may still be indicated/required.  I will place a referral to Dr. Barry Dienes to discuss  her technique of surgical ampullectomy.   We discussed the role of an endoscopic ultrasound to better understand the pancreatic duct orifice.  We will plan to proceed with this as well at the time of his procedure.  I think a pancreas protocol CT abdomen will help guide me as well to ensure I have a good understanding of the pancreas duct prior to his procedure date and we will attempt to have that completed as well.  All patient questions were answered, to the best of my ability, and the patient agrees to the aforementioned plan of action with follow-up as indicated.   PLAN  Laboratories as outlined below Pancreas protocol CT abdomen to better define the pancreatic duct Referral to Dr. Barry Dienes to discuss management via surgical ampullectomy Proceed with scheduling EUS/ERCP for endoscopic ampullectomy attempt a few weeks from now so that he has opportunity to talk with surgery first    Orders Placed This Encounter  Procedures  . Procedural/ Surgical Case Request: ENDOSCOPIC RETROGRADE CHOLANGIOPANCREATOGRAPHY (ERCP)  . CBC  . Comp Met (CMET)  . INR/PT  . Ambulatory referral to Gastroenterology  . Ambulatory referral to General Surgery    New Prescriptions   No medications on file   Modified Medications   No medications on file    Planned Follow Up No follow-ups on file.   Justice Britain, MD [facility] Advanced Endoscopy Office # [phone]

## 2019-05-12 NOTE — Patient Instructions (Addendum)
Your provider has requested that you go to the basement level for lab work 1 week before procedure (06/13/19). Press "B" on the elevator. The lab is located at the first door on the left as you exit the elevator.  If you are age 58 or older, your body mass index should be between 23-30. Your Body mass index is 32.28 kg/m. If this is out of the aforementioned range listed, please consider follow up with your Primary Care Provider.  If you are age 69 or younger, your body mass index should be between 19-25. Your Body mass index is 32.28 kg/m. If this is out of the aformentioned range listed, please consider follow up with your Primary Care Provider.    You have been scheduled for an endoscopy. Please follow written instructions given to you at your visit today. If you use inhalers (even only as needed), please bring them with you on the day of your procedure.  Thank you for choosing me and [facility].  Dr. Rush Landmark

## 2019-05-14 ENCOUNTER — Encounter: Payer: Self-pay | Admitting: Gastroenterology

## 2019-05-14 DIAGNOSIS — D135 Benign neoplasm of extrahepatic bile ducts: Secondary | ICD-10-CM | POA: Insufficient documentation

## 2019-05-24 ENCOUNTER — Telehealth: Payer: Self-pay

## 2019-05-24 NOTE — Telephone Encounter (Signed)
You have been scheduled for a CT scan of Pancreas at [facility] 1st floor Radiology   You are scheduled on 06/02/19 at 12:30pm. You should arrive 15 minutes prior to your appointment time for registration. Please follow the written instructions below on the day of your exam:  WARNING: IF YOU ARE ALLERGIC TO IODINE/X-RAY DYE, PLEASE NOTIFY RADIOLOGY IMMEDIATELY AT [phone]! YOU WILL BE GIVEN A 13 HOUR PREMEDICATION PREP.  1) Do not eat solid foods after 8:30am(4 hours prior to your test).You may have liquids only after 8:30am.   You may take any medications as prescribed with a small amount of water, if necessary. If you take any of the following medications: METFORMIN, GLUCOPHAGE, GLUCOVANCE, AVANDAMET, RIOMET, FORTAMET, [place] MET, JANUMET, GLUMETZA or METAGLIP, you MAY be asked to HOLD this medication 48 hours AFTER the exam.  The purpose of you drinking the oral contrast is to aid in the visualization of your intestinal tract. The contrast solution may cause some diarrhea. Depending on your individual set of symptoms, you may also receive an intravenous injection of x-ray contrast/dye. Plan on being a [place]  for 30 minutes or longer, depending on the type of exam you are having performed.  This test typically takes 30-45 minutes to complete.  If you have any questions regarding your exam or if you need to reschedule, you may call the CT department at [phone] between the hours of 8:00 am and 5:00 pm, Monday-Friday.  ________________________________________________________________________

## 2019-05-24 NOTE — Telephone Encounter (Signed)
-----   Message from Irving Copas., MD sent at 05/14/2019  6:40 AM EDT ----- Raeanne Barry reach out to the patient and let them know that after finalizing my note and rereviewing his chart I would like to move forward with a pancreas protocol CT abdomen.  I have ordered that.  But to move forward with getting that scheduled as that will help direct me in regards to his pancreas duct.Otherwise we will continue with our plan discussed at clinic.Thanks.GM

## 2019-05-24 NOTE — Telephone Encounter (Deleted)
You have been scheduled for a CT scan of Pancreas at [facility] 1st floor Radiology   You are scheduled on 06/02/19 at 12:30pm. You should arrive 15 minutes prior to your appointment time for registration. Please follow the written instructions below on the day of your exam:  WARNING: IF YOU ARE ALLERGIC TO IODINE/X-RAY DYE, PLEASE NOTIFY RADIOLOGY IMMEDIATELY AT [phone]! YOU WILL BE GIVEN A 13 HOUR PREMEDICATION PREP.  1) Do not eat solid foods after 8:30am(4 hours prior to your test).You may have liquids only after 8:30am.   You may take any medications as prescribed with a small amount of water, if necessary. If you take any of the following medications: METFORMIN, GLUCOPHAGE, GLUCOVANCE, AVANDAMET, RIOMET, FORTAMET, [place] MET, JANUMET, GLUMETZA or METAGLIP, you MAY be asked to HOLD this medication 48 hours AFTER the exam.  The purpose of you drinking the oral contrast is to aid in the visualization of your intestinal tract. The contrast solution may cause some diarrhea. Depending on your individual set of symptoms, you may also receive an intravenous injection of x-ray contrast/dye. Plan on being a [place]  for 30 minutes or longer, depending on the type of exam you are having performed.  This test typically takes 30-45 minutes to complete.  If you have any questions regarding your exam or if you need to reschedule, you may call the CT department at [phone] between the hours of 8:00 am and 5:00 pm, Monday-Friday.  ________________________________________________________________________

## 2019-05-24 NOTE — Telephone Encounter (Deleted)
You have been scheduled for a CT scan of Pancreas at [facility] 1st floor Radiology   You are scheduled on 06/02/19 at 12:30pm. You should arrive 15 minutes prior to your appointment time for registration. Please follow the written instructions below on the day of your exam:  WARNING: IF YOU ARE ALLERGIC TO IODINE/X-RAY DYE, PLEASE NOTIFY RADIOLOGY IMMEDIATELY AT [phone]! YOU WILL BE GIVEN A 13 HOUR PREMEDICATION PREP.  1) Do not eat solid foods after 8:30am(4 hours prior to your test).You may have liquids only after 8:30am.   You may take any medications as prescribed with a small amount of water, if necessary. If you take any of the following medications: METFORMIN, GLUCOPHAGE, GLUCOVANCE, AVANDAMET, RIOMET, FORTAMET, [place] MET, JANUMET, GLUMETZA or METAGLIP, you MAY be asked to HOLD this medication 48 hours AFTER the exam.  The purpose of you drinking the oral contrast is to aid in the visualization of your intestinal tract. The contrast solution may cause some diarrhea. Depending on your individual set of symptoms, you may also receive an intravenous injection of x-ray contrast/dye. Plan on being a [place]  for 30 minutes or longer, depending on the type of exam you are having performed.  This test typically takes 30-45 minutes to complete.  If you have any questions regarding your exam or if you need to reschedule, you may call the CT department at [phone] between the hours of 8:00 am and 5:00 pm, Monday-Friday.  ________________________________________________________________________

## 2019-06-02 ENCOUNTER — Ambulatory Visit (HOSPITAL_COMMUNITY)
Admission: RE | Admit: 2019-06-02 | Discharge: 2019-06-02 | Disposition: A | Discharge status: Home / Self Care | Payer: Medicare Other | Source: Ambulatory Visit | Attending: Gastroenterology | Admitting: Gastroenterology

## 2019-06-02 ENCOUNTER — Other Ambulatory Visit: Payer: Self-pay

## 2019-06-02 DIAGNOSIS — R945 Abnormal results of liver function studies: Secondary | ICD-10-CM | POA: Diagnosis not present

## 2019-06-02 MED ORDER — IOHEXOL 300 MG/ML  SOLN
100.0000 mL | Freq: Once | INTRAMUSCULAR | Status: AC | PRN
  Administered 2019-06-02: 100 mL via INTRAVENOUS

## 2019-06-02 MED ORDER — SODIUM CHLORIDE (PF) 0.9 % IJ SOLN
INTRAMUSCULAR | Status: AC
  Filled 2019-06-02: qty 50

## 2019-06-16 ENCOUNTER — Other Ambulatory Visit (HOSPITAL_COMMUNITY)
Admission: RE | Admit: 2019-06-16 | Discharge: 2019-06-16 | Disposition: A | Discharge status: Home / Self Care | Payer: Medicare Other | Source: Ambulatory Visit | Attending: Gastroenterology | Admitting: Gastroenterology

## 2019-06-16 DIAGNOSIS — Z01812 Encounter for preprocedural laboratory examination: Secondary | ICD-10-CM | POA: Insufficient documentation

## 2019-06-16 DIAGNOSIS — Z20828 Contact with and (suspected) exposure to other viral communicable diseases: Secondary | ICD-10-CM | POA: Insufficient documentation

## 2019-06-17 ENCOUNTER — Other Ambulatory Visit: Payer: Self-pay

## 2019-06-17 ENCOUNTER — Encounter (HOSPITAL_COMMUNITY): Payer: Self-pay | Admitting: *Deleted

## 2019-06-17 DIAGNOSIS — K838 Other specified diseases of biliary tract: Secondary | ICD-10-CM | POA: Diagnosis not present

## 2019-06-17 DIAGNOSIS — B1920 Unspecified viral hepatitis C without hepatic coma: Secondary | ICD-10-CM | POA: Diagnosis not present

## 2019-06-17 DIAGNOSIS — E785 Hyperlipidemia, unspecified: Secondary | ICD-10-CM | POA: Diagnosis not present

## 2019-06-17 DIAGNOSIS — Z8711 Personal history of peptic ulcer disease: Secondary | ICD-10-CM | POA: Diagnosis not present

## 2019-06-17 DIAGNOSIS — K219 Gastro-esophageal reflux disease without esophagitis: Secondary | ICD-10-CM | POA: Diagnosis not present

## 2019-06-17 DIAGNOSIS — M1990 Unspecified osteoarthritis, unspecified site: Secondary | ICD-10-CM | POA: Diagnosis not present

## 2019-06-17 DIAGNOSIS — Z981 Arthrodesis status: Secondary | ICD-10-CM | POA: Diagnosis not present

## 2019-06-17 DIAGNOSIS — I130 Hypertensive heart and chronic kidney disease with heart failure and stage 1 through stage 4 chronic kidney disease, or unspecified chronic kidney disease: Secondary | ICD-10-CM | POA: Diagnosis not present

## 2019-06-17 DIAGNOSIS — G4730 Sleep apnea, unspecified: Secondary | ICD-10-CM | POA: Diagnosis not present

## 2019-06-17 DIAGNOSIS — J449 Chronic obstructive pulmonary disease, unspecified: Secondary | ICD-10-CM | POA: Diagnosis not present

## 2019-06-17 DIAGNOSIS — E781 Pure hyperglyceridemia: Secondary | ICD-10-CM | POA: Diagnosis not present

## 2019-06-17 DIAGNOSIS — F17210 Nicotine dependence, cigarettes, uncomplicated: Secondary | ICD-10-CM | POA: Diagnosis not present

## 2019-06-17 DIAGNOSIS — D135 Benign neoplasm of extrahepatic bile ducts: Secondary | ICD-10-CM | POA: Diagnosis present

## 2019-06-17 DIAGNOSIS — Z560 Unemployment, unspecified: Secondary | ICD-10-CM | POA: Diagnosis not present

## 2019-06-17 DIAGNOSIS — N182 Chronic kidney disease, stage 2 (mild): Secondary | ICD-10-CM | POA: Diagnosis not present

## 2019-06-17 DIAGNOSIS — I509 Heart failure, unspecified: Secondary | ICD-10-CM | POA: Diagnosis not present

## 2019-06-17 DIAGNOSIS — K861 Other chronic pancreatitis: Secondary | ICD-10-CM | POA: Diagnosis not present

## 2019-06-17 DIAGNOSIS — F329 Major depressive disorder, single episode, unspecified: Secondary | ICD-10-CM | POA: Diagnosis not present

## 2019-06-17 DIAGNOSIS — R945 Abnormal results of liver function studies: Secondary | ICD-10-CM | POA: Diagnosis not present

## 2019-06-17 LAB — NOVEL CORONAVIRUS, NAA (HOSP ORDER, SEND-OUT TO REF LAB; TAT 18-24 HRS): SARS-CoV-2, NAA: NOT DETECTED

## 2019-06-20 ENCOUNTER — Other Ambulatory Visit: Payer: Self-pay

## 2019-06-20 ENCOUNTER — Encounter (HOSPITAL_COMMUNITY): Payer: Self-pay | Admitting: Certified Registered Nurse Anesthetist

## 2019-06-20 ENCOUNTER — Ambulatory Visit (HOSPITAL_COMMUNITY)
Admission: RE | Admit: 2019-06-20 | Discharge: 2019-06-20 | Disposition: A | Discharge status: Home / Self Care | Payer: Medicare Other | Attending: Gastroenterology | Admitting: Gastroenterology

## 2019-06-20 ENCOUNTER — Ambulatory Visit (HOSPITAL_COMMUNITY): Payer: Medicare Other

## 2019-06-20 ENCOUNTER — Encounter (HOSPITAL_COMMUNITY)
Admission: RE | Disposition: A | Discharge status: Home / Self Care | Payer: Self-pay | Source: Home / Self Care | Attending: Gastroenterology

## 2019-06-20 ENCOUNTER — Telehealth: Payer: Self-pay

## 2019-06-20 ENCOUNTER — Ambulatory Visit (HOSPITAL_COMMUNITY): Payer: Medicare Other | Admitting: Certified Registered Nurse Anesthetist

## 2019-06-20 DIAGNOSIS — E785 Hyperlipidemia, unspecified: Secondary | ICD-10-CM | POA: Insufficient documentation

## 2019-06-20 DIAGNOSIS — R945 Abnormal results of liver function studies: Secondary | ICD-10-CM | POA: Diagnosis not present

## 2019-06-20 DIAGNOSIS — R109 Unspecified abdominal pain: Secondary | ICD-10-CM

## 2019-06-20 DIAGNOSIS — I130 Hypertensive heart and chronic kidney disease with heart failure and stage 1 through stage 4 chronic kidney disease, or unspecified chronic kidney disease: Secondary | ICD-10-CM | POA: Diagnosis not present

## 2019-06-20 DIAGNOSIS — M1990 Unspecified osteoarthritis, unspecified site: Secondary | ICD-10-CM | POA: Insufficient documentation

## 2019-06-20 DIAGNOSIS — D490 Neoplasm of unspecified behavior of digestive system: Secondary | ICD-10-CM

## 2019-06-20 DIAGNOSIS — K3189 Other diseases of stomach and duodenum: Secondary | ICD-10-CM

## 2019-06-20 DIAGNOSIS — F329 Major depressive disorder, single episode, unspecified: Secondary | ICD-10-CM | POA: Insufficient documentation

## 2019-06-20 DIAGNOSIS — F17210 Nicotine dependence, cigarettes, uncomplicated: Secondary | ICD-10-CM | POA: Insufficient documentation

## 2019-06-20 DIAGNOSIS — D135 Benign neoplasm of extrahepatic bile ducts: Secondary | ICD-10-CM | POA: Diagnosis not present

## 2019-06-20 DIAGNOSIS — Z981 Arthrodesis status: Secondary | ICD-10-CM | POA: Insufficient documentation

## 2019-06-20 DIAGNOSIS — K219 Gastro-esophageal reflux disease without esophagitis: Secondary | ICD-10-CM | POA: Insufficient documentation

## 2019-06-20 DIAGNOSIS — B1920 Unspecified viral hepatitis C without hepatic coma: Secondary | ICD-10-CM | POA: Insufficient documentation

## 2019-06-20 DIAGNOSIS — N182 Chronic kidney disease, stage 2 (mild): Secondary | ICD-10-CM | POA: Insufficient documentation

## 2019-06-20 DIAGNOSIS — K838 Other specified diseases of biliary tract: Secondary | ICD-10-CM

## 2019-06-20 DIAGNOSIS — R7989 Other specified abnormal findings of blood chemistry: Secondary | ICD-10-CM

## 2019-06-20 DIAGNOSIS — G4730 Sleep apnea, unspecified: Secondary | ICD-10-CM | POA: Insufficient documentation

## 2019-06-20 DIAGNOSIS — I899 Noninfective disorder of lymphatic vessels and lymph nodes, unspecified: Secondary | ICD-10-CM | POA: Diagnosis not present

## 2019-06-20 DIAGNOSIS — I509 Heart failure, unspecified: Secondary | ICD-10-CM | POA: Insufficient documentation

## 2019-06-20 DIAGNOSIS — Z8711 Personal history of peptic ulcer disease: Secondary | ICD-10-CM | POA: Insufficient documentation

## 2019-06-20 DIAGNOSIS — E781 Pure hyperglyceridemia: Secondary | ICD-10-CM | POA: Insufficient documentation

## 2019-06-20 DIAGNOSIS — Z560 Unemployment, unspecified: Secondary | ICD-10-CM | POA: Insufficient documentation

## 2019-06-20 DIAGNOSIS — J449 Chronic obstructive pulmonary disease, unspecified: Secondary | ICD-10-CM | POA: Insufficient documentation

## 2019-06-20 DIAGNOSIS — K861 Other chronic pancreatitis: Secondary | ICD-10-CM | POA: Insufficient documentation

## 2019-06-20 HISTORY — DX: Heart failure, unspecified: I50.9

## 2019-06-20 HISTORY — PX: ERCP: SHX5425

## 2019-06-20 HISTORY — PX: SPHINCTEROTOMY: SHX5544

## 2019-06-20 HISTORY — PX: POLYPECTOMY: SHX5525

## 2019-06-20 HISTORY — PX: UPPER ESOPHAGEAL ENDOSCOPIC ULTRASOUND (EUS): SHX6562

## 2019-06-20 HISTORY — PX: SUBMUCOSAL LIFTING INJECTION: SHX6855

## 2019-06-20 HISTORY — PX: BILIARY STENT PLACEMENT: SHX5538

## 2019-06-20 HISTORY — PX: PANCREATIC STENT PLACEMENT: SHX5539

## 2019-06-20 HISTORY — PX: HEMOSTASIS CLIP PLACEMENT: SHX6857

## 2019-06-20 HISTORY — PX: ESOPHAGOGASTRODUODENOSCOPY (EGD) WITH PROPOFOL: SHX5813

## 2019-06-20 HISTORY — PX: ENDOSCOPIC MUCOSAL RESECTION: SHX6839

## 2019-06-20 SURGERY — ERCP, WITH INTERVENTION IF INDICATED
Anesthesia: General

## 2019-06-20 MED ORDER — OXYCODONE HCL 5 MG PO TABS
30.0000 mg | ORAL_TABLET | Freq: Once | ORAL | Status: AC
  Administered 2019-06-20: 12:00:00 30 mg via ORAL
  Filled 2019-06-20: qty 6

## 2019-06-20 MED ORDER — ONDANSETRON HCL 4 MG/2ML IJ SOLN
INTRAMUSCULAR | Status: DC | PRN
  Administered 2019-06-20: 4 mg via INTRAVENOUS

## 2019-06-20 MED ORDER — DEXAMETHASONE SODIUM PHOSPHATE 10 MG/ML IJ SOLN
INTRAMUSCULAR | Status: DC | PRN
  Administered 2019-06-20: 10 mg via INTRAVENOUS

## 2019-06-20 MED ORDER — OXYCODONE HCL 5 MG PO TABS: 30.0000 mg | ORAL_TABLET | Freq: Once | ORAL | Status: DC

## 2019-06-20 MED ORDER — OXYCODONE HCL 5 MG PO TABS
ORAL_TABLET | ORAL | Status: AC
  Filled 2019-06-20: qty 6

## 2019-06-20 MED ORDER — SODIUM CHLORIDE 0.9 % IV SOLN
1.5000 g | INTRAVENOUS | Status: AC
  Administered 2019-06-20: 1.5 g via INTRAVENOUS
  Filled 2019-06-20: qty 4

## 2019-06-20 MED ORDER — GLUCAGON HCL RDNA (DIAGNOSTIC) 1 MG IJ SOLR
INTRAMUSCULAR | Status: AC
  Filled 2019-06-20: qty 2

## 2019-06-20 MED ORDER — SODIUM CHLORIDE 0.9 % IV SOLN: INTRAVENOUS | Status: DC

## 2019-06-20 MED ORDER — MIDAZOLAM HCL 5 MG/5ML IJ SOLN
INTRAMUSCULAR | Status: DC | PRN
  Administered 2019-06-20: 2 mg via INTRAVENOUS

## 2019-06-20 MED ORDER — LACTATED RINGERS IV SOLN
INTRAVENOUS | Status: AC | PRN
  Administered 2019-06-20: 1000 mL via INTRAVENOUS
  Administered 2019-06-20: 07:00:00 via INTRAVENOUS

## 2019-06-20 MED ORDER — LIDOCAINE 2% (20 MG/ML) 5 ML SYRINGE
INTRAMUSCULAR | Status: DC | PRN
  Administered 2019-06-20: 80 mg via INTRAVENOUS

## 2019-06-20 MED ORDER — CIPROFLOXACIN IN D5W 400 MG/200ML IV SOLN
INTRAVENOUS | Status: AC
  Filled 2019-06-20: qty 200

## 2019-06-20 MED ORDER — GLUCAGON HCL RDNA (DIAGNOSTIC) 1 MG IJ SOLR
INTRAMUSCULAR | Status: DC | PRN
  Administered 2019-06-20 (×4): 0.25 mg via INTRAVENOUS

## 2019-06-20 MED ORDER — ROCURONIUM BROMIDE 10 MG/ML (PF) SYRINGE
PREFILLED_SYRINGE | INTRAVENOUS | Status: DC | PRN
  Administered 2019-06-20: 80 mg via INTRAVENOUS

## 2019-06-20 MED ORDER — SODIUM CHLORIDE 0.9 % IV SOLN
INTRAVENOUS | Status: DC | PRN
  Administered 2019-06-20: 30 mL

## 2019-06-20 MED ORDER — FENTANYL CITRATE (PF) 250 MCG/5ML IJ SOLN
INTRAMUSCULAR | Status: DC | PRN
  Administered 2019-06-20 (×2): 50 ug via INTRAVENOUS

## 2019-06-20 MED ORDER — OMEPRAZOLE 40 MG PO CPDR: 40.0000 mg | DELAYED_RELEASE_CAPSULE | Freq: Two times a day (BID) | ORAL | 4 refills | Status: DC

## 2019-06-20 MED ORDER — PHENYLEPHRINE 40 MCG/ML (10ML) SYRINGE FOR IV PUSH (FOR BLOOD PRESSURE SUPPORT)
PREFILLED_SYRINGE | INTRAVENOUS | Status: DC | PRN
  Administered 2019-06-20 (×2): 80 ug via INTRAVENOUS

## 2019-06-20 MED ORDER — PROPOFOL 10 MG/ML IV BOLUS
INTRAVENOUS | Status: DC | PRN
  Administered 2019-06-20: 150 mg via INTRAVENOUS

## 2019-06-20 MED ORDER — SUGAMMADEX SODIUM 200 MG/2ML IV SOLN
INTRAVENOUS | Status: DC | PRN
  Administered 2019-06-20: 200 mg via INTRAVENOUS

## 2019-06-20 MED ORDER — INDOMETHACIN 50 MG RE SUPP
RECTAL | Status: AC
  Filled 2019-06-20: qty 2

## 2019-06-20 NOTE — Op Note (Addendum)
[facility] Patient Name: Jerome Adams Procedure Date : 06/20/2019 MRN: [id number] Attending MD: Justice Britain , MD Date of Birth: 1960/09/23 CSN: [id number] Age: 58 Admit Type: Inpatient Procedure:                ERCP Indications:              Periampullary tumor, Biliary dilation on Computed                            Tomogram Scan, Abnormal liver function test, For                            therapy of periampullary tumor Providers:                Justice Britain, MD, Vista Lawman, RN, Elspeth Cho Tech., Technician, Garrison Columbus, CRNA Referring MD:             Milus Banister, MD, Antonietta Jewel, Stark Klein MD,                            MD Medicines:                General Anesthesia, Unasyn 1.5 g IV, Indomethacin                            751 mg PR Complications:            No immediate complications. Estimated Blood Loss:     Estimated blood loss was minimal. Procedure:                Pre-Anesthesia Assessment:                           - Prior to the procedure, a History and Physical                            was performed, and patient medications and                            allergies were reviewed. The patient's tolerance of                            previous anesthesia was also reviewed. The risks                            and benefits of the procedure and the sedation                            options and risks were discussed with the patient.                            All questions were answered, and informed consent  was obtained. Prior Anticoagulants: The patient has                            taken no previous anticoagulant or antiplatelet                            agents except for aspirin. ASA Grade Assessment:                            III - A patient with severe systemic disease. After                            reviewing the risks and benefits, the patient was              deemed in satisfactory condition to undergo the                            procedure.                           After obtaining informed consent, the scope was                            passed under direct vision. Throughout the                            procedure, the patient's blood pressure, pulse, and                            oxygen saturations were monitored continuously. The                            GIF-H190 [id number]) Olympus gastroscope was                            introduced through the mouth, and used to inject                            contrast into and used to locate the major papilla.                            The TJF-Q180V ([id number]) Olympus duodenoscope was                            introduced through the mouth, and used to inject                            contrast into and used to inject contrast into the                            bile duct and ventral pancreatic duct. The ERCP was                            accomplished without difficulty. The  patient                            tolerated the procedure. Scope In: Scope Out: Findings:      The scout film was normal.      The scope was passed through the upper GI tract without discovering UGI       findings (previously noted on EUS report). A medium-sized       frondlike/villous mass measuring eighteen mm in diameter was found at       the major papilla.      A short 0.035 inch Soft Jagwire was passed into the biliary tree. The       Autotome sphincterotome was passed over the guidewire and the bile duct       was then deeply cannulated. Contrast was injected. I personally       interpreted the bile duct images. Ductal flow of contrast was adequate.       Image quality was adequate. Contrast extended to the hepatic ducts.       Opacification of the entire biliary tree except for the cystic duct and       gallbladder was successful. The main bile duct was moderately dilated.       The largest diameter  was 12 mm.      A short 0.035 inch Soft Jagwire was passed into the ventral pancreatic       duct. The ventral pancreatic duct was then deeply cannulated with the       Autotome sphincterotome. Contrast was injected. I personally interpreted       the pancreatic duct images. Ductal flow of contrast was adequate. Image       quality was adequate. Contrast extended to the pancreatic duct.       Opacification of the entire pancreatic ductal system was successful. The       maximum diameter of the ducts was between 2-3 mm.      A 5 mm biliary sphincterotomy was made with a monofilament Autotome       sphincterotome using ERBE electrocautery. There was no       post-sphincterotomy bleeding.      A 3 mm ventral pancreatic sphincterotomy was made with a monofilament       Autotome sphincterotome using ERBE electrocautery. There was no       post-sphincterotomy bleeding.      The papillary region was successfully injected with 16 mL Orise gel       through the ERCP scope for lesion assessment, and this injection       appeared to lift the lesion adequately. Using a snare, the major papilla       was grasped. The papilla was then resected using ERBE electrocautery.       Resection and retrieval were complete.      A short 0.035 inch Soft Jagwire was passed into the ventral pancreatic       duct. One 5 Fr by 3 cm temporary plastic pancreatic stent with a single       external pigtail was placed into the ventral pancreatic duct. The stent       was in good position.      A short 0.035 inch Soft Jagwire was passed into the biliary tree. One 10       Fr by 5 cm plastic biliary stent with a single external flap and a  single internal flap was placed into the common bile duct. Bile flowed       through the stent. The stent was in good position.      There was a region in the very distal portion of the resection site that       had a bit of a deeper defect. Through the ERCP scope one hemostatic clip        was successfully placed (MR conditional) in that region. There was no       bleeding at the end of the procedure.      The duodenoscope was withdrawn from the patient. 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                           - The entire main bile duct was moderately dilated.                           - A biliary sphincterotomy was performed.                           - A pancreatic sphincterotomy was performed.                           - An area successfully injected to lift the region.                            - Snare papillectomy of the major papilla was                            performed. Resection and retrieval were complete.                           - One temporary plastic pancreatic stent was placed                            into the ventral pancreatic duct.                           - One plastic biliary stent was placed into the                            common bile duct.                           - One hemostatic clip was successfully placed (MR                            conditional) in the ampulla. Recommendation:           - The patient will be observed post-procedure,  until all discharge criteria are met.                           - Discharge patient to home.                           - Patient has a contact number available for                            emergencies. The signs and symptoms of potential                            delayed complications were discussed with the                            patient. Return to normal activities tomorrow.                            Written discharge instructions were provided to the                            patient.                           - Check liver enzymes (AST, ALT, alkaline                            phosphatase, bilirubin) in 1 week.                           - Observe  patient's clinical course.                           - Watch for pancreatitis, bleeding, perforation,                            and cholangitis.                           - Follow up Pathology.                           - Increase PPI to 40 mg BID for next 44-month to                            aid in healing of mucosa.                           - No NSAIDs for 2-weeks to decrease risk of                            bleeding post intervention.                           - Patient will need a KUB 2-view in 10-14 days to  ensure pancreatic stent has migrated successfully.                            If still present at that time will need to be                            scheduled for EGD with stent pull.                           - Repeat ERCP in 3-4 months for surveillance and to                            remove the biliary stent and proceed with further                            therapy if recurrent adenoma is found.                           - The findings and recommendations were discussed                            with the patient.                           - The findings and recommendations were discussed                            with the designated responsible adult. Procedure Code(s):        --- Professional ---                           [phone], Endoscopic retrograde                            cholangiopancreatography (ERCP); with placement of                            endoscopic stent into biliary or pancreatic duct,                            including pre- and post-dilation and guide wire                            passage, when performed, including sphincterotomy,                            when performed, each stent                           43274, 59, Endoscopic retrograde                            cholangiopancreatography (ERCP); with placement of  endoscopic stent into biliary or pancreatic duct,                             including pre- and post-dilation and guide wire                            passage, when performed, including sphincterotomy,                            when performed, each stent                           43254, Esophagogastroduodenoscopy, flexible,                            transoral; with endoscopic mucosal resection                           [phone], Unlisted procedure, biliary tract Diagnosis Code(s):        --- Professional ---                           K83.8, Other specified diseases of biliary tract                           D49.0, Neoplasm of unspecified behavior of                            digestive system                           R94.5, Abnormal results of liver function studies CPT copyright 2019 American [employer] Association. All rights reserved. The codes documented in this report are preliminary and upon coder review may  be revised to meet current compliance requirements. Justice Britain, MD 06/20/2019 10:07:56 AM Number of Addenda: 0

## 2019-06-20 NOTE — Anesthesia Procedure Notes (Signed)

## 2019-06-20 NOTE — Transfer of Care (Signed)
Immediate Anesthesia Transfer of Care Note  Patient: KADIAN KENNERSON  Procedure(s) Performed: ENDOSCOPIC RETROGRADE CHOLANGIOPANCREATOGRAPHY (ERCP) (N/A ) UPPER ESOPHAGEAL ENDOSCOPIC ULTRASOUND (EUS) (N/A ) BILIARY STENT PLACEMENT PANCREATIC STENT PLACEMENT SPHINCTEROTOMY SUBMUCOSAL LIFTING INJECTION HEMOSTASIS CLIP PLACEMENT POLYPECTOMY  Patient Location: Endoscopy Unit  Anesthesia Type:General  Level of Consciousness: awake, alert  and oriented  Airway & Oxygen Therapy: Patient Spontanous Breathing and Patient connected to nasal cannula oxygen  Post-op Assessment: Report given to RN, Post -op Vital signs reviewed and stable and Patient moving all extremities X 4  Post vital signs: Reviewed and stable  Last Vitals:  Vitals Value Taken Time  BP 105/54 06/20/19 1004  Temp    Pulse 65 06/20/19 1004  Resp 15 06/20/19 1004  SpO2 98 % 06/20/19 1004  Vitals shown include unvalidated device data.  Last Pain:  Vitals:   06/20/19 1004  TempSrc:   PainSc: 0-No pain      Patients Stated Pain Goal: 2 ([id number])  Complications: No apparent anesthesia complications

## 2019-06-20 NOTE — Telephone Encounter (Signed)
KUB has been added to Epic  08/05/19 at 3:40 pm appt with Dr Ardis Hughs.

## 2019-06-20 NOTE — Telephone Encounter (Signed)
-----   Message from Irving Copas., MD sent at 06/20/2019  4:28 PM EST ----- Regarding: Follow up Jerome Adams, Patient needs a KUB in 10-14 days to ensure the Pancreatic Duct stent has migrated out, otherwise, DJ or I will need to remove it via EGD. Please set up a follow up in clinic with DJ in 4-6 weeks to see how he is doing. Please call patient tomorrow and update DJ and I on how he has done post-procedure. ERCP with me in approximately 54-months (unless pathology dictates sooner need). Thanks. GM

## 2019-06-20 NOTE — Op Note (Signed)
[facility] Patient Name: Jerome Adams Procedure Date : 06/20/2019 MRN: [id number] Attending MD: Justice Britain , MD Date of Birth: Mar 09, 1961 CSN: [id number] Age: 58 Admit Type: Inpatient Procedure:                Upper EUS Indications:              Mucosal mass/polyp involving the major papilla                            (found on endoscopy), Common bile duct dilation                            (acquired) seen on CT scan Providers:                Justice Britain, MD, Vista Lawman, RN, Elspeth Cho Tech., Technician, Garrison Columbus, CRNA Referring MD:             Dr. Ardis Hughs, Dr. Barry Dienes, Dr. Sheryle Hail Medicines:                General Anesthesia Complications:            No immediate complications. Estimated Blood Loss:     Estimated blood loss was minimal. Procedure:                Pre-Anesthesia Assessment:                           - Prior to the procedure, a History and Physical                            was performed, and patient medications and                            allergies were reviewed. The patient's tolerance of                            previous anesthesia was also reviewed. The risks                            and benefits of the procedure and the sedation                            options and risks were discussed with the patient.                            All questions were answered, and informed consent                            was obtained. Prior Anticoagulants: The patient has                            taken no previous anticoagulant or antiplatelet  agents except for aspirin. ASA Grade Assessment:                            III - A patient with severe systemic disease. After                            reviewing the risks and benefits, the patient was                            deemed in satisfactory condition to undergo the                            procedure.       After obtaining informed consent, the endoscope was                            passed under direct vision. Throughout the                            procedure, the patient's blood pressure, pulse, and                            oxygen saturations were monitored continuously. The                            GIF-H190 [id number] Olympus gastroscope was                            introduced through the mouth, and advanced to the                            second part of duodenum. The TJF-Q180V [id number]                            Olympus duodenoscope was introduced through the                            mouth, and advanced to the second part of duodenum.                            The [id number]) Olympus Radial EUS scope                            was introduced through the mouth, and advanced to                            the duodenum for ultrasound examination from the                            stomach and duodenum. The upper EUS was                            accomplished without difficulty. The patient  tolerated the procedure. Scope In: Scope Out: Findings:      ENDOSCOPIC FINDING: :      No gross lesions were noted in the entire esophagus.      Patchy mildly erythematous mucosa without bleeding was found in the       gastric antrum - previously biopsied.      No other gross lesions were noted in the entire examined stomach.      A medium-sized frond-like/villous and polypoid mass with no bleeding was       found in the ampulla.      No other gross lesions were noted in the entire examined duodenum.      ENDOSONOGRAPHIC FINDING: :      A hypoechoic round mass-lesion was identified endosonographically in the       ampulla. The mass measured 17 mm by 12 mm in maximal cross-sectional       diameter. The lesion extended from the mucosa to the muscularis mucosa.       The endosonographic borders were well-defined. No invasion of the       muscularis  was noted.      There was dilation in the common bile duct which measured 5.7 mm -> 11       mm. There was no evidence of intraductal growth on the EUS of the       ampullary lesion.      The pancreatic duct had a dilated endosonographic appearance in the       pancreatic head but was otherwise normal. The pancreatic duct in the       head measured 3.5 mm -> 4.1 mm. The pancreatic duct in the neck measured       2.1 mm. The pancreatic duct in the body measured 2.0 mm.      Endosonographic imaging in the entire pancreas parenchyma showed no       mass-lesion.      Endosonographic imaging in the visualized portion of the liver showed no       mass-lesion.      No malignant-appearing lymph nodes were visualized in the celiac region       (level 20), perigastric region and peripancreatic region.      The celiac region was visualized. Impression:               EGD Impression:                           - No gross lesions in esophagus.                           - Erythematous mucosa in the antrum.                           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                           - No other gross lesions in the entire examined  duodenum.                           EUS Impression:                           - A mass was found at the ampulla. A tissue                            diagnosis was obtained prior to this exam. This is                            consistent with an adenoma.                           - There was dilation in the common bile duct which                            measured up to 11 mm. No evidence of ampullary                            growth into the biliary tree.                           - The pancreatic duct had a dilated endosonographic                            appearance in the pancreatic head but was otherwise                            normal..                           -  No malignant-appearing lymph nodes were                            visualized in the celiac region (level 20),                            perigastric region and peripancreatic region. Recommendation:           - Proceed to scheduled ERCP for attempt at                            ampullectomy.                           - The findings and recommendations were discussed                            with the patient.                           - The findings and recommendations were discussed  with the designated responsible adult. Procedure Code(s):        --- Professional ---                           [phone], Esophagogastroduodenoscopy, flexible,                            transoral; with endoscopic ultrasound examination                            limited to the esophagus, stomach or duodenum, and                            adjacent structures Diagnosis Code(s):        --- Professional ---                           K31.89, Other diseases of stomach and duodenum                           K83.8, Other specified diseases of biliary tract                           I89.9, Noninfective disorder of lymphatic vessels                            and lymph nodes, unspecified                           K83.9, Disease of biliary tract, unspecified                           R93.3, Abnormal findings on diagnostic imaging of                            other parts of digestive tract CPT copyright 2019 American Medical Association. All rights reserved. The codes documented in this report are preliminary and upon coder review may  be revised to meet current compliance requirements. Justice Britain, MD 06/20/2019 10:18:18 AM Number of Addenda: 0

## 2019-06-20 NOTE — H&P (Signed)
GASTROENTEROLOGY PROCEDURE H&P NOTE   Primary Care Physician: Antonietta Jewel, MD  HPI: Jerome Adams is a 58 y.o. male who presents for EUS with ERCP and possible Ampullectomy.  Past Medical History:  Diagnosis Date  . Alcohol abuse   . Arthritis    "joints" (03/30/2013)  . CHF (congestive heart failure) ([place])   . Chronic lower back pain   . Chronic pancreatitis ([place])   . CKD (chronic kidney disease), stage II   . COPD (chronic obstructive pulmonary disease) ([place]) 07/31/2014  . Depression   . Drug abuse ([place])    a. Remote drug abuse (cocaine). UDS 07/2014 neg for cocaine.  . Essential hypertension   . GERD (gastroesophageal reflux disease)    takes  otc  . Hepatitis C 2010   a. never treated - undectable virus 12/2013.  [name] Hyperlipidemia   . Hypertriglyceridemia   . NICM (nonischemic cardiomyopathy) ([place])    a. 12/2007 Echo: EF 45-50%;  b. 07/2014 Echo: EF 20-25%, diff HK, mod dil LA, sev dil RA, mild TR, PASP 31 mmHg.  . PUD (peptic ulcer disease)   . Sleep apnea    was tested here at [facility].Marland KitchenMarland KitchenMarland KitchenNever heard anymore about it (03/30/2013)  . Spinal cord stimulator status    has had for 1 yr--inserted by [facility]  . Tobacco abuse   . Transaminitis    Past Surgical History:  Procedure Laterality Date  . ANTERIOR CERVICAL DECOMP/DISCECTOMY FUSION  04/05/2012   Procedure: ANTERIOR CERVICAL DECOMPRESSION/DISCECTOMY FUSION 1 LEVEL;  Surgeon: Elaina Hoops, MD;  Location: [facility];  Service: Neurosurgery;  Laterality: N/A;  Cervical five-six anterior cervical decompression and fusion  . BIOPSY  03/31/2019   Procedure: BIOPSY;  Surgeon: Milus Banister, MD;  Location: [facility];  Service: Endoscopy;;  . COLONOSCOPY WITH PROPOFOL N/A 08/28/2016   Procedure: COLONOSCOPY WITH PROPOFOL;  Surgeon: Milus Banister, MD;  Location: [facility] ENDOSCOPY;  Service: Endoscopy;  Laterality: N/A;  . ESOPHAGOGASTRODUODENOSCOPY (EGD) WITH PROPOFOL N/A 08/28/2016   Procedure: ESOPHAGOGASTRODUODENOSCOPY (EGD)  WITH PROPOFOL;  Surgeon: Milus Banister, MD;  Location: [facility] ENDOSCOPY;  Service: Endoscopy;  Laterality: N/A;  . ESOPHAGOGASTRODUODENOSCOPY (EGD) WITH PROPOFOL N/A 03/31/2019   Procedure: ESOPHAGOGASTRODUODENOSCOPY (EGD) WITH PROPOFOL;  Surgeon: Milus Banister, MD;  Location: [facility] ENDOSCOPY;  Service: Endoscopy;  Laterality: N/A;  . EUS N/A 03/31/2019   Procedure: UPPER ENDOSCOPIC ULTRASOUND (EUS) RADIAL;  Surgeon: Milus Banister, MD;  Location: [facility] ENDOSCOPY;  Service: Endoscopy;  Laterality: N/A;  . FOOT SURGERY Right 2012?   "took piece off that was hurting me" (03/30/2013)  . HARDWARE REMOVAL     "took screw out of my back; it had damaged my nerve" (03/30/2013)--lumber  . INCISION AND DRAINAGE ABSCESS Left    "lower jaw, tooth abscess" (03/30/2013)  . LEFT HEART CATHETERIZATION WITH CORONARY ANGIOGRAM N/A 08/03/2014   Procedure: LEFT HEART CATHETERIZATION WITH CORONARY ANGIOGRAM;  Surgeon: Blane Ohara, MD;  Location: [facility] CATH LAB;  Service: Cardiovascular;  Laterality: N/A;  . POSTERIOR LUMBAR FUSION     "L4-5" (03/31/2103)  . SPINAL CORD STIMULATOR IMPLANT  11/14/2012  . WRIST SURGERY Right 1980's   repair of tendons and nerves   No current facility-administered medications for this encounter.    Allergies  Allergen Reactions  . Hydrocodone-Acetaminophen Itching   Family History  Problem Relation Age of Onset  . Heart disease Father        H/o MI followed by SCD @ 68.  [name] Heart  disease Sister        Died suddenly @ 60 - h/o CHF s/p AICD.  [name] Breast cancer Sister   . Bone cancer Mother   . Esophageal cancer Neg Hx   . Stomach cancer Neg Hx   . Colon cancer Neg Hx   . Inflammatory bowel disease Neg Hx   . Liver disease Neg Hx   . Pancreatic cancer Neg Hx   . Rectal cancer Neg Hx    Social History   Socioeconomic History  . Marital status: Divorced    Spouse name: Not on file  . Number of children: 1  . Years of education: Not on file  . Highest education level: Not on file   Occupational History  . Occupation: UNEMPLOYEED    Employer: UNEMPLOYED    Comment: on disability.   Social Needs  . Financial resource strain: Not on file  . Food insecurity    Worry: Not on file    Inability: Not on file  . Transportation needs    Medical: Not on file    Non-medical: Not on file  Tobacco Use  . Smoking status: Current Some Day Smoker    Packs/day: 0.75    Years: 40.00    Pack years: 30.00    Types: Cigarettes  . Smokeless tobacco: Never Used  Substance and Sexual Activity  . Alcohol use: Not Currently    Alcohol/week: 4.0 standard drinks    Types: 4 Cans of beer per week    Comment: Has been drinking since age 34.  Has averaged 4 22 oz cans of beer daily until late 2015.  . Drug use: No    Types: Cocaine    Comment: 03/30/2013 "[place]"  . Sexual activity: Not on file  Lifestyle  . Physical activity    Days per week: Not on file    Minutes per session: Not on file  . Stress: Not on file  Relationships  . Social Herbalist on phone: Not on file    Gets together: Not on file    Attends religious service: Not on file    Active member of club or organization: Not on file    Attends meetings of clubs or organizations: Not on file    Relationship status: Not on file  . Intimate partner violence    Fear of current or ex partner: Not on file    Emotionally abused: Not on file    Physically abused: Not on file    Forced sexual activity: Not on file  Other Topics Concern  . Not on file  Social History Narrative   Lives in [place] by himself.  Does not routinely exercise.    Physical Exam: Vital signs in last 24 hours:     GEN: NAD EYE: Sclerae anicteric ENT: MMM CV: Non-tachycardic GI: Soft, NT/ND NEURO:  Alert & Oriented x 3  Lab Results: No results for input(s): WBC, HGB, HCT, PLT in the last 72 hours. BMET No results for input(s): NA, K, CL, CO2, GLUCOSE, BUN, CREATININE, CALCIUM in the last 72 hours. LFT No results for  input(s): PROT, ALBUMIN, AST, ALT, ALKPHOS, BILITOT, BILIDIR, IBILI in the last 72 hours. PT/INR No results for input(s): LABPROT, INR in the last 72 hours.   Impression / Plan: This is a 58 y.o.male who presents for EUS with ERCP and possible Ampullectomy.  The risks of EUS including bleeding, infection, aspiration pneumonia and intestinal perforation were discussed as was  the possibility it may not give a definitive diagnosis.  If a biopsy of the pancreas is done as part of the EUS, there is an additional risk of pancreatitis at the rate of about 1%.  It was explained that procedure related pancreatitis is typically mild, although can be severe and even life threatening, which is why we do not perform random pancreatic biopsies and only biopsy a lesion we feel is concerning enough to warrant the risk.  The risks of an ERCP were discussed at length, including but not limited to the risk of perforation, bleeding, abdominal pain, post-ERCP pancreatitis (while usually mild can be severe and even life threatening).  The risks and benefits of endoscopic evaluation were discussed with the patient; these include but are not limited to the risk of perforation, infection, bleeding, missed lesions, lack of diagnosis, severe illness requiring hospitalization, as well as anesthesia and sedation related illnesses.  The patient is agreeable to proceed.    Justice Britain, MD [facility] Advanced [facility] Office # [phone]

## 2019-06-20 NOTE — Anesthesia Preprocedure Evaluation (Addendum)
Anesthesia Evaluation  Patient identified by MRN, date of birth, ID band Patient awake    Reviewed: Allergy & Precautions, NPO status , Patient's Chart, lab work & pertinent test results, reviewed documented beta blocker date and time   Airway Mallampati: III  TM Distance: >3 FB Neck ROM: Full    Dental no notable dental hx. (+) Teeth Intact, Dental Advisory Given   Pulmonary sleep apnea , COPD, Current Smoker and Patient abstained from smoking.,    Pulmonary exam normal breath sounds clear to auscultation       Cardiovascular hypertension, Pt. on home beta blockers and Pt. on medications + CAD and +CHF  Normal cardiovascular exam Rhythm:Regular Rate:Normal  TTE 01/2019  1. The left ventricle has mild-moderately reduced systolic function, with an ejection fraction of 40-45%. The cavity size was normal. Left ventricular diastolic Doppler parameters are consistent with impaired relaxation.  2. The right ventricle has normal systolic function. The cavity was mildly enlarged. There is no increase in right ventricular wall thickness.  3. Left atrial size was severely dilated.  4. Right atrial size was severely dilated.  5. Mild thickening of the mitral valve leaflet.  6. The aortic valve is tricuspid. Mild thickening of the aortic valve. Mild calcification of the aortic valve.  7. The aorta is normal in size and structure.  Stress Test 2016 1. No reversible ischemia or infarction. The inferior wall abnormality is most likely due to diaphragmatic attenuation artifact. 2.  Severe left ventricular diffuse hypokinesis. 3. Left ventricular ejection fraction 24% 4. High-risk stress test findings, due to severely reduced LV systolic function. The findings are more consistent with nonischemic cardiomyopathy, but cannot fully exclude "balanced" ischemia*.   Neuro/Psych PSYCHIATRIC DISORDERS Depression negative neurological ROS     GI/Hepatic PUD, GERD  Medicated and Controlled,(+) Hepatitis -, C  Endo/Other  negative endocrine ROS  Renal/GU Renal InsufficiencyRenal disease  negative genitourinary   Musculoskeletal  (+) Arthritis ,   Abdominal   Peds  Hematology negative hematology ROS (+)   Anesthesia Other Findings   Reproductive/Obstetrics                            Anesthesia Physical Anesthesia Plan  ASA: III  Anesthesia Plan: General   Post-op Pain Management:    Induction: Intravenous  PONV Risk Score and Plan: Midazolam, Dexamethasone and Ondansetron  Airway Management Planned: Oral ETT  Additional Equipment:   Intra-op Plan:   Post-operative Plan: Extubation in OR  Informed Consent: I have reviewed the patients History and Physical, chart, labs and discussed the procedure including the risks, benefits and alternatives for the proposed anesthesia with the patient or authorized representative who has indicated his/her understanding and acceptance.     Dental advisory given  Plan Discussed with: CRNA  Anesthesia Plan Comments:         Anesthesia Quick Evaluation

## 2019-06-20 NOTE — Discharge Instructions (Signed)
YOU HAD AN ENDOSCOPIC PROCEDURE TODAY: Refer to the procedure report and other information in the discharge instructions given to you for any specific questions about what was found during the examination. If this information does not answer your questions, please call [place] office at [phone] to clarify.   YOU SHOULD EXPECT: Some feelings of bloating in the abdomen. Passage of more gas than usual. Walking can help get rid of the air that was put into your GI tract during the procedure and reduce the bloating. If you had a lower endoscopy (such as a colonoscopy or flexible sigmoidoscopy) you may notice spotting of blood in your stool or on the toilet paper. Some abdominal soreness may be present for a day or two, also.  DIET: Your first meal following the procedure should be a light meal and then it is ok to progress to your normal diet. A half-sandwich or bowl of soup is an example of a good first meal. Heavy or fried foods are harder to digest and may make you feel nauseous or bloated. Drink plenty of fluids but you should avoid alcoholic beverages for 24 hours. If you had a esophageal dilation, please see attached instructions for diet.    ACTIVITY: Your care partner should take you home directly after the procedure. You should plan to take it easy, moving slowly for the rest of the day. You can resume normal activity the day after the procedure however YOU SHOULD NOT DRIVE, use power tools, machinery or perform tasks that involve climbing or major physical exertion for 24 hours (because of the sedation medicines used during the test).   SYMPTOMS TO REPORT IMMEDIATELY: A gastroenterologist can be reached at any hour. Please call [phone]  for any of the following symptoms:   Following upper endoscopy (EGD, EUS, ERCP, esophageal dilation) Vomiting of blood or coffee ground material  New, significant abdominal pain  New, significant chest pain or pain under the shoulder blades  Painful or  persistently difficult swallowing  New shortness of breath  Black, tarry-looking or red, bloody stools  FOLLOW UP:  If any biopsies were taken you will be contacted by phone or by letter within the next 1-3 weeks. Call [phone]  if you have not heard about the biopsies in 3 weeks.  Please also call with any specific questions about appointments or follow up tests.  

## 2019-06-21 ENCOUNTER — Encounter: Payer: Self-pay | Admitting: Gastroenterology

## 2019-06-21 ENCOUNTER — Telehealth: Payer: Self-pay | Admitting: Gastroenterology

## 2019-06-21 ENCOUNTER — Ambulatory Visit (INDEPENDENT_AMBULATORY_CARE_PROVIDER_SITE_OTHER)
Admission: RE | Admit: 2019-06-21 | Discharge: 2019-06-21 | Disposition: A | Discharge status: Home / Self Care | Payer: Medicare Other | Source: Ambulatory Visit | Attending: Gastroenterology | Admitting: Gastroenterology

## 2019-06-21 ENCOUNTER — Other Ambulatory Visit (INDEPENDENT_AMBULATORY_CARE_PROVIDER_SITE_OTHER): Payer: Medicare Other

## 2019-06-21 ENCOUNTER — Telehealth: Payer: Self-pay

## 2019-06-21 DIAGNOSIS — R1010 Upper abdominal pain, unspecified: Secondary | ICD-10-CM

## 2019-06-21 LAB — CBC WITH DIFFERENTIAL/PLATELET
Basophils Absolute: 0.1 10*3/uL (ref 0.0–0.1)
Basophils Relative: 0.9 % (ref 0.0–3.0)
Eosinophils Absolute: 0.1 10*3/uL (ref 0.0–0.7)
Eosinophils Relative: 0.6 % (ref 0.0–5.0)
HCT: 43.3 % (ref 39.0–52.0)
Hemoglobin: 13.5 g/dL (ref 13.0–17.0)
Lymphocytes Relative: 24 % (ref 12.0–46.0)
Lymphs Abs: 3.1 10*3/uL (ref 0.7–4.0)
MCHC: 31.2 g/dL (ref 30.0–36.0)
MCV: 79.4 fl (ref 78.0–100.0)
Monocytes Absolute: 0.8 10*3/uL (ref 0.1–1.0)
Monocytes Relative: 6.3 % (ref 3.0–12.0)
Neutro Abs: 8.9 10*3/uL — ABNORMAL HIGH (ref 1.4–7.7)
Neutrophils Relative %: 68.2 % (ref 43.0–77.0)
Platelets: 217 10*3/uL (ref 150.0–400.0)
RBC: 5.46 Mil/uL (ref 4.22–5.81)
RDW: 14.8 % (ref 11.5–15.5)
WBC: 13.1 10*3/uL — ABNORMAL HIGH (ref 4.0–10.5)

## 2019-06-21 LAB — COMPREHENSIVE METABOLIC PANEL
ALT: 31 U/L (ref 0–53)
AST: 23 U/L (ref 0–37)
Albumin: 4.2 g/dL (ref 3.5–5.2)
Alkaline Phosphatase: 107 U/L (ref 39–117)
BUN: 27 mg/dL — ABNORMAL HIGH (ref 6–23)
CO2: 32 mEq/L (ref 19–32)
Calcium: 9.2 mg/dL (ref 8.4–10.5)
Chloride: 99 mEq/L (ref 96–112)
Creatinine, Ser: 1.32 mg/dL (ref 0.40–1.50)
GFR: 67.39 mL/min (ref >60.00)
Glucose, Bld: 146 mg/dL — ABNORMAL HIGH (ref 70–99)
Potassium: 4 mEq/L (ref 3.5–5.1)
Sodium: 138 mEq/L (ref 135–145)
Total Bilirubin: 0.4 mg/dL (ref 0.2–1.2)
Total Protein: 7.9 g/dL (ref 6.0–8.3)

## 2019-06-21 LAB — LIPASE: Lipase: 98 U/L — ABNORMAL HIGH (ref 11.0–59.0)

## 2019-06-21 LAB — AMYLASE: Amylase: 71 U/L (ref 27–131)

## 2019-06-21 LAB — SURGICAL PATHOLOGY

## 2019-06-21 MED ORDER — OXYCODONE HCL 30 MG PO TABS: 30.0000 mg | ORAL_TABLET | ORAL | 0 refills | Status: AC | PRN

## 2019-06-21 NOTE — Telephone Encounter (Signed)
I was able to reach the patient this afternoon. The patient feels that his discomfort has eased off a bit and is now currently a 4-5 out of 10. He has been able to drink 1 Gatorade today.  But he has not drank or eaten anything else. The patient is worried about coming into the hospital be due to the rising Covid issues but understands that it may be necessary. He is not having any nausea or vomiting. We discussed the risks of pancreatitis that even though his lipase does not meet the criteria for true documentation of pancreatitis his pain in his procedure is such that he is at high risk for potential even just mild pancreatitis.  Hopefully with the discomfort easing off a bit he is just getting through this but we will have to see closely. I asked him to consider coming into the hospital versus continuing management as an outpatient for the next 24 hours. He would like to try to stay home.  He is going to increase his oxycodone to every 3-4 hours and I will send a new prescription (he will use what he has at home for now) the next 3 days to try and help him get through and have extra dosing as needed for this. He needs to be aggressive with his hydration he is going to drink to more 32 ounce Gatorade's plus or minus some water to be intaked today. He may try to eat a low-fat diet moving forward but today I would like him to just be on soups or broths. He understands that if the pain progresses and is intolerable and not manageable with the medication dosing he has at home and/or if his unable to tolerate adequate hydration (seeing that his urine remains clear) then he needs to come in for further evaluation. He understands the risks of pancreatitis as we discussed at clinic and also after the procedure yesterday. I think it is unlikely that he has an underlying perforation based on the imaging but if things persist into tomorrow when we call him and he will need to come in for further evaluation in the  emergency department. Advanced RN Gerarda Fraction, please reach out to patient tomorrow in the morning and let Dr. Ardis Hughs and I know how he is doing. The patient is very appreciative for all the care that he is received and will follow through with what ever we feel is in his best interest.   Jerome Britain, MD [facility] Advanced Endoscopy Office # [phone]

## 2019-06-21 NOTE — Anesthesia Postprocedure Evaluation (Signed)
Anesthesia Post Note  Patient: AADYN MCKINSTRY  Procedure(s) Performed: ENDOSCOPIC RETROGRADE CHOLANGIOPANCREATOGRAPHY (ERCP) (N/A ) UPPER ESOPHAGEAL ENDOSCOPIC ULTRASOUND (EUS) (N/A ) BILIARY STENT PLACEMENT PANCREATIC STENT PLACEMENT SPHINCTEROTOMY SUBMUCOSAL LIFTING INJECTION HEMOSTASIS CLIP PLACEMENT POLYPECTOMY ENDOSCOPIC MUCOSAL RESECTION ESOPHAGOGASTRODUODENOSCOPY (EGD) WITH PROPOFOL (N/A )     Patient location during evaluation: Endoscopy Anesthesia Type: General Level of consciousness: awake and alert Pain management: pain level controlled Vital Signs Assessment: post-procedure vital signs reviewed and stable Respiratory status: spontaneous breathing, nonlabored ventilation, respiratory function stable and patient connected to nasal cannula oxygen Cardiovascular status: blood pressure returned to baseline and stable Postop Assessment: no apparent nausea or vomiting Anesthetic complications: no    Last Vitals:  Vitals:   06/20/19 1034 06/20/19 1044  BP: 106/79 113/74  Pulse: 63 65  Resp: 17 12  Temp:    SpO2: 98% 98%    Last Pain:  Vitals:   06/20/19 1004  TempSrc: Oral  PainSc: 0-No pain   Pain Goal: Patients Stated Pain Goal: 2 (06/20/19 0705)                 Takumi Din L Avonell Lenig

## 2019-06-21 NOTE — Telephone Encounter (Signed)
-----   Message from Milus Banister, MD sent at 06/21/2019  9:35 AM EST ----- Regarding: RE: Follow up Again, gave thank you for your help with him.  Anija Brickner please forward the KUB results to me I will find time in my schedule to remove the pancreatic duct stent if it is still in place.  Also office visit with me in 4 to 6 weeks.  Gave I will update you on how he is doing at the time of that office visit.  Thanks  ----- Message ----- From: Irving Copas., MD Sent: 06/20/2019   4:28 PM EST To: Milus Banister, MD, Timothy Lasso, RN Subject: Follow up                                      Jerome Adams, Patient needs a KUB in 10-14 days to ensure the Pancreatic Duct stent has migrated out, otherwise, DJ or I will need to remove it via EGD. Please set up a follow up in clinic with DJ in 4-6 weeks to see how he is doing. Please call patient tomorrow and update DJ and I on how he has done post-procedure. ERCP with me in approximately 62-months (unless pathology dictates sooner need). Thanks. GM

## 2019-06-21 NOTE — Telephone Encounter (Signed)
I have reviewed the patient's labs and his KUB. He has a slight elevation in his lipase up to 98 but this is not even 2 times the upper limit of normal. His liver tests are normal otherwise. White count slightly elevated at 13.6. KUB unremarkable bowel gas pattern with both the pancreas stent and CBD stents in place. I tried to reach the patient at 4 PM and was not able to talk with him other than leave a voicemail. I will try to reach him before 5 PM. I will leave a voicemail again if I cannot reach him and if his pain is so significant that he cannot tolerate eating or drinking or sleeping he needs to come in for further evaluation and potentially supportive management with IV fluids and pain control. If he is able to maintain his hydration then maybe we can give him a short course of outpatient opioid therapy in an attempt of trying to keep him home rather than coming into the emergency department. I will try and reach him before 5 PM. Patty, if you hear from the patient please forward him to my office number.  Justice Britain, MD [facility] Office # [id number]

## 2019-06-21 NOTE — Telephone Encounter (Signed)
See alternate note  

## 2019-06-21 NOTE — Telephone Encounter (Signed)
Thank you Patty for reaching out to him. Agree with DJ about labs and imaging. If so severe he can't make it for labs then needs to come into the hospital to evaluate for post-procedural complications. I had told him to call us if anything developed over course of yesterday in case he got pancreatitis from our procedure. His oxycodone is for his back pain. Forward results to DJ or myself, I will be on lookout for them. GM

## 2019-06-21 NOTE — Telephone Encounter (Signed)
Just spoke with patient. He is on way in. He has had similar type of discomfort previously but not to this extent. Discomfort is in upper abdomen but is not radiating directly to the back. He has lower back pain which is chronic for which he takes his Oxycodone. We will review his labs and imaging and then decide need for potential admission. GM

## 2019-06-21 NOTE — Telephone Encounter (Signed)
Orders in Epic and pt aware

## 2019-06-21 NOTE — Telephone Encounter (Signed)
The pt has a lot of gas feels very bloated and with epigastric pain 9-10/10.  Pain comes and goes about every 15 min at that intensity.  The pain is constant at 6/10. He has been unable to sleep.  He is taking 30 mg of oxycodone every 4 hours it only eases it up for an hour.  He felt good yesterday.  Has been on a clear liquid diet today. Nothing makes it feel better. Nothing makes it worse.  He tried Maalox without relief.  No fever. Please advise

## 2019-06-21 NOTE — Telephone Encounter (Signed)
He needs blood work, CBC, complete metabolic profile, amylase and lipase as well as 2 view KUB for abdominal pain after ERCP.  thanks

## 2019-06-21 NOTE — Addendum Note (Signed)
Addended by: Justice Britain on: 06/21/2019 05:31 PM   Modules accepted: Orders

## 2019-06-22 NOTE — Telephone Encounter (Signed)
Thank you for update. GM 

## 2019-06-22 NOTE — Telephone Encounter (Signed)
Per Dr Rush Landmark the pt will have to pay out of pocket for an early refill of $37.39 at [facility] [address].  The pt was advised and says he will go and pick it up today.

## 2019-06-22 NOTE — Telephone Encounter (Signed)
They need approval to allow him to refill early.

## 2019-06-22 NOTE — Telephone Encounter (Signed)
The pt did get some sleep last night but woke up at 730 am with pain and cramping that was worse than it has been 11/10. He took omeprazole and two 30 mg oxycodone and it did relieve the pain. He says that the pain now is constant at 3/10.  He tried to get oxycodone prescription but the pharmacy will not release until 11/27.  He states that the pharmacy requires a call from Dr Rush Landmark to release the prescription today.  He notes that at this time he feels more comfortable with increasing the pain med.  He prefers to stay out of the ED but says he will go if the pain is as bad as it was this morning.  He is hopeful that the increase in pain med will help to keep him home for the holidays.

## 2019-06-22 NOTE — Telephone Encounter (Signed)
I called and spoke with Pharmacy. There will be a fee for extra dosing, but we will reach out to patient and discuss this with him. They cannot refill his already long-term medication because it is actually filled out at a different pharmacy. I will reach out to patient this afternoon after procedures are complete. GM

## 2019-06-22 NOTE — Telephone Encounter (Signed)
Pt calling back states pharmacy will not fill his script for pain medication until 06/24/19 unless we call and say it is ok to fill early.

## 2019-06-22 NOTE — Telephone Encounter (Signed)
Patty, please call pharmacy and let me know what they need.  I sent everything yesterday and did the normal Opioid process.  I'll be in procedures for a few more hours. Thanks. GM

## 2019-06-25 NOTE — Telephone Encounter (Signed)
This is for completion of prior notation.  I tried to call patient on 11/25 in afternoon without success. He has had mutliple discussions with my team and myself over the last few days, and if unable to tolerate continued outpatient status, then needs to come in over the long weekend. I will have our team reach out to patient on Monday next week, if still having issues then needs CTAP with IV/PO contrast on Monday/Tuesday and consider need for evaluation in the ED thereafter.

## 2019-06-27 NOTE — Telephone Encounter (Signed)
I called and spoke with the patient this morning. He is doing well. No longer having the issues that he was having last week. No significant nausea or vomiting and he is eating and drinking well. Have asked him to please come in later this week for a KUB two-view to evaluate the pancreas stent so that if it is still present at that point in time that Dr. Ardis Hughs or myself can plan on removing that.  The bile duct stent hopefully is still in place. Patty, please reach out to patient later this week and remind him to come in for the x-ray and make sure that there is a 2 view KUB pending. Thanks. GM

## 2019-06-27 NOTE — Telephone Encounter (Signed)
The pt has a KUB in [place] and will be called on Wed as a reminder

## 2019-07-01 ENCOUNTER — Telehealth: Payer: Self-pay | Admitting: Gastroenterology

## 2019-07-01 DIAGNOSIS — T859XXA Unspecified complication of internal prosthetic device, implant and graft, initial encounter: Secondary | ICD-10-CM

## 2019-07-04 ENCOUNTER — Ambulatory Visit (INDEPENDENT_AMBULATORY_CARE_PROVIDER_SITE_OTHER)
Admission: RE | Admit: 2019-07-04 | Discharge: 2019-07-04 | Disposition: A | Discharge status: Home / Self Care | Payer: Medicare Other | Source: Ambulatory Visit | Attending: Gastroenterology | Admitting: Gastroenterology

## 2019-07-04 ENCOUNTER — Other Ambulatory Visit: Payer: Self-pay

## 2019-07-04 DIAGNOSIS — K838 Other specified diseases of biliary tract: Secondary | ICD-10-CM | POA: Diagnosis not present

## 2019-07-04 NOTE — Telephone Encounter (Signed)
The pt has been advised that the order for the x ray is in the sytem and he can come in at his convenience . The pt has been advised of the information and verbalized understanding.

## 2019-07-07 ENCOUNTER — Other Ambulatory Visit: Payer: Self-pay | Admitting: Cardiology

## 2019-08-05 ENCOUNTER — Encounter: Payer: Self-pay | Admitting: Gastroenterology

## 2019-08-05 ENCOUNTER — Other Ambulatory Visit: Payer: Self-pay | Admitting: Cardiology

## 2019-08-05 ENCOUNTER — Other Ambulatory Visit (INDEPENDENT_AMBULATORY_CARE_PROVIDER_SITE_OTHER): Payer: Medicare Other

## 2019-08-05 ENCOUNTER — Ambulatory Visit (INDEPENDENT_AMBULATORY_CARE_PROVIDER_SITE_OTHER): Payer: Medicare Other | Admitting: Gastroenterology

## 2019-08-05 VITALS — BP 118/76 | HR 60 | Temp 96.5°F | Ht 70.0 in | Wt 217.4 lb

## 2019-08-05 DIAGNOSIS — D135 Benign neoplasm of extrahepatic bile ducts: Secondary | ICD-10-CM

## 2019-08-05 LAB — CBC WITH DIFFERENTIAL/PLATELET
Basophils Absolute: 0.1 10*3/uL (ref 0.0–0.1)
Basophils Relative: 1.3 % (ref 0.0–3.0)
Eosinophils Absolute: 0.2 10*3/uL (ref 0.0–0.7)
Eosinophils Relative: 2.1 % (ref 0.0–5.0)
HCT: 42.3 % (ref 39.0–52.0)
Hemoglobin: 13.6 g/dL (ref 13.0–17.0)
Lymphocytes Relative: 42.4 % (ref 12.0–46.0)
Lymphs Abs: 3.1 10*3/uL (ref 0.7–4.0)
MCHC: 32.1 g/dL (ref 30.0–36.0)
MCV: 76.9 fl — ABNORMAL LOW (ref 78.0–100.0)
Monocytes Absolute: 0.5 10*3/uL (ref 0.1–1.0)
Monocytes Relative: 6.7 % (ref 3.0–12.0)
Neutro Abs: 3.5 10*3/uL (ref 1.4–7.7)
Neutrophils Relative %: 47.5 % (ref 43.0–77.0)
Platelets: 223 10*3/uL (ref 150.0–400.0)
RBC: 5.5 Mil/uL (ref 4.22–5.81)
RDW: 14.5 % (ref 11.5–15.5)
WBC: 7.3 10*3/uL (ref 4.0–10.5)

## 2019-08-05 LAB — COMPREHENSIVE METABOLIC PANEL
ALT: 12 U/L (ref 0–53)
AST: 16 U/L (ref 0–37)
Albumin: 4.6 g/dL (ref 3.5–5.2)
Alkaline Phosphatase: 109 U/L (ref 39–117)
BUN: 16 mg/dL (ref 6–23)
CO2: 31 mEq/L (ref 19–32)
Calcium: 9.6 mg/dL (ref 8.4–10.5)
Chloride: 100 mEq/L (ref 96–112)
Creatinine, Ser: 1.13 mg/dL (ref 0.40–1.50)
GFR: 80.59 mL/min (ref >60.00)
Glucose, Bld: 112 mg/dL — ABNORMAL HIGH (ref 70–99)
Potassium: 3.7 mEq/L (ref 3.5–5.1)
Sodium: 140 mEq/L (ref 135–145)
Total Bilirubin: 0.5 mg/dL (ref 0.2–1.2)
Total Protein: 8 g/dL (ref 6.0–8.3)

## 2019-08-05 NOTE — Progress Notes (Signed)
Review of pertinent gastrointestinal problems: 1.  Ampullary adenoma causing biliary dilation, mild LFT elevation.  This was discovered August 2020 with slightly elevated liver tests, ultrasound showing dilated bile duct.  He was unable to have MRI due to implanted metal and so he underwent endoscopic ultrasound instead.  EGD with endoscopic ultrasound September 2020 Dr. Ardis Hughs found 1.5 cm ampullary nodule that was not invasive appearing.  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  Status post endoscopic removal Dr. Rush Landmark June 20, 2019 with placement of pancreatic duct stent and bile duct stent. 2.  Adenomatous colon polyps, colonoscopy 2013 found 2 subcentimeter adenomas.  Colonoscopy February 2018 single subcentimeter adenoma removed  HPI: This is a very pleasant 59 year old man whom I last saw at the time of an EGD with endoscopic ultrasound September 2020 at which time I found a 1.5 cm ampullary nodule that proved to be an adenoma.  He eventually underwent ERCP with Dr. Rush Landmark with mucosal resection of the adenoma, biliary stent placement, pancreatic duct stent placement.  Pancreatic duct stent migrated naturally.  Since then he has felt fairly well.  He does have some left upper quadrant discomfort and some mild constipation.  He was pushing and straining to move his bowels last week and saw some minor blood on toilet paper.  He has had no fevers or chills.  His weight has been overall stable   ROS: complete GI ROS as described in HPI, all other review negative.  Constitutional:  No unintentional weight loss   Past Medical History:  Diagnosis Date  . Alcohol abuse   . Arthritis    "joints" (03/30/2013)  . CHF (congestive heart failure) ([place])   . Chronic lower back pain   . Chronic pancreatitis ([place])   . CKD (chronic kidney disease), stage II   . COPD (chronic obstructive pulmonary disease) ([place]) 07/31/2014  . Depression   . Drug abuse ([place])    a. Remote  drug abuse (cocaine). UDS 07/2014 neg for cocaine.  . Essential hypertension   . GERD (gastroesophageal reflux disease)    takes  otc  . Hepatitis C 2010   a. never treated - undectable virus 12/2013.  [name] Hyperlipidemia   . Hypertriglyceridemia   . NICM (nonischemic cardiomyopathy) ([place])    a. 12/2007 Echo: EF 45-50%;  b. 07/2014 Echo: EF 20-25%, diff HK, mod dil LA, sev dil RA, mild TR, PASP 31 mmHg.  . PUD (peptic ulcer disease)   . Sleep apnea    was tested here at [facility].Marland KitchenMarland KitchenMarland KitchenNever heard anymore about it (03/30/2013)  . Spinal cord stimulator status    has had for 1 yr--inserted by [facility]  . Tobacco abuse   . Transaminitis     Past Surgical History:  Procedure Laterality Date  . ANTERIOR CERVICAL DECOMP/DISCECTOMY FUSION  04/05/2012   Procedure: ANTERIOR CERVICAL DECOMPRESSION/DISCECTOMY FUSION 1 LEVEL;  Surgeon: Elaina Hoops, MD;  Location: [place] NEURO ORS;  Service: Neurosurgery;  Laterality: N/A;  Cervical five-six anterior cervical decompression and fusion  . BILIARY STENT PLACEMENT  06/20/2019   Procedure: BILIARY STENT PLACEMENT;  Surgeon: Irving Copas., MD;  Location: [place];  Service: Gastroenterology;;  . BIOPSY  03/31/2019   Procedure: BIOPSY;  Surgeon: Milus Banister, MD;  Location: [facility] ENDOSCOPY;  Service: Endoscopy;;  . COLONOSCOPY WITH PROPOFOL N/A 08/28/2016   Procedure: COLONOSCOPY WITH PROPOFOL;  Surgeon: Milus Banister, MD;  Location: [facility] ENDOSCOPY;  Service: Endoscopy;  Laterality: N/A;  . ENDOSCOPIC MUCOSAL RESECTION  06/20/2019  Procedure: ENDOSCOPIC MUCOSAL RESECTION;  Surgeon: Rush Landmark Telford Nab., MD;  Location: [facility] ENDOSCOPY;  Service: Gastroenterology;;  . ERCP N/A 06/20/2019   Procedure: ENDOSCOPIC RETROGRADE CHOLANGIOPANCREATOGRAPHY (ERCP);  Surgeon: Irving Copas., MD;  Location: [place];  Service: Gastroenterology;  Laterality: N/A;  . ESOPHAGOGASTRODUODENOSCOPY (EGD) WITH PROPOFOL N/A 08/28/2016   Procedure:  ESOPHAGOGASTRODUODENOSCOPY (EGD) WITH PROPOFOL;  Surgeon: Milus Banister, MD;  [facility]OS[facility];  Service: Endoscopy;  Laterality: N/A;  . ESOPHAGOGASTRODUODENOSCOPY (EGD) WITH PROPOFOL N/A 03/31/2019   Procedure: ESOPHAGOGASTRODUODENOSCOPY (EGD) WITH PROPOFOL;  Surgeon: Milus Banister, MD;  [facility];  Service: Endoscopy;  Laterality: N/A;  . ESOPHAGOGASTRODUODENOSCOPY (EGD) WITH PROPOFOL N/A 06/20/2019   Procedure: ESOPHAGOGASTRODUODENOSCOPY (EGD) WITH PROPOFOL;  Surgeon: Rush Landmark Telford Nab., MD;  Location: [place];  Service: Gastroenterology;  Laterality: N/A;  . EUS N/A 03/31/2019   Procedure: UPPER ENDOSCOPIC ULTRASOUND (EUS) RADIAL;  Surgeon: Milus Banister, MD;  [facility] ENDOSCOPY;  Service: Endoscopy;  Laterality: N/A;  . FOOT SURGERY Right 2012?   "took piece off that was hurting me" (03/30/2013)  . HARDWARE REMOVAL     "took screw out of my back; it had damaged my nerve" (03/30/2013)--lumber  . HEMOSTASIS CLIP PLACEMENT  06/20/2019   Procedure: HEMOSTASIS CLIP PLACEMENT;  Surgeon: Irving Copas., MD;  Location: [place];  Service: Gastroenterology;;  . INCISION AND DRAINAGE ABSCESS Left    "lower jaw, tooth abscess" (03/30/2013)  . LEFT HEART CATHETERIZATION WITH CORONARY ANGIOGRAM N/A 08/03/2014   Procedure: LEFT HEART CATHETERIZATION WITH CORONARY ANGIOGRAM;  Surgeon: Blane Ohara, MD;  Location: [facility] CATH LAB;  Service: Cardiovascular;  Laterality: N/A;  . PANCREATIC STENT PLACEMENT  06/20/2019   Procedure: PANCREATIC STENT PLACEMENT;  Surgeon: Irving Copas., MD;  Location: [place];  Service: Gastroenterology;;  . POLYPECTOMY  06/20/2019   Procedure: POLYPECTOMY;  Surgeon: Irving Copas., MD;  Location: [place];  Service: Gastroenterology;;  . POSTERIOR LUMBAR FUSION     "L4-5" (03/31/2103)  . SPHINCTEROTOMY  06/20/2019   Procedure: SPHINCTEROTOMY;  Surgeon: Mansouraty, Telford Nab., MD;  Location: [place];   Service: Gastroenterology;;  . SPINAL CORD STIMULATOR IMPLANT  11/14/2012  . SUBMUCOSAL LIFTING INJECTION  06/20/2019   Procedure: SUBMUCOSAL LIFTING INJECTION;  Surgeon: Rush Landmark Telford Nab., MD;  Location: [place];  Service: Gastroenterology;;  . UPPER ESOPHAGEAL ENDOSCOPIC ULTRASOUND (EUS) N/A 06/20/2019   Procedure: UPPER ESOPHAGEAL ENDOSCOPIC ULTRASOUND (EUS);  Surgeon: Irving Copas., MD;  Location: [place];  Service: Gastroenterology;  Laterality: N/A;  . WRIST SURGERY Right 1980's   repair of tendons and nerves    Current Outpatient Medications  Medication Sig Dispense Refill  . aspirin EC 81 MG EC tablet Take 1 tablet (81 mg total) by mouth daily. 30 tablet 0  . ENTRESTO 49-51 MG Take 1 tablet by mouth twice daily. (Patient taking differently: Take 1 tablet by mouth 2 (two) times daily. ) 60 tablet 5  . gemfibrozil (LOPID) 600 MG tablet Take 1 tablet (600 mg total) by mouth 2 (two) times daily before a meal. 60 tablet 5  . metoprolol succinate (TOPROL-XL) 100 MG 24 hr tablet Take 1 tablet by mouth daily. Take with or immediately following a meal. 90 tablet 3  . Multiple Vitamin (MULTIVITAMIN WITH MINERALS) TABS tablet Take 1 tablet by mouth daily.    [name] omega-3 acid ethyl esters (LOVAZA) 1 G capsule Take 1 capsule (1 g total) by mouth 2 (two) times daily. 60 capsule 5  . omeprazole (PRILOSEC) 40  MG capsule Take 1 capsule (40 mg total) by mouth 2 (two) times daily before a meal. 60 capsule 4  . oxycodone (ROXICODONE) 30 MG immediate release tablet Take 30 mg by mouth 4 (four) times daily as needed for pain.   0  . rosuvastatin (CRESTOR) 40 MG tablet Take 40 mg by mouth daily.    . sertraline (ZOLOFT) 100 MG tablet Take 100 mg by mouth daily.     [name] spironolactone (ALDACTONE) 25 MG tablet Take 1 tablet (25 mg total) by mouth every other day. (Patient taking differently: Take 12.5 mg by mouth daily. ) 15 tablet 3  . torsemide (DEMADEX) 20 MG tablet Take 2 tablets by  mouth daily . 60 tablet 3  . Vitamin D, Ergocalciferol, (DRISDOL) 1.25 MG (50000 UT) CAPS capsule Take 50,000 Units by mouth every Friday.     No current facility-administered medications for this visit.    Allergies as of 08/05/2019 - Review Complete 08/05/2019  Allergen Reaction Noted  . Hydrocodone-acetaminophen Itching     Family History  Problem Relation Age of Onset  . Heart disease Father        H/o MI followed by SCD @ 2.  [name] Heart disease Sister        Died suddenly @ 2 - h/o CHF s/p AICD.  [name] Breast cancer Sister   . Bone cancer Mother   . Esophageal cancer Neg Hx   . Stomach cancer Neg Hx   . Colon cancer Neg Hx   . Inflammatory bowel disease Neg Hx   . Liver disease Neg Hx   . Pancreatic cancer Neg Hx   . Rectal cancer Neg Hx     Social History   Socioeconomic History  . Marital status: Divorced    Spouse name: Not on file  . Number of children: 1  . Years of education: Not on file  . Highest education level: Not on file  Occupational History  . Occupation: UNEMPLOYEED    Employer: UNEMPLOYED    Comment: on disability.   Tobacco Use  . Smoking status: Current Some Day Smoker    Packs/day: 0.75    Years: 40.00    Pack years: 30.00    Types: Cigarettes  . Smokeless tobacco: Never Used  Substance and Sexual Activity  . Alcohol use: Not Currently    Alcohol/week: 4.0 standard drinks    Types: 4 Cans of beer per week    Comment: Has been drinking since age 28.  Has averaged 4 22 oz cans of beer daily until late 2015.  . Drug use: No    Types: Cocaine    Comment: 03/30/2013 "[place]"  . Sexual activity: Not on file  Other Topics Concern  . Not on file  Social History Narrative   Lives in [place] by himself.  Does not routinely exercise.   Social Determinants of Health   Financial Resource Strain:   . Difficulty of Paying Living Expenses: Not on file  Food Insecurity:   . Worried About Charity fundraiser in the Last Year: Not on file  . Ran  Out of Food in the Last Year: Not on file  Transportation Needs:   . Lack of Transportation (Medical): Not on file  . Lack of Transportation (Non-Medical): Not on file  Physical Activity:   . Days of Exercise per Week: Not on file  . Minutes of Exercise per Session: Not on file  Stress:   . Feeling of Stress :  Not on file  Social Connections:   . Frequency of Communication with Friends and Family: Not on file  . Frequency of Social Gatherings with Friends and Family: Not on file  . Attends Religious Services: Not on file  . Active Member of Clubs or Organizations: Not on file  . Attends Archivist Meetings: Not on file  . Marital Status: Not on file  Intimate Partner Violence:   . Fear of Current or Ex-Partner: Not on file  . Emotionally Abused: Not on file  . Physically Abused: Not on file  . Sexually Abused: Not on file     Physical Exam: BP 118/76   Pulse 60   Temp (!) 96.5 F (35.8 C)   Ht 5\' 10"  (1.778 m)   Wt 217 lb 6 oz (98.6 kg)   BMI 31.19 kg/m  Constitutional: generally well-appearing Psychiatric: alert and oriented x3 Abdomen: soft, nontender, nondistended, no obvious ascites, no peritoneal signs, normal bowel sounds No peripheral edema noted in lower extremities  Assessment and plan: 59 y.o. male with ampullary adenoma status post ERCP with mucosal resection November 2020  He had some pains following the ERCP, possibly minor pancreatitis, these improved without him needing to be admitted to the hospital.  Since then he has felt quite well except for some very minor constipation related issues with left upper quadrant pains and some minor rectal bleeding that was self-limited.  Note he had a colonoscopy about 2 to 3 years ago and I do not think that needs to be repeated now.  I recommended he get on fiber supplement for his minor constipation.  I am going to repeat labs today hopefully confirming that his liver tests continue to remain normal.  We will  arrange repeat ERCP to see if there is any residual adenomatous mucosa, possibly remove the previously placed biliary stent.  That will be late February which would be about 3-1/2 months since his ampullary endoscopic resection.  Please see the "Patient Instructions" section for addition details about the plan.  Owens Loffler, MD [facility] 08/05/2019, 4:02 PM

## 2019-08-05 NOTE — Patient Instructions (Signed)
Your provider has requested that you go to the basement level for lab work before leaving today. Press "B" on the elevator. The lab is located at the first door on the left as you exit the elevator.  Begin taking Citrucel powder daily.  We will contact you regarding a repeat ERCP

## 2019-09-02 ENCOUNTER — Other Ambulatory Visit: Payer: Self-pay

## 2019-09-02 ENCOUNTER — Telehealth: Payer: Self-pay

## 2019-09-02 DIAGNOSIS — D135 Benign neoplasm of extrahepatic bile ducts: Secondary | ICD-10-CM

## 2019-09-02 DIAGNOSIS — Z4689 Encounter for fitting and adjustment of other specified devices: Secondary | ICD-10-CM

## 2019-09-02 NOTE — Telephone Encounter (Signed)
-----   Message from Timothy Lasso, RN sent at 08/05/2019  4:36 PM EST -----  ----- Message ----- From: Milus Banister, MD Sent: 08/05/2019   4:26 PM EST To: Timothy Lasso, RN, Irving Copas., MD  Chester Holstein,  Today was a big day for me to see patients that he was helping with apparently.  This man is also doing well.  You did mucosal ERCP with endoscopic resection of an ampullary adenoma in the November.  Per your previous plan it looks like he will need repeat ERCP about the end of February.  I would also support this not being called elective.  That biliary stent could get occluded and start causing issues and so I think the ERCP should be done as previously planned.  thanks  [employer], see above.

## 2019-09-02 NOTE — Telephone Encounter (Signed)
ERCP scheduled, pt instructed and medications reviewed.  Patient instructions mailed to home.  Patient to call with any questions or concerns.  

## 2019-09-02 NOTE — Telephone Encounter (Signed)
ERCP scheduled for 2/24 at 830 am [facility] with Dr Rush Landmark COVID testing on 2/20.

## 2019-09-04 ENCOUNTER — Other Ambulatory Visit: Payer: Self-pay | Admitting: Cardiology

## 2019-09-17 ENCOUNTER — Other Ambulatory Visit (HOSPITAL_COMMUNITY)
Admission: RE | Admit: 2019-09-17 | Discharge: 2019-09-17 | Disposition: A | Discharge status: Home / Self Care | Payer: Medicare Other | Source: Ambulatory Visit | Attending: Gastroenterology | Admitting: Gastroenterology

## 2019-09-17 DIAGNOSIS — Z20822 Contact with and (suspected) exposure to covid-19: Secondary | ICD-10-CM | POA: Diagnosis not present

## 2019-09-17 DIAGNOSIS — Z01812 Encounter for preprocedural laboratory examination: Secondary | ICD-10-CM | POA: Insufficient documentation

## 2019-09-17 LAB — SARS CORONAVIRUS 2 (TAT 6-24 HRS): SARS Coronavirus 2: NEGATIVE

## 2019-09-19 ENCOUNTER — Telehealth: Payer: Self-pay | Admitting: Gastroenterology

## 2019-09-19 NOTE — Telephone Encounter (Signed)
The pt had questions regarding ERCP instructions.  We discussed all concerns and questions answered.  He will call back if he has further questions.

## 2019-09-19 NOTE — Telephone Encounter (Signed)
Pt has questions regarding ERCP scheduled 09/21/19.

## 2019-09-21 ENCOUNTER — Telehealth: Payer: Self-pay | Admitting: Gastroenterology

## 2019-09-21 ENCOUNTER — Encounter (HOSPITAL_COMMUNITY)
Admission: RE | Disposition: A | Discharge status: Home / Self Care | Payer: Self-pay | Source: Home / Self Care | Attending: Gastroenterology

## 2019-09-21 ENCOUNTER — Ambulatory Visit (HOSPITAL_COMMUNITY): Payer: Medicare Other | Admitting: Registered Nurse

## 2019-09-21 ENCOUNTER — Other Ambulatory Visit: Payer: Self-pay

## 2019-09-21 ENCOUNTER — Encounter (HOSPITAL_COMMUNITY): Payer: Self-pay | Admitting: Gastroenterology

## 2019-09-21 ENCOUNTER — Ambulatory Visit (HOSPITAL_COMMUNITY): Payer: Medicare Other

## 2019-09-21 ENCOUNTER — Ambulatory Visit (HOSPITAL_COMMUNITY)
Admission: RE | Admit: 2019-09-21 | Discharge: 2019-09-21 | Disposition: A | Discharge status: Home / Self Care | Payer: Medicare Other | Attending: Gastroenterology | Admitting: Gastroenterology

## 2019-09-21 DIAGNOSIS — I2510 Atherosclerotic heart disease of native coronary artery without angina pectoris: Secondary | ICD-10-CM | POA: Diagnosis not present

## 2019-09-21 DIAGNOSIS — K219 Gastro-esophageal reflux disease without esophagitis: Secondary | ICD-10-CM | POA: Insufficient documentation

## 2019-09-21 DIAGNOSIS — Z4659 Encounter for fitting and adjustment of other gastrointestinal appliance and device: Secondary | ICD-10-CM | POA: Diagnosis not present

## 2019-09-21 DIAGNOSIS — N182 Chronic kidney disease, stage 2 (mild): Secondary | ICD-10-CM | POA: Insufficient documentation

## 2019-09-21 DIAGNOSIS — D135 Benign neoplasm of extrahepatic bile ducts: Secondary | ICD-10-CM

## 2019-09-21 DIAGNOSIS — Z09 Encounter for follow-up examination after completed treatment for conditions other than malignant neoplasm: Secondary | ICD-10-CM | POA: Insufficient documentation

## 2019-09-21 DIAGNOSIS — D490 Neoplasm of unspecified behavior of digestive system: Secondary | ICD-10-CM | POA: Diagnosis not present

## 2019-09-21 DIAGNOSIS — G4730 Sleep apnea, unspecified: Secondary | ICD-10-CM | POA: Diagnosis not present

## 2019-09-21 DIAGNOSIS — K831 Obstruction of bile duct: Secondary | ICD-10-CM

## 2019-09-21 DIAGNOSIS — F17210 Nicotine dependence, cigarettes, uncomplicated: Secondary | ICD-10-CM | POA: Insufficient documentation

## 2019-09-21 DIAGNOSIS — J449 Chronic obstructive pulmonary disease, unspecified: Secondary | ICD-10-CM | POA: Insufficient documentation

## 2019-09-21 DIAGNOSIS — K838 Other specified diseases of biliary tract: Secondary | ICD-10-CM | POA: Diagnosis not present

## 2019-09-21 DIAGNOSIS — I130 Hypertensive heart and chronic kidney disease with heart failure and stage 1 through stage 4 chronic kidney disease, or unspecified chronic kidney disease: Secondary | ICD-10-CM | POA: Diagnosis not present

## 2019-09-21 DIAGNOSIS — I509 Heart failure, unspecified: Secondary | ICD-10-CM | POA: Diagnosis not present

## 2019-09-21 DIAGNOSIS — Z4689 Encounter for fitting and adjustment of other specified devices: Secondary | ICD-10-CM

## 2019-09-21 HISTORY — PX: BILIARY STENT PLACEMENT: SHX5538

## 2019-09-21 HISTORY — PX: STENT REMOVAL: SHX6421

## 2019-09-21 HISTORY — PX: ESOPHAGOGASTRODUODENOSCOPY (EGD) WITH PROPOFOL: SHX5813

## 2019-09-21 HISTORY — PX: PANCREATIC STENT PLACEMENT: SHX5539

## 2019-09-21 HISTORY — PX: REMOVAL OF STONES: SHX5545

## 2019-09-21 HISTORY — PX: BIOPSY: SHX5522

## 2019-09-21 HISTORY — PX: ENDOSCOPIC RETROGRADE CHOLANGIOPANCREATOGRAPHY (ERCP) WITH PROPOFOL: SHX5810

## 2019-09-21 LAB — GLUCOSE, CAPILLARY: Glucose-Capillary: 119 mg/dL — ABNORMAL HIGH (ref 70–99)

## 2019-09-21 SURGERY — ENDOSCOPIC RETROGRADE CHOLANGIOPANCREATOGRAPHY (ERCP) WITH PROPOFOL
Anesthesia: General

## 2019-09-21 MED ORDER — ROCURONIUM BROMIDE 10 MG/ML (PF) SYRINGE
PREFILLED_SYRINGE | INTRAVENOUS | Status: DC | PRN
  Administered 2019-09-21: 40 mg via INTRAVENOUS

## 2019-09-21 MED ORDER — LACTATED RINGERS IV SOLN
INTRAVENOUS | Status: DC
  Administered 2019-09-21: 1000 mL via INTRAVENOUS

## 2019-09-21 MED ORDER — GLUCAGON HCL RDNA (DIAGNOSTIC) 1 MG IJ SOLR
INTRAMUSCULAR | Status: AC
  Filled 2019-09-21: qty 2

## 2019-09-21 MED ORDER — LIDOCAINE 2% (20 MG/ML) 5 ML SYRINGE
INTRAMUSCULAR | Status: DC | PRN
  Administered 2019-09-21: 80 mg via INTRAVENOUS

## 2019-09-21 MED ORDER — INDOMETHACIN 50 MG RE SUPP
RECTAL | Status: DC | PRN
  Administered 2019-09-21: 100 mg via RECTAL

## 2019-09-21 MED ORDER — INDOMETHACIN 50 MG RE SUPP
RECTAL | Status: AC
  Filled 2019-09-21: qty 2

## 2019-09-21 MED ORDER — DEXAMETHASONE SODIUM PHOSPHATE 10 MG/ML IJ SOLN
INTRAMUSCULAR | Status: DC | PRN
  Administered 2019-09-21: 10 mg via INTRAVENOUS

## 2019-09-21 MED ORDER — GLUCAGON HCL RDNA (DIAGNOSTIC) 1 MG IJ SOLR
INTRAMUSCULAR | Status: DC | PRN
  Administered 2019-09-21 (×3): .25 mg via INTRAVENOUS

## 2019-09-21 MED ORDER — PROPOFOL 10 MG/ML IV BOLUS
INTRAVENOUS | Status: AC
  Filled 2019-09-21: qty 40

## 2019-09-21 MED ORDER — OXYCODONE HCL 30 MG PO TABS: 30.0000 mg | ORAL_TABLET | Freq: Four times a day (QID) | ORAL | 0 refills | Status: AC | PRN

## 2019-09-21 MED ORDER — LACTATED RINGERS IV SOLN: INTRAVENOUS | Status: DC

## 2019-09-21 MED ORDER — CIPROFLOXACIN IN D5W 400 MG/200ML IV SOLN
INTRAVENOUS | Status: DC | PRN
  Administered 2019-09-21: 400 mg via INTRAVENOUS

## 2019-09-21 MED ORDER — MIDAZOLAM HCL 2 MG/2ML IJ SOLN
INTRAMUSCULAR | Status: AC
  Filled 2019-09-21: qty 2

## 2019-09-21 MED ORDER — SUGAMMADEX SODIUM 200 MG/2ML IV SOLN
INTRAVENOUS | Status: DC | PRN
  Administered 2019-09-21: 200 mg via INTRAVENOUS

## 2019-09-21 MED ORDER — OXYCODONE HCL 30 MG PO TABS: 30.0000 mg | ORAL_TABLET | Freq: Four times a day (QID) | ORAL | 0 refills | Status: DC | PRN

## 2019-09-21 MED ORDER — PHENYLEPHRINE 40 MCG/ML (10ML) SYRINGE FOR IV PUSH (FOR BLOOD PRESSURE SUPPORT)
PREFILLED_SYRINGE | INTRAVENOUS | Status: DC | PRN
  Administered 2019-09-21: 80 ug via INTRAVENOUS

## 2019-09-21 MED ORDER — SODIUM CHLORIDE 0.9 % IV SOLN: INTRAVENOUS | Status: DC

## 2019-09-21 MED ORDER — SUCCINYLCHOLINE CHLORIDE 200 MG/10ML IV SOSY
PREFILLED_SYRINGE | INTRAVENOUS | Status: DC | PRN
  Administered 2019-09-21: 100 mg via INTRAVENOUS

## 2019-09-21 MED ORDER — MIDAZOLAM HCL 5 MG/5ML IJ SOLN
INTRAMUSCULAR | Status: DC | PRN
  Administered 2019-09-21: 2 mg via INTRAVENOUS

## 2019-09-21 MED ORDER — ONDANSETRON HCL 4 MG/2ML IJ SOLN
INTRAMUSCULAR | Status: DC | PRN
  Administered 2019-09-21: 4 mg via INTRAVENOUS

## 2019-09-21 MED ORDER — GLUCAGON HCL RDNA (DIAGNOSTIC) 1 MG IJ SOLR
INTRAMUSCULAR | Status: AC
  Filled 2019-09-21: qty 1

## 2019-09-21 MED ORDER — PROPOFOL 10 MG/ML IV BOLUS
INTRAVENOUS | Status: DC | PRN
  Administered 2019-09-21: 150 mg via INTRAVENOUS

## 2019-09-21 MED ORDER — FENTANYL CITRATE (PF) 100 MCG/2ML IJ SOLN
INTRAMUSCULAR | Status: AC
  Filled 2019-09-21: qty 2

## 2019-09-21 MED ORDER — CIPROFLOXACIN IN D5W 400 MG/200ML IV SOLN
INTRAVENOUS | Status: AC
  Filled 2019-09-21: qty 200

## 2019-09-21 MED ORDER — IOPAMIDOL (ISOVUE-300) INJECTION 61%
INTRAVENOUS | Status: DC | PRN
  Administered 2019-09-21: 15 mL

## 2019-09-21 MED ORDER — FENTANYL CITRATE (PF) 250 MCG/5ML IJ SOLN
INTRAMUSCULAR | Status: DC | PRN
  Administered 2019-09-21 (×2): 50 ug via INTRAVENOUS

## 2019-09-21 SURGICAL SUPPLY — 15 items

## 2019-09-21 NOTE — Telephone Encounter (Signed)
I called and spoke with Pharmacy. He does not get his medications through [facility] in regards to his chronic pain medications. As such, he is being flagged for too significant a dose as well as too early dose being able to be given. I was able to talk with patient. He has some abdominal discomfort but is able to drink fluids and has eaten a small amount. He is going to get Gatorade to stay hydrated. If things progress where he cannot eat/drink he needs to come to ED for evaluation to ensure he does not have pancreatitis or other complications from procedure. I sent a new Rx to the patient's pharmacy in [place] where he gets his chronic pain medications. I will have my RN staff reach out to the patient's Pain clinic to update them that we have sent a prescription for an acute dosing of medication so that they are aware of the medication and so that they do not stop giving him medications since this was an acute prescription. (Dr. Fredric Mare). If patient were to need prior authorization, then I will not be able to complete this until at least 2/25. In interim, he needs to take his medications as he has at home and if he cannot tolerate things then he will need to come to the ED for further evaluation/management.  Justice Britain, MD [facility] Advanced [facility]oscopy Office # [phone]

## 2019-09-21 NOTE — Anesthesia Preprocedure Evaluation (Signed)
Anesthesia Evaluation  Patient identified by MRN, date of birth, ID band Patient awake    Reviewed: Allergy & Precautions, NPO status , Patient's Chart, lab work & pertinent test results, reviewed documented beta blocker date and time   Airway Mallampati: III  TM Distance: >3 FB Neck ROM: Full    Dental no notable dental hx. (+) Teeth Intact, Dental Advisory Given   Pulmonary sleep apnea , COPD, Current Smoker and Patient abstained from smoking.,    Pulmonary exam normal breath sounds clear to auscultation       Cardiovascular hypertension, Pt. on home beta blockers and Pt. on medications + CAD and +CHF  Normal cardiovascular exam Rhythm:Regular Rate:Normal  TTE 01/2019  1. The left ventricle has mild-moderately reduced systolic function, with an ejection fraction of 40-45%. The cavity size was normal. Left ventricular diastolic Doppler parameters are consistent with impaired relaxation.  2. The right ventricle has normal systolic function. The cavity was mildly enlarged. There is no increase in right ventricular wall thickness.  3. Left atrial size was severely dilated.  4. Right atrial size was severely dilated.  5. Mild thickening of the mitral valve leaflet.  6. The aortic valve is tricuspid. Mild thickening of the aortic valve. Mild calcification of the aortic valve.  7. The aorta is normal in size and structure.  Stress Test 2016 1. No reversible ischemia or infarction. The inferior wall abnormality is most likely due to diaphragmatic attenuation artifact. 2.  Severe left ventricular diffuse hypokinesis. 3. Left ventricular ejection fraction 24% 4. High-risk stress test findings, due to severely reduced LV systolic function. The findings are more consistent with nonischemic cardiomyopathy, but cannot fully exclude "balanced" ischemia*.   Neuro/Psych PSYCHIATRIC DISORDERS Depression negative neurological ROS     GI/Hepatic PUD, GERD  Medicated and Controlled,(+) Hepatitis -, C  Endo/Other  negative endocrine ROS  Renal/GU Renal InsufficiencyRenal disease  negative genitourinary   Musculoskeletal  (+) Arthritis ,   Abdominal   Peds  Hematology negative hematology ROS (+)   Anesthesia Other Findings   Reproductive/Obstetrics                             Anesthesia Physical  Anesthesia Plan  ASA: III  Anesthesia Plan: General   Post-op Pain Management:    Induction: Intravenous  PONV Risk Score and Plan: 1 and Dexamethasone, Ondansetron and Treatment may vary due to age or medical condition  Airway Management Planned: Oral ETT  Additional Equipment: None  Intra-op Plan:   Post-operative Plan: Extubation in OR  Informed Consent: I have reviewed the patients History and Physical, chart, labs and discussed the procedure including the risks, benefits and alternatives for the proposed anesthesia with the patient or authorized representative who has indicated his/her understanding and acceptance.     Dental advisory given  Plan Discussed with: CRNA  Anesthesia Plan Comments:         Anesthesia Quick Evaluation

## 2019-09-21 NOTE — Telephone Encounter (Signed)
Medication is oxycodone. Please advise.

## 2019-09-21 NOTE — Discharge Instructions (Signed)
YOU HAD AN ENDOSCOPIC PROCEDURE TODAY: Refer to the procedure report and other information in the discharge instructions given to you for any specific questions about what was found during the examination. If this information does not answer your questions, please call [place] office at [phone] to clarify.  ° °YOU SHOULD EXPECT: Some feelings of bloating in the abdomen. Passage of more gas than usual. Walking can help get rid of the air that was put into your GI tract during the procedure and reduce the bloating. If you had a lower endoscopy (such as a colonoscopy or flexible sigmoidoscopy) you may notice spotting of blood in your stool or on the toilet paper. Some abdominal soreness may be present for a day or two, also. ° °DIET: Your first meal following the procedure should be a light meal and then it is ok to progress to your normal diet. A half-sandwich or bowl of soup is an example of a good first meal. Heavy or fried foods are harder to digest and may make you feel nauseous or bloated. Drink plenty of fluids but you should avoid alcoholic beverages for 24 hours. If you had a esophageal dilation, please see attached instructions for diet.   ° °ACTIVITY: Your care partner should take you home directly after the procedure. You should plan to take it easy, moving slowly for the rest of the day. You can resume normal activity the day after the procedure however YOU SHOULD NOT DRIVE, use power tools, machinery or perform tasks that involve climbing or major physical exertion for 24 hours (because of the sedation medicines used during the test).  ° °SYMPTOMS TO REPORT IMMEDIATELY: °A gastroenterologist can be reached at any hour. Please call [phone]  for any of the following symptoms:  °Following lower endoscopy (colonoscopy, flexible sigmoidoscopy) °Excessive amounts of blood in the stool  °Significant tenderness, worsening of abdominal pains  °Swelling of the abdomen that is new, acute  °Fever of 100° or  higher  °Following upper endoscopy (EGD, EUS, ERCP, esophageal dilation) °Vomiting of blood or coffee ground material  °New, significant abdominal pain  °New, significant chest pain or pain under the shoulder blades  °Painful or persistently difficult swallowing  °New shortness of breath  °Black, tarry-looking or red, bloody stools ° °FOLLOW UP:  °If any biopsies were taken you will be contacted by phone or by letter within the next 1-3 weeks. Call [phone]  if you have not heard about the biopsies in 3 weeks.  °Please also call with any specific questions about appointments or follow up tests. ° °

## 2019-09-21 NOTE — Anesthesia Procedure Notes (Signed)
Procedure Name: Intubation Date/Time: 09/21/2019 8:50 AM Performed by: Talbot Grumbling, CRNA Pre-anesthesia Checklist: Patient identified, Emergency Drugs available, Suction available and Patient being monitored Patient Re-evaluated:Patient Re-evaluated prior to induction Oxygen Delivery Method: Circle system utilized Preoxygenation: Pre-oxygenation with 100% oxygen Induction Type: IV induction Ventilation: Mask ventilation without difficulty Laryngoscope Size: Mac and 3 Grade View: Grade II Tube type: Oral Tube size: 8.0 mm Number of attempts: 1 Airway Equipment and Method: Stylet Placement Confirmation: ETT inserted through vocal cords under direct vision,  positive ETCO2 and breath sounds checked- equal and bilateral Secured at: 23 cm Tube secured with: Tape Dental Injury: Teeth and Oropharynx as per pre-operative assessment

## 2019-09-21 NOTE — H&P (Addendum)
GASTROENTEROLOGY PROCEDURE H&P NOTE   Primary Care Physician: Antonietta Jewel, MD  HPI: Jerome Adams is a 59 y.o. male who presents for ERCP s/p Ampullectomy for follow up and additional treatment if needed.  Past Medical History:  Diagnosis Date  . Alcohol abuse   . Arthritis    "joints" (03/30/2013)  . CHF (congestive heart failure) ([place])   . Chronic lower back pain   . Chronic pancreatitis ([place])   . CKD (chronic kidney disease), stage II   . COPD (chronic obstructive pulmonary disease) ([place]) 07/31/2014  . Depression   . Drug abuse ([place])    a. Remote drug abuse (cocaine). UDS 07/2014 neg for cocaine.  . Essential hypertension   . GERD (gastroesophageal reflux disease)    takes  otc  . Hepatitis C 2010   a. never treated - undectable virus 12/2013.  [name] Hyperlipidemia   . Hypertriglyceridemia   . NICM (nonischemic cardiomyopathy) ([place])    a. 12/2007 Echo: EF 45-50%;  b. 07/2014 Echo: EF 20-25%, diff HK, mod dil LA, sev dil RA, mild TR, PASP 31 mmHg.  . PUD (peptic ulcer disease)   . Sleep apnea    was tested [facility] at [facility].Marland KitchenMarland KitchenMarland KitchenNever heard anymore about it (03/30/2013)  . Spinal cord stimulator status    has had for 1 yr--inserted by [facility]  . Tobacco abuse   . Transaminitis    Past Surgical History:  Procedure Laterality Date  . ANTERIOR CERVICAL DECOMP/DISCECTOMY FUSION  04/05/2012   Procedure: ANTERIOR CERVICAL DECOMPRESSION/DISCECTOMY FUSION 1 LEVEL;  Surgeon: Elaina Hoops, MD;  Location: [place] [facility]UR[facility] ORS;  Service: Neurosurgery;  Laterality: N/A;  Cervical five-six anterior cervical decompression and fusion  . BILIARY STENT PLACEMENT  06/20/2019   Procedure: BILIARY STENT PLACEMENT;  Surgeon: Irving Copas., MD;  Location: [place];  Service: Gastroenterology;;  . BIOPSY  03/31/2019   Procedure: BIOPSY;  Surgeon: Milus Banister, MD;  Location: [facility];  Service: Endoscopy;;  . COLONOSCOPY WITH PROPOFOL N/A 08/28/2016   Procedure: COLONOSCOPY WITH PROPOFOL;   Surgeon: Milus Banister, MD;  [facility];  Service: Endoscopy;  Laterality: N/A;  . ENDOSCOPIC MUCOSAL RESECTION  06/20/2019   Procedure: ENDOSCOPIC MUCOSAL RESECTION;  Surgeon: Rush Landmark Telford Nab., MD;  Location: [place];  Service: Gastroenterology;;  . ERCP N/A 06/20/2019   Procedure: ENDOSCOPIC RETROGRADE CHOLANGIOPANCREATOGRAPHY (ERCP);  Surgeon: Irving Copas., MD;  Location: [place];  Service: Gastroenterology;  Laterality: N/A;  . ESOPHAGOGASTRODUODENOSCOPY (EGD) WITH PROPOFOL N/A 08/28/2016   Procedure: ESOPHAGOGASTRODUODENOSCOPY (EGD) WITH PROPOFOL;  Surgeon: Milus Banister, MD;  [facility];  Service: Endoscopy;  Laterality: N/A;  . ESOPHAGOGASTRODUODENOSCOPY (EGD) WITH PROPOFOL N/A 03/31/2019   Procedure: ESOPHAGOGASTRODUODENOSCOPY (EGD) WITH PROPOFOL;  Surgeon: Milus Banister, MD;  [facility];  Service: Endoscopy;  Laterality: N/A;  . ESOPHAGOGASTRODUODENOSCOPY (EGD) WITH PROPOFOL N/A 06/20/2019   Procedure: ESOPHAGOGASTRODUODENOSCOPY (EGD) WITH PROPOFOL;  Surgeon: Rush Landmark Telford Nab., MD;  Location: [place];  Service: Gastroenterology;  Laterality: N/A;  . EUS N/A 03/31/2019   Procedure: UPPER ENDOSCOPIC ULTRASOUND (EUS) RADIAL;  Surgeon: Milus Banister, MD;  [facility];  Service: Endoscopy;  Laterality: N/A;  . FOOT SURGERY Right 2012?   "took piece off that was hurting me" (03/30/2013)  . HARDWARE REMOVAL     "took screw out of my back; it had damaged my nerve" (03/30/2013)--lumber  . HEMOSTASIS CLIP PLACEMENT  06/20/2019   Procedure: HEMOSTASIS CLIP PLACEMENT;  Surgeon: Irving Copas., MD;  [facility];  Service: Gastroenterology;;  . INCISION AND DRAINAGE ABSCESS Left    "lower jaw, tooth abscess" (03/30/2013)  . LEFT HEART CATHETERIZATION WITH CORONARY ANGIOGRAM N/A 08/03/2014   Procedure: LEFT HEART CATHETERIZATION WITH CORONARY ANGIOGRAM;  Surgeon: Blane Ohara, MD;  Location: [facility]  CATH LAB;  Service: Cardiovascular;  Laterality: N/A;  . PANCREATIC STENT PLACEMENT  06/20/2019   Procedure: PANCREATIC STENT PLACEMENT;  Surgeon: Irving Copas., MD;  Location: [place];  Service: Gastroenterology;;  . POLYPECTOMY  06/20/2019   Procedure: POLYPECTOMY;  Surgeon: Irving Copas., MD;  Location: [place];  Service: Gastroenterology;;  . POSTERIOR LUMBAR FUSION     "L4-5" (03/31/2103)  . SPHINCTEROTOMY  06/20/2019   Procedure: SPHINCTEROTOMY;  Surgeon: Mansouraty, Telford Nab., MD;  Location: [place];  Service: Gastroenterology;;  . SPINAL CORD STIMULATOR IMPLANT  11/14/2012  . SUBMUCOSAL LIFTING INJECTION  06/20/2019   Procedure: SUBMUCOSAL LIFTING INJECTION;  Surgeon: Rush Landmark Telford Nab., MD;  Location: [place];  Service: Gastroenterology;;  . UPPER ESOPHAGEAL ENDOSCOPIC ULTRASOUND (EUS) N/A 06/20/2019   Procedure: UPPER ESOPHAGEAL ENDOSCOPIC ULTRASOUND (EUS);  Surgeon: Irving Copas., MD;  Location: [place];  Service: Gastroenterology;  Laterality: N/A;  . WRIST SURGERY Right 1980's   repair of tendons and nerves   Current Facility-Administered Medications  Medication Dose Route Frequency Provider Last Rate Last Admin  . 0.9 %  sodium chloride infusion   Intravenous Continuous Mansouraty, Telford Nab., MD      . lactated ringers infusion   Intravenous Continuous Mansouraty, Telford Nab., MD      . lactated ringers infusion   Intravenous Continuous Mansouraty, Telford Nab., MD       Allergies  Allergen Reactions  . Hydrocodone-Acetaminophen Itching   Family History  Problem Relation Age of Onset  . Heart disease Father        H/o MI followed by SCD @ 83.  [name] Heart disease Sister        Died suddenly @ 57 - h/o CHF s/p AICD.  [name] Breast cancer Sister   . Bone cancer Mother   . Esophageal cancer Neg Hx   . Stomach cancer Neg Hx   . Colon cancer Neg Hx   . Inflammatory bowel disease Neg Hx   . Liver disease Neg Hx   .  Pancreatic cancer Neg Hx   . Rectal cancer Neg Hx    Social History   Socioeconomic History  . Marital status: Divorced    Spouse name: Not on file  . Number of children: 1  . Years of education: Not on file  . Highest education level: Not on file  Occupational History  . Occupation: UNEMPLOYEED    Employer: UNEMPLOYED    Comment: on disability.   Tobacco Use  . Smoking status: Current Some Day Smoker    Packs/day: 0.75    Years: 40.00    Pack years: 30.00    Types: Cigarettes  . Smokeless tobacco: Never Used  Substance and Sexual Activity  . Alcohol use: Not Currently    Alcohol/week: 4.0 standard drinks    Types: 4 Cans of beer per week    Comment: Has been drinking since age 19.  Has averaged 4 22 oz cans of beer daily until late 2015.  . Drug use: No    Types: Cocaine    Comment: 03/30/2013 "[place]"  . Sexual activity: Not on file  Other Topics Concern  . Not on file  Social History Narrative  Lives in [place] by himself.  Does not routinely exercise.   Social Determinants of Health   Financial Resource Strain:   . Difficulty of Paying Living Expenses: Not on file  Food Insecurity:   . Worried About Charity fundraiser in the Last Year: Not on file  . Ran Out of Food in the Last Year: Not on file  Transportation Needs:   . Lack of Transportation (Medical): Not on file  . Lack of Transportation (Non-Medical): Not on file  Physical Activity:   . Days of Exercise per Week: Not on file  . Minutes of Exercise per Session: Not on file  Stress:   . Feeling of Stress : Not on file  Social Connections:   . Frequency of Communication with Friends and Family: Not on file  . Frequency of Social Gatherings with Friends and Family: Not on file  . Attends Religious Services: Not on file  . Active Member of Clubs or Organizations: Not on file  . Attends Archivist Meetings: Not on file  . Marital Status: Not on file  Intimate Partner Violence:   . Fear of  Current or Ex-Partner: Not on file  . Emotionally Abused: Not on file  . Physically Abused: Not on file  . Sexually Abused: Not on file    Physical Exam: Vital signs in last 24 hours:     GEN: NAD EYE: Sclerae anicteric ENT: MMM CV: Non-tachycardic GI: Soft, NT/ND NEURO:  Alert & Oriented x 3  Lab Results: No results for input(s): WBC, HGB, HCT, PLT in the last 72 hours. BMET No results for input(s): NA, K, CL, CO2, GLUCOSE, BUN, CREATININE, CALCIUM in the last 72 hours. LFT No results for input(s): PROT, ALBUMIN, AST, ALT, ALKPHOS, BILITOT, BILIDIR, IBILI in the last 72 hours. PT/INR No results for input(s): LABPROT, INR in the last 72 hours.   Impression / Plan: This is a 58 y.o.male who presents for ERCP s/p Ampullectomy for follow up and additional treatment if needed.  The risks of an ERCP were discussed at length, including but not limited to the risk of perforation, bleeding, abdominal pain, post-ERCP pancreatitis (while usually mild can be severe and even life threatening).  The risks and benefits of endoscopic evaluation were discussed with the patient; these include but are not limited to the risk of perforation, infection, bleeding, missed lesions, lack of diagnosis, severe illness requiring hospitalization, as well as anesthesia and sedation related illnesses.  The patient is agreeable to proceed.    Justice Britain, MD [facility] # [phone]

## 2019-09-21 NOTE — Transfer of Care (Signed)
Immediate Anesthesia Transfer of Care Note  Patient: NICHOLSON HORNE  Procedure(s) Performed: ENDOSCOPIC RETROGRADE CHOLANGIOPANCREATOGRAPHY (ERCP) WITH PROPOFOL (N/A ) ESOPHAGOGASTRODUODENOSCOPY (EGD) WITH PROPOFOL (N/A ) BIOPSY STENT REMOVAL BILIARY STENT PLACEMENT (N/A ) PANCREATIC STENT PLACEMENT REMOVAL OF STONES  Patient Location: PACU  Anesthesia Type:General  Level of Consciousness: sedated  Airway & Oxygen Therapy: Patient Spontanous Breathing and Patient connected to face mask oxygen  Post-op Assessment: Report given to RN and Post -op Vital signs reviewed and stable  Post vital signs: Reviewed and stable  Last Vitals:  Vitals Value Taken Time  BP    Temp    Pulse 73 09/21/19 1026  Resp 16 09/21/19 1026  SpO2 100 % 09/21/19 1026  Vitals shown include unvalidated device data.  Last Pain:  Vitals:   09/21/19 0745  TempSrc: Oral  PainSc: 6          Complications: No apparent anesthesia complications

## 2019-09-21 NOTE — Op Note (Signed)
[facility] Patient Name: Jerome Adams Procedure Date: 09/21/2019 MRN: [id number] Attending MD: Justice Britain , MD Date of Birth: 11-01-1960 CSN: [id number] Age: 59 Admit Type: Outpatient Procedure:                ERCP Indications:              Stent removal, Follow-up of periampullary tumor,                            For therapy of periampullary tumor Providers:                Justice Britain, MD, Burtis Junes, RN, Elspeth Cho Tech., Technician, Marla Roe, CRNA Referring MD:             Milus Banister, MD Medicines:                General Anesthesia, Cipro 400 mg IV, Indomethacin                            100 mg PR, Glucagon 2.50 mg IV Complications:            No immediate complications. Estimated Blood Loss:     Estimated blood loss was minimal. Procedure:                Pre-Anesthesia Assessment:                           - Prior to the procedure, a History and Physical                            was performed, and patient medications and                            allergies were reviewed. The patient's tolerance of                            previous anesthesia was also reviewed. The risks                            and benefits of the procedure and the sedation                            options and risks were discussed with the patient.                            All questions were answered, and informed consent                            was obtained. Prior Anticoagulants: The patient has                            taken no previous anticoagulant or antiplatelet  agents. ASA Grade Assessment: III - A patient with                            severe systemic disease. After reviewing the risks                            and benefits, the patient was deemed in                            satisfactory condition to undergo the procedure.                           After obtaining informed consent,  the scope was                            passed under direct vision. Throughout the                            procedure, the patient's blood pressure, pulse, and                            oxygen saturations were monitored continuously. The                            GIF-H190 ([id number]) Olympus gastroscope was                            introduced through the mouth, and used to inject                            contrast into and used to locate the major papilla.                            The TJF-Q190V ([id number]) Olympus duodenoscope was                            introduced through the and used to inject contrast                            into the bile duct and ventral duct. The ERCP was                            accomplished without difficulty. The patient                            tolerated the procedure. Scope In: Scope Out: Findings:      A biliary stent was visible on the scout film.      The esophagus was successfully intubated under direct vision without       detailed examination of the pharynx, larynx, and associated structures.       Inspection of the major papilla revealed that an ampullectomy       (papillectomy) had been performed previously with concern for potential       residual/recurrent adenoma.  Sludge was emerging from the major papilla.       Inspection of the major papilla revealed that biliary and pancreatic       sphincterotomies had been performed previously. The biliary       sphincterotomy appeared open. The pancreatic sphincterotomy appeared       open. One plastic biliary stent originating in the biliary tree was       emerging from the major papilla. The stent was visibly patent. One stent       was removed from the biliary tree using a snare.      A short 0.035 inch Soft Jagwire was passed into the biliary tree. The       Autotome sphincterotome was passed over the guidewire and the bile duct       was then deeply cannulated. Contrast was injected. I  personally       interpreted the bile duct images. Ductal flow of contrast was adequate.       Image quality was adequate. Contrast extended to the hepatic ducts.       Opacification of the entire biliary tree except for the cystic duct and       gallbladder was successful. The maximum diameter of the ducts was 10 mm.       The lower third of the main duct contained filling defects thought to be       sludge. To discover objects, the biliary tree was swept with a retrieval       balloon starting at the bifurcation. Sludge was swept from the duct.      A short 0.035 inch Soft Jagwire was passed into the ventral pancreatic       duct. The ventral pancreatic duct was then deeply cannulated with the       Autotome sphincterotome. Contrast was injected. Opacification of the       entire pancreatic ductal system was successful. The maximum diameter of       the ducts was 2 mm.      No evidence of strictures were noted in the biliary or pancreatic ducts.      Inspection of the major papilla revealed that there was concern that       residual/recurrent adenoma may be present. Avulsion for destruction of       remaining portion of lesion in the ampulla using hot biopsy forceps       through the ERCP scope was successful around the lesion to remove what       looked like residual/recurrent adenomatous tissue. it was sent for       pathology evaluation.      A short 0.035 inch Soft Jagwire was passed into the ventral pancreatic       duct. One 4 Fr by 7 cm plastic pancreatic stent with a full external       pigtail was placed into the ventral pancreatic duct. The stent was in       good position.      A short 0.035 inch Soft Jagwire was passed into the biliary tree. One 10       Fr by 5 cm plastic biliary stent with a single external flap and a       single internal flap was placed into the common bile duct. Bile flowed       through the stent. The stent was in good position.      The duodenoscope  was withdrawn from  the patient.      The endoscope was replaced and there was no evidence of mucosal       abnormalities noted. The Endoscope was removed. Impression:               - Prior endoscopic papillectomy with concern for                            recurrent/residual adenomatous tissue.                           - One visibly patent stent from the biliary tree                            was seen in the major papilla. This was removed                            from the biliary tree and sent for cytology.                           - Prior biliary sphincterotomy appeared open.                           - Prior pancreatic sphincterotomy appeared open.                           - Hot biopsy Avulsion for destruction of remaining                            portion of lesion in the ampulla with hot biopsy                            forceps was performed.                           - The fluroscopic examination was suspicious for                            sludge.                           - The biliary tree was swept and sludge was found.                           - One plastic pancreatic stent was placed into the                            ventral pancreatic duct.                           - One plastic biliary stent was placed into the                            common bile duct. Moderate Sedation:      Not Applicable - Patient had care per Anesthesia. Recommendation:           -  The patient will be observed post-procedure,                            until all discharge criteria are met.                           - Discharge patient to home.                           - Patient has a contact number available for                            emergencies. The signs and symptoms of potential                            delayed complications were discussed with the                            patient. Return to normal activities tomorrow.                            Written discharge  instructions were provided to the                            patient.                           - Watch for pancreatitis, bleeding, perforation,                            and cholangitis.                           - PPI twice daily for now should be continued.                           - Await cytology results and await path results.                           - Repeat ERCP in 4 months to remove stent and                            evaluate if any recurrent adenomatous tissue is                            present.                           - Patient will need a KUB 2-view in 10-14 days to                            ensure pancreatic stent has migrated successfully.                            If still present at that time will need  to be                            scheduled for EGD with stent pull.                           - The findings and recommendations were discussed                            with the patient.                           - The findings and recommendations were discussed                            with the designated responsible adult. Procedure Code(s):        --- Professional ---                           [phone], Endoscopic retrograde                            cholangiopancreatography (ERCP); with removal and                            exchange of stent(s), biliary or pancreatic duct,                            including pre- and post-dilation and guide wire                            passage, when performed, including sphincterotomy,                            when performed, each stent exchanged                           43278, Endoscopic retrograde                            cholangiopancreatography (ERCP); with ablation of                            tumor(s), polyp(s), or other lesion(s), including                            pre- and post-dilation and guide wire passage, when                            performed                           43264, Endoscopic  retrograde                            cholangiopancreatography (ERCP); with removal of  calculi/debris from biliary/pancreatic duct(s) Diagnosis Code(s):        --- Professional ---                           Z96.89, Presence of other specified functional                            implants                           Z46.59, Encounter for fitting and adjustment of                            other gastrointestinal appliance and device                           D49.0, Neoplasm of unspecified behavior of                            digestive system                           K83.8, Other specified diseases of biliary tract CPT copyright 2019 American Medical Association. All rights reserved. The codes documented in this report are preliminary and upon coder review may  be revised to meet current compliance requirements. Justice Britain, MD 09/21/2019 10:56:26 AM Number of Addenda: 0

## 2019-09-21 NOTE — Anesthesia Postprocedure Evaluation (Signed)
Anesthesia Post Note  Patient: Jerome Adams  Procedure(s) Performed: ENDOSCOPIC RETROGRADE CHOLANGIOPANCREATOGRAPHY (ERCP) WITH PROPOFOL (N/A ) ESOPHAGOGASTRODUODENOSCOPY (EGD) WITH PROPOFOL (N/A ) BIOPSY STENT REMOVAL BILIARY STENT PLACEMENT (N/A ) PANCREATIC STENT PLACEMENT REMOVAL OF STONES     Patient location during evaluation: PACU Anesthesia Type: General Level of consciousness: awake and alert Pain management: pain level controlled Vital Signs Assessment: post-procedure vital signs reviewed and stable Respiratory status: spontaneous breathing, nonlabored ventilation, respiratory function stable and patient connected to nasal cannula oxygen Cardiovascular status: blood pressure returned to baseline and stable Postop Assessment: no apparent nausea or vomiting Anesthetic complications: no    Last Vitals:  Vitals:   09/21/19 1100 09/21/19 1110  BP: 127/78 138/84  Pulse: 64 77  Resp: 13 13  Temp:    SpO2: 100% 99%    Last Pain:  Vitals:   09/21/19 1120  TempSrc:   PainSc: (P) 0-No pain                 Tiajuana Amass

## 2019-09-21 NOTE — Telephone Encounter (Signed)
Tried returning call to pt to see what medication is needed. Left message asking for return call.

## 2019-09-22 ENCOUNTER — Encounter: Payer: Self-pay | Admitting: *Deleted

## 2019-09-22 ENCOUNTER — Telehealth: Payer: Self-pay | Admitting: Gastroenterology

## 2019-09-22 LAB — SURGICAL PATHOLOGY

## 2019-09-22 LAB — CYTOLOGY - NON PAP

## 2019-09-22 NOTE — Telephone Encounter (Signed)
I have faxed a note to the PCP regarding pain medication prescribed post ERCP.

## 2019-09-22 NOTE — Telephone Encounter (Signed)
The pt had ERCP yesterday and felt fine until last night and this morning.  He says he feels like he is really weak and can not stand up or get out of bed with out help.  No pain just very weak and "legs won't work"  Please advise

## 2019-09-23 NOTE — Telephone Encounter (Signed)
Called and spoke with patient. He has been experiencing improvement in his abdominal discomfort. Never received the extra dosing of Oxycodone that had been sent. He is eating and drinking. Issue remains that since yesterday he has been having significant myalgias and weakness of all his muscles. He can find that after he moves for a bit, which is quite painful and difficult that he can begin to work and use his muscles but it is uncomfortable. Patient did receive Succinylcholine for intubation but not sure if further paralytics were used during the procedure. I would have expected that if this was medication effects it should be improved by now. His prior ERCP he did not experieince these issues. I have asked that patient get checked out by PCP or UC or ED.   He may benefit from electrolyte evaluation and other neurological workup. He is going to consider this today based on how the morning does. 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 He was appreciative for the care of Dr. Ardis Hughs and the [facility] group.  Patty, please call and check on patient on Monday to see how things have gone. Dr. Ola Spurr - FYI, not clear that his symptoms this far out would be from our recent ERCP but wanted to loop you in. Dr. Ardis Hughs - FYI about the results of the pathology.  Justice Britain, MD [facility] # [phone]

## 2019-09-23 NOTE — Telephone Encounter (Signed)
Will call pt on Monday to get a condition update.

## 2019-09-24 ENCOUNTER — Encounter: Payer: Self-pay | Admitting: Gastroenterology

## 2019-10-30 DIAGNOSIS — Z7189 Other specified counseling: Secondary | ICD-10-CM | POA: Insufficient documentation

## 2019-10-30 NOTE — Progress Notes (Deleted)
Cardiology Office Note   Date:  10/30/2019   ID:  Jerome Adams, DOB 1961/04/09, MRN [id number]  PCP:  Antonietta Jewel, MD  Cardiologist:   No primary care provider on file. Referring:  ***  No chief complaint on file.     History of Present Illness: Jerome Adams is a 59 y.o. male who presents for  assessment and management of dilated cardiomyopathy, postulated to be related to alcohol abuse, EF 20% to 25% in 2016, improvement to 35% to 40% in 2018.  Repeat echo in 2020 with an EF of 40 - 45%.    ***  ***  F***Was last seen by Dr. Percival Spanish on 02/11/2019.  He was complaining of lightheadedness and abdominal bloating.  He was not found to be orthostatic.  He was not taking his medications correctly and therefore he was reeducated on dosing.  Blood pressure was high normal for his current ejection fraction.  A repeat echocardiogram was ordered along with labs for evaluation of kidney function, and also included a BNP.  Repeat echocardiogram on 02/16/2019 revealed an EF of 40% to 45% with normal cavity size, he did have evidence of diastolic dysfunction.  The LA size was severely dilated, the RA size was severely dilated.  He was noted to have mildly elevated BNP and was advised to take extra dose of torsemide daily for 4 days.  Jerome Adams underwent an upper endoscopic ultrasound on 03/31/2019 with biopsies.  He is followed by GI.  There was no evidence of cancer.  He does have a precancerous polyp growing in his major papilla in the duodenum which was causing incomplete biliary obstruction and elevated liver tests.  Further treatment with an ampullectomy is being considered.  Follow-up on May 12, 2019.  Jerome Adams is without any cardiac complaints today.  He has gained about 3 pounds over the weekend and admits to eating some [place] food.  He has not yet taken his torsemide.  Otherwise his breathing status has been good, he denies any lower extremity edema.  He is compliant with his  medications.  He is planning on quitting smoking.  A person that was living with him at one time was a smoker and has left.  He feels now is a good time to quit smoking as it will be easier without someone else in his home who smokes.  He is also being followed by [facility] and has had recent labs completed.   Past Medical History:  Diagnosis Date  . Alcohol abuse   . Arthritis    "joints" (03/30/2013)  . CHF (congestive heart failure) ([place])   . Chronic lower back pain   . Chronic pancreatitis ([place])   . CKD (chronic kidney disease), stage II   . COPD (chronic obstructive pulmonary disease) ([place]) 07/31/2014  . Depression   . Drug abuse ([place])    a. Remote drug abuse (cocaine). UDS 07/2014 neg for cocaine.  . Essential hypertension   . GERD (gastroesophageal reflux disease)    takes  otc  . Hepatitis C 2010   a. never treated - undectable virus 12/2013.  [name] Hyperlipidemia   . Hypertriglyceridemia   . NICM (nonischemic cardiomyopathy) ([place])    a. 12/2007 Echo: EF 45-50%;  b. 07/2014 Echo: EF 20-25%, diff HK, mod dil LA, sev dil RA, mild TR, PASP 31 mmHg.  . PUD (peptic ulcer disease)   . Sleep apnea    was tested here at [facility].Marland KitchenMarland KitchenMarland KitchenNever heard anymore about it (  03/30/2013)  . Spinal cord stimulator status    has had for 1 yr--inserted by [facility]  . Tobacco abuse   . Transaminitis     Past Surgical History:  Procedure Laterality Date  . ANTERIOR CERVICAL DECOMP/DISCECTOMY FUSION  04/05/2012   Procedure: ANTERIOR CERVICAL DECOMPRESSION/DISCECTOMY FUSION 1 LEVEL;  Surgeon: Elaina Hoops, MD;  Location: [place] [facility];  Service: Neurosurgery;  Laterality: N/A;  Cervical five-six anterior cervical decompression and fusion  . BILIARY STENT PLACEMENT  06/20/2019   Procedure: BILIARY STENT PLACEMENT;  Surgeon: Rush Landmark Telford Nab., MD;  Location: [place];  Service: Gastroenterology;;  . BILIARY STENT PLACEMENT N/A 09/21/2019   Procedure: BILIARY STENT PLACEMENT;  Surgeon: Irving Copas., MD;  Location: [facility];  Service: Gastroenterology;  Laterality: N/A;  . BIOPSY  03/31/2019   Procedure: BIOPSY;  Surgeon: Milus Banister, MD;  Location: [facility];  Service: Endoscopy;;  . BIOPSY  09/21/2019   Procedure: BIOPSY;  Surgeon: Irving Copas., MD;  Location: [facility];  Service: Gastroenterology;;  . COLONOSCOPY WITH PROPOFOL N/A 08/28/2016   Procedure: COLONOSCOPY WITH PROPOFOL;  Surgeon: Milus Banister, MD;  Location: [facility];  Service: Endoscopy;  Laterality: N/A;  . ENDOSCOPIC MUCOSAL RESECTION  06/20/2019   Procedure: ENDOSCOPIC MUCOSAL RESECTION;  Surgeon: Rush Landmark, Telford Nab., MD;  Location: [facility] END[facility]COPY;  Service: Gastroenterology;;  . ENDOSCOPIC RETROGRADE CHOLANGIOPANCREATOGRAPHY (ERCP) WITH PROPOFOL N/A 09/21/2019   Procedure: ENDOSCOPIC RETROGRADE CHOLANGIOPANCREATOGRAPHY (ERCP) WITH PROPOFOL;  Surgeon: Irving Copas., MD;  Location: [facility];  Service: Gastroenterology;  Laterality: N/A;  . ERCP N/A 06/20/2019   Procedure: ENDOSCOPIC RETROGRADE CHOLANGIOPANCREATOGRAPHY (ERCP);  Surgeon: Irving Copas., MD;  Location: [place];  Service: Gastroenterology;  Laterality: N/A;  . ESOPHAGOGASTRODUODENOSCOPY (EGD) WITH PROPOFOL N/A 08/28/2016   Procedure: ESOPHAGOGASTRODUODENOSCOPY (EGD) WITH PROPOFOL;  Surgeon: Milus Banister, MD;  [facility]: [facility];  Service: Endoscopy;  Laterality: N/A;  . ESOPHAGOGASTRODUODENOSCOPY (EGD) WITH PROPOFOL N/A 03/31/2019   Procedure: ESOPHAGOGASTRODUODENOSCOPY (EGD) WITH PROPOFOL;  Surgeon: Milus Banister, MD;  Location: [facility];  Service: Endoscopy;  Laterality: N/A;  . ESOPHAGOGASTRODUODENOSCOPY (EGD) WITH PROPOFOL N/A 06/20/2019   Procedure: ESOPHAGOGASTRODUODENOSCOPY (EGD) WITH PROPOFOL;  Surgeon: Rush Landmark Telford Nab., MD;  Location: [place];  Service: Gastroenterology;  Laterality: N/A;  . ESOPHAGOGASTRODUODENOSCOPY (EGD) WITH PROPOFOL N/A 09/21/2019   Procedure:  ESOPHAGOGASTRODUODENOSCOPY (EGD) WITH PROPOFOL;  Surgeon: Rush Landmark Telford Nab., MD;  Location: [facility];  Service: Gastroenterology;  Laterality: N/A;  . EUS N/A 03/31/2019   Procedure: UPPER ENDOSCOPIC ULTRASOUND (EUS) RADIAL;  Surgeon: Milus Banister, MD;  Location: [facility];  Service: Endoscopy;  Laterality: N/A;  . FOOT SURGERY Right 2012?   "took piece off that was hurting me" (03/30/2013)  . HARDWARE REMOVAL     "took screw out of my back; it had damaged my nerve" (03/30/2013)--lumber  . HEMOSTASIS CLIP PLACEMENT  06/20/2019   Procedure: HEMOSTASIS CLIP PLACEMENT;  Surgeon: Irving Copas., MD;  Location: [place];  Service: Gastroenterology;;  . INCISION AND DRAINAGE ABSCESS Left    "lower jaw, tooth abscess" (03/30/2013)  . LEFT HEART CATHETERIZATION WITH CORONARY ANGIOGRAM N/A 08/03/2014   Procedure: LEFT HEART CATHETERIZATION WITH CORONARY ANGIOGRAM;  Surgeon: Blane Ohara, MD;  Location: [facility] CATH LAB;  Service: Cardiovascular;  Laterality: N/A;  . PANCREATIC STENT PLACEMENT  06/20/2019   Procedure: PANCREATIC STENT PLACEMENT;  Surgeon: Rush Landmark Telford Nab., MD;  Location: [place];  Service: Gastroenterology;;  . PANCREATIC STENT PLACEMENT  09/21/2019   Procedure:  PANCREATIC STENT PLACEMENT;  Surgeon: Rush Landmark Telford Nab., MD;  Location: [facility];  Service: Gastroenterology;;  . POLYPECTOMY  06/20/2019   Procedure: POLYPECTOMY;  Surgeon: Irving Copas., MD;  Location: [place];  Service: Gastroenterology;;  . POSTERIOR LUMBAR FUSION     "L4-5" (03/31/2103)  . REMOVAL OF STONES  09/21/2019   Procedure: REMOVAL OF STONES;  Surgeon: Rush Landmark Telford Nab., MD;  Location: [facility];  Service: Gastroenterology;;  . Joan Mayans  06/20/2019   Procedure: Joan Mayans;  Surgeon: Irving Copas., MD;  Location: [place];  Service: Gastroenterology;;  . SPINAL CORD STIMULATOR IMPLANT  11/14/2012  . STENT REMOVAL  09/21/2019    Procedure: STENT REMOVAL;  Surgeon: Irving Copas., MD;  Location: [facility];  Service: Gastroenterology;;  . Lia Foyer LIFTING INJECTION  06/20/2019   Procedure: SUBMUCOSAL LIFTING INJECTION;  Surgeon: Irving Copas., MD;  Location: [place];  Service: Gastroenterology;;  . UPPER ESOPHAGEAL ENDOSCOPIC ULTRASOUND (EUS) N/A 06/20/2019   Procedure: UPPER ESOPHAGEAL ENDOSCOPIC ULTRASOUND (EUS);  Surgeon: Irving Copas., MD;  Location: [place];  Service: Gastroenterology;  Laterality: N/A;  . WRIST SURGERY Right 1980's   repair of tendons and nerves     Current Outpatient Medications  Medication Sig Dispense Refill  . aspirin EC 81 MG EC tablet Take 1 tablet (81 mg total) by mouth daily. 30 tablet 0  . Ensure Plus (ENSURE PLUS) LIQD Take 237 mLs by mouth 2 (two) times daily between meals.    [name] ENTRESTO 49-51 MG Take 1 tablet by mouth twice daily. (Patient taking differently: Take 1 tablet by mouth 2 (two) times daily. ) 60 tablet 4  . gemfibrozil (LOPID) 600 MG tablet Take 1 tablet (600 mg total) by mouth 2 (two) times daily before a meal. 60 tablet 5  . metoprolol succinate (TOPROL-XL) 100 MG 24 hr tablet Take 1 tablet by mouth daily. Take with or immediately following a meal. (Patient taking differently: Take 100 mg by mouth daily. ) 90 tablet 3  . Multiple Vitamin (MULTIVITAMIN WITH MINERALS) TABS tablet Take 1 tablet by mouth daily.    [name] omega-3 acid ethyl esters (LOVAZA) 1 G capsule Take 1 capsule (1 g total) by mouth 2 (two) times daily. 60 capsule 5  . omeprazole (PRILOSEC) 40 MG capsule Take 1 capsule (40 mg total) by mouth 2 (two) times daily before a meal. 60 capsule 4  . oxycodone (ROXICODONE) 30 MG immediate release tablet Take 30 mg by mouth 4 (four) times daily as needed for pain.   0  . rosuvastatin (CRESTOR) 40 MG tablet Take 40 mg by mouth daily.    . sertraline (ZOLOFT) 100 MG tablet Take 100 mg by mouth daily.     [name] spironolactone  (ALDACTONE) 25 MG tablet Take 1 tablet by mouth every other day. (Patient taking differently: Take 25 mg by mouth every other day. In the morning) 15 tablet 4  . torsemide (DEMADEX) 20 MG tablet Take 2 tablets by mouth daily . (Patient taking differently: Take 20 mg by mouth 2 (two) times daily. ) 60 tablet 3  . Vitamin D, Ergocalciferol, (DRISDOL) 1.25 MG (50000 UT) CAPS capsule Take 50,000 Units by mouth every Friday.     No current facility-administered medications for this visit.    Allergies:   Hydrocodone-acetaminophen    ROS:  Please see the history of present illness.   Otherwise, review of systems are positive for {NONE DEFAULTED:18576::"none"}.   All other systems are reviewed and negative.  PHYSICAL EXAM: VS:  There were no vitals taken for this visit. , BMI There is no height or weight on file to calculate BMI. GENERAL:  Well appearing NECK:  No jugular venous distention, waveform within normal limits, carotid upstroke brisk and symmetric, no bruits, no thyromegaly LUNGS:  Clear to auscultation bilaterally CHEST:  Unremarkable HEART:  PMI not displaced or sustained,S1 and S2 within normal limits, no S3, no S4, no clicks, no rubs, *** murmurs ABD:  Flat, positive bowel sounds normal in frequency in pitch, no bruits, no rebound, no guarding, no midline pulsatile mass, no hepatomegaly, no splenomegaly EXT:  2 plus pulses throughout, no edema, no cyanosis no clubbing    ***GENERAL:  Well appearing HEENT:  Pupils equal round and reactive, fundi not visualized, oral mucosa unremarkable NECK:  No jugular venous distention, waveform within normal limits, carotid upstroke brisk and symmetric, no bruits, no thyromegaly LYMPHATICS:  No cervical, inguinal adenopathy LUNGS:  Clear to auscultation bilaterally BACK:  No CVA tenderness CHEST:  Unremarkable HEART:  PMI not displaced or sustained,S1 and S2 within normal limits, no S3, no S4, no clicks, no rubs, *** murmurs ABD:  Flat,  positive bowel sounds normal in frequency in pitch, no bruits, no rebound, no guarding, no midline pulsatile mass, no hepatomegaly, no splenomegaly EXT:  2 plus pulses throughout, no edema, no cyanosis no clubbing SKIN:  No rashes no nodules NEURO:  Cranial nerves II through XII grossly intact, motor grossly intact throughout PSYCH:  Cognitively intact, oriented to person place and time    EKG:  EKG {ACTION; IS/IS GI:[id number] ordered today. The ekg ordered today demonstrates ***   Recent Labs: 02/11/2019: NT-Pro BNP 485 08/05/2019: ALT 12; BUN 16; Creatinine, Ser 1.13; Hemoglobin 13.6; Platelets 223.0; Potassium 3.7; Sodium 140    Lipid Panel    Component Value Date/Time   CHOL 329 (H) 03/29/2013 1238   TRIG 410 (H) 05/26/2013 1443   HDL 21 (L) 03/29/2013 1238   CHOLHDL 15.7 03/29/2013 1238   VLDL NOT CALC 03/29/2013 1238   LDLCALC NOT CALC 03/29/2013 1238   LDLDIRECT 89 12/09/2012 1225      Wt Readings from Last 3 Encounters:  09/21/19 220 lb (99.8 kg)  08/05/19 217 lb 6 oz (98.6 kg)  06/20/19 214 lb 3.6 oz (97.2 kg)      Other studies Reviewed: Additional studies/ records that were reviewed today include: ***. Review of the above records demonstrates:  Please see elsewhere in the note.  ***   ASSESSMENT AND PLAN:  Nonischemic cardiomyopathy:  ***  hought to be related to alcohol abuse.  He has stopped drinking for several years now.  EF has improved from initial reading of 20% to 25% up to 40 to 45% on most recent echo.  He continues on Entresto 49/51 mg daily metoprolol succinate 100 mg daily, and offers no complaints of rapid heart rhythm or dyspnea.  Hypertension:   ***   reports that his blood pressures been in the [id number] systolic range on most recent office appointments.  He does not have a blood pressure cuff to check his own blood pressure at home.  I will arrange for him to have one sent via our social worker.  In the interim the patient will continue  spironolactone, Entresto, metoprolol, as directed.  I will request labs which were recently drawn by [facility].  Hypercholesterolemia:   ***  patient continues on rosuvastatin 20 mg daily.  Follow-up lipids and LFTs will need  to be repeated after he has his procedure completed as described above.  Covid education:    ***  Current medicines are reviewed at length with the patient today.  The patient {ACTIONS; HAS/DOES NOT HAVE:19233} concerns regarding medicines.  The following changes have been made:  {PLAN; NO CHANGE:13088:s}  Labs/ tests ordered today include: *** No orders of the defined types were placed in this encounter.    Disposition:   FU with ***    Signed, Minus Breeding, MD  10/30/2019 11:39 AM    [facility]

## 2019-10-31 ENCOUNTER — Ambulatory Visit: Payer: Medicare Other | Admitting: Cardiology

## 2019-11-03 ENCOUNTER — Other Ambulatory Visit: Payer: Self-pay | Admitting: Cardiology

## 2019-11-07 NOTE — Progress Notes (Deleted)
Cardiology Office Note   Date:  11/07/2019   ID:  Jerome Adams, DOB 1960-10-21, MRN [id number]  PCP:  Antonietta Jewel, MD  Cardiologist:   No primary care provider on file. Referring:  ***  No chief complaint on file.     History of Present Illness: Jerome Adams is a 59 y.o. male who presents for  assessment and management of dilated cardiomyopathy, postulated to be related to alcohol abuse, EF 20% to 25% in 2016, improvement to 35% to 40% in 2018.  Repeat echo in 2020 with an EF of 40 - 45%.    ***  ***  F***Was last seen by Dr. Percival Spanish on 02/11/2019.  He was complaining of lightheadedness and abdominal bloating.  He was not found to be orthostatic.  He was not taking his medications correctly and therefore he was reeducated on dosing.  Blood pressure was high normal for his current ejection fraction.  A repeat echocardiogram was ordered along with labs for evaluation of kidney function, and also included a BNP.  Repeat echocardiogram on 02/16/2019 revealed an EF of 40% to 45% with normal cavity size, he did have evidence of diastolic dysfunction.  The LA size was severely dilated, the RA size was severely dilated.  He was noted to have mildly elevated BNP and was advised to take extra dose of torsemide daily for 4 days.  Jerome Adams underwent an upper endoscopic ultrasound on 03/31/2019 with biopsies.  He is followed by GI.  There was no evidence of cancer.  He does have a precancerous polyp growing in his major papilla in the duodenum which was causing incomplete biliary obstruction and elevated liver tests.  Further treatment with an ampullectomy is being considered.  Follow-up on May 12, 2019.  Jerome Adams is without any cardiac complaints today.  He has gained about 3 pounds over the weekend and admits to eating some [place] food.  He has not yet taken his torsemide.  Otherwise his breathing status has been good, he denies any lower extremity edema.  He is compliant with his  medications.  He is planning on quitting smoking.  A person that was living with him at one time was a smoker and has left.  He feels now is a good time to quit smoking as it will be easier without someone else in his home who smokes.  He is also being followed by [facility] and has had recent labs completed.   Past Medical History:  Diagnosis Date  . Alcohol abuse   . Arthritis    "joints" (03/30/2013)  . CHF (congestive heart failure) ([place])   . Chronic lower back pain   . Chronic pancreatitis ([place])   . CKD (chronic kidney disease), stage II   . COPD (chronic obstructive pulmonary disease) ([place]) 07/31/2014  . Depression   . Drug abuse ([place])    a. Remote drug abuse (cocaine). UDS 07/2014 neg for cocaine.  . Essential hypertension   . GERD (gastroesophageal reflux disease)    takes  otc  . Hepatitis C 2010   a. never treated - undectable virus 12/2013.  [name] Hyperlipidemia   . Hypertriglyceridemia   . NICM (nonischemic cardiomyopathy) ([place])    a. 12/2007 Echo: EF 45-50%;  b. 07/2014 Echo: EF 20-25%, diff HK, mod dil LA, sev dil RA, mild TR, PASP 31 mmHg.  . PUD (peptic ulcer disease)   . Sleep apnea    was tested here at [facility].Marland KitchenMarland KitchenMarland KitchenNever heard anymore about it (  03/30/2013)  . Spinal cord stimulator status    has had for 1 yr--inserted by [facility]  . Tobacco abuse   . Transaminitis     Past Surgical History:  Procedure Laterality Date  . ANTERIOR CERVICAL DECOMP/DISCECTOMY FUSION  04/05/2012   Procedure: ANTERIOR CERVICAL DECOMPRESSION/DISCECTOMY FUSION 1 LEVEL;  Surgeon: Elaina Hoops, MD;  Location: [place] [facility]S;  Service: Neurosurgery;  Laterality: N/A;  Cervical five-six anterior cervical decompression and fusion  . BILIARY STENT PLACEMENT  06/20/2019   Procedure: BILIARY STENT PLACEMENT;  Surgeon: Rush Landmark Telford Nab., MD;  Location: [place];  Service: Gastroenterology;;  . BILIARY STENT PLACEMENT N/A 09/21/2019   Procedure: BILIARY STENT PLACEMENT;  Surgeon: Irving Copas., MD;  Location: [facility];  Service: Gastroenterology;  Laterality: N/A;  . BIOPSY  03/31/2019   Procedure: BIOPSY;  Surgeon: Milus Banister, MD;  Location: [facility]OSCOPY;  Service: Endoscopy;;  . BIOPSY  09/21/2019   Procedure: BIOPSY;  Surgeon: Irving Copas., MD;  Location: [facility];  Service: Gastroenterology;;  . COLONOSCOPY WITH PROPOFOL N/A 08/28/2016   Procedure: COLONOSCOPY WITH PROPOFOL;  Surgeon: Milus Banister, MD;  Location: [facility]OS[facility];  Service: Endoscopy;  Laterality: N/A;  . ENDOSCOPIC MUCOSAL RESECTION  06/20/2019   Procedure: ENDOSCOPIC MUCOSAL RESECTION;  Surgeon: Rush Landmark, Telford Nab., MD;  Location: [facility] ENDOSCOPY;  Service: Gastroenterology;;  . ENDOSCOPIC RETROGRADE CHOLANGIOPANCREATOGRAPHY (ERCP) WITH PROPOFOL N/A 09/21/2019   Procedure: ENDOSCOPIC RETROGRADE CHOLANGIOPANCREATOGRAPHY (ERCP) WITH PROPOFOL;  Surgeon: Irving Copas., MD;  Location: [facility];  Service: Gastroenterology;  Laterality: N/A;  . ERCP N/A 06/20/2019   Procedure: ENDOSCOPIC RETROGRADE CHOLANGIOPANCREATOGRAPHY (ERCP);  Surgeon: Irving Copas., MD;  Location: [place];  Service: Gastroenterology;  Laterality: N/A;  . ESOPHAGOGASTRODUODENOSCOPY (EGD) WITH PROPOFOL N/A 08/28/2016   Procedure: ESOPHAGOGASTRODUODENOSCOPY (EGD) WITH PROPOFOL;  Surgeon: Milus Banister, MD;  Location: [facility];  Service: Endoscopy;  Laterality: N/A;  . ESOPHAGOGASTRODUODENOSCOPY (EGD) WITH PROPOFOL N/A 03/31/2019   Procedure: ESOPHAGOGASTRODUODENOSCOPY (EGD) WITH PROPOFOL;  Surgeon: Milus Banister, MD;  Location: [facility];  Service: Endoscopy;  Laterality: N/A;  . ESOPHAGOGASTRODUODENOSCOPY (EGD) WITH PROPOFOL N/A 06/20/2019   Procedure: ESOPHAGOGASTRODUODENOSCOPY (EGD) WITH PROPOFOL;  Surgeon: Rush Landmark Telford Nab., MD;  Location: [place];  Service: Gastroenterology;  Laterality: N/A;  . ESOPHAGOGASTRODUODENOSCOPY (EGD) WITH PROPOFOL N/A 09/21/2019   Procedure:  ESOPHAGOGASTRODUODENOSCOPY (EGD) WITH PROPOFOL;  Surgeon: Rush Landmark Telford Nab., MD;  Location: [facility];  Service: Gastroenterology;  Laterality: N/A;  . EUS N/A 03/31/2019   Procedure: UPPER ENDOSCOPIC ULTRASOUND (EUS) RADIAL;  Surgeon: Milus Banister, MD;  Location: [facility];  Service: Endoscopy;  Laterality: N/A;  . FOOT SURGERY Right 2012?   "took piece off that was hurting me" (03/30/2013)  . HARDWARE REMOVAL     "took screw out of my back; it had damaged my nerve" (03/30/2013)--lumber  . HEMOSTASIS CLIP PLACEMENT  06/20/2019   Procedure: HEMOSTASIS CLIP PLACEMENT;  Surgeon: Irving Copas., MD;  Location: [place];  Service: Gastroenterology;;  . INCISION AND DRAINAGE ABSCESS Left    "lower jaw, tooth abscess" (03/30/2013)  . LEFT HEART CATHETERIZATION WITH CORONARY ANGIOGRAM N/A 08/03/2014   Procedure: LEFT HEART CATHETERIZATION WITH CORONARY ANGIOGRAM;  Surgeon: Blane Ohara, MD;  Location: [facility] CATH LAB;  Service: Cardiovascular;  Laterality: N/A;  . PANCREATIC STENT PLACEMENT  06/20/2019   Procedure: PANCREATIC STENT PLACEMENT;  Surgeon: Rush Landmark Telford Nab., MD;  Location: [place];  Service: Gastroenterology;;  . PANCREATIC STENT PLACEMENT  09/21/2019   Procedure:  PANCREATIC STENT PLACEMENT;  Surgeon: Rush Landmark Telford Nab., MD;  Location: [facility];  Service: Gastroenterology;;  . POLYPECTOMY  06/20/2019   Procedure: POLYPECTOMY;  Surgeon: Irving Copas., MD;  Location: [place];  Service: Gastroenterology;;  . POSTERIOR LUMBAR FUSION     "L4-5" (03/31/2103)  . REMOVAL OF STONES  09/21/2019   Procedure: REMOVAL OF STONES;  Surgeon: Rush Landmark Telford Nab., MD;  Location: [facility];  Service: Gastroenterology;;  . Joan Mayans  06/20/2019   Procedure: Joan Mayans;  Surgeon: Irving Copas., MD;  Location: [place];  Service: Gastroenterology;;  . SPINAL CORD STIMULATOR IMPLANT  11/14/2012  . STENT REMOVAL  09/21/2019    Procedure: STENT REMOVAL;  Surgeon: Irving Copas., MD;  Location: [facility];  Service: Gastroenterology;;  . Lia Foyer LIFTING INJECTION  06/20/2019   Procedure: SUBMUCOSAL LIFTING INJECTION;  Surgeon: Irving Copas., MD;  Location: [place];  Service: Gastroenterology;;  . UPPER ESOPHAGEAL ENDOSCOPIC ULTRASOUND (EUS) N/A 06/20/2019   Procedure: UPPER ESOPHAGEAL ENDOSCOPIC ULTRASOUND (EUS);  Surgeon: Irving Copas., MD;  Location: [place];  Service: Gastroenterology;  Laterality: N/A;  . WRIST SURGERY Right 1980's   repair of tendons and nerves     Current Outpatient Medications  Medication Sig Dispense Refill  . aspirin EC 81 MG EC tablet Take 1 tablet (81 mg total) by mouth daily. 30 tablet 0  . Ensure Plus (ENSURE PLUS) LIQD Take 237 mLs by mouth 2 (two) times daily between meals.    [name] ENTRESTO 49-51 MG Take 1 tablet by mouth twice daily. (Patient taking differently: Take 1 tablet by mouth 2 (two) times daily. ) 60 tablet 4  . gemfibrozil (LOPID) 600 MG tablet Take 1 tablet (600 mg total) by mouth 2 (two) times daily before a meal. 60 tablet 5  . metoprolol succinate (TOPROL-XL) 100 MG 24 hr tablet Take 1 tablet by mouth daily. Take with or immediately following a meal. (Patient taking differently: Take 100 mg by mouth daily. ) 90 tablet 3  . Multiple Vitamin (MULTIVITAMIN WITH MINERALS) TABS tablet Take 1 tablet by mouth daily.    [name] omega-3 acid ethyl esters (LOVAZA) 1 G capsule Take 1 capsule (1 g total) by mouth 2 (two) times daily. 60 capsule 5  . omeprazole (PRILOSEC) 40 MG capsule Take 1 capsule (40 mg total) by mouth 2 (two) times daily before a meal. 60 capsule 4  . oxycodone (ROXICODONE) 30 MG immediate release tablet Take 30 mg by mouth 4 (four) times daily as needed for pain.   0  . rosuvastatin (CRESTOR) 40 MG tablet Take 40 mg by mouth daily.    . sertraline (ZOLOFT) 100 MG tablet Take 100 mg by mouth daily.     [name] spironolactone  (ALDACTONE) 25 MG tablet Take 1 tablet by mouth every other day. (Patient taking differently: Take 25 mg by mouth every other day. In the morning) 15 tablet 4  . torsemide (DEMADEX) 20 MG tablet Take 2 tablets by mouth daily . 60 tablet 5  . Vitamin D, Ergocalciferol, (DRISDOL) 1.25 MG (50000 UT) CAPS capsule Take 50,000 Units by mouth every Friday.     No current facility-administered medications for this visit.    Allergies:   Hydrocodone-acetaminophen    ROS:  Please see the history of present illness.   Otherwise, review of systems are positive for {NONE DEFAULTED:18576::"none"}.   All other systems are reviewed and negative.    PHYSICAL EXAM: VS:  There were no vitals taken for this visit. ,  BMI There is no height or weight on file to calculate BMI. GENERAL:  Well appearing NECK:  No jugular venous distention, waveform within normal limits, carotid upstroke brisk and symmetric, no bruits, no thyromegaly LUNGS:  Clear to auscultation bilaterally CHEST:  Unremarkable HEART:  PMI not displaced or sustained,S1 and S2 within normal limits, no S3, no S4, no clicks, no rubs, *** murmurs ABD:  Flat, positive bowel sounds normal in frequency in pitch, no bruits, no rebound, no guarding, no midline pulsatile mass, no hepatomegaly, no splenomegaly EXT:  2 plus pulses throughout, no edema, no cyanosis no clubbing    ***GENERAL:  Well appearing HEENT:  Pupils equal round and reactive, fundi not visualized, oral mucosa unremarkable NECK:  No jugular venous distention, waveform within normal limits, carotid upstroke brisk and symmetric, no bruits, no thyromegaly LYMPHATICS:  No cervical, inguinal adenopathy LUNGS:  Clear to auscultation bilaterally BACK:  No CVA tenderness CHEST:  Unremarkable HEART:  PMI not displaced or sustained,S1 and S2 within normal limits, no S3, no S4, no clicks, no rubs, *** murmurs ABD:  Flat, positive bowel sounds normal in frequency in pitch, no bruits, no rebound,  no guarding, no midline pulsatile mass, no hepatomegaly, no splenomegaly EXT:  2 plus pulses throughout, no edema, no cyanosis no clubbing SKIN:  No rashes no nodules NEURO:  Cranial nerves II through XII grossly intact, motor grossly intact throughout PSYCH:  Cognitively intact, oriented to person place and time    EKG:  EKG {ACTION; IS/IS GI:[id number] ordered today. The ekg ordered today demonstrates ***   Recent Labs: 02/11/2019: NT-Pro BNP 485 08/05/2019: ALT 12; BUN 16; Creatinine, Ser 1.13; Hemoglobin 13.6; Platelets 223.0; Potassium 3.7; Sodium 140    Lipid Panel    Component Value Date/Time   CHOL 329 (H) 03/29/2013 1238   TRIG 410 (H) 05/26/2013 1443   HDL 21 (L) 03/29/2013 1238   CHOLHDL 15.7 03/29/2013 1238   VLDL NOT CALC 03/29/2013 1238   LDLCALC NOT CALC 03/29/2013 1238   LDLDIRECT 89 12/09/2012 1225      Wt Readings from Last 3 Encounters:  09/21/19 220 lb (99.8 kg)  08/05/19 217 lb 6 oz (98.6 kg)  06/20/19 214 lb 3.6 oz (97.2 kg)      Other studies Reviewed: Additional studies/ records that were reviewed today include: ***. Review of the above records demonstrates:  Please see elsewhere in the note.  ***   ASSESSMENT AND PLAN:  Nonischemic cardiomyopathy:  ***  hought to be related to alcohol abuse.  He has stopped drinking for several years now.  EF has improved from initial reading of 20% to 25% up to 40 to 45% on most recent echo.  He continues on Entresto 49/51 mg daily metoprolol succinate 100 mg daily, and offers no complaints of rapid heart rhythm or dyspnea.  Hypertension:   ***   reports that his blood pressures been in the [id number] systolic range on most recent office appointments.  He does not have a blood pressure cuff to check his own blood pressure at home.  I will arrange for him to have one sent via our social worker.  In the interim the patient will continue spironolactone, Entresto, metoprolol, as directed.  I will request labs which were  recently drawn by [facility].  Hypercholesterolemia:   ***  patient continues on rosuvastatin 20 mg daily.  Follow-up lipids and LFTs will need to be repeated after he has his procedure completed as described above.  Covid education:    ***  Current medicines are reviewed at length with the patient today.  The patient {ACTIONS; HAS/DOES NOT HAVE:19233} concerns regarding medicines.  The following changes have been made:  {PLAN; NO CHANGE:13088:s}  Labs/ tests ordered today include: *** No orders of the defined types were placed in this encounter.    Disposition:   FU with ***    Signed, Minus Breeding, MD  11/07/2019 6:13 PM    [facility]

## 2019-11-09 ENCOUNTER — Ambulatory Visit: Payer: Medicare Other | Admitting: Cardiology

## 2019-11-10 NOTE — Progress Notes (Signed)
Cardiology Office Note   Date:  11/11/2019   ID:  Jerome Adams, DOB [birth year], MRN [id number]  PCP:  Antonietta Jewel, MD  Cardiologist:   No primary care provider on file.  Chief Complaint  Patient presents with  . Cardiomyopathy      History of Present Illness: Jerome Adams is a 59 y.o. male who presents for  assessment and management of dilated cardiomyopathy, postulated to be related to alcohol abuse, EF 20% to 25% in 2016, improvement to 35% to 40% in 2018.  Repeat echo in 2020 with an EF of 40 - 45%.    Since I last saw him he has been doing relatively well.  He had stent placed in his pancreatic duct and some biopsies but this looks like this was benign.  He thinks he is breathing okay although he will get short of breath lying flat and he sleeps somewhat propped up.  His weight has been creeping up.  He thinks he has a little extra fluid may be in his abdomen.  He never swells in his legs.  He is not describing chest pressure, neck or arm discomfort.  He is not having any palpitations, presyncope or syncope.  He drinks a lot of fluids because he is always thirsty but he does not eat a lot of salt.   Past Medical History:  Diagnosis Date  . Alcohol abuse   . Arthritis    "joints" (03/30/2013)  . CHF (congestive heart failure) ([place])   . Chronic lower back pain   . Chronic pancreatitis ([place])   . CKD (chronic kidney disease), stage II   . COPD (chronic obstructive pulmonary disease) ([place]) 07/31/2014  . Depression   . Drug abuse ([place])    a. Remote drug abuse (cocaine). UDS 07/2014 neg for cocaine.  . Essential hypertension   . GERD (gastroesophageal reflux disease)    takes  otc  . Hepatitis C 2010   a. never treated - undectable virus 12/2013.  [name] Hyperlipidemia   . Hypertriglyceridemia   . NICM (nonischemic cardiomyopathy) ([place])    a. 12/2007 Echo: EF 45-50%;  b. 07/2014 Echo: EF 20-25%, diff HK, mod dil LA, sev dil RA, mild TR, PASP 31 mmHg.  . PUD (peptic ulcer  disease)   . Sleep apnea    was tested here at [facility] [place].Marland KitchenMarland KitchenMarland KitchenNever heard anymore about it (03/30/2013)  . Spinal cord stimulator status    has had for 1 yr--inserted by [facility]  . Tobacco abuse   . Transaminitis     Past Surgical History:  Procedure Laterality Date  . ANTERIOR CERVICAL DECOMP/DISCECTOMY FUSION  04/05/2012   Procedure: ANTERIOR CERVICAL DECOMPRESSION/DISCECTOMY FUSION 1 LEVEL;  Surgeon: Elaina Hoops, MD;  Location: [place] [facility];  Service: Neurosurgery;  Laterality: N/A;  Cervical five-six anterior cervical decompression and fusion  . BILIARY STENT PLACEMENT  06/20/2019   Procedure: BILIARY STENT PLACEMENT;  Surgeon: Rush Landmark Telford Nab., MD;  Location: [place];  Service: Gastroenterology;;  . BILIARY STENT PLACEMENT N/A 09/21/2019   Procedure: BILIARY STENT PLACEMENT;  Surgeon: Irving Copas., MD;  Location[facility];  Service: Gastroenterology;  Laterality: N/A;  . BIOPSY  03/31/2019   Procedure: BIOPSY;  Surgeon: Milus Banister, MD;  Location: [facility];  Service: Endoscopy;;  . BIOPSY  09/21/2019   Procedure: BIOPSY;  Surgeon: Irving Copas., MD;  Location: [facility];  Service: Gastroenterology;;  . COLONOSCOPY WITH PROPOFOL N/A 08/28/2016   Procedure: COLONOSCOPY WITH PROPOFOL;  Surgeon: Milus Banister, MD;  Location: [facility];  Service: Endoscopy;  Laterality: N/A;  . ENDOSCOPIC MUCOSAL RESECTION  06/20/2019   Procedure: ENDOSCOPIC MUCOSAL RESECTION;  Surgeon: Rush Landmark, Telford Nab., MD;  Location: [facility];  Service: Gastroenterology;;  . ENDOSCOPIC RETROGRADE CHOLANGIOPANCREATOGRAPHY (ERCP) WITH PROPOFOL N/A 09/21/2019   Procedure: ENDOSCOPIC RETROGRADE CHOLANGIOPANCREATOGRAPHY (ERCP) WITH PROPOFOL;  Surgeon: Irving Copas., MD;  Location: [facility];  Service: Gastroenterology;  Laterality: N/A;  . ERCP N/A 06/20/2019   Procedure: ENDOSCOPIC RETROGRADE CHOLANGIOPANCREATOGRAPHY (ERCP);  Surgeon: Irving Copas.,  MD;  Location: [place];  Service: Gastroenterology;  Laterality: N/A;  . ESOPHAGOGASTRODUODENOSCOPY (EGD) WITH PROPOFOL N/A 08/28/2016   Procedure: ESOPHAGOGASTRODUODENOSCOPY (EGD) WITH PROPOFOL;  Surgeon: Milus Banister, MD;  [facility];  Service: Endoscopy;  Laterality: N/A;  . ESOPHAGOGASTRODUODENOSCOPY (EGD) WITH PROPOFOL N/A 03/31/2019   Procedure: ESOPHAGOGASTRODUODENOSCOPY (EGD) WITH PROPOFOL;  Surgeon: Milus Banister, MD;  Location: [facility];  Service: Endoscopy;  Laterality: N/A;  . ESOPHAGOGASTRODUODENOSCOPY (EGD) WITH PROPOFOL N/A 06/20/2019   Procedure: ESOPHAGOGASTRODUODENOSCOPY (EGD) WITH PROPOFOL;  Surgeon: Rush Landmark Telford Nab., MD;  Location: [place];  Service: Gastroenterology;  Laterality: N/A;  . ESOPHAGOGASTRODUODENOSCOPY (EGD) WITH PROPOFOL N/A 09/21/2019   Procedure: ESOPHAGOGASTRODUODENOSCOPY (EGD) WITH PROPOFOL;  Surgeon: Rush Landmark Telford Nab., MD;  [facility];  Service: Gastroenterology;  Laterality: N/A;  . EUS N/A 03/31/2019   Procedure: UPPER ENDOSCOPIC ULTRASOUND (EUS) RADIAL;  Surgeon: Milus Banister, MD;  Location: [facility];  Service: Endoscopy;  Laterality: N/A;  . FOOT SURGERY Right 2012?   "took piece off that was hurting me" (03/30/2013)  . HARDWARE REMOVAL     "took screw out of my back; it had damaged my nerve" (03/30/2013)--lumber  . HEMOSTASIS CLIP PLACEMENT  06/20/2019   Procedure: HEMOSTASIS CLIP PLACEMENT;  Surgeon: Irving Copas., MD;  Location: [place];  Service: Gastroenterology;;  . INCISION AND DRAINAGE ABSCESS Left    "lower jaw, tooth abscess" (03/30/2013)  . LEFT HEART CATHETERIZATION WITH CORONARY ANGIOGRAM N/A 08/03/2014   Procedure: LEFT HEART CATHETERIZATION WITH CORONARY ANGIOGRAM;  Surgeon: Blane Ohara, MD;  Location: [facility] CATH LAB;  Service: Cardiovascular;  Laterality: N/A;  . PANCREATIC STENT PLACEMENT  06/20/2019   Procedure: PANCREATIC STENT PLACEMENT;  Surgeon: Rush Landmark Telford Nab., MD;  Location: [place];  Service: Gastroenterology;;  . PANCREATIC STENT PLACEMENT  09/21/2019   Procedure: PANCREATIC STENT PLACEMENT;  Surgeon: Irving Copas., MD;  Location: [facility];  Service: Gastroenterology;;  . POLYPECTOMY  06/20/2019   Procedure: POLYPECTOMY;  Surgeon: Irving Copas., MD;  Location: [place];  Service: Gastroenterology;;  . POSTERIOR LUMBAR FUSION     "L4-5" (03/31/2103)  . REMOVAL OF STONES  09/21/2019   Procedure: REMOVAL OF STONES;  Surgeon: Rush Landmark Telford Nab., MD;  Location: [facility];  Service: Gastroenterology;;  . Joan Mayans  06/20/2019   Procedure: Joan Mayans;  Surgeon: Irving Copas., MD;  Location: [place];  Service: Gastroenterology;;  . SPINAL CORD STIMULATOR IMPLANT  11/14/2012  . STENT REMOVAL  09/21/2019   Procedure: STENT REMOVAL;  Surgeon: Irving Copas., MD;  Location: [facility];  Service: Gastroenterology;;  . Lia Foyer LIFTING INJECTION  06/20/2019   Procedure: SUBMUCOSAL LIFTING INJECTION;  Surgeon: Irving Copas., MD;  Location: [place];  Service: Gastroenterology;;  . UPPER ESOPHAGEAL ENDOSCOPIC ULTRASOUND (EUS) N/A 06/20/2019   Procedure: UPPER ESOPHAGEAL ENDOSCOPIC ULTRASOUND (EUS);  Surgeon: Irving Copas., MD;  Location: [place];  Service: Gastroenterology;  Laterality: N/A;  . WRIST SURGERY  Right 1980's   repair of tendons and nerves     Current Outpatient Medications  Medication Sig Dispense Refill  . aspirin EC 81 MG EC tablet Take 1 tablet (81 mg total) by mouth daily. 30 tablet 0  . Ensure Plus (ENSURE PLUS) LIQD Take 237 mLs by mouth 2 (two) times daily between meals.    [name] gemfibrozil (LOPID) 600 MG tablet Take 1 tablet (600 mg total) by mouth 2 (two) times daily before a meal. 60 tablet 5  . metoprolol succinate (TOPROL-XL) 100 MG 24 hr tablet Take 1 tablet by mouth daily. Take with or immediately following a meal. (Patient  taking differently: Take 100 mg by mouth daily. ) 90 tablet 3  . Multiple Vitamin (MULTIVITAMIN WITH MINERALS) TABS tablet Take 1 tablet by mouth daily.    [name] omega-3 acid ethyl esters (LOVAZA) 1 G capsule Take 1 capsule (1 g total) by mouth 2 (two) times daily. 60 capsule 5  . omeprazole (PRILOSEC) 40 MG capsule Take 1 capsule (40 mg total) by mouth 2 (two) times daily before a meal. 60 capsule 4  . oxycodone (ROXICODONE) 30 MG immediate release tablet Take 30 mg by mouth 4 (four) times daily as needed for pain.   0  . rosuvastatin (CRESTOR) 40 MG tablet Take 40 mg by mouth daily.    . sertraline (ZOLOFT) 100 MG tablet Take 100 mg by mouth daily.     [name] spironolactone (ALDACTONE) 25 MG tablet Take 1 tablet by mouth every other day. (Patient taking differently: Take 25 mg by mouth every other day. In the morning) 15 tablet 4  . torsemide (DEMADEX) 20 MG tablet Take 2 tablets (40 mg total) by mouth daily. 60 tablet 11  . Vitamin D, Ergocalciferol, (DRISDOL) 1.25 MG (50000 UT) CAPS capsule Take 50,000 Units by mouth every Friday.    . sacubitril-valsartan (ENTRESTO) 97-103 MG Take 1 tablet by mouth 2 (two) times daily. 60 tablet 11   No current facility-administered medications for this visit.    Allergies:   Hydrocodone-acetaminophen    ROS:  Please see the history of present illness.   Otherwise, review of systems are positive for none.   All other systems are reviewed and negative.    PHYSICAL EXAM: VS:  BP 120/80   Pulse (!) 52   Ht 5\' 10"  (1.778 m)   Wt 227 lb (103 kg)   SpO2 95%   BMI 32.57 kg/m  , BMI Body mass index is 32.57 kg/m. GENERAL:  Well appearing NECK:  No jugular venous distention, waveform within normal limits, carotid upstroke brisk and symmetric, no bruits, no thyromegaly LUNGS:  Clear to auscultation bilaterally CHEST:  Unremarkable HEART:  PMI not displaced or sustained,S1 and S2 within normal limits, no S3, no S4, no clicks, no rubs, no murmurs ABD:  Flat,  positive bowel sounds normal in frequency in pitch, no bruits, no rebound, no guarding, no midline pulsatile mass, no hepatomegaly, no splenomegaly EXT:  2 plus pulses throughout, no edema, no cyanosis no clubbing  EKG:  EKG is ordered today. The ekg ordered today demonstrates sinus bradycardia, rate 52, axis within normal limits, intervals within normal limits, poor anterior R wave progression, inferolateral T wave inversions unchanged from previous.   Recent Labs: 02/11/2019: NT-Pro BNP 485 08/05/2019: ALT 12; BUN 16; Creatinine, Ser 1.13; Hemoglobin 13.6; Platelets 223.0; Potassium 3.7; Sodium 140    Lipid Panel    Component Value Date/Time   CHOL 329 (H) 03/29/2013 1238  TRIG 410 (H) 05/26/2013 1443   HDL 21 (L) 03/29/2013 1238   CHOLHDL 15.7 03/29/2013 1238   VLDL NOT CALC 03/29/2013 1238   LDLCALC NOT CALC 03/29/2013 1238   LDLDIRECT 89 12/09/2012 1225      Wt Readings from Last 3 Encounters:  11/11/19 227 lb (103 kg)  09/21/19 220 lb (99.8 kg)  08/05/19 217 lb 6 oz (98.6 kg)      Other studies Reviewed: Additional studies/ records that were reviewed today include: GI records. Review of the above records demonstrates:  Please see elsewhere in the note.     ASSESSMENT AND PLAN:  Nonischemic cardiomyopathy:    He seems to be doing relatively well.  His ejection fraction is slightly improved.  I am going to increase his Entresto to 97/103.  I am going to give him an extra 20 mg of Demadex for 2 days and then go back to 40 mg a day which is what he has been taking.  I talked about reducing his fluid intake.  I will check a basic metabolic profile in about 2 weeks.   Hypertension:    His blood pressures been treated in the context of managing his cardiomyopathy.  Hypercholesterolemia:  I will check a lipid profile when he comes back in 2 weeks.  Covid education:    He just had his second dose of vaccine.  Current medicines are reviewed at length with the patient today.   The patient does not have concerns regarding medicines.  The following changes have been made:  As above  Labs/ tests ordered today include: None for 3 months  Orders Placed This Encounter  Procedures  . Basic metabolic panel  . Lipid panel  . EKG 12-Lead     Disposition:   FU with Jory Sims DNP in 3 months.   Signed, Minus Breeding, MD  11/11/2019 4:57 PM    [place]

## 2019-11-11 ENCOUNTER — Other Ambulatory Visit: Payer: Self-pay

## 2019-11-11 ENCOUNTER — Encounter: Payer: Self-pay | Admitting: Cardiology

## 2019-11-11 ENCOUNTER — Ambulatory Visit (INDEPENDENT_AMBULATORY_CARE_PROVIDER_SITE_OTHER): Payer: Medicare Other | Admitting: Cardiology

## 2019-11-11 VITALS — BP 120/80 | HR 52 | Ht 70.0 in | Wt 227.0 lb

## 2019-11-11 DIAGNOSIS — I10 Essential (primary) hypertension: Secondary | ICD-10-CM

## 2019-11-11 DIAGNOSIS — E785 Hyperlipidemia, unspecified: Secondary | ICD-10-CM | POA: Diagnosis not present

## 2019-11-11 DIAGNOSIS — I428 Other cardiomyopathies: Secondary | ICD-10-CM

## 2019-11-11 DIAGNOSIS — Z7189 Other specified counseling: Secondary | ICD-10-CM

## 2019-11-11 MED ORDER — TORSEMIDE 20 MG PO TABS: 40.0000 mg | ORAL_TABLET | Freq: Every day | ORAL | 11 refills | Status: DC

## 2019-11-11 MED ORDER — ENTRESTO 97-103 MG PO TABS: 1.0000 | ORAL_TABLET | Freq: Two times a day (BID) | ORAL | 11 refills | Status: DC

## 2019-11-11 NOTE — Patient Instructions (Addendum)
Medication Instructions:  TAKE 60MG  OF TORSEMIDE FOR 2 DAYS THEN REDUCE TO 40MG  DAILY INCREASE ENTRESTO TO 97-103 *If you need a refill on your cardiac medications before your next appointment, please call your pharmacy*  Lab Work: Your physician recommends that you return for lab work in 2 weeks (BMP, LIPIDS)  Testing/Procedures: NONE ORDERED THIS VISIT  Follow-Up: At [facility], you and your health needs are our priority.  As part of our continuing mission to provide you with exceptional heart care, we have created designated Provider Care Teams.  These Care Teams include your primary Cardiologist (physician) and Advanced Practice Providers (APPs -  Physician Assistants and Nurse Practitioners) who all work together to provide you with the care you need, when you need it.  Your next appointment:   3 month(s)  The format for your next appointment:   In Person  Provider:   Jory Sims, DNP

## 2019-12-06 ENCOUNTER — Telehealth: Payer: Self-pay | Admitting: Gastroenterology

## 2019-12-06 NOTE — Telephone Encounter (Signed)
The pt was constipated and took a bottle of mag citrate yesterday with good results.  He says today he some abd cramping. I advised it could be the mag citrate and he should take miralax daily to help control his bowels and prevent getting constipated.  He is going to see how he does overnight and call back if the cramping does not resolve.

## 2019-12-06 NOTE — Telephone Encounter (Signed)
Reasonable. If still having issues, then would get a KUB to see about stool burden. Certainly can get some basic labs to ensure he hasn't gotten pancreatitis or any LFT abnormalities with the previous stent that is in place. Please let DJ and I know what happens if he calls back tomorrow. Thanks. GM

## 2019-12-06 NOTE — Telephone Encounter (Signed)
Pt states that he was constipated for few days and took magnesium citrate yesterday. That helped him with bm but he states that his stomach is hurting a lot and he does not know what to take or do to relief the pain. Pls call pt.

## 2019-12-07 NOTE — Telephone Encounter (Signed)
Thank you Patty for update. Sorry to hear that he is still having issues. I will place the Inpatient GI service on this should they end up being called on to evaluate the patient. FYI DJ & HD & SG & AE.  Thanks. GM

## 2019-12-07 NOTE — Telephone Encounter (Signed)
Spoke with the pt and he is on the way to [facility] ED for evaluation of "knots in my stomach- I think I have a hernia".

## 2020-01-15 ENCOUNTER — Other Ambulatory Visit: Payer: Self-pay | Admitting: Cardiology

## 2020-01-16 ENCOUNTER — Other Ambulatory Visit: Payer: Self-pay | Admitting: Cardiology

## 2020-01-16 NOTE — Telephone Encounter (Signed)
New Messag:     Pt said he received a message from [facility] saying his medicine was denied. He does not know what medicine was denied and why it was denied.

## 2020-01-16 NOTE — Telephone Encounter (Signed)
Need to do some investigating.

## 2020-01-25 MED ORDER — ENTRESTO 97-103 MG PO TABS: 1.0000 | ORAL_TABLET | Freq: Two times a day (BID) | ORAL | 3 refills | Status: DC

## 2020-01-25 NOTE — Telephone Encounter (Signed)
Spoke with pt, he reports we need to call pill pack. Discussed with pill pack and refill for entresto sent to the pharmacy. They are going to contact the patient.

## 2020-02-09 ENCOUNTER — Telehealth: Payer: Self-pay | Admitting: Gastroenterology

## 2020-02-10 ENCOUNTER — Other Ambulatory Visit: Payer: Self-pay

## 2020-02-10 DIAGNOSIS — R945 Abnormal results of liver function studies: Secondary | ICD-10-CM

## 2020-02-10 DIAGNOSIS — R7989 Other specified abnormal findings of blood chemistry: Secondary | ICD-10-CM

## 2020-02-10 DIAGNOSIS — Q453 Other congenital malformations of pancreas and pancreatic duct: Secondary | ICD-10-CM

## 2020-02-10 DIAGNOSIS — D135 Benign neoplasm of extrahepatic bile ducts: Secondary | ICD-10-CM

## 2020-02-10 NOTE — Telephone Encounter (Signed)
ERCP scheduled, pt instructed and medications reviewed.  Patient instructions mailed to home.  Patient to call with any questions or concerns. The pt has confirmed that he will look for the instructions in My Chart and call if he has any questions.  COVID test on 9/16 at 1005 am and ERCP on 04/16/20 at 1030 am at [facility] with Dr Rush Landmark

## 2020-02-12 NOTE — Progress Notes (Signed)
Cardiology Office Note   Date:  02/13/2020   ID:  Jerome Adams, DOB Jul 13, 1961, MRN [id number]  PCP:  Antonietta Jewel, MD  Cardiologist: Dr. Percival Spanish  CC: Follow-up   History of Present Illness: Jerome Adams is a 59 y.o. male who presents for ongoing assessment and management of dilated cardiomyopathy, postulated to be related to alcohol abuse, EF of 20% to 25% in 2016 with improvement to 35 to 45% in 2018 and again in 2020.  He has a history of stents to his pancreatic duct and biopsies were completed, which apparently were benign.  On last office visit with Dr. Percival Spanish on 11/11/2019 he denied any chest pain neck or arm discomfort.  He denied palpitations syncope or presyncope.  He did complain of some shortness of breath lying flat but not true PND or orthopnea.  On that office visit Dr. Percival Spanish gave him an extra dose of 20 mg of Demadex for 2 days (total of 60 mg) and then was to return to 40 mg daily.  Follow-up BMET and fasting lipids and LFTs were ordered.  Mr. Amico is scheduled for an ERCP on 04/17/2020.   He comes today complaining of insomnia, rapid heart rhythm, and fatigue.  He states that he has to climb stairs to get to his apartment and he is exhausted at the top of the stairs and his heart is racing.  It takes him a while to regain his breathing to normal.  He denies chest pain with this.  He also states that his heart rate sometimes elevates without any exertion.  He denies syncope or near syncope.  He states that he did have a sleep apnea test in the past but it was many years ago.  He has not been rechecked for this since.  He has been medically compliant.  He has his medications in a "pill pack" and has not taken any extra doses of torsemide.  His weight has gone up but he has become very sedentary and does admit to eating some frozen dinners and fast food.  Past Medical History:  Diagnosis Date  . Alcohol abuse   . Arthritis    "joints" (03/30/2013)  . CHF  (congestive heart failure) ([place])   . Chronic lower back pain   . Chronic pancreatitis ([place])   . CKD (chronic kidney disease), stage II   . COPD (chronic obstructive pulmonary disease) ([place]) 07/31/2014  . Depression   . Drug abuse ([place])    a. Remote drug abuse (cocaine). UDS 07/2014 neg for cocaine.  . Essential hypertension   . GERD (gastroesophageal reflux disease)    takes  otc  . Hepatitis C 2010   a. never treated - undectable virus 12/2013.  [name] Hyperlipidemia   . Hypertriglyceridemia   . NICM (nonischemic cardiomyopathy) ([place])    a. 12/2007 Echo: EF 45-50%;  b. 07/2014 Echo: EF 20-25%, diff HK, mod dil LA, sev dil RA, mild TR, PASP 31 mmHg.  . PUD (peptic ulcer disease)   . Sleep apnea    was tested here at [facility].Marland KitchenMarland KitchenMarland KitchenNever heard anymore about it (03/30/2013)  . Spinal cord stimulator status    has had for 1 yr--inserted by [facility]  . Tobacco abuse   . Transaminitis     Past Surgical History:  Procedure Laterality Date  . ANTERIOR CERVICAL DECOMP/DISCECTOMY FUSION  04/05/2012   Procedure: ANTERIOR CERVICAL DECOMPRESSION/DISCECTOMY FUSION 1 LEVEL;  Surgeon: Elaina Hoops, MD;  Location: [facility];  Service: Neurosurgery;  Laterality: N/A;  Cervical five-six anterior cervical decompression and fusion  . BILIARY STENT PLACEMENT  06/20/2019   Procedure: BILIARY STENT PLACEMENT;  Surgeon: Rush Landmark Telford Nab., MD;  Location: [place];  Service: Gastroenterology;;  . BILIARY STENT PLACEMENT N/A 09/21/2019   Procedure: BILIARY STENT PLACEMENT;  Surgeon: Irving Copas., MD;  Location: [facility];  Service: Gastroenterology;  Laterality: N/A;  . BIOPSY  03/31/2019   Procedure: BIOPSY;  Surgeon: Milus Banister, MD;  Location: [facility];  Service: Endoscopy;;  . BIOPSY  09/21/2019   Procedure: BIOPSY;  Surgeon: Irving Copas., MD;  Location: [facility];  Service: Gastroenterology;;  . COLONOSCOPY WITH PROPOFOL N/A 08/28/2016   Procedure: COLONOSCOPY WITH PROPOFOL;   Surgeon: Milus Banister, MD;  [facility]: [facility];  Service: Endoscopy;  Laterality: N/A;  . ENDOSCOPIC MUCOSAL RESECTION  06/20/2019   Procedure: ENDOSCOPIC MUCOSAL RESECTION;  Surgeon: Rush Landmark, Telford Nab., MD;  Location: [facility];  Service: Gastroenterology;;  . ENDOSCOPIC RETROGRADE CHOLANGIOPANCREATOGRAPHY (ERCP) WITH PROPOFOL N/A 09/21/2019   Procedure: ENDOSCOPIC RETROGRADE CHOLANGIOPANCREATOGRAPHY (ERCP) WITH PROPOFOL;  Surgeon: Irving Copas., MD;  [facility];  Service: Gastroenterology;  Laterality: N/A;  . ERCP N/A 06/20/2019   Procedure: ENDOSCOPIC RETROGRADE CHOLANGIOPANCREATOGRAPHY (ERCP);  Surgeon: Irving Copas., MD;  Location: [place];  Service: Gastroenterology;  Laterality: N/A;  . ESOPHAGOGASTRODUODENOSCOPY (EGD) WITH PROPOFOL N/A 08/28/2016   Procedure: ESOPHAGOGASTRODUODENOSCOPY (EGD) WITH PROPOFOL;  Surgeon: Milus Banister, MD;  [facility];  Service: Endoscopy;  Laterality: N/A;  . ESOPHAGOGASTRODUODENOSCOPY (EGD) WITH PROPOFOL N/A 03/31/2019   Procedure: ESOPHAGOGASTRODUODENOSCOPY (EGD) WITH PROPOFOL;  Surgeon: Milus Banister, MD;  [facility];  Service: Endoscopy;  Laterality: N/A;  . ESOPHAGOGASTRODUODENOSCOPY (EGD) WITH PROPOFOL N/A 06/20/2019   Procedure: ESOPHAGOGASTRODUODENOSCOPY (EGD) WITH PROPOFOL;  Surgeon: Rush Landmark Telford Nab., MD;  Location: [place];  Service: Gastroenterology;  Laterality: N/A;  . ESOPHAGOGASTRODUODENOSCOPY (EGD) WITH PROPOFOL N/A 09/21/2019   Procedure: ESOPHAGOGASTRODUODENOSCOPY (EGD) WITH PROPOFOL;  Surgeon: Rush Landmark Telford Nab., MD;  [facility];  Service: Gastroenterology;  Laterality: N/A;  . EUS N/A 03/31/2019   Procedure: UPPER ENDOSCOPIC ULTRASOUND (EUS) RADIAL;  Surgeon: Milus Banister, MD;  [facility];  Service: Endoscopy;  Laterality: N/A;  . FOOT SURGERY Right 2012?   "took piece off that was hurting me" (03/30/2013)  . HARDWARE REMOVAL      "took screw out of my back; it had damaged my nerve" (03/30/2013)--lumber  . HEMOSTASIS CLIP PLACEMENT  06/20/2019   Procedure: HEMOSTASIS CLIP PLACEMENT;  Surgeon: Irving Copas., MD;  Location: [place];  Service: Gastroenterology;;  . INCISION AND DRAINAGE ABSCESS Left    "lower jaw, tooth abscess" (03/30/2013)  . LEFT HEART CATHETERIZATION WITH CORONARY ANGIOGRAM N/A 08/03/2014   Procedure: LEFT HEART CATHETERIZATION WITH CORONARY ANGIOGRAM;  Surgeon: Blane Ohara, MD;  Location: [facility] CATH LAB;  Service: Cardiovascular;  Laterality: N/A;  . PANCREATIC STENT PLACEMENT  06/20/2019   Procedure: PANCREATIC STENT PLACEMENT;  Surgeon: Rush Landmark Telford Nab., MD;  Location: [place];  Service: Gastroenterology;;  . PANCREATIC STENT PLACEMENT  09/21/2019   Procedure: PANCREATIC STENT PLACEMENT;  Surgeon: Irving Copas., MD;  Location: [facility];  Service: Gastroenterology;;  . POLYPECTOMY  06/20/2019   Procedure: POLYPECTOMY;  Surgeon: Irving Copas., MD;  Location: [place];  Service: Gastroenterology;;  . POSTERIOR LUMBAR FUSION     "L4-5" (03/31/2103)  . REMOVAL OF STONES  09/21/2019   Procedure: REMOVAL OF STONES;  Surgeon: Rush Landmark Telford Nab., MD;  [facility];  Service: Gastroenterology;;  . Joan Mayans  06/20/2019   Procedure: SPHINCTEROTOMY;  Surgeon: Irving Copas., MD;  Location: [place];  Service: Gastroenterology;;  . SPINAL CORD STIMULATOR IMPLANT  11/14/2012  . STENT REMOVAL  09/21/2019   Procedure: STENT REMOVAL;  Surgeon: Irving Copas., MD;  Location: [facility];  Service: Gastroenterology;;  . Lia Foyer LIFTING INJECTION  06/20/2019   Procedure: SUBMUCOSAL LIFTING INJECTION;  Surgeon: Irving Copas., MD;  Location: [place];  Service: Gastroenterology;;  . UPPER ESOPHAGEAL ENDOSCOPIC ULTRASOUND (EUS) N/A 06/20/2019   Procedure: UPPER ESOPHAGEAL ENDOSCOPIC ULTRASOUND (EUS);  Surgeon:  Irving Copas., MD;  Location: [place];  Service: Gastroenterology;  Laterality: N/A;  . WRIST SURGERY Right 1980's   repair of tendons and nerves     Current Outpatient Medications  Medication Sig Dispense Refill  . aspirin EC 81 MG EC tablet Take 1 tablet (81 mg total) by mouth daily. 30 tablet 0  . Ensure Plus (ENSURE PLUS) LIQD Take 237 mLs by mouth 2 (two) times daily between meals.    . metoprolol succinate (TOPROL-XL) 100 MG 24 hr tablet Take 1 tablet by mouth daily. Take with or immediately following a meal. (Patient taking differently: Take 100 mg by mouth daily. ) 90 tablet 3  . Multiple Vitamin (MULTIVITAMIN WITH MINERALS) TABS tablet Take 1 tablet by mouth daily.    [name] omega-3 acid ethyl esters (LOVAZA) 1 G capsule Take 1 capsule (1 g total) by mouth 2 (two) times daily. 60 capsule 5  . omeprazole (PRILOSEC) 40 MG capsule Take 1 capsule (40 mg total) by mouth 2 (two) times daily before a meal. 60 capsule 4  . oxycodone (ROXICODONE) 30 MG immediate release tablet Take 30 mg by mouth 4 (four) times daily as needed for pain.   0  . rosuvastatin (CRESTOR) 40 MG tablet Take 40 mg by mouth daily.    . sacubitril-valsartan (ENTRESTO) 97-103 MG Take 1 tablet by mouth 2 (two) times daily. 180 tablet 3  . sertraline (ZOLOFT) 100 MG tablet Take 100 mg by mouth daily.     [name] spironolactone (ALDACTONE) 25 MG tablet Take 1 tablet by mouth every other day. 15 tablet 6  . torsemide (DEMADEX) 20 MG tablet Take 2 tablets (40 mg total) by mouth daily. 60 tablet 11  . Vitamin D, Ergocalciferol, (DRISDOL) 1.25 MG (50000 UT) CAPS capsule Take 50,000 Units by mouth every Friday.     No current facility-administered medications for this visit.    Allergies:   Hydrocodone-acetaminophen    Social History:  The patient  reports that he has been smoking cigarettes. He has a 30.00 pack-year smoking history. He has never used smokeless tobacco. He reports previous alcohol use of about 4.0  standard drinks of alcohol per week. He reports that he does not use drugs.   Family History:  The patient's family history includes Bone cancer in his mother; Breast cancer in his sister; Heart disease in his father and sister.    ROS: All other systems are reviewed and negative. Unless otherwise mentioned in H&P    PHYSICAL EXAM: VS:  BP 110/70 (BP Location: Left Arm, Patient Position: Sitting, Cuff Size: Normal)   Pulse (!) 56   Ht 5\' 10"  (1.778 m)   Wt 222 lb 6.4 oz (100.9 kg)   SpO2 96%   BMI 31.91 kg/m  , BMI Body mass index is 31.91 kg/m. GEN: Well nourished, well developed, in no acute distress HEENT: normal  Neck: no JVD, carotid bruits, or masses Cardiac: RRR; no murmurs, rubs, or gallops,no edema  Respiratory:  Clear to auscultation bilaterally, normal work of breathing GI: soft, nontender, nondistended, + BS MS: no deformity or atrophy Skin: warm and dry, no rash Neuro:  Strength and sensation are intact Psych: euthymic mood, full affect   EKG: Not completed this office visit  Recent Labs: 08/05/2019: ALT 12; BUN 16; Creatinine, Ser 1.13; Hemoglobin 13.6; Platelets 223.0; Potassium 3.7; Sodium 140    Lipid Panel    Component Value Date/Time   CHOL 329 (H) 03/29/2013 1238   TRIG 410 (H) 05/26/2013 1443   HDL 21 (L) 03/29/2013 1238   CHOLHDL 15.7 03/29/2013 1238   VLDL NOT CALC 03/29/2013 1238   LDLCALC NOT CALC 03/29/2013 1238   LDLDIRECT 89 12/09/2012 1225      Wt Readings from Last 3 Encounters:  02/13/20 222 lb 6.4 oz (100.9 kg)  11/11/19 227 lb (103 kg)  09/21/19 220 lb (99.8 kg)      Other studies Reviewed: Echocardiogram  1. The left ventricle has mild-moderately reduced systolic function, with  an ejection fraction of 40-45%. The cavity size was normal. Left  ventricular diastolic Doppler parameters are consistent with impaired  relaxation.  2. The right ventricle has normal systolic function. The cavity was  mildly enlarged. There is no  increase in right ventricular wall thickness.  3. Left atrial size was severely dilated.  4. Right atrial size was severely dilated.  5. Mild thickening of the mitral valve leaflet.  6. The aortic valve is tricuspid. Mild thickening of the aortic valve.  Mild calcification of the aortic valve.  7. The aorta is normal in size and structure.    ASSESSMENT AND PLAN:  1.  Nonischemic cardiomyopathy: Recent echocardiogram early 2020 with an EF of 40% to 45%, with severely dilated left atrial and right atrial size with normal LV systolic function and normal RV systolic function.  RV was slightly enlarged.  He complains of dyspnea on exertion.  May be multifactorial in the setting of deconditioning and decreased LV function.  He is advised to take an extra dose of torsemide as needed worsening shortness of breath especially if he had eaten something salty.  He does have an extra bottle of pills separate from his pill packs.  I will repeat his echocardiogram for changes in LV function, RV size, and mitral valve evaluation.  He will continue his current medication regimen.    He has had recent labs drawn by his primary care physician Dr. Sheryle Hail.  We will request those labs so that we can evaluate his kidney function on torsemide, spironolactone and Entresto. "[place]" information is provided.   2.  Rapid heart rate with palpitations: I will place a 2-week ZIO monitor to evaluate for PSVT, atrial fib, or any other arrhythmias.  I explained to him that this is not a pacemaker only a cardiac monitor to evaluate his heart rhythm and frequency of racing heart rate.  He verbalizes understanding.  May need to increase dose of metoprolol if necessary.  3.  Insomnia: I have advised him to speak with his PCP about a sleep aid.  If he does not fact have sleep apnea this would not be advisable for him to take a sleep aid.  He will need to follow-up with PCP concerning evaluation for OSA, especially of cardiac  monitor reveals PAF.   Current medicines are reviewed at length with the patient today.  I  have spent 30 minutes dedicated to the care of this patient on the date of this encounter to include pre-visit review of records, assessment, management and diagnostic testing,with shared decision making.  Labs/ tests ordered today include: Echocardiogram, 2-week ZIO cardiac monitor.  Phill Myron. West Pugh, ANP, AACC   02/13/2020 2:08 PM    [facility] [place] [address] Office [phone] Fax [phone]  Notice: This dictation was prepared with Dragon dictation along with smaller phrase technology. Any transcriptional errors that result from this process are unintentional and may not be corrected upon review.

## 2020-02-13 ENCOUNTER — Encounter: Payer: Self-pay | Admitting: Adult Health

## 2020-02-13 ENCOUNTER — Ambulatory Visit (INDEPENDENT_AMBULATORY_CARE_PROVIDER_SITE_OTHER): Payer: 59 | Admitting: Adult Health

## 2020-02-13 ENCOUNTER — Encounter: Payer: Self-pay | Admitting: *Deleted

## 2020-02-13 ENCOUNTER — Other Ambulatory Visit: Payer: Self-pay

## 2020-02-13 VITALS — BP 110/70 | HR 56 | Ht 70.0 in | Wt 222.4 lb

## 2020-02-13 DIAGNOSIS — R002 Palpitations: Secondary | ICD-10-CM | POA: Diagnosis not present

## 2020-02-13 DIAGNOSIS — R000 Tachycardia, unspecified: Secondary | ICD-10-CM

## 2020-02-13 DIAGNOSIS — I428 Other cardiomyopathies: Secondary | ICD-10-CM

## 2020-02-13 NOTE — Progress Notes (Signed)
Patient ID: Jerome Adams, male   DOB: Jul 24, 1961, 59 y.o.   MRN: [id number] Patient enrolled for [employer] to ship a 14 day ZIO XT long term holter monitor to his home.

## 2020-02-13 NOTE — Patient Instructions (Signed)
Medication Instructions:  Continue current medications  *If you need a refill on your cardiac medications before your next appointment, please call your pharmacy*   Lab Work: None Ordered   Testing/Procedures: Your physician has requested that you have an echocardiogram. Echocardiography is a painless test that uses sound waves to create images of your heart. It provides your doctor with information about the size and shape of your heart and how well your heart's chambers and valves are working. This procedure takes approximately one hour. There are no restrictions for this procedure.  Your physician has recommended that you wear a Zio monitor. Holter monitors are medical devices that record the heart's electrical activity. Doctors most often use these monitors to diagnose arrhythmias. Arrhythmias are problems with the speed or rhythm of the heartbeat. The monitor is a small, portable device. You can wear one while you do your normal daily activities. This is usually used to diagnose what is causing palpitations/syncope (passing out).   Follow-Up: At [facility], you and your health needs are our priority.  As part of our continuing mission to provide you with exceptional heart care, we have created designated Provider Care Teams.  These Care Teams include your primary Cardiologist (physician) and Advanced Practice Providers (APPs -  Physician Assistants and Nurse Practitioners) who all work together to provide you with the care you need, when you need it.  We recommend signing up for the patient portal called "MyChart".  Sign up information is provided on this After Visit Summary.  MyChart is used to connect with patients for Virtual Visits (Telemedicine).  Patients are able to view lab/test results, encounter notes, upcoming appointments, etc.  Non-urgent messages can be sent to your provider as well.   To learn more about what you can do with MyChart, go to [email or web address].     Your next appointment:   6 week(s)  The format for your next appointment:   In Person  Provider:   You may see Minus Breeding, MD or one of the following Advanced Practice Providers on your designated Care Team:    Rosaria Ferries, PA-C  Jory Sims, DNP, ANP  Cadence Kathlen Mody, PA-C    Other Instructions [place] Monitor Instructions   Your physician has requested you wear your ZIO patch monitor 14 days.   This is a single patch monitor.  [employer] supplies one patch monitor per enrollment.  Additional stickers are not available.   Please do not apply patch if you will be having a Nuclear Stress Test, Echocardiogram, Cardiac CT, MRI, or Chest Xray during the time frame you would be wearing the monitor. The patch cannot be worn during these tests.  You cannot remove and re-apply the ZIO XT patch monitor.   Your ZIO patch monitor will be sent USPS Priority mail from [facility] directly to your home address. The monitor may also be mailed to a PO BOX if home delivery is not available.   It may take 3-5 days to receive your monitor after you have been enrolled.   Once you have received you monitor, please review enclosed instructions.  Your monitor has already been registered assigning a specific monitor serial # to you.   Applying the monitor   Shave hair from upper left chest.   Hold abrader disc by orange tab.  Rub abrader in 40 strokes over left upper chest as indicated in your monitor instructions.   Clean area with 4 enclosed alcohol pads .  Use all pads  to assure are is cleaned thoroughly.  Let dry.   Apply patch as indicated in monitor instructions.  Patch will be place under collarbone on left side of chest with arrow pointing upward.   Rub patch adhesive wings for 2 minutes.Remove white label marked "1".  Remove white label marked "2".  Rub patch adhesive wings for 2 additional minutes.   While looking in a mirror, press and release button in  center of patch.  A small green light will flash 3-4 times .  This will be your only indicator the monitor has been turned on.     Do not shower for the first 24 hours.  You may shower after the first 24 hours.   Press button if you feel a symptom. You will hear a small click.  Record Date, Time and Symptom in the Patient Log Book.   When you are ready to remove patch, follow instructions on last 2 pages of Patient Log Book.  Stick patch monitor onto last page of Patient Log Book.   Place Patient Log Book in [place] box.  Use locking tab on box and tape box closed securely.  The [employer] and [employer] has [employer] on it.  Please place in mailbox as soon as possible.  Your physician should have your test results approximately 7 days after the monitor has been mailed back to [facility].   Call [place] at [phone] if you have questions regarding your ZIO XT patch monitor.  Call them immediately if you see an orange light blinking on your monitor.   If your monitor falls off in less than 4 days contact our Monitor department at [phone].  If your monitor becomes loose or falls off after 4 days call [employer] at [phone] for suggestions on securing your monitor.

## 2020-02-16 ENCOUNTER — Ambulatory Visit (INDEPENDENT_AMBULATORY_CARE_PROVIDER_SITE_OTHER): Payer: 59

## 2020-02-16 DIAGNOSIS — R002 Palpitations: Secondary | ICD-10-CM

## 2020-02-16 DIAGNOSIS — R000 Tachycardia, unspecified: Secondary | ICD-10-CM

## 2020-02-24 ENCOUNTER — Other Ambulatory Visit: Payer: Self-pay

## 2020-02-24 ENCOUNTER — Ambulatory Visit (HOSPITAL_COMMUNITY): Payer: 59 | Attending: Cardiovascular Disease

## 2020-02-24 DIAGNOSIS — I428 Other cardiomyopathies: Secondary | ICD-10-CM | POA: Insufficient documentation

## 2020-02-24 LAB — ECHOCARDIOGRAM COMPLETE
Area-P 1/2: 2.76 cm2
S' Lateral: 4.1 cm

## 2020-04-10 ENCOUNTER — Other Ambulatory Visit: Payer: Self-pay | Admitting: Neurosurgery

## 2020-04-10 DIAGNOSIS — G959 Disease of spinal cord, unspecified: Secondary | ICD-10-CM

## 2020-04-11 ENCOUNTER — Telehealth: Payer: Self-pay | Admitting: Nurse Practitioner

## 2020-04-11 NOTE — Telephone Encounter (Signed)
Phone call to patient to verify medication list and allergies for myelogram procedure. Pt instructed to hold zoloft for 48hrs prior to myelogram appointment time. Pt verbalized understanding. Pre and post procedure instructions reviewed with pt.

## 2020-04-11 NOTE — Progress Notes (Signed)
Cardiology Office Note   Date:  04/12/2020   ID:  MARQUETT BERTOLI, DOB 1961/06/14, MRN [id number]  PCP:  Antonietta Jewel, MD  Cardiologist:   No primary care provider on file.   Chief Complaint  Patient presents with  . Shortness of Breath     History of Present Illness: Jerome Adams is a 59 y.o. 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  At the last visit he did have palpitations and had a monitor that demonstrated NSVT with the longest run being 14 beats.    He comes back today and is not really feeling well.  He had some stress and that he is taking care of her young child and he has to get that off and on the bus.  The family is staying with him.  He has not been sleeping well which is his normal.  He does feel his heart racing and skipping.  The predominant complaint however is just tiredness and sometimes presyncope.  He does have chronic shortness of breath that has been slowly progressive.  He short of breath walking 25 yards.  He has been sleeping in bed but he can elevate with 2 pillows.  This does not sound like it is an acute difference.  His weight is not significantly different.  Is not had any increased lower extremity swelling.  He thinks she has had increased abdominal girth.    Past Medical History:  Diagnosis Date  . Alcohol abuse   . Arthritis    "joints" (03/30/2013)  . CHF (congestive heart failure) ([place])   . Chronic lower back pain   . Chronic pancreatitis ([place])   . CKD (chronic kidney disease), stage II   . COPD (chronic obstructive pulmonary disease) ([place]) 07/31/2014  . Depression   . Drug abuse ([place])    a. Remote drug abuse (cocaine). UDS 07/2014 neg for cocaine.  . Essential hypertension   . GERD (gastroesophageal reflux disease)    takes  otc  . Hepatitis C 2010   a. never treated - undectable  virus 12/2013.  [name] Hyperlipidemia   . Hypertriglyceridemia   . NICM (nonischemic cardiomyopathy) ([place])    a. 12/2007 Echo: EF 45-50%;  b. 07/2014 Echo: EF 20-25%, diff HK, mod dil LA, sev dil RA, mild TR, PASP 31 mmHg.  . PUD (peptic ulcer disease)   . Sleep apnea    was tested here at [facility].Marland KitchenMarland KitchenMarland KitchenNever heard anymore about it (03/30/2013)  . Spinal cord stimulator status    has had for 1 yr--inserted by [facility]  . Tobacco abuse   . Transaminitis     Past Surgical History:  Procedure Laterality Date  . ANTERIOR CERVICAL DECOMP/DISCECTOMY FUSION  04/05/2012   Procedure: ANTERIOR CERVICAL DECOMPRESSION/DISCECTOMY FUSION 1 LEVEL;  Surgeon: Elaina Hoops, MD;  Location: [facility];  Service: Neurosurgery;  Laterality: N/A;  Cervical five-six anterior cervical decompression and fusion  . BILIARY STENT PLACEMENT  06/20/2019   Procedure: BILIARY STENT PLACEMENT;  Surgeon: Rush Landmark Telford Nab., MD;  Location: [place];  Service: Gastroenterology;;  . BILIARY STENT PLACEMENT N/A 09/21/2019   Procedure: BILIARY STENT PLACEMENT;  Surgeon: Irving Copas., MD;  Location: [facility];  Service: Gastroenterology;  Laterality: N/A;  . BIOPSY  03/31/2019   Procedure: BIOPSY;  Surgeon: Milus Banister, MD;  Location: [facility];  Service: Endoscopy;;  . BIOPSY  09/21/2019   Procedure: BIOPSY;  Surgeon: Irving Copas., MD;  Location: [facility];  Service: Gastroenterology;;  . COLONOSCOPY WITH PROPOFOL N/A 08/28/2016   Procedure: COLONOSCOPY WITH PROPOFOL;  Surgeon: Milus Banister, MD;  Location: [facility];  Service: Endoscopy;  Laterality: N/A;  . ENDOSCOPIC MUCOSAL RESECTION  06/20/2019   Procedure: ENDOSCOPIC MUCOSAL RESECTION;  Surgeon: Rush Landmark, Telford Nab., MD;  Location: [facility] ENDOSCOPY;  Service: Gastroenterology;;  . ENDOSCOPIC RETROGRADE CHOLANGIOPANCREATOGRAPHY (ERCP) WITH PROPOFOL N/A 09/21/2019   Procedure: ENDOSCOPIC RETROGRADE CHOLANGIOPANCREATOGRAPHY (ERCP) WITH  PROPOFOL;  Surgeon: Irving Copas., MD;  Location: [facility];  Service: Gastroenterology;  Laterality: N/A;  . ERCP N/A 06/20/2019   Procedure: ENDOSCOPIC RETROGRADE CHOLANGIOPANCREATOGRAPHY (ERCP);  Surgeon: Irving Copas., MD;  Location: [place];  Service: Gastroenterology;  Laterality: N/A;  . ESOPHAGOGASTRODUODENOSCOPY (EGD) WITH PROPOFOL N/A 08/28/2016   Procedure: ESOPHAGOGASTRODUODENOSCOPY (EGD) WITH PROPOFOL;  Surgeon: Milus Banister, MD;  [facility]: [facility];  Service: Endoscopy;  Laterality: N/A;  . ESOPHAGOGASTRODUODENOSCOPY (EGD) WITH PROPOFOL N/A 03/31/2019   Procedure: ESOPHAGOGASTRODUODENOSCOPY (EGD) WITH PROPOFOL;  Surgeon: Milus Banister, MD;  Location: [facility];  Service: Endoscopy;  Laterality: N/A;  . ESOPHAGOGASTRODUODENOSCOPY (EGD) WITH PROPOFOL N/A 06/20/2019   Procedure: ESOPHAGOGASTRODUODENOSCOPY (EGD) WITH PROPOFOL;  Surgeon: Rush Landmark Telford Nab., MD;  Location: [place];  Service: Gastroenterology;  Laterality: N/A;  . ESOPHAGOGASTRODUODENOSCOPY (EGD) WITH PROPOFOL N/A 09/21/2019   Procedure: ESOPHAGOGASTRODUODENOSCOPY (EGD) WITH PROPOFOL;  Surgeon: Rush Landmark Telford Nab., MD;  Location: [facility];  Service: Gastroenterology;  Laterality: N/A;  . EUS N/A 03/31/2019   Procedure: UPPER ENDOSCOPIC ULTRASOUND (EUS) RADIAL;  Surgeon: Milus Banister, MD;  Location: [facility];  Service: Endoscopy;  Laterality: N/A;  . FOOT SURGERY Right 2012?   "took piece off that was hurting me" (03/30/2013)  . HARDWARE REMOVAL     "took screw out of my back; it had damaged my nerve" (03/30/2013)--lumber  . HEMOSTASIS CLIP PLACEMENT  06/20/2019   Procedure: HEMOSTASIS CLIP PLACEMENT;  Surgeon: Irving Copas., MD;  Location: [place];  Service: Gastroenterology;;  . INCISION AND DRAINAGE ABSCESS Left    "lower jaw, tooth abscess" (03/30/2013)  . LEFT HEART CATHETERIZATION WITH CORONARY ANGIOGRAM N/A 08/03/2014   Procedure: LEFT HEART  CATHETERIZATION WITH CORONARY ANGIOGRAM;  Surgeon: Blane Ohara, MD;  Location: [facility] CATH LAB;  Service: Cardiovascular;  Laterality: N/A;  . PANCREATIC STENT PLACEMENT  06/20/2019   Procedure: PANCREATIC STENT PLACEMENT;  Surgeon: Rush Landmark Telford Nab., MD;  Location: [place];  Service: Gastroenterology;;  . PANCREATIC STENT PLACEMENT  09/21/2019   Procedure: PANCREATIC STENT PLACEMENT;  Surgeon: Irving Copas., MD;  Location: [facility];  Service: Gastroenterology;;  . POLYPECTOMY  06/20/2019   Procedure: POLYPECTOMY;  Surgeon: Irving Copas., MD;  Location: [place];  Service: Gastroenterology;;  . POSTERIOR LUMBAR FUSION     "L4-5" (03/31/2103)  . REMOVAL OF STONES  09/21/2019   Procedure: REMOVAL OF STONES;  Surgeon: Rush Landmark Telford Nab., MD;  Location: [facility];  Service: Gastroenterology;;  . Joan Mayans  06/20/2019   Procedure: Joan Mayans;  Surgeon: Irving Copas., MD;  Location: [place];  Service: Gastroenterology;;  . SPINAL CORD STIMULATOR IMPLANT  11/14/2012  . STENT REMOVAL  09/21/2019   Procedure: STENT REMOVAL;  Surgeon: Irving Copas., MD;  Location: [facility];  Service: Gastroenterology;;  . Lia Foyer LIFTING INJECTION  06/20/2019   Procedure: SUBMUCOSAL LIFTING INJECTION;  Surgeon: Irving Copas., MD;  Location:  [facility]OSCOPY;  Service: Gastroenterology;;  . UPPER ESOPHAGEAL ENDOSCOPIC ULTRASOUND (EUS) N/A 06/20/2019   Procedure: UPPER ESOPHAGEAL ENDOSCOPIC ULTRASOUND (EUS);  Surgeon: Irving Copas., MD;  Location: [place];  Service: Gastroenterology;  Laterality: N/A;  . WRIST SURGERY Right 1980's   repair of tendons and nerves     Current Outpatient Medications  Medication Sig Dispense Refill  . aspirin EC 81 MG EC tablet Take 1 tablet (81 mg total) by mouth daily. 30 tablet 0  . metoprolol succinate (TOPROL-XL) 50 MG 24 hr tablet Take 1 tablet (50 mg total) by mouth daily. Take  with or immediately following a meal. 90 tablet 3  . Multiple Vitamin (MULTIVITAMIN WITH MINERALS) TABS tablet Take 1 tablet by mouth daily.    [name] omega-3 acid ethyl esters (LOVAZA) 1 G capsule Take 1 capsule (1 g total) by mouth 2 (two) times daily. 60 capsule 5  . omeprazole (PRILOSEC) 40 MG capsule Take 1 capsule (40 mg total) by mouth 2 (two) times daily before a meal. (Patient taking differently: Take 40 mg by mouth daily. ) 60 capsule 4  . oxycodone (ROXICODONE) 30 MG immediate release tablet Take 30 mg by mouth 4 (four) times daily as needed for pain.   0  . rosuvastatin (CRESTOR) 40 MG tablet Take 40 mg by mouth daily.    . sacubitril-valsartan (ENTRESTO) 97-103 MG Take 1 tablet by mouth 2 (two) times daily. 180 tablet 3  . sertraline (ZOLOFT) 100 MG tablet Take 100 mg by mouth daily.     [name] torsemide (DEMADEX) 20 MG tablet Take 2 tablets (40 mg total) by mouth daily. 60 tablet 11  . Vitamin D, Ergocalciferol, (DRISDOL) 1.25 MG (50000 UT) CAPS capsule Take 50,000 Units by mouth every Friday.     No current facility-administered medications for this visit.    Allergies:   Hydrocodone-acetaminophen    ROS:  Please see the history of present illness.   Otherwise, review of systems are positive for left breast tenderness.   All other systems are reviewed and negative.    PHYSICAL EXAM: VS:  BP 130/84   Pulse (!) 46   Temp (!) 96.1 F (35.6 C)   Ht 5\' 9"  (1.753 m)   Wt 219 lb 3.2 oz (99.4 kg)   SpO2 97%   BMI 32.37 kg/m  , BMI Body mass index is 32.37 kg/m. GENERAL:  Well appearing NECK:  No jugular venous distention, waveform within normal limits, carotid upstroke brisk and symmetric, no bruits, no thyromegaly LUNGS:  Clear to auscultation bilaterally CHEST:  Unremarkable HEART:  PMI not displaced or sustained,S1 and S2 within normal limits, no S3, no S4, no clicks, no rubs, no murmurs ABD:  Flat, positive bowel sounds normal in frequency in pitch, no bruits, no rebound, no  guarding, no midline pulsatile mass, no hepatomegaly, no splenomegaly EXT:  2 plus pulses throughout, no edema, no cyanosis no clubbing   EKG:  EKG is ordered today. The ekg ordered today demonstrates sinus bradycardia, rate 47, first-degree AV block, inferolateral and anterolateral T wave inversions unchanged from previous.   Recent Labs: 08/05/2019: ALT 12; BUN 16; Creatinine, Ser 1.13; Hemoglobin 13.6; Platelets 223.0; Potassium 3.7; Sodium 140    Lipid Panel    Component Value Date/Time   CHOL 329 (H) 03/29/2013 1238   TRIG 410 (H) 05/26/2013 1443   HDL 21 (L) 03/29/2013 1238   CHOLHDL 15.7 03/29/2013 1238   VLDL NOT CALC 03/29/2013 1238   LDLCALC NOT CALC  03/29/2013 1238   LDLDIRECT 89 12/09/2012 1225      Wt Readings from Last 3 Encounters:  04/12/20 219 lb 3.2 oz (99.4 kg)  02/13/20 222 lb 6.4 oz (100.9 kg)  11/11/19 227 lb (103 kg)      Other studies Reviewed: Additional studies/ records that were reviewed today include: Monitor, previous EKGs. Review of the above records demonstrates:  Please see elsewhere in the note.     ASSESSMENT AND PLAN:  Nonischemic cardiomyopathy:    Unfortunately he is fatigued and having lightheadedness.  His ejection fraction continues to be around 40 to 45% and unchanged on the recent echo.   At this point I really do not think she is tolerating the meds as listed.  He has breast tenderness which is likely the spironolactone so I am going to stop this.  Is very bradycardic with first-degree AV block so I am going to need to reduce his beta-blocker.   I am going to check labs to include a BNP, CBC,  and a TSH.   He is going to work on relieving the stress in his life.  If however he is not improved when I see him back in 1 month.  I might need to do other testing such as right and left heart catheterization.   Rapid heart rate with palpitations:    Despite the evidence of some nonsustained ventricular tachycardia his predominant issue is  fatigue and he has bradycardia as above.  Therefore, I cannot uptitrate his medications I will actually go down on the beta-blocker.   Hypertriglyceridemia: His triglyceride level was over thousand.  He is able to review some blood work from his primary provider.  I will defer to their management.  Total cholesterol 280.  He is being managed with chronic pancreatitis.  Tobacco abuse: We talked again about the need to stop smoking.  Covid vaccine: He has been vaccinated.   Current medicines are reviewed at length with the patient today.  The patient does not have concerns regarding medicines.  The following changes have been made: As abovE  Labs/ tests ordered today include:   Orders Placed This Encounter  Procedures  . CBC  . TSH  . Brain natriuretic peptide  . EKG 12-Lead     Disposition:   FU with me in 1 month   Signed, Minus Breeding, MD  04/12/2020 11:04 AM    [facility]

## 2020-04-12 ENCOUNTER — Ambulatory Visit (INDEPENDENT_AMBULATORY_CARE_PROVIDER_SITE_OTHER): Payer: 59 | Admitting: Cardiology

## 2020-04-12 ENCOUNTER — Encounter: Payer: Self-pay | Admitting: Cardiology

## 2020-04-12 ENCOUNTER — Other Ambulatory Visit (HOSPITAL_COMMUNITY)
Admission: RE | Admit: 2020-04-12 | Discharge: 2020-04-12 | Disposition: A | Discharge status: Home / Self Care | Payer: 59 | Source: Ambulatory Visit | Attending: Gastroenterology | Admitting: Gastroenterology

## 2020-04-12 ENCOUNTER — Other Ambulatory Visit: Payer: Self-pay

## 2020-04-12 VITALS — BP 130/84 | HR 46 | Temp 96.1°F | Ht 69.0 in | Wt 219.2 lb

## 2020-04-12 DIAGNOSIS — R0602 Shortness of breath: Secondary | ICD-10-CM | POA: Diagnosis not present

## 2020-04-12 DIAGNOSIS — Z20822 Contact with and (suspected) exposure to covid-19: Secondary | ICD-10-CM | POA: Diagnosis not present

## 2020-04-12 DIAGNOSIS — I428 Other cardiomyopathies: Secondary | ICD-10-CM | POA: Diagnosis not present

## 2020-04-12 DIAGNOSIS — Z01812 Encounter for preprocedural laboratory examination: Secondary | ICD-10-CM | POA: Diagnosis present

## 2020-04-12 DIAGNOSIS — R5383 Other fatigue: Secondary | ICD-10-CM | POA: Diagnosis not present

## 2020-04-12 LAB — SARS CORONAVIRUS 2 (TAT 6-24 HRS): SARS Coronavirus 2: NEGATIVE

## 2020-04-12 MED ORDER — METOPROLOL SUCCINATE ER 50 MG PO TB24: 50.0000 mg | ORAL_TABLET | Freq: Every day | ORAL | 3 refills | Status: DC

## 2020-04-12 NOTE — Patient Instructions (Addendum)
Medication Instructions:  DECREASE metoprolol succinate (Toprol XL) to 50 mg daily STOP spironolactone  *If you need a refill on your cardiac medications before your next appointment, please call your pharmacy*   Lab Work: TSH, CBC, BNP today  If you have labs (blood work) drawn today and your tests are completely normal, you will receive your results only by: [name] MyChart Message (if you have MyChart) OR . A paper copy in the mail If you have any lab test that is abnormal or we need to change your treatment, we will call you to review the results.  Follow-Up: At [facility], you and your health needs are our priority.  As part of our continuing mission to provide you with exceptional heart care, we have created designated Provider Care Teams.  These Care Teams include your primary Cardiologist (physician) and Advanced Practice Providers (APPs -  Physician Assistants and Nurse Practitioners) who all work together to provide you with the care you need, when you need it.  We recommend signing up for the patient portal called "MyChart".  Sign up information is provided on this After Visit Summary.  MyChart is used to connect with patients for Virtual Visits (Telemedicine).  Patients are able to view lab/test results, encounter notes, upcoming appointments, etc.  Non-urgent messages can be sent to your provider as well.   To learn more about what you can do with MyChart, go to [email or web address].    Your next appointment:   1 month(s)  The format for your next appointment:   In Person  Provider:   Minus Breeding, MD

## 2020-04-13 ENCOUNTER — Other Ambulatory Visit: Payer: Self-pay

## 2020-04-13 ENCOUNTER — Encounter (HOSPITAL_COMMUNITY): Payer: Self-pay | Admitting: Gastroenterology

## 2020-04-13 LAB — CBC
Hematocrit: 42.4 % (ref 37.5–51.0)
Hemoglobin: 13.4 g/dL (ref 13.0–17.7)
MCH: 23.3 pg — ABNORMAL LOW (ref 26.6–33.0)
MCHC: 31.6 g/dL (ref 31.5–35.7)
MCV: 74 fL — ABNORMAL LOW (ref 79–97)
Platelets: 181 10*3/uL (ref 150–450)
RBC: 5.76 x10E6/uL (ref 4.14–5.80)
RDW: 14.8 % (ref 11.6–15.4)
WBC: 6.6 10*3/uL (ref 3.4–10.8)

## 2020-04-13 LAB — TSH: TSH: 2.05 u[IU]/mL (ref 0.450–4.500)

## 2020-04-13 LAB — BRAIN NATRIURETIC PEPTIDE: BNP: 250.8 pg/mL — ABNORMAL HIGH (ref 0.0–100.0)

## 2020-04-13 NOTE — Progress Notes (Signed)
PCP - Dr Sheryle Hail Cardiologist - Dr Loleta Chance - Dr Rush Landmark  Chest x-ray - n/a EKG - 04/12/20 Stress Test - 2016 ECHO - 02/24/20 Cardiac Cath - 08/03/14  Sleep Study - Yes CPAP - Does not use cpap  Aspirin Instructions: Follow your surgeon's instructions on when to stop aspirin prior to surgery,  If no instructions were given by your surgeon then you will need to call the office for those instructions.  Anesthesia review: Yes  STOP now taking any Aspirin (unless otherwise instructed by your surgeon), Aleve, Naproxen, Ibuprofen, Motrin, Advil, Goody's, BC's, all herbal medications, fish oil, and all vitamins.   Coronavirus Screening Covid test on 04/12/20 was negative.  Patient verbalized understanding of instructions that were given via phone.

## 2020-04-13 NOTE — Progress Notes (Signed)
Anesthesia Chart Review: SAME DAY WORK-UP (ENDO)   Case: [id number] Date/Time: 04/16/20 1115   Procedure: ENDOSCOPIC RETROGRADE CHOLANGIOPANCREATOGRAPHY (ERCP) WITH PROPOFOL (N/A )   Anesthesia type: General   Pre-op diagnosis: abnormal pancreas- stent- elevated lft   Location: [facility] ROOM [facility] / [facility]   Surgeons: Mansouraty, Telford Nab., MD      DISCUSSION: Patient is a 59 year old male scheduled for the above procedure. History of chronic pancreatitis. DX: "abnormal pancreas- stent- elevated lft". Last ERCP 09/21/19 with plastic stents placed in the ventral pancreatitic duct and in the common bile duct.   History includes smoking, HTN, non-ischemic cardiomyopathy (diagnosed 07/2014, possibly related to alcohol abuse, now sober since ~ 2016), mild CAD (30% LAD, RCA minor irregularities 09/02/83), chronic systolic CHF, chronic pancreatitis, hypertriglyceridemia, GERD, CKD stage II, COPD, OSA (does not use CPAP), hepatitis C (2010; +Ab, not detected HCV RNA felt c/w resolved past infection), former cocaine abuse (clean since 1998), GERD, chronic back pain (history of lumbar fusion; spinal cord stimulator implant 11/14/12), C5-6 ACDF (04/05/12). Last BMI is consistent with obesity.   - Last cardiology evaluation with Dr. Percival Spanish on 04/12/20. EF still 40-45% by July echo. No significant increase is weight/edema, but chronic dyspnea which he feels has progressed over time. Bradycardic in the 40's with fatigue and occasional presyncope. Occasional heart racing and skipping. Recent ZioPatch showed rare SVT with longest run of 7 beast, and NSVT longest run of 14 beats. B-blocker actually decreased due to fatigue and significant bradycardia. Spironolactone discontinued due to breast tenderness. TSH and H/H WNL. BNP result not available. One month follow-up planned. If symptoms not improved with medication adjustments then additional testing will be considered.   - Last ERCP 09/01/19: Impression: - Prior  endoscopic papillectomy with concern for recurrent/residual adenomatous tissue. - One visibly patent stent from the biliary tree was seen in the major papilla. This was removed from the biliary tree and sent for cytology. - Prior biliary sphincterotomy appeared open. - Prior pancreatic sphincterotomy appeared open. - Hot biopsy Avulsion for destruction of remaining portion of lesion in the ampulla with hot biopsy forceps was performed. - The fluroscopic examination was suspicious for sludge. - The biliary tree was swept and sludge was found. - One plastic pancreatic stent was placed into the ventral pancreatic duct. - One plastic biliary stent was placed into the common bile duct. PATHOLOGY:  COMMON BILE DUCT, AMPULLARY, BIOPSY:  - Ampullary/duodenal mucosa with gastric foveolar metaplasia and  prominent thermal artifact  - No evidence of dysplasia or malignancy in the submitted biopsy   04/12/20 preprocedure COVID-19 test negative. Anesthesia team to evaluate on the day of procedure.   VS: Ht 5\' 9"  (1.753 m)   Wt 98.9 kg   BMI 32.19 kg/m   BP Readings from Last 3 Encounters:  04/12/20 130/84  02/13/20 110/70  11/11/19 120/80   Pulse Readings from Last 3 Encounters:  04/12/20 (!) 46  02/13/20 (!) 56  11/11/19 (!) 52    PROVIDERS: Antonietta Jewel, MD is PCP  Minus Breeding, MD is cardiologist   LABS: Most recent lab results noted include: Lab Results  Component Value Date   WBC 6.6 04/12/2020   HGB 13.4 04/12/2020   HCT 42.4 04/12/2020   PLT 181 04/12/2020   GLUCOSE 112 (H) 08/05/2019   ALT 12 08/05/2019   AST 16 08/05/2019   NA 140 08/05/2019   K 3.7 08/05/2019   CL 100 08/05/2019   CREATININE 1.13 08/05/2019   BUN 16  08/05/2019   CO2 31 08/05/2019   TSH 2.050 04/12/2020    IMAGES: CT pancreas/abdomen 06/02/19: IMPRESSION: 1. 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 2. Dilated CBD (12 mm diameter). Mild diffuse intrahepatic biliary ductal dilatation. Mild ventral pancreatic duct dilation in the pancreatic head. 3. Diffuse hepatic steatosis. 4.  Aortic Atherosclerosis (ICD10-I70.0).   EKG: 04/12/20 ([facility]):  Sinus bradycardia at 47 bpm First-degree AV block Low voltage QRS T wave abnormality, consider inferior ischemia T wave abnormality, consider anterolateral ischemia Prolonged QT (QT 538 ms, QTc 476 ms)   CV: Long term cardiac monitor 02/16/20-03/01/20: Study Highlights: Underlying rhythm was sinus Occasional non sustained ventricular tachycardia with the longest run being 14 beats Rare supraventricular tachycardia with the longest run being 7 beats.     Echo 02/24/20: IMPRESSIONS  1. Global hypokinesis worse in the inferior base EF similar to echo done  on 02/16/19 . Left ventricular ejection fraction, by estimation, is 40 to  45%. The left ventricle has mildly decreased function. The left ventricle  demonstrates global hypokinesis.  The left ventricular internal cavity size was mildly dilated. There is  mild left ventricular hypertrophy. Left ventricular diastolic parameters  were normal.  2. Right ventricular systolic function is normal. The right ventricular  size is normal.  3. Left atrial size was moderately dilated.  4. Right atrial size was moderately dilated.  5. The mitral valve is normal in structure. No evidence of mitral valve  regurgitation. No evidence of mitral stenosis.  6. The aortic valve is normal in structure. Aortic valve regurgitation is  not visualized. Mild aortic valve sclerosis is present, with no evidence  of aortic valve stenosis.  7. The inferior vena cava is normal in size with greater than 50%  respiratory variability, suggesting right atrial pressure of 3 mmHg.  (Comparison: 02/16/19: LVEF 40-45%, 35-40% with global hypokinesis and severe anterior hypokinesis 10/31/16; LVEF 20-25%  diffuse hypokinesis 08/01/14)   Cardiac cath 08/03/14: - Coronary dominance: right - Left mainstem: Widely patent vessel without stenosis.  - Left anterior descending (LAD): Mild diffuse LAD/diagonal stenosis throughout the mid vessel. Approximately 30% diffuse stenosis noted.There are 2 large diagonals with mild diffuse disease but no high-grade stenosis.  - Left circumflex (LCx): Patent vessel without stenosis. Supplies 3 OM branches.  - Right coronary artery (RCA): Large, dominant vessel without significant obstruction. Minor irregularity in the mid and distal vessel. The PDA is patent.  Final Conclusions:   1. Mild nonobstructive CAD 2. Known severe LV systolic dysfunction - Recommendations: med Rx for nonobstructive CAD and nonischemic cardiomyopathy   Nuclear stress test 08/02/14: IMPRESSION: 1. No reversible ischemia or infarction. The inferior wall abnormality is most likely due to diaphragmatic attenuation artifact. 2.  Severe left ventricular diffuse hypokinesis. 3. Left ventricular ejection fraction 24% 4. High-risk stress test findings, due to severely reduced LV systolic function. The findings are more consistent with nonischemic cardiomyopathy, but cannot fully exclude "balanced" ischemia*.   Past Medical History:  Diagnosis Date  . Alcohol abuse   . Arthritis    "joints" (03/30/2013)  . CHF (congestive heart failure) ([place])   . Chronic lower back pain   . Chronic pancreatitis ([place])   . CKD (chronic kidney disease), stage II   . COPD (chronic obstructive pulmonary disease) ([place]) 07/31/2014  . Depression   . Drug abuse ([place])    a. Remote drug abuse (cocaine). UDS 07/2014  neg for cocaine.  . Essential hypertension   . GERD (gastroesophageal reflux disease)    takes  otc  . Hearing loss    decreased hearing in right ear  . Hepatitis C 2010   a. never treated - undectable virus 12/2013.  [name] Hyperlipidemia   . Hypertriglyceridemia   . NICM (nonischemic cardiomyopathy)  ([place])    a. 12/2007 Echo: EF 45-50%;  b. 07/2014 Echo: EF 20-25%, diff HK, mod dil LA, sev dil RA, mild TR, PASP 31 mmHg.  . PUD (peptic ulcer disease)   . Sleep apnea    was tested [facility] at [facility]....does not use cpap  . Spinal cord stimulator status    has had for 1 yr--inserted by [facility]  . Tobacco abuse   . Transaminitis     Past Surgical History:  Procedure Laterality Date  . ANTERIOR CERVICAL DECOMP/DISCECTOMY FUSION  04/05/2012   Procedure: ANTERIOR CERVICAL DECOMPRESSION/DISCECTOMY FUSION 1 LEVEL;  Surgeon: Elaina Hoops, MD;  Location: [facility];  Service: Neurosurgery;  Laterality: N/A;  Cervical five-six anterior cervical decompression and fusion  . BILIARY STENT PLACEMENT  06/20/2019   Procedure: BILIARY STENT PLACEMENT;  Surgeon: Rush Landmark Telford Nab., MD;  Location: [place];  Service: Gastroenterology;;  . BILIARY STENT PLACEMENT N/A 09/21/2019   Procedure: BILIARY STENT PLACEMENT;  Surgeon: Irving Copas., MD;  Location: [facility];  Service: Gastroenterology;  Laterality: N/A;  . BIOPSY  03/31/2019   Procedure: BIOPSY;  Surgeon: Milus Banister, MD;  Location: [facility];  Service: Endoscopy;;  . BIOPSY  09/21/2019   Procedure: BIOPSY;  Surgeon: Irving Copas., MD;  Location: [facility];  Service: Gastroenterology;;  . COLONOSCOPY WITH PROPOFOL N/A 08/28/2016   Procedure: COLONOSCOPY WITH PROPOFOL;  Surgeon: Milus Banister, MD;  Location: [facility];  Service: Endoscopy;  Laterality: N/A;  . ENDOSCOPIC MUCOSAL RESECTION  06/20/2019   Procedure: ENDOSCOPIC MUCOSAL RESECTION;  Surgeon: Rush Landmark, Telford Nab., MD;  Location: [facility] ENDOSCOPY;  Service: Gastroenterology;;  . ENDOSCOPIC RETROGRADE CHOLANGIOPANCREATOGRAPHY (ERCP) WITH PROPOFOL N/A 09/21/2019   Procedure: ENDOSCOPIC RETROGRADE CHOLANGIOPANCREATOGRAPHY (ERCP) WITH PROPOFOL;  Surgeon: Irving Copas., MD;  Location: [facility];  Service: Gastroenterology;  Laterality: N/A;  . ERCP  N/A 06/20/2019   Procedure: ENDOSCOPIC RETROGRADE CHOLANGIOPANCREATOGRAPHY (ERCP);  Surgeon: Irving Copas., MD;  Location: [place];  Service: Gastroenterology;  Laterality: N/A;  . ESOPHAGOGASTRODUODENOSCOPY (EGD) WITH PROPOFOL N/A 08/28/2016   Procedure: ESOPHAGOGASTRODUODENOSCOPY (EGD) WITH PROPOFOL;  Surgeon: Milus Banister, MD;  [facility];  Service: Endoscopy;  Laterality: N/A;  . ESOPHAGOGASTRODUODENOSCOPY (EGD) WITH PROPOFOL N/A 03/31/2019   Procedure: ESOPHAGOGASTRODUODENOSCOPY (EGD) WITH PROPOFOL;  Surgeon: Milus Banister, MD;  Location: [facility];  Service: Endoscopy;  Laterality: N/A;  . ESOPHAGOGASTRODUODENOSCOPY (EGD) WITH PROPOFOL N/A 06/20/2019   Procedure: ESOPHAGOGASTRODUODENOSCOPY (EGD) WITH PROPOFOL;  Surgeon: Rush Landmark Telford Nab., MD;  Location: [place];  Service: Gastroenterology;  Laterality: N/A;  . ESOPHAGOGASTRODUODENOSCOPY (EGD) WITH PROPOFOL N/A 09/21/2019   Procedure: ESOPHAGOGASTRODUODENOSCOPY (EGD) WITH PROPOFOL;  Surgeon: Rush Landmark Telford Nab., MD;  Location[facility];  Service: Gastroenterology;  Laterality: N/A;  . EUS N/A 03/31/2019   Procedure: UPPER ENDOSCOPIC ULTRASOUND (EUS) RADIAL;  Surgeon: Milus Banister, MD;  Location: [facility];  Service: Endoscopy;  Laterality: N/A;  . FOOT SURGERY Right 2012?   "took piece off that was hurting me" (03/30/2013)  . HARDWARE REMOVAL     "took screw out of my back; it had damaged my nerve" (03/30/2013)--lumber  . HEMOSTASIS CLIP PLACEMENT  06/20/2019   Procedure: HEMOSTASIS CLIP PLACEMENT;  Surgeon: Irving Copas., MD;  Location: [place];  Service: Gastroenterology;;  . INCISION AND DRAINAGE ABSCESS Left    "lower jaw, tooth abscess" (03/30/2013)  . LEFT HEART CATHETERIZATION WITH CORONARY ANGIOGRAM N/A 08/03/2014   Procedure: LEFT HEART CATHETERIZATION WITH CORONARY ANGIOGRAM;  Surgeon: Blane Ohara, MD;  Location: [facility] CATH LAB;  Service: Cardiovascular;  Laterality:  N/A;  . PANCREATIC STENT PLACEMENT  06/20/2019   Procedure: PANCREATIC STENT PLACEMENT;  Surgeon: Rush Landmark Telford Nab., MD;  Location: [place];  Service: Gastroenterology;;  . PANCREATIC STENT PLACEMENT  09/21/2019   Procedure: PANCREATIC STENT PLACEMENT;  Surgeon: Irving Copas., MD;  Location: [facility];  Service: Gastroenterology;;  . POLYPECTOMY  06/20/2019   Procedure: POLYPECTOMY;  Surgeon: Irving Copas., MD;  Location: [place];  Service: Gastroenterology;;  . POSTERIOR LUMBAR FUSION     "L4-5" (03/31/2103)  . REMOVAL OF STONES  09/21/2019   Procedure: REMOVAL OF STONES;  Surgeon: Rush Landmark Telford Nab., MD;  Location: [facility];  Service: Gastroenterology;;  . Joan Mayans  06/20/2019   Procedure: Joan Mayans;  Surgeon: Irving Copas., MD;  Location: [place];  Service: Gastroenterology;;  . SPINAL CORD STIMULATOR IMPLANT  11/14/2012  . STENT REMOVAL  09/21/2019   Procedure: STENT REMOVAL;  Surgeon: Irving Copas., MD;  Location: [facility];  Service: Gastroenterology;;  . Lia Foyer LIFTING INJECTION  06/20/2019   Procedure: SUBMUCOSAL LIFTING INJECTION;  Surgeon: Irving Copas., MD;  Location: [place];  Service: Gastroenterology;;  . UPPER ESOPHAGEAL ENDOSCOPIC ULTRASOUND (EUS) N/A 06/20/2019   Procedure: UPPER ESOPHAGEAL ENDOSCOPIC ULTRASOUND (EUS);  Surgeon: Irving Copas., MD;  Location: [place];  Service: Gastroenterology;  Laterality: N/A;  . WRIST SURGERY Right 1980's   repair of tendons and nerves    MEDICATIONS: No current facility-administered medications for this encounter.   [name] aspirin EC 81 MG EC tablet  . Multiple Vitamin (MULTIVITAMIN WITH MINERALS) TABS tablet  . omega-3 acid ethyl esters (LOVAZA) 1 G capsule  . omeprazole (PRILOSEC) 40 MG capsule  . oxycodone (ROXICODONE) 30 MG immediate release tablet  . rosuvastatin (CRESTOR) 40 MG tablet  . sacubitril-valsartan  (ENTRESTO) 97-103 MG  . sertraline (ZOLOFT) 100 MG tablet  . torsemide (DEMADEX) 20 MG tablet  . Vitamin D, Ergocalciferol, (DRISDOL) 1.25 MG (50000 UT) CAPS capsule  . metoprolol succinate (TOPROL-XL) 50 MG 24 hr tablet    Myra Gianotti, PA-C Surgical Short Stay/Anesthesiology [facility] Phone [phone] [facility] Phone [phone] 04/13/2020 12:22 PM

## 2020-04-13 NOTE — Anesthesia Preprocedure Evaluation (Addendum)
Anesthesia Evaluation  Patient identified by MRN, date of birth, ID band  Reviewed: Patient's Chart, lab work & pertinent test results, reviewed documented beta blocker date and time   Airway Mallampati: II  TM Distance: >3 FB Neck ROM: Full    Dental  (+) Caps   Pulmonary sleep apnea , COPD, Current Smoker and Patient abstained from smoking.,  No CPAP   Pulmonary exam normal        Cardiovascular hypertension, + CAD and +CHF   Rhythm:Regular Rate:Normal  Long term cardiac monitor 02/16/20-03/01/20: Underlying rhythm was sinus Occasional non sustained ventricular tachycardia with the longest run being 14 beats Rare supraventricular tachycardia with the longest run being 7 beats.    Echo 02/24/20: 1. Global hypokinesis worse in the inferior base EF similar to echo done  on 02/16/19 . Left ventricular ejection fraction, by estimation, is 40 to  45%. The left ventricle has mildly decreased function. The left ventricle  demonstrates global hypokinesis.  The left ventricular internal cavity size was mildly dilated. There is  mild left ventricular hypertrophy. Left ventricular diastolic parameters  were normal.  2. Right ventricular systolic function is normal. The right ventricular  size is normal.  3. Left atrial size was moderately dilated.  4. Right atrial size was moderately dilated.  5. The mitral valve is normal in structure. No evidence of mitral valve  regurgitation. No evidence of mitral stenosis.  6. The aortic valve is normal in structure. Aortic valve regurgitation is  not visualized. Mild aortic valve sclerosis is present, with no evidence  of aortic valve stenosis.  7. The inferior vena cava is normal in size with greater than 50%  respiratory variability, suggesting right atrial pressure of 3 mmHg.    Cardiac cath 08/03/14: 1. Mild nonobstructive CAD 2. Known severe LV systolic  dysfunction Recommendations:med Rx for nonobstructive CAD and nonischemic cardiomyopathy    Neuro/Psych Depression [place] stimulator in situ  Neuromuscular disease    GI/Hepatic PUD, GERD  Medicated,(+) Hepatitis -, CNever treated, undetected 06/15 Chronic pancreatitis   Endo/Other    Renal/GU CRFRenal disease     Musculoskeletal  (+) Arthritis , Osteoarthritis,  C5/C6 ACDF 2013   Abdominal Normal abdominal exam  (+)  Abdomen: soft. Bowel sounds: normal.  Peds negative pediatric ROS (+)  Hematology   Anesthesia Other Findings   Reproductive/Obstetrics                           Anesthesia Physical Anesthesia Plan  ASA: III  Anesthesia Plan: General   Post-op Pain Management:    Induction: Intravenous  PONV Risk Score and Plan: 1 and Ondansetron and Dexamethasone  Airway Management Planned: Mask and Oral ETT  Additional Equipment: None  Intra-op Plan:   Post-operative Plan: Extubation in OR  Informed Consent: I have reviewed the patients History and Physical, chart, labs and discussed the procedure including the risks, benefits and alternatives for the proposed anesthesia with the patient or authorized representative who has indicated his/her understanding and acceptance.       Plan Discussed with: CRNA and Surgeon  Anesthesia Plan Comments: (PAT note written 04/13/2020 by Myra Gianotti, PA-C. Lab Results      Component                Value               Date  WBC                      6.6                 04/12/2020                HGB                      13.4                04/12/2020                HCT                      42.4                04/12/2020                MCV                      74 (L)              04/12/2020                PLT                      181                 04/12/2020           Covid-19 Nucleic Acid Test Results Lab Results      Component                Value               Date                       SARSCOV2NAA              NEGATIVE            04/12/2020                SARSCOV2NAA              NEGATIVE            09/17/2019                SARSCOV2NAA              NOT DETECTED        06/16/2019                [place]              NEGATIVE            03/28/2019            ECHO 07/21: 1. Global hypokinesis worse in the inferior base EF similar to echo done on 02/16/19 . Left ventricular ejection fraction, by estimation, is 40 to 45%. The left ventricle has mildly decreased function. The left ventricle demonstrates global hypokinesis. The left ventricular internal cavity size was mildly dilated. There is mild left ventricular hypertrophy. Left ventricular diastolic parameters were normal. 2. Right ventricular systolic function is normal. The right ventricular size is normal. 3. Left atrial size was moderately dilated. 4. Right atrial size was moderately dilated. 5. The mitral valve is normal in structure. No evidence of mitral valve regurgitation. No evidence of mitral  stenosis. 6. The aortic valve is normal in structure. Aortic valve regurgitation is not visualized. Mild aortic valve sclerosis is present, with no evidence of aortic valve stenosis. 7. The inferior vena cava is normal in size with greater than 50% respiratory variability, suggesting right atrial pressure of 3 mmHg.)       Anesthesia Quick Evaluation

## 2020-04-16 ENCOUNTER — Ambulatory Visit (HOSPITAL_COMMUNITY): Payer: 59

## 2020-04-16 ENCOUNTER — Other Ambulatory Visit: Payer: Self-pay

## 2020-04-16 ENCOUNTER — Ambulatory Visit (HOSPITAL_COMMUNITY)
Admission: RE | Admit: 2020-04-16 | Discharge: 2020-04-16 | Disposition: A | Discharge status: Home / Self Care | Payer: 59 | Attending: Gastroenterology | Admitting: Gastroenterology

## 2020-04-16 ENCOUNTER — Other Ambulatory Visit: Payer: Self-pay | Admitting: Physician Assistant

## 2020-04-16 ENCOUNTER — Encounter (HOSPITAL_COMMUNITY)
Admission: RE | Disposition: A | Discharge status: Home / Self Care | Payer: Self-pay | Source: Home / Self Care | Attending: Gastroenterology

## 2020-04-16 ENCOUNTER — Encounter (HOSPITAL_COMMUNITY): Payer: Self-pay | Admitting: Gastroenterology

## 2020-04-16 ENCOUNTER — Ambulatory Visit (HOSPITAL_COMMUNITY): Payer: 59 | Admitting: Vascular Surgery

## 2020-04-16 DIAGNOSIS — D490 Neoplasm of unspecified behavior of digestive system: Secondary | ICD-10-CM | POA: Diagnosis not present

## 2020-04-16 DIAGNOSIS — K760 Fatty (change of) liver, not elsewhere classified: Secondary | ICD-10-CM | POA: Insufficient documentation

## 2020-04-16 DIAGNOSIS — F329 Major depressive disorder, single episode, unspecified: Secondary | ICD-10-CM | POA: Insufficient documentation

## 2020-04-16 DIAGNOSIS — Z8711 Personal history of peptic ulcer disease: Secondary | ICD-10-CM | POA: Diagnosis not present

## 2020-04-16 DIAGNOSIS — F17210 Nicotine dependence, cigarettes, uncomplicated: Secondary | ICD-10-CM | POA: Diagnosis not present

## 2020-04-16 DIAGNOSIS — K2950 Unspecified chronic gastritis without bleeding: Secondary | ICD-10-CM | POA: Diagnosis not present

## 2020-04-16 DIAGNOSIS — M545 Low back pain: Secondary | ICD-10-CM | POA: Diagnosis not present

## 2020-04-16 DIAGNOSIS — Z8249 Family history of ischemic heart disease and other diseases of the circulatory system: Secondary | ICD-10-CM | POA: Insufficient documentation

## 2020-04-16 DIAGNOSIS — H9191 Unspecified hearing loss, right ear: Secondary | ICD-10-CM | POA: Insufficient documentation

## 2020-04-16 DIAGNOSIS — R7989 Other specified abnormal findings of blood chemistry: Secondary | ICD-10-CM

## 2020-04-16 DIAGNOSIS — R945 Abnormal results of liver function studies: Secondary | ICD-10-CM

## 2020-04-16 DIAGNOSIS — I700 Atherosclerosis of aorta: Secondary | ICD-10-CM | POA: Insufficient documentation

## 2020-04-16 DIAGNOSIS — E781 Pure hyperglyceridemia: Secondary | ICD-10-CM | POA: Insufficient documentation

## 2020-04-16 DIAGNOSIS — G8929 Other chronic pain: Secondary | ICD-10-CM | POA: Diagnosis not present

## 2020-04-16 DIAGNOSIS — J449 Chronic obstructive pulmonary disease, unspecified: Secondary | ICD-10-CM | POA: Insufficient documentation

## 2020-04-16 DIAGNOSIS — K219 Gastro-esophageal reflux disease without esophagitis: Secondary | ICD-10-CM | POA: Insufficient documentation

## 2020-04-16 DIAGNOSIS — G4730 Sleep apnea, unspecified: Secondary | ICD-10-CM | POA: Insufficient documentation

## 2020-04-16 DIAGNOSIS — I2510 Atherosclerotic heart disease of native coronary artery without angina pectoris: Secondary | ICD-10-CM | POA: Insufficient documentation

## 2020-04-16 DIAGNOSIS — E785 Hyperlipidemia, unspecified: Secondary | ICD-10-CM | POA: Insufficient documentation

## 2020-04-16 DIAGNOSIS — Z885 Allergy status to narcotic agent status: Secondary | ICD-10-CM | POA: Insufficient documentation

## 2020-04-16 DIAGNOSIS — Z4659 Encounter for fitting and adjustment of other gastrointestinal appliance and device: Secondary | ICD-10-CM

## 2020-04-16 DIAGNOSIS — M1990 Unspecified osteoarthritis, unspecified site: Secondary | ICD-10-CM | POA: Insufficient documentation

## 2020-04-16 DIAGNOSIS — Z79899 Other long term (current) drug therapy: Secondary | ICD-10-CM | POA: Insufficient documentation

## 2020-04-16 DIAGNOSIS — K861 Other chronic pancreatitis: Secondary | ICD-10-CM | POA: Diagnosis not present

## 2020-04-16 DIAGNOSIS — I509 Heart failure, unspecified: Secondary | ICD-10-CM | POA: Diagnosis not present

## 2020-04-16 DIAGNOSIS — I130 Hypertensive heart and chronic kidney disease with heart failure and stage 1 through stage 4 chronic kidney disease, or unspecified chronic kidney disease: Secondary | ICD-10-CM | POA: Diagnosis not present

## 2020-04-16 DIAGNOSIS — I428 Other cardiomyopathies: Secondary | ICD-10-CM | POA: Diagnosis not present

## 2020-04-16 DIAGNOSIS — K3189 Other diseases of stomach and duodenum: Secondary | ICD-10-CM | POA: Diagnosis not present

## 2020-04-16 DIAGNOSIS — N182 Chronic kidney disease, stage 2 (mild): Secondary | ICD-10-CM | POA: Insufficient documentation

## 2020-04-16 DIAGNOSIS — Q453 Other congenital malformations of pancreas and pancreatic duct: Secondary | ICD-10-CM

## 2020-04-16 DIAGNOSIS — K838 Other specified diseases of biliary tract: Secondary | ICD-10-CM

## 2020-04-16 DIAGNOSIS — K831 Obstruction of bile duct: Secondary | ICD-10-CM | POA: Insufficient documentation

## 2020-04-16 DIAGNOSIS — D135 Benign neoplasm of extrahepatic bile ducts: Secondary | ICD-10-CM | POA: Insufficient documentation

## 2020-04-16 DIAGNOSIS — Z981 Arthrodesis status: Secondary | ICD-10-CM | POA: Insufficient documentation

## 2020-04-16 DIAGNOSIS — Z8619 Personal history of other infectious and parasitic diseases: Secondary | ICD-10-CM | POA: Diagnosis not present

## 2020-04-16 DIAGNOSIS — Z4589 Encounter for adjustment and management of other implanted devices: Secondary | ICD-10-CM | POA: Diagnosis present

## 2020-04-16 DIAGNOSIS — Z7982 Long term (current) use of aspirin: Secondary | ICD-10-CM | POA: Insufficient documentation

## 2020-04-16 DIAGNOSIS — I471 Supraventricular tachycardia: Secondary | ICD-10-CM | POA: Insufficient documentation

## 2020-04-16 DIAGNOSIS — Z803 Family history of malignant neoplasm of breast: Secondary | ICD-10-CM | POA: Insufficient documentation

## 2020-04-16 HISTORY — PX: REMOVAL OF STONES: SHX5545

## 2020-04-16 HISTORY — PX: BILIARY DILATION: SHX6850

## 2020-04-16 HISTORY — PX: ENDOSCOPIC RETROGRADE CHOLANGIOPANCREATOGRAPHY (ERCP) WITH PROPOFOL: SHX5810

## 2020-04-16 HISTORY — DX: Unspecified hearing loss, unspecified ear: H91.90

## 2020-04-16 HISTORY — PX: BIOPSY: SHX5522

## 2020-04-16 HISTORY — PX: STENT REMOVAL: SHX6421

## 2020-04-16 SURGERY — ENDOSCOPIC RETROGRADE CHOLANGIOPANCREATOGRAPHY (ERCP) WITH PROPOFOL
Anesthesia: General

## 2020-04-16 MED ORDER — GLUCAGON HCL RDNA (DIAGNOSTIC) 1 MG IJ SOLR
INTRAMUSCULAR | Status: DC | PRN
  Administered 2020-04-16 (×3): .25 mg via INTRAVENOUS

## 2020-04-16 MED ORDER — DEXAMETHASONE SODIUM PHOSPHATE 10 MG/ML IJ SOLN
INTRAMUSCULAR | Status: DC | PRN
  Administered 2020-04-16: 4 mg via INTRAVENOUS

## 2020-04-16 MED ORDER — FENTANYL CITRATE (PF) 250 MCG/5ML IJ SOLN
INTRAMUSCULAR | Status: DC | PRN
Start: 2020-04-16 — End: 2020-04-16
  Administered 2020-04-16 (×2): 50 ug via INTRAVENOUS

## 2020-04-16 MED ORDER — PHENYLEPHRINE 40 MCG/ML (10ML) SYRINGE FOR IV PUSH (FOR BLOOD PRESSURE SUPPORT)
PREFILLED_SYRINGE | INTRAVENOUS | Status: DC | PRN
  Administered 2020-04-16: 80 ug via INTRAVENOUS

## 2020-04-16 MED ORDER — FENTANYL CITRATE (PF) 100 MCG/2ML IJ SOLN
INTRAMUSCULAR | Status: AC
  Filled 2020-04-16: qty 2

## 2020-04-16 MED ORDER — ONDANSETRON HCL 4 MG/2ML IJ SOLN: 4.0000 mg | Freq: Once | INTRAMUSCULAR | Status: DC | PRN

## 2020-04-16 MED ORDER — CIPROFLOXACIN IN D5W 400 MG/200ML IV SOLN
INTRAVENOUS | Status: AC
  Filled 2020-04-16: qty 200

## 2020-04-16 MED ORDER — LIDOCAINE 2% (20 MG/ML) 5 ML SYRINGE
INTRAMUSCULAR | Status: DC | PRN
  Administered 2020-04-16: 80 mg via INTRAVENOUS

## 2020-04-16 MED ORDER — ACETAMINOPHEN 160 MG/5ML PO SOLN: 325.0000 mg | ORAL | Status: DC | PRN

## 2020-04-16 MED ORDER — ROCURONIUM BROMIDE 10 MG/ML (PF) SYRINGE
PREFILLED_SYRINGE | INTRAVENOUS | Status: DC | PRN
  Administered 2020-04-16: 50 mg via INTRAVENOUS

## 2020-04-16 MED ORDER — PROPOFOL 10 MG/ML IV BOLUS
INTRAVENOUS | Status: DC | PRN
  Administered 2020-04-16: 120 mg via INTRAVENOUS
  Administered 2020-04-16: 30 mg via INTRAVENOUS

## 2020-04-16 MED ORDER — INDOMETHACIN 50 MG RE SUPP
RECTAL | Status: AC
  Filled 2020-04-16: qty 2

## 2020-04-16 MED ORDER — ACETAMINOPHEN 325 MG PO TABS: 325.0000 mg | ORAL_TABLET | ORAL | Status: DC | PRN

## 2020-04-16 MED ORDER — SODIUM CHLORIDE 0.9 % IV SOLN
INTRAVENOUS | Status: DC | PRN
  Administered 2020-04-16: 20 mL

## 2020-04-16 MED ORDER — GLUCAGON HCL RDNA (DIAGNOSTIC) 1 MG IJ SOLR
INTRAMUSCULAR | Status: AC
  Filled 2020-04-16: qty 2

## 2020-04-16 MED ORDER — INDOMETHACIN 50 MG RE SUPP
RECTAL | Status: DC | PRN
  Administered 2020-04-16: 100 mg via RECTAL

## 2020-04-16 MED ORDER — HYDROMORPHONE HCL 1 MG/ML IJ SOLN: 0.2500 mg | INTRAMUSCULAR | Status: DC | PRN

## 2020-04-16 MED ORDER — SODIUM CHLORIDE 0.9 % IV SOLN: INTRAVENOUS | Status: DC

## 2020-04-16 MED ORDER — ONDANSETRON HCL 4 MG/2ML IJ SOLN
INTRAMUSCULAR | Status: DC | PRN
  Administered 2020-04-16: 4 mg via INTRAVENOUS

## 2020-04-16 MED ORDER — SUGAMMADEX SODIUM 200 MG/2ML IV SOLN
INTRAVENOUS | Status: DC | PRN
  Administered 2020-04-16: 200 mg via INTRAVENOUS

## 2020-04-16 MED ORDER — PHENYLEPHRINE HCL-NACL 10-0.9 MG/250ML-% IV SOLN
INTRAVENOUS | Status: DC | PRN
  Administered 2020-04-16: 40 ug/min via INTRAVENOUS

## 2020-04-16 MED ORDER — LACTATED RINGERS IV SOLN: INTRAVENOUS | Status: DC | PRN

## 2020-04-16 MED ORDER — EPHEDRINE SULFATE-NACL 50-0.9 MG/10ML-% IV SOSY
PREFILLED_SYRINGE | INTRAVENOUS | Status: DC | PRN
  Administered 2020-04-16: 5 mg via INTRAVENOUS

## 2020-04-16 NOTE — Discharge Instructions (Signed)
YOU HAD AN ENDOSCOPIC PROCEDURE TODAY: Refer to the procedure report and other information in the discharge instructions given to you for any specific questions about what was found during the examination. If this information does not answer your questions, please call [place] office at [phone] to clarify.  ° °YOU SHOULD EXPECT: Some feelings of bloating in the abdomen. Passage of more gas than usual. Walking can help get rid of the air that was put into your GI tract during the procedure and reduce the bloating.  ° °DIET: Your first meal following the procedure should be a light meal and then it is ok to progress to your normal diet. A half-sandwich or bowl of soup is an example of a good first meal. Heavy or fried foods are harder to digest and may make you feel nauseous or bloated. Drink plenty of fluids but you should avoid alcoholic beverages for 24 hours. ° °ACTIVITY: Your care partner should take you home directly after the procedure. You should plan to take it easy, moving slowly for the rest of the day. You can resume normal activity the day after the procedure however YOU SHOULD NOT DRIVE, use power tools, machinery or perform tasks that involve climbing or major physical exertion for 24 hours (because of the sedation medicines used during the test).  ° °SYMPTOMS TO REPORT IMMEDIATELY: °A gastroenterologist can be reached at any hour. Please call [phone]  for any of the following symptoms:  ° °Following upper endoscopy (EGD, EUS, ERCP, esophageal dilation) °Vomiting of blood or coffee ground material  °New, significant abdominal pain  °New, significant chest pain or pain under the shoulder blades  °Painful or persistently difficult swallowing  °New shortness of breath  °Black, tarry-looking or red, bloody stools ° °FOLLOW UP:  °If any biopsies were taken you will be contacted by phone or by letter within the next 1-3 weeks. Call [phone]  if you have not heard about the biopsies in 3 weeks.    °Please also call with any specific questions about appointments or follow up tests. ° °

## 2020-04-16 NOTE — H&P (Signed)
GASTROENTEROLOGY PROCEDURE H&P NOTE   Primary Care Physician: Antonietta Jewel, MD  HPI: Jerome Adams is a 59 y.o. male who presents for ERCP with evaluation s/p Ampullectomy in 2020.  Past Medical History:  Diagnosis Date  . Alcohol abuse   . Arthritis    "joints" (03/30/2013)  . CHF (congestive heart failure) ([place])   . Chronic lower back pain   . Chronic pancreatitis ([place])   . CKD (chronic kidney disease), stage II   . COPD (chronic obstructive pulmonary disease) ([place]) 07/31/2014  . Depression   . Drug abuse ([place])    a. Remote drug abuse (cocaine). UDS 07/2014 neg for cocaine.  . Essential hypertension   . GERD (gastroesophageal reflux disease)    takes  otc  . Hearing loss    decreased hearing in right ear  . Hepatitis C 2010   a. never treated - undectable virus 12/2013.  [name] Hyperlipidemia   . Hypertriglyceridemia   . NICM (nonischemic cardiomyopathy) ([place])    a. 12/2007 Echo: EF 45-50%;  b. 07/2014 Echo: EF 20-25%, diff HK, mod dil LA, sev dil RA, mild TR, PASP 31 mmHg.  . PUD (peptic ulcer disease)   . Sleep apnea    was tested [facility] at [facility]....does not use cpap  . Spinal cord stimulator status    has had for 1 yr--inserted by [facility]  . Tobacco abuse   . Transaminitis    Past Surgical History:  Procedure Laterality Date  . ANTERIOR CERVICAL DECOMP/DISCECTOMY FUSION  04/05/2012   Procedure: ANTERIOR CERVICAL DECOMPRESSION/DISCECTOMY FUSION 1 LEVEL;  Surgeon: Elaina Hoops, MD;  Location: [place] [facility] ORS;  Service: Neurosurgery;  Laterality: N/A;  Cervical five-six anterior cervical decompression and fusion  . BILIARY STENT PLACEMENT  06/20/2019   Procedure: BILIARY STENT PLACEMENT;  Surgeon: Rush Landmark Telford Nab., MD;  Location: [place];  Service: Gastroenterology;;  . BILIARY STENT PLACEMENT N/A 09/21/2019   Procedure: BILIARY STENT PLACEMENT;  Surgeon: Irving Copas., MD;  Location: [facility];  Service: Gastroenterology;  Laterality: N/A;  . BIOPSY   03/31/2019   Procedure: BIOPSY;  Surgeon: Milus Banister, MD;  Location: [facility];  Service: Endoscopy;;  . BIOPSY  09/21/2019   Procedure: BIOPSY;  Surgeon: Irving Copas., MD;  Location: [facility];  Service: Gastroenterology;;  . COLONOSCOPY WITH PROPOFOL N/A 08/28/2016   Procedure: COLONOSCOPY WITH PROPOFOL;  Surgeon: Milus Banister, MD;  Location: [facility];  Service: Endoscopy;  Laterality: N/A;  . ENDOSCOPIC MUCOSAL RESECTION  06/20/2019   Procedure: ENDOSCOPIC MUCOSAL RESECTION;  Surgeon: Rush Landmark, Telford Nab., MD;  Location: [facility] ENDOSCOPY;  Service: Gastroenterology;;  . ENDOSCOPIC RETROGRADE CHOLANGIOPANCREATOGRAPHY (ERCP) WITH PROPOFOL N/A 09/21/2019   Procedure: ENDOSCOPIC RETROGRADE CHOLANGIOPANCREATOGRAPHY (ERCP) WITH PROPOFOL;  Surgeon: Irving Copas., MD;  [facility];  Service: Gastroenterology;  Laterality: N/A;  . ERCP N/A 06/20/2019   Procedure: ENDOSCOPIC RETROGRADE CHOLANGIOPANCREATOGRAPHY (ERCP);  Surgeon: Irving Copas., MD;  Location: [place];  Service: Gastroenterology;  Laterality: N/A;  . ESOPHAGOGASTRODUODENOSCOPY (EGD) WITH PROPOFOL N/A 08/28/2016   Procedure: ESOPHAGOGASTRODUODENOSCOPY (EGD) WITH PROPOFOL;  Surgeon: Milus Banister, MD;  [facility];  Service: Endoscopy;  Laterality: N/A;  . ESOPHAGOGASTRODUODENOSCOPY (EGD) WITH PROPOFOL N/A 03/31/2019   Procedure: ESOPHAGOGASTRODUODENOSCOPY (EGD) WITH PROPOFOL;  Surgeon: Milus Banister, MD;  [facility]: [facility];  Service: Endoscopy;  Laterality: N/A;  . ESOPHAGOGASTRODUODENOSCOPY (EGD) WITH PROPOFOL N/A 06/20/2019   Procedure: ESOPHAGOGASTRODUODENOSCOPY (EGD) WITH PROPOFOL;  Surgeon: Rush Landmark Telford Nab., MD;  Location:  [place] ENDOSCOPY;  Service: Gastroenterology;  Laterality: N/A;  . ESOPHAGOGASTRODUODENOSCOPY (EGD) WITH PROPOFOL N/A 09/21/2019   Procedure: ESOPHAGOGASTRODUODENOSCOPY (EGD) WITH PROPOFOL;  Surgeon: Rush Landmark Telford Nab., MD;  Location:  [facility];  Service: Gastroenterology;  Laterality: N/A;  . EUS N/A 03/31/2019   Procedure: UPPER ENDOSCOPIC ULTRASOUND (EUS) RADIAL;  Surgeon: Milus Banister, MD;  Location: [facility];  Service: Endoscopy;  Laterality: N/A;  . FOOT SURGERY Right 2012?   "took piece off that was hurting me" (03/30/2013)  . HARDWARE REMOVAL     "took screw out of my back; it had damaged my nerve" (03/30/2013)--lumber  . HEMOSTASIS CLIP PLACEMENT  06/20/2019   Procedure: HEMOSTASIS CLIP PLACEMENT;  Surgeon: Irving Copas., MD;  Location: [place];  Service: Gastroenterology;;  . INCISION AND DRAINAGE ABSCESS Left    "lower jaw, tooth abscess" (03/30/2013)  . LEFT HEART CATHETERIZATION WITH CORONARY ANGIOGRAM N/A 08/03/2014   Procedure: LEFT HEART CATHETERIZATION WITH CORONARY ANGIOGRAM;  Surgeon: Blane Ohara, MD;  Location: [facility] CATH LAB;  Service: Cardiovascular;  Laterality: N/A;  . PANCREATIC STENT PLACEMENT  06/20/2019   Procedure: PANCREATIC STENT PLACEMENT;  Surgeon: Rush Landmark Telford Nab., MD;  Location: [place];  Service: Gastroenterology;;  . PANCREATIC STENT PLACEMENT  09/21/2019   Procedure: PANCREATIC STENT PLACEMENT;  Surgeon: Irving Copas., MD;  Location: [facility];  Service: Gastroenterology;;  . POLYPECTOMY  06/20/2019   Procedure: POLYPECTOMY;  Surgeon: Irving Copas., MD;  Location: [place];  Service: Gastroenterology;;  . POSTERIOR LUMBAR FUSION     "L4-5" (03/31/2103)  . REMOVAL OF STONES  09/21/2019   Procedure: REMOVAL OF STONES;  Surgeon: Rush Landmark Telford Nab., MD;  Location: [facility];  Service: Gastroenterology;;  . Joan Mayans  06/20/2019   Procedure: Joan Mayans;  Surgeon: Irving Copas., MD;  Location: [place];  Service: Gastroenterology;;  . SPINAL CORD STIMULATOR IMPLANT  11/14/2012  . STENT REMOVAL  09/21/2019   Procedure: STENT REMOVAL;  Surgeon: Irving Copas., MD;  Location: [facility];  Service:  Gastroenterology;;  . Lia Foyer LIFTING INJECTION  06/20/2019   Procedure: SUBMUCOSAL LIFTING INJECTION;  Surgeon: Irving Copas., MD;  Location: [place];  Service: Gastroenterology;;  . UPPER ESOPHAGEAL ENDOSCOPIC ULTRASOUND (EUS) N/A 06/20/2019   Procedure: UPPER ESOPHAGEAL ENDOSCOPIC ULTRASOUND (EUS);  Surgeon: Irving Copas., MD;  Location: [place];  Service: Gastroenterology;  Laterality: N/A;  . WRIST SURGERY Right 1980's   repair of tendons and nerves   Current Facility-Administered Medications  Medication Dose Route Frequency Provider Last Rate Last Admin  . 0.9 %  sodium chloride infusion   Intravenous Continuous Mansouraty, Telford Nab., MD       Allergies  Allergen Reactions  . Hydrocodone-Acetaminophen Itching   Family History  Problem Relation Age of Onset  . Heart disease Father        H/o MI followed by SCD @ 41.  [name] Heart disease Sister        Died suddenly @ 22 - h/o CHF s/p AICD.  [name] Breast cancer Sister   . Bone cancer Mother   . Esophageal cancer Neg Hx   . Stomach cancer Neg Hx   . Colon cancer Neg Hx   . Inflammatory bowel disease Neg Hx   . Liver disease Neg Hx   . Pancreatic cancer Neg Hx   . Rectal cancer Neg Hx    Social History   Socioeconomic History  . Marital status: Divorced    Spouse name: Not on file  .  Number of children: 1  . Years of education: Not on file  . Highest education level: Not on file  Occupational History  . Occupation: UNEMPLOYEED    Employer: UNEMPLOYED    Comment: on disability.   Tobacco Use  . Smoking status: Current Some Day Smoker    Packs/day: 1.00    Years: 40.00    Pack years: 40.00    Types: Cigarettes  . Smokeless tobacco: Never Used  Vaping Use  . Vaping Use: Never used  Substance and Sexual Activity  . Alcohol use: Not Currently    Alcohol/week: 4.0 standard drinks    Types: 4 Cans of beer per week    Comment: Has been drinking since age 39.  Has averaged 4 22 oz cans of  beer daily until late 2015.  . Drug use: No    Types: Cocaine    Comment: 03/30/2013 "[place]"  . Sexual activity: Not on file  Other Topics Concern  . Not on file  Social History Narrative   Lives in [place] by himself.  Does not routinely exercise.   Social Determinants of Health   Financial Resource Strain:   . Difficulty of Paying Living Expenses: Not on file  Food Insecurity:   . Worried About Charity fundraiser in the Last Year: Not on file  . Ran Out of Food in the Last Year: Not on file  Transportation Needs:   . Lack of Transportation (Medical): Not on file  . Lack of Transportation (Non-Medical): Not on file  Physical Activity:   . Days of Exercise per Week: Not on file  . Minutes of Exercise per Session: Not on file  Stress:   . Feeling of Stress : Not on file  Social Connections:   . Frequency of Communication with Friends and Family: Not on file  . Frequency of Social Gatherings with Friends and Family: Not on file  . Attends Religious Services: Not on file  . Active Member of Clubs or Organizations: Not on file  . Attends Archivist Meetings: Not on file  . Marital Status: Not on file  Intimate Partner Violence:   . Fear of Current or Ex-Partner: Not on file  . Emotionally Abused: Not on file  . Physically Abused: Not on file  . Sexually Abused: Not on file    Physical Exam: Vital signs in last 24 hours: Temp:  [99 F (37.2 C)] 99 F (37.2 C) (09/20 1038) Pulse Rate:  [48] 48 (09/20 1038) Resp:  [11] 11 (09/20 1038) BP: (114)/(65) 114/65 (09/20 1038) SpO2:  [99 %] 99 % (09/20 1038) Weight:  [98.9 kg] 98.9 kg (09/20 1038)   GEN: NAD EYE: Sclerae anicteric ENT: MMM CV: Non-tachycardic GI: Soft, NT/ND NEURO:  Alert & Oriented x 3  Lab Results: No results for input(s): WBC, HGB, HCT, PLT in the last 72 hours. BMET No results for input(s): NA, K, CL, CO2, GLUCOSE, BUN, CREATININE, CALCIUM in the last 72 hours. LFT No results for  input(s): PROT, ALBUMIN, AST, ALT, ALKPHOS, BILITOT, BILIDIR, IBILI in the last 72 hours. PT/INR No results for input(s): LABPROT, INR in the last 72 hours.   Impression / Plan: This is a 59 y.o.male who presents for ERCP with evaluation s/p Ampullectomy in 2020.  The risks of an ERCP were discussed at length, including but not limited to the risk of perforation, bleeding, abdominal pain, post-ERCP pancreatitis (while usually mild can be severe and even life threatening).  The  risks and benefits of endoscopic evaluation were discussed with the patient; these include but are not limited to the risk of perforation, infection, bleeding, missed lesions, lack of diagnosis, severe illness requiring hospitalization, as well as anesthesia and sedation related illnesses.  The patient is agreeable to proceed.    Justice Britain, MD [facility] Office # [phone]

## 2020-04-16 NOTE — Op Note (Signed)
[facility] Patient Name: Jerome Adams Procedure Date : 04/16/2020 MRN: [id number] Attending MD: Justice Britain , MD Date of Birth: 1960-09-02 CSN: [id number] Age: 59 Admit Type: Outpatient Procedure:                ERCP Indications:              Biliary stent removal, Periampullary tumor,                            Follow-up of periampullary tumor, Prior Endoscopic                            Retrograde Cholangiopancreatography Providers:                Justice Britain, MD, Baird Cancer, RN, Cletis Athens, Technician Referring MD:             Milus Banister, MD, Antonietta Jewel Medicines:                General Anesthesia, Cipro 400 mg IV, Indomethacin                            100 mg PR, Glucagon 6.80 mg IV Complications:            No immediate complications. Estimated Blood Loss:     Estimated blood loss was minimal. Procedure:                Pre-Anesthesia Assessment:                           - Prior to the procedure, a History and Physical                            was performed, and patient medications and                            allergies were reviewed. The patient's tolerance of                            previous anesthesia was also reviewed. The risks                            and benefits of the procedure and the sedation                            options and risks were discussed with the patient.                            All questions were answered, and informed consent                            was obtained. Prior Anticoagulants: The patient has  taken no previous anticoagulant or antiplatelet                            agents except for aspirin. ASA Grade Assessment: II                            - A patient with mild systemic disease. After                            reviewing the risks and benefits, the patient was                            deemed in satisfactory condition to undergo  the                            procedure.                           After obtaining informed consent, the scope was                            passed under direct vision. Throughout the                            procedure, the patient's blood pressure, pulse, and                            oxygen saturations were monitored continuously. The                            [id number]-Q180V ([id number] Olympus Duodensocope was                            introduced through the mouth, and used to inject                            contrast into and used to inject contrast into the                            bile duct. The ERCP was accomplished without                            difficulty. The patient tolerated the procedure. Scope In: Scope Out: Findings:      A biliary stent was visible on the scout film.      The upper GI tract was traversed under direct vision without detailed       examination. Striped moderately erythematous mucosa without bleeding was       found in the cardia, in the gastric fundus and in the gastric body.       Biopsies were taken through the ERCP scope with the cold forceps for       histology and H. pylori evaluation. Inspection of the major papilla       revealed that an ampullectomy (papillectomy) had been performed       previously.  There was evidence of similar appearing foveolar metaplasia       (previously performed a hot avulsion to the entire region and no       dysplastic changes were noted after our initial ampullectomy). One       plastic biliary stent originating in the biliary tree was emerging from       the major papilla. One stent was removed from the biliary tree using a       snare.      A short 0.035 inch Soft Jagwire was passed into the biliary tree. The       short-nosed traction sphincterotome was passed over the guidewire and       the bile duct was then deeply cannulated. Contrast was injected. I       personally interpreted the bile duct images. Ductal  flow of contrast was       adequate. Image quality was adequate. Contrast extended to the entire       biliary tree. Opacification of the entire biliary tree except for the       gallbladder was successful. The lower third of the main bile duct and       middle third of the main bile duct contained filling defects thought to       be sludge. The main bile duct was moderately dilated. The largest       diameter was 13 mm. To discover objects, the biliary tree was swept with       a retrieval balloon starting distally and then moving towards the       bifurcation. Sludge was swept from the duct on multiple occasions.       Dilation of the common bile duct with an 03-05-09 mm balloon (to a maximum       balloon size of 9 mm) dilator was successful as a sphincteroplasty for a       total of 4-minutes. To discover objects, the biliary tree was swept with       a retrieval balloon starting at the bifurcation. Nothing was found. An       occlusion cholangiogram was performed that showed no further significant       biliary pathology. I could visualize the cystic duct filling though the       gallbladder did not fill.      A pancreatogram was not performed.      Biopsies were taken of the ampulla through the ERCP scope with the cold       forceps for histology purposes.      The duodenoscope was withdrawn from the patient. Impression:               - Erythematous mucosa in the cardia, gastric fundus                            and gastric body. Biopsied for HP evaluation.                           - Prior endoscopic papillectomy. Evidence of what                            appears to be foveolar metaplasia was noted again                            (  avulsion performed last time). Biopsied to ensure                            no adenomatous change.                           - One stent from the biliary tree was seen in the                            major papilla. This was removed.                            - The fluoroscopic examination was suspicious for                            sludge.                           - The entire main bile duct was moderately dilated.                           - The biliary tree was swept and sludge was found.                           - Sphincteroplasty performed to ensure adequate                            drainage. Recommendation:           - The patient will be observed post-procedure,                            until all discharge criteria are met.                           - Discharge patient to home.                           - Patient has a contact number available for                            emergencies. The signs and symptoms of potential                            delayed complications were discussed with the                            patient. Return to normal activities tomorrow.                            Written discharge instructions were provided to the                            patient.                           -  Low fat diet for 1 week.                           - Observe patient's clinical course.                           - Check liver enzymes (AST, ALT, alkaline                            phosphatase, bilirubin) in 2-3 weeks.                           - Await path results.                           - Watch for pancreatitis, bleeding, perforation,                            and cholangitis.                           - Repeat ERCP in 1 year for surveillance.                           - Discussion with primary GI based on patient's                            symptoms the consideration of cholecystectomy if                            there is no further evidence of adenomatous change                            in setting of biliary sludge/previous stone debris.                            ERCP stents can also lead to patients having sludge                            formation so have to keep this in mind.                            - The findings and recommendations were discussed                            with the patient.                           - The findings and recommendations were discussed                            with the patient's family. Procedure Code(s):        --- Professional ---  29574, Endoscopic retrograde                            cholangiopancreatography (ERCP); with removal of                            foreign body(s) or stent(s) from biliary/pancreatic                            duct(s)                           43264, Endoscopic retrograde                            cholangiopancreatography (ERCP); with removal of                            calculi/debris from biliary/pancreatic duct(s) Diagnosis Code(s):        --- Professional ---                           K31.89, Other diseases of stomach and duodenum                           Z96.89, Presence of other specified functional                            implants                           Z46.59, Encounter for fitting and adjustment of                            other gastrointestinal appliance and device                           D49.0, Neoplasm of unspecified behavior of                            digestive system                           Z98.890, Other specified postprocedural states                           K83.8, Other specified diseases of biliary tract CPT copyright 2019 American Medical Association. All rights reserved. The codes documented in this report are preliminary and upon coder review may  be revised to meet current compliance requirements. Justice Britain, MD 04/16/2020 12:56:38 PM Number of Addenda: 0

## 2020-04-16 NOTE — Transfer of Care (Signed)
Immediate Anesthesia Transfer of Care Note  Patient: Jerome Adams  Procedure(s) Performed: ENDOSCOPIC RETROGRADE CHOLANGIOPANCREATOGRAPHY (ERCP) WITH PROPOFOL (N/A ) REMOVAL OF STONES BILIARY DILATION STENT REMOVAL BIOPSY  Patient Location: PACU  Anesthesia Type:MAC  Level of Consciousness: awake, alert  and oriented  Airway & Oxygen Therapy: Patient Spontanous Breathing and Patient connected to face mask oxygen  Post-op Assessment: Report given to RN, Post -op Vital signs reviewed and stable and Patient moving all extremities  Post vital signs: Reviewed and stable  Last Vitals:  Vitals Value Taken Time  BP 110/73 04/16/20 1248  Temp 37 C 04/16/20 1248  Pulse 67 04/16/20 1259  Resp 16 04/16/20 1259  SpO2 91 % 04/16/20 1259  Vitals shown include unvalidated device data.  Last Pain:  Vitals:   04/16/20 1248  TempSrc:   PainSc: 0-No pain         Complications: No complications documented.

## 2020-04-16 NOTE — Anesthesia Postprocedure Evaluation (Signed)
Anesthesia Post Note  Patient: Jerome Adams  Procedure(s) Performed: ENDOSCOPIC RETROGRADE CHOLANGIOPANCREATOGRAPHY (ERCP) WITH PROPOFOL (N/A ) REMOVAL OF STONES BILIARY DILATION STENT REMOVAL BIOPSY     Patient location during evaluation: Endoscopy Anesthesia Type: General Level of consciousness: awake and alert Pain management: pain level controlled Vital Signs Assessment: post-procedure vital signs reviewed and stable Respiratory status: spontaneous breathing, nonlabored ventilation, respiratory function stable and patient connected to nasal cannula oxygen Cardiovascular status: blood pressure returned to baseline and stable Postop Assessment: no apparent nausea or vomiting Anesthetic complications: no   No complications documented.  Last Vitals:  Vitals:   04/16/20 1315 04/16/20 1318  BP:  104/63  Pulse: (!) 53 (!) 51  Resp: 13 16  Temp:  37 C  SpO2: 94% 94%    Last Pain:  Vitals:   04/16/20 1318  TempSrc:   PainSc: 0-No pain                 Audris Speaker S

## 2020-04-16 NOTE — Anesthesia Procedure Notes (Signed)
Procedure Name: Intubation Date/Time: 04/16/2020 11:26 AM Performed by: Amadeo Garnet, CRNA Pre-anesthesia Checklist: Patient identified, Emergency Drugs available, Suction available and Patient being monitored Patient Re-evaluated:Patient Re-evaluated prior to induction Oxygen Delivery Method: Circle system utilized Preoxygenation: Pre-oxygenation with 100% oxygen Induction Type: IV induction Ventilation: Mask ventilation without difficulty and Oral airway inserted - appropriate to patient size Laryngoscope Size: Mac and 4 Grade View: Grade I Tube type: Oral Tube size: 7.5 mm Number of attempts: 1 Airway Equipment and Method: Stylet Placement Confirmation: ETT inserted through vocal cords under direct vision,  breath sounds checked- equal and bilateral and positive ETCO2 Secured at: 22 cm Tube secured with: Tape Dental Injury: Teeth and Oropharynx as per pre-operative assessment

## 2020-04-17 LAB — SURGICAL PATHOLOGY

## 2020-04-18 ENCOUNTER — Encounter (HOSPITAL_COMMUNITY): Payer: Self-pay | Admitting: Gastroenterology

## 2020-04-24 ENCOUNTER — Telehealth: Payer: Self-pay | Admitting: Gastroenterology

## 2020-04-24 NOTE — Telephone Encounter (Signed)
I tried to call the patient today to discuss the results of his recent ERCP and biopsies of his ampulla as well as the stomach biopsies. 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 Repeat ERCP with attempt at avulsion or further treatments including possible APC will need to be considered. I left a voicemail for the patient to call us back otherwise I will try and call him this week at another time point. In case he calls please let him know that I will try to reach him and to get the best number and timing for [place] to reach him. Thanks. GM

## 2020-04-24 NOTE — Discharge Instructions (Signed)

## 2020-04-24 NOTE — Telephone Encounter (Signed)
Patient returned your call states the phone number on file is best and he will be available for the remaining of the day.

## 2020-04-25 ENCOUNTER — Ambulatory Visit
Admission: RE | Admit: 2020-04-25 | Discharge: 2020-04-25 | Disposition: A | Discharge status: Home / Self Care | Payer: 59 | Source: Ambulatory Visit | Attending: Neurosurgery | Admitting: Neurosurgery

## 2020-04-25 ENCOUNTER — Other Ambulatory Visit: Payer: Self-pay

## 2020-04-25 DIAGNOSIS — G959 Disease of spinal cord, unspecified: Secondary | ICD-10-CM

## 2020-04-25 MED ORDER — DIAZEPAM 5 MG PO TABS
10.0000 mg | ORAL_TABLET | Freq: Once | ORAL | Status: AC
  Administered 2020-04-25: 10 mg via ORAL

## 2020-04-25 MED ORDER — ONDANSETRON HCL 4 MG/2ML IJ SOLN
4.0000 mg | Freq: Once | INTRAMUSCULAR | Status: AC
  Administered 2020-04-25: 4 mg via INTRAMUSCULAR

## 2020-04-25 MED ORDER — MEPERIDINE HCL 100 MG/ML IJ SOLN
75.0000 mg | Freq: Once | INTRAMUSCULAR | Status: AC
  Administered 2020-04-25: 75 mg via INTRAMUSCULAR

## 2020-04-25 MED ORDER — IOPAMIDOL (ISOVUE-M 300) INJECTION 61%
10.0000 mL | Freq: Once | INTRAMUSCULAR | Status: AC
  Administered 2020-04-25: 10 mL via INTRATHECAL

## 2020-04-25 NOTE — Progress Notes (Signed)
Patient states he has been off Zoloft for at least the past two days.

## 2020-04-25 NOTE — Telephone Encounter (Signed)
Please let patient know I will call tomorrow with results. Thanks. GM

## 2020-04-25 NOTE — Telephone Encounter (Signed)
The patient has been notified of this information and all questions answered.

## 2020-04-27 NOTE — Telephone Encounter (Signed)
Pt is returning a phone call to Dr Rush Landmark

## 2020-04-27 NOTE — Telephone Encounter (Signed)
Unable to reach on 9/30 will attempt again on 10/1

## 2020-04-29 NOTE — Telephone Encounter (Signed)
I called and spoke with the patient on 04/27/2020 after multiple missed attempts and voicemails. Patient and I discussed the results of his recent ERCP with biopsies. 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 The patient has been doing well since his last ERCP with stent pull.  He does have chronic abdominal pain which is not ever truly changed but overall is feeling stable. I think it is worth another attempt an ERCP in the coming weeks for [place] to proceed with disruption of the remaining ampullary tissue.  I would try hot biopsy avulsion once again and/or potential of APC.  If after two more attempts at ERCP the patient continues to have evidence of ampullary adenoma recurrence then likely will need surgical ampullectomy. Patient very hesitant about surgery as he had been previously but is willing to do whatever it takes to live as long as possible. We will work on getting him set up for follow-up ERCP in the coming weeks and I will forward this to his primary gastroenterologist and to hepatobiliary surgeon Barry Dienes who had seen him previously.  Patty, please move forward with scheduling the patient a repeat ERCP in my next available slot.  FYI Dr. Ardis Hughs and Dr. Barry Dienes.  GM

## 2020-04-30 ENCOUNTER — Encounter: Payer: Self-pay | Admitting: Gastroenterology

## 2020-04-30 NOTE — Telephone Encounter (Signed)
06/11/20 appt can be offered for ERCP with Dr Rush Landmark.  Left message on machine to call back

## 2020-05-01 NOTE — Telephone Encounter (Signed)
Tried to reach pt and line rings then goes to a fast busy will try later

## 2020-05-02 NOTE — Telephone Encounter (Signed)
No answer no voice mail  

## 2020-05-07 NOTE — Telephone Encounter (Signed)
Left message on machine to call back  

## 2020-05-07 NOTE — Telephone Encounter (Signed)
Pt is requesting a call back from [place],

## 2020-05-08 ENCOUNTER — Other Ambulatory Visit: Payer: Self-pay

## 2020-05-08 DIAGNOSIS — R7989 Other specified abnormal findings of blood chemistry: Secondary | ICD-10-CM

## 2020-05-08 DIAGNOSIS — T859XXA Unspecified complication of internal prosthetic device, implant and graft, initial encounter: Secondary | ICD-10-CM

## 2020-05-08 DIAGNOSIS — R945 Abnormal results of liver function studies: Secondary | ICD-10-CM

## 2020-05-08 DIAGNOSIS — Q453 Other congenital malformations of pancreas and pancreatic duct: Secondary | ICD-10-CM

## 2020-05-08 DIAGNOSIS — D135 Benign neoplasm of extrahepatic bile ducts: Secondary | ICD-10-CM

## 2020-05-08 NOTE — Telephone Encounter (Signed)
Spoke with the pt and he has been scheduled for ERCP on 11/15 with Dr Rush Landmark.  All instructions sent to My Chart. Pt verified he can view this information.

## 2020-05-23 NOTE — Progress Notes (Deleted)
Cardiology Office Note   Date:  05/23/2020   ID:  Jerome Adams, DOB 1961/05/17, MRN [id number]  PCP:  Antonietta Jewel, MD  Cardiologist:   No primary care provider on file.   No chief complaint on file.    History of Present Illness: Jerome Adams is a 59 y.o. 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  At the last visit he did have palpitations and had a monitor that demonstrated NSVT with the longest run being 14 beats.   At the last visit he had breast tenderness so spironolactone was stopped.  He was tired so beta blocker was reduced.   TSH and CBC were normal.  BNP was mildly elevated.    ***  *** He comes back today and is not really feeling well.  He had some stress and that he is taking care of her young child and he has to get that off and on the bus.  The family is staying with him.  He has not been sleeping well which is his normal.  He does feel his heart racing and skipping.  The predominant complaint however is just tiredness and sometimes presyncope.  He does have chronic shortness of breath that has been slowly progressive.  He short of breath walking 25 yards.  He has been sleeping in bed but he can elevate with 2 pillows.  This does not sound like it is an acute difference.  His weight is not significantly different.  Is not had any increased lower extremity swelling.  He thinks she has had increased abdominal girth.    Past Medical History:  Diagnosis Date  . Alcohol abuse   . Arthritis    "joints" (03/30/2013)  . CHF (congestive heart failure) ([place])   . Chronic lower back pain   . Chronic pancreatitis ([place])   . CKD (chronic kidney disease), stage II   . COPD (chronic obstructive pulmonary disease) ([place]) 07/31/2014  . Depression   . Drug abuse ([place])    a. Remote drug abuse (cocaine). UDS 07/2014 neg for  cocaine.  . Essential hypertension   . GERD (gastroesophageal reflux disease)    takes  otc  . Hearing loss    decreased hearing in right ear  . Hepatitis C 2010   a. never treated - undectable virus 12/2013.  [name] Hyperlipidemia   . Hypertriglyceridemia   . NICM (nonischemic cardiomyopathy) ([place])    a. 12/2007 Echo: EF 45-50%;  b. 07/2014 Echo: EF 20-25%, diff HK, mod dil LA, sev dil RA, mild TR, PASP 31 mmHg.  . PUD (peptic ulcer disease)   . Sleep apnea    was tested here at [facility]....does not use cpap  . Spinal cord stimulator status    has had for 1 yr--inserted by [facility]  . Tobacco abuse   . Transaminitis     Past Surgical History:  Procedure Laterality Date  . ANTERIOR CERVICAL DECOMP/DISCECTOMY FUSION  04/05/2012   Procedure: ANTERIOR CERVICAL DECOMPRESSION/DISCECTOMY FUSION 1 LEVEL;  Surgeon: Elaina Hoops, MD;  Location: [facility];  Service: Neurosurgery;  Laterality: N/A;  Cervical five-six anterior cervical decompression and fusion  . BILIARY DILATION  04/16/2020   Procedure: BILIARY DILATION;  Surgeon: Rush Landmark Telford Nab., MD;  Location: [place];  Service: Gastroenterology;;  . BILIARY STENT PLACEMENT  06/20/2019   Procedure: BILIARY STENT PLACEMENT;  Surgeon: Irving Copas., MD;  Location: [place];  Service: Gastroenterology;;  . BILIARY STENT PLACEMENT N/A 09/21/2019   Procedure: BILIARY STENT PLACEMENT;  Surgeon: Irving Copas., MD;  Location: [facility];  Service: Gastroenterology;  Laterality: N/A;  . BIOPSY  03/31/2019   Procedure: BIOPSY;  Surgeon: Milus Banister, MD;  Location: [facility];  Service: Endoscopy;;  . BIOPSY  09/21/2019   Procedure: BIOPSY;  Surgeon: Irving Copas., MD;  Location: [facility];  Service: Gastroenterology;;  . BIOPSY  04/16/2020   Procedure: BIOPSY;  Surgeon: Irving Copas., MD;  Location: [place];  Service: Gastroenterology;;  . COLONOSCOPY WITH PROPOFOL N/A 08/28/2016    Procedure: COLONOSCOPY WITH PROPOFOL;  Surgeon: Milus Banister, MD;  Location: [facility];  Service: Endoscopy;  Laterality: N/A;  . ENDOSCOPIC MUCOSAL RESECTION  06/20/2019   Procedure: ENDOSCOPIC MUCOSAL RESECTION;  Surgeon: Rush Landmark, Telford Nab., MD;  Location: [facility];  Service: Gastroenterology;;  . ENDOSCOPIC RETROGRADE CHOLANGIOPANCREATOGRAPHY (ERCP) WITH PROPOFOL N/A 09/21/2019   Procedure: ENDOSCOPIC RETROGRADE CHOLANGIOPANCREATOGRAPHY (ERCP) WITH PROPOFOL;  Surgeon: Irving Copas., MD;  [facility]: [facility];  Service: Gastroenterology;  Laterality: N/A;  . ENDOSCOPIC RETROGRADE CHOLANGIOPANCREATOGRAPHY (ERCP) WITH PROPOFOL N/A 04/16/2020   Procedure: ENDOSCOPIC RETROGRADE CHOLANGIOPANCREATOGRAPHY (ERCP) WITH PROPOFOL;  Surgeon: Rush Landmark Telford Nab., MD;  Location: [place];  Service: Gastroenterology;  Laterality: N/A;  . ERCP N/A 06/20/2019   Procedure: ENDOSCOPIC RETROGRADE CHOLANGIOPANCREATOGRAPHY (ERCP);  Surgeon: Irving Copas., MD;  Location: [place];  Service: Gastroenterology;  Laterality: N/A;  . ESOPHAGOGASTRODUODENOSCOPY (EGD) WITH PROPOFOL N/A 08/28/2016   Procedure: ESOPHAGOGASTRODUODENOSCOPY (EGD) WITH PROPOFOL;  Surgeon: Milus Banister, MD;  [facility]: [facility];  Service: Endoscopy;  Laterality: N/A;  . ESOPHAGOGASTRODUODENOSCOPY (EGD) WITH PROPOFOL N/A 03/31/2019   Procedure: ESOPHAGOGASTRODUODENOSCOPY (EGD) WITH PROPOFOL;  Surgeon: Milus Banister, MD;  [facility]: [facility];  Service: Endoscopy;  Laterality: N/A;  . ESOPHAGOGASTRODUODENOSCOPY (EGD) WITH PROPOFOL N/A 06/20/2019   Procedure: ESOPHAGOGASTRODUODENOSCOPY (EGD) WITH PROPOFOL;  Surgeon: Rush Landmark Telford Nab., MD;  Location: [place];  Service: Gastroenterology;  Laterality: N/A;  . ESOPHAGOGASTRODUODENOSCOPY (EGD) WITH PROPOFOL N/A 09/21/2019   Procedure: ESOPHAGOGASTRODUODENOSCOPY (EGD) WITH PROPOFOL;  Surgeon: Rush Landmark Telford Nab., MD;  Location: [facility];   Service: Gastroenterology;  Laterality: N/A;  . EUS N/A 03/31/2019   Procedure: UPPER ENDOSCOPIC ULTRASOUND (EUS) RADIAL;  Surgeon: Milus Banister, MD;  Location: [facility];  Service: Endoscopy;  Laterality: N/A;  . FOOT SURGERY Right 2012?   "took piece off that was hurting me" (03/30/2013)  . HARDWARE REMOVAL     "took screw out of my back; it had damaged my nerve" (03/30/2013)--lumber  . HEMOSTASIS CLIP PLACEMENT  06/20/2019   Procedure: HEMOSTASIS CLIP PLACEMENT;  Surgeon: Irving Copas., MD;  Location: [place];  Service: Gastroenterology;;  . INCISION AND DRAINAGE ABSCESS Left    "lower jaw, tooth abscess" (03/30/2013)  . LEFT HEART CATHETERIZATION WITH CORONARY ANGIOGRAM N/A 08/03/2014   Procedure: LEFT HEART CATHETERIZATION WITH CORONARY ANGIOGRAM;  Surgeon: Blane Ohara, MD;  Location: [facility] C[facility];  Service: Cardiovascular;  Laterality: N/A;  . PANCREATIC STENT PLACEMENT  06/20/2019   Procedure: PANCREATIC STENT PLACEMENT;  Surgeon: Rush Landmark Telford Nab., MD;  Location: [place];  Service: Gastroenterology;;  . PANCREATIC STENT PLACEMENT  09/21/2019   Procedure: PANCREATIC STENT PLACEMENT;  Surgeon: Irving Copas., MD;  Location: [facility];  Service: Gastroenterology;;  . POLYPECTOMY  06/20/2019   Procedure: POLYPECTOMY;  Surgeon: Rush Landmark,  Telford Nab., MD;  Location: [facility];  Service: Gastroenterology;;  . POSTERIOR LUMBAR FUSION     "L4-5" (03/31/2103)  . REMOVAL OF STONES  09/21/2019   Procedure: REMOVAL OF STONES;  Surgeon: Rush Landmark Telford Nab., MD;  Location: [facility];  Service: Gastroenterology;;  . REMOVAL OF STONES  04/16/2020   Procedure: REMOVAL OF STONES;  Surgeon: Irving Copas., MD;  Location: [place];  Service: Gastroenterology;;  . Joan Mayans  06/20/2019   Procedure: Joan Mayans;  Surgeon: Irving Copas., MD;  Location: [place];  Service: Gastroenterology;;  . SPINAL CORD STIMULATOR IMPLANT   11/14/2012  . STENT REMOVAL  09/21/2019   Procedure: STENT REMOVAL;  Surgeon: Irving Copas., MD;  Location: [facility];  Service: Gastroenterology;;  . Lavell Islam REMOVAL  04/16/2020   Procedure: STENT REMOVAL;  Surgeon: Irving Copas., MD;  Location: [place];  Service: Gastroenterology;;  . [place] INJECTION  06/20/2019   Procedure: SUBMUCOSAL LIFTING INJECTION;  Surgeon: Irving Copas., MD;  Location: [place];  Service: Gastroenterology;;  . UPPER ESOPHAGEAL ENDOSCOPIC ULTRASOUND (EUS) N/A 06/20/2019   Procedure: UPPER ESOPHAGEAL ENDOSCOPIC ULTRASOUND (EUS);  Surgeon: Irving Copas., MD;  Location: [place];  Service: Gastroenterology;  Laterality: N/A;  . WRIST SURGERY Right 1980's   repair of tendons and nerves     Current Outpatient Medications  Medication Sig Dispense Refill  . aspirin EC 81 MG EC tablet Take 1 tablet (81 mg total) by mouth daily. 30 tablet 0  . metoprolol succinate (TOPROL-XL) 50 MG 24 hr tablet Take 1 tablet (50 mg total) by mouth daily. Take with or immediately following a meal. 90 tablet 3  . Multiple Vitamin (MULTIVITAMIN WITH MINERALS) TABS tablet Take 1 tablet by mouth daily.    [name] omega-3 acid ethyl esters (LOVAZA) 1 G capsule Take 1 capsule (1 g total) by mouth 2 (two) times daily. 60 capsule 5  . omeprazole (PRILOSEC) 40 MG capsule Take 1 capsule (40 mg total) by mouth 2 (two) times daily before a meal. (Patient taking differently: Take 40 mg by mouth daily. ) 60 capsule 4  . oxycodone (ROXICODONE) 30 MG immediate release tablet Take 30 mg by mouth 4 (four) times daily as needed for pain.   0  . rosuvastatin (CRESTOR) 40 MG tablet Take 40 mg by mouth daily.    . sacubitril-valsartan (ENTRESTO) 97-103 MG Take 1 tablet by mouth 2 (two) times daily. 180 tablet 3  . sertraline (ZOLOFT) 100 MG tablet Take 100 mg by mouth daily.     [name] torsemide (DEMADEX) 20 MG tablet Take 2 tablets (40 mg total) by mouth  daily. 60 tablet 11  . Vitamin D, Ergocalciferol, (DRISDOL) 1.25 MG (50000 UT) CAPS capsule Take 50,000 Units by mouth every Friday.     No current facility-administered medications for this visit.    Allergies:   Hydrocodone-acetaminophen    ROS:  Please see the history of present illness.   Otherwise, review of systems are positive for ***.   All other systems are reviewed and negative.    PHYSICAL EXAM: VS:  There were no vitals taken for this visit. , BMI There is no height or weight on file to calculate BMI. GENERAL:  Well appearing NECK:  No jugular venous distention, waveform within normal limits, carotid upstroke brisk and symmetric, no bruits, no thyromegaly LUNGS:  Clear to auscultation bilaterally CHEST:  Unremarkable HEART:  PMI not displaced or sustained,S1 and S2 within normal limits, no S3, no S4,  no clicks, no rubs, *** murmurs ABD:  Flat, positive bowel sounds normal in frequency in pitch, no bruits, no rebound, no guarding, no midline pulsatile mass, no hepatomegaly, no splenomegaly EXT:  2 plus pulses throughout, no edema, no cyanosis no clubbing    ***GENERAL:  Well appearing NECK:  No jugular venous distention, waveform within normal limits, carotid upstroke brisk and symmetric, no bruits, no thyromegaly LUNGS:  Clear to auscultation bilaterally CHEST:  Unremarkable HEART:  PMI not displaced or sustained,S1 and S2 within normal limits, no S3, no S4, no clicks, no rubs, no murmurs ABD:  Flat, positive bowel sounds normal in frequency in pitch, no bruits, no rebound, no guarding, no midline pulsatile mass, no hepatomegaly, no splenomegaly EXT:  2 plus pulses throughout, no edema, no cyanosis no clubbing   EKG:  EKG is  *** ordered today. The ekg ordered today demonstrates sinus bradycardia, rate ***, first-degree AV block, inferolateral and anterolateral T wave inversions unchanged from previous.   Recent Labs: 08/05/2019: ALT 12; BUN 16; Creatinine, Ser 1.13;  Potassium 3.7; Sodium 140 04/12/2020: BNP 250.8; Hemoglobin 13.4; Platelets 181; TSH 2.050    Lipid Panel    Component Value Date/Time   CHOL 329 (H) 03/29/2013 1238   TRIG 410 (H) 05/26/2013 1443   HDL 21 (L) 03/29/2013 1238   CHOLHDL 15.7 03/29/2013 1238   VLDL NOT CALC 03/29/2013 1238   LDLCALC NOT CALC 03/29/2013 1238   LDLDIRECT 89 12/09/2012 1225      Wt Readings from Last 3 Encounters:  04/16/20 218 lb 0.6 oz (98.9 kg)  04/12/20 219 lb 3.2 oz (99.4 kg)  02/13/20 222 lb 6.4 oz (100.9 kg)      Other studies Reviewed: Additional studies/ records that were reviewed today include:*** Review of the above records demonstrates:  Please see elsewhere in the note.     ASSESSMENT AND PLAN:  Nonischemic cardiomyopathy:   ***  Unfortunately he is fatigued and having lightheadedness.  His ejection fraction continues to be around 40 to 45% and unchanged on the recent echo.   At this point I really do not think she is tolerating the meds as listed.  He has breast tenderness which is likely the spironolactone so I am going to stop this.  Is very bradycardic with first-degree AV block so I am going to need to reduce his beta-blocker.   I am going to check labs to include a BNP, CBC,  and a TSH.   He is going to work on relieving the stress in his life.  If however he is not improved when I see him back in 1 month.  I might need to do other testing such as right and left heart catheterization.   Rapid heart rate with palpitations:    Despite the evidence of some nonsustained ventricular tachycardia his predominant issue is fatigue and he has bradycardia as above.  Therefore, I cannot uptitrate his medications I will actually go down on the beta-blocker.   Hypertriglyceridemia: His triglyceride level was over thousand.  He is able to review some blood work from his primary provider.  I will defer to their management.  Total cholesterol 280.  He is being managed with chronic  pancreatitis.  Tobacco abuse: We talked again about the need to stop smoking.  Covid vaccine: He has been vaccinated.   Current medicines are reviewed at length with the patient today.  The patient does not have concerns regarding medicines.  The following changes have been made:  As abovE  Labs/ tests ordered today include:   No orders of the defined types were placed in this encounter.    Disposition:   FU with me in 1 month   Signed, Minus Breeding, MD  05/23/2020 7:45 PM    [facility]

## 2020-05-24 ENCOUNTER — Ambulatory Visit: Payer: 59 | Admitting: Cardiology

## 2020-05-24 DIAGNOSIS — R002 Palpitations: Secondary | ICD-10-CM

## 2020-05-24 DIAGNOSIS — I428 Other cardiomyopathies: Secondary | ICD-10-CM

## 2020-05-24 DIAGNOSIS — E781 Pure hyperglyceridemia: Secondary | ICD-10-CM

## 2020-05-24 DIAGNOSIS — Z720 Tobacco use: Secondary | ICD-10-CM

## 2020-06-07 ENCOUNTER — Other Ambulatory Visit (HOSPITAL_COMMUNITY)
Admission: RE | Admit: 2020-06-07 | Discharge: 2020-06-07 | Disposition: A | Discharge status: Home / Self Care | Payer: 59 | Source: Ambulatory Visit | Attending: Gastroenterology | Admitting: Gastroenterology

## 2020-06-07 DIAGNOSIS — Z01812 Encounter for preprocedural laboratory examination: Secondary | ICD-10-CM | POA: Diagnosis present

## 2020-06-07 DIAGNOSIS — Z20822 Contact with and (suspected) exposure to covid-19: Secondary | ICD-10-CM | POA: Diagnosis not present

## 2020-06-07 LAB — SARS CORONAVIRUS 2 (TAT 6-24 HRS): SARS Coronavirus 2: NEGATIVE

## 2020-06-11 ENCOUNTER — Ambulatory Visit (HOSPITAL_COMMUNITY): Payer: 59

## 2020-06-11 ENCOUNTER — Ambulatory Visit (HOSPITAL_COMMUNITY): Payer: 59 | Admitting: Certified Registered Nurse Anesthetist

## 2020-06-11 ENCOUNTER — Other Ambulatory Visit: Payer: Self-pay

## 2020-06-11 ENCOUNTER — Encounter (HOSPITAL_COMMUNITY): Payer: Self-pay | Admitting: Gastroenterology

## 2020-06-11 ENCOUNTER — Encounter (HOSPITAL_COMMUNITY)
Admission: RE | Disposition: A | Discharge status: Home / Self Care | Payer: Self-pay | Source: Home / Self Care | Attending: Gastroenterology

## 2020-06-11 ENCOUNTER — Ambulatory Visit (HOSPITAL_COMMUNITY)
Admission: RE | Admit: 2020-06-11 | Discharge: 2020-06-11 | Disposition: A | Discharge status: Home / Self Care | Payer: 59 | Attending: Gastroenterology | Admitting: Gastroenterology

## 2020-06-11 DIAGNOSIS — K838 Other specified diseases of biliary tract: Secondary | ICD-10-CM | POA: Diagnosis not present

## 2020-06-11 DIAGNOSIS — T859XXA Unspecified complication of internal prosthetic device, implant and graft, initial encounter: Secondary | ICD-10-CM

## 2020-06-11 DIAGNOSIS — R7989 Other specified abnormal findings of blood chemistry: Secondary | ICD-10-CM

## 2020-06-11 DIAGNOSIS — R945 Abnormal results of liver function studies: Secondary | ICD-10-CM

## 2020-06-11 DIAGNOSIS — Q453 Other congenital malformations of pancreas and pancreatic duct: Secondary | ICD-10-CM

## 2020-06-11 DIAGNOSIS — Z885 Allergy status to narcotic agent status: Secondary | ICD-10-CM | POA: Insufficient documentation

## 2020-06-11 DIAGNOSIS — Z4659 Encounter for fitting and adjustment of other gastrointestinal appliance and device: Secondary | ICD-10-CM

## 2020-06-11 DIAGNOSIS — Z9889 Other specified postprocedural states: Secondary | ICD-10-CM | POA: Diagnosis not present

## 2020-06-11 DIAGNOSIS — D135 Benign neoplasm of extrahepatic bile ducts: Secondary | ICD-10-CM | POA: Insufficient documentation

## 2020-06-11 DIAGNOSIS — F17210 Nicotine dependence, cigarettes, uncomplicated: Secondary | ICD-10-CM | POA: Insufficient documentation

## 2020-06-11 HISTORY — PX: BILIARY STENT PLACEMENT: SHX5538

## 2020-06-11 HISTORY — PX: BIOPSY: SHX5522

## 2020-06-11 HISTORY — PX: ERCP: SHX5425

## 2020-06-11 HISTORY — PX: SCLEROTHERAPY: SHX6841

## 2020-06-11 HISTORY — PX: PANCREATIC STENT PLACEMENT: SHX5539

## 2020-06-11 SURGERY — ERCP, WITH INTERVENTION IF INDICATED
Anesthesia: General

## 2020-06-11 MED ORDER — GLUCAGON HCL RDNA (DIAGNOSTIC) 1 MG IJ SOLR
INTRAMUSCULAR | Status: AC
  Filled 2020-06-11: qty 1

## 2020-06-11 MED ORDER — DEXAMETHASONE SODIUM PHOSPHATE 10 MG/ML IJ SOLN
INTRAMUSCULAR | Status: DC | PRN
  Administered 2020-06-11: 8 mg via INTRAVENOUS

## 2020-06-11 MED ORDER — INDOMETHACIN 50 MG RE SUPP
RECTAL | Status: AC
  Filled 2020-06-11: qty 2

## 2020-06-11 MED ORDER — CIPROFLOXACIN IN D5W 400 MG/200ML IV SOLN
INTRAVENOUS | Status: AC
  Filled 2020-06-11: qty 200

## 2020-06-11 MED ORDER — SUGAMMADEX SODIUM 200 MG/2ML IV SOLN
INTRAVENOUS | Status: DC | PRN
  Administered 2020-06-11: 386.4 mg via INTRAVENOUS

## 2020-06-11 MED ORDER — PHENYLEPHRINE 40 MCG/ML (10ML) SYRINGE FOR IV PUSH (FOR BLOOD PRESSURE SUPPORT)
PREFILLED_SYRINGE | INTRAVENOUS | Status: DC | PRN
  Administered 2020-06-11: 40 ug via INTRAVENOUS
  Administered 2020-06-11 (×2): 80 ug via INTRAVENOUS
  Administered 2020-06-11: 40 ug via INTRAVENOUS

## 2020-06-11 MED ORDER — FENTANYL CITRATE (PF) 100 MCG/2ML IJ SOLN
INTRAMUSCULAR | Status: DC | PRN
  Administered 2020-06-11: 50 ug via INTRAVENOUS

## 2020-06-11 MED ORDER — EPINEPHRINE 1 MG/10ML IJ SOSY
PREFILLED_SYRINGE | INTRAMUSCULAR | Status: AC
  Filled 2020-06-11: qty 10

## 2020-06-11 MED ORDER — LIDOCAINE 2% (20 MG/ML) 5 ML SYRINGE
INTRAMUSCULAR | Status: DC | PRN
  Administered 2020-06-11: 60 mg via INTRAVENOUS

## 2020-06-11 MED ORDER — SODIUM CHLORIDE 0.9 % IV SOLN: INTRAVENOUS | Status: DC

## 2020-06-11 MED ORDER — PROPOFOL 10 MG/ML IV BOLUS
INTRAVENOUS | Status: AC
  Filled 2020-06-11: qty 20

## 2020-06-11 MED ORDER — ONDANSETRON HCL 4 MG/2ML IJ SOLN
INTRAMUSCULAR | Status: DC | PRN
  Administered 2020-06-11: 4 mg via INTRAVENOUS

## 2020-06-11 MED ORDER — ROCURONIUM BROMIDE 10 MG/ML (PF) SYRINGE
PREFILLED_SYRINGE | INTRAVENOUS | Status: DC | PRN
  Administered 2020-06-11: 100 mg via INTRAVENOUS

## 2020-06-11 MED ORDER — MIDAZOLAM HCL 5 MG/5ML IJ SOLN
INTRAMUSCULAR | Status: DC | PRN
  Administered 2020-06-11: 2 mg via INTRAVENOUS

## 2020-06-11 MED ORDER — LACTATED RINGERS IV SOLN
INTRAVENOUS | Status: DC
  Administered 2020-06-11: 1000 mL via INTRAVENOUS

## 2020-06-11 MED ORDER — INDOMETHACIN 50 MG RE SUPP
RECTAL | Status: DC | PRN
  Administered 2020-06-11: 100 mg via RECTAL

## 2020-06-11 MED ORDER — GLUCAGON HCL RDNA (DIAGNOSTIC) 1 MG IJ SOLR
INTRAMUSCULAR | Status: DC | PRN
  Administered 2020-06-11 (×5): .25 mg via INTRAVENOUS

## 2020-06-11 MED ORDER — SODIUM CHLORIDE 0.9 % IV SOLN
INTRAVENOUS | Status: DC | PRN
  Administered 2020-06-11: 30 mL

## 2020-06-11 MED ORDER — FENTANYL CITRATE (PF) 100 MCG/2ML IJ SOLN
INTRAMUSCULAR | Status: AC
  Filled 2020-06-11: qty 2

## 2020-06-11 MED ORDER — SODIUM CHLORIDE (PF) 0.9 % IJ SOLN
PREFILLED_SYRINGE | INTRAMUSCULAR | Status: DC | PRN
  Administered 2020-06-11: 2.5 mL

## 2020-06-11 MED ORDER — MIDAZOLAM HCL 2 MG/2ML IJ SOLN
INTRAMUSCULAR | Status: AC
  Filled 2020-06-11: qty 2

## 2020-06-11 MED ORDER — PROPOFOL 10 MG/ML IV BOLUS
INTRAVENOUS | Status: DC | PRN
  Administered 2020-06-11: 80 mg via INTRAVENOUS

## 2020-06-11 MED ORDER — CIPROFLOXACIN IN D5W 400 MG/200ML IV SOLN
INTRAVENOUS | Status: DC | PRN
  Administered 2020-06-11: 400 mg via INTRAVENOUS

## 2020-06-11 NOTE — Op Note (Addendum)
[facility] Patient Name: Jerome Adams Procedure Date: 06/11/2020 MRN: [id number] Attending MD: Justice Britain , MD Date of Birth: Jan 08, 1961 CSN: [id number] Age: 59 Admit Type: Outpatient Procedure:                ERCP Indications:              Ampullary/Periampullary Papillary Adenoma, Prior                            endoscopic papillectomy with recurrent adenomatous                            tissue, Prior Endoscopic Retrograde                            Cholangiopancreatography Providers:                Justice Britain, MD, Baird Cancer, RN, Kary Kos RN, RN, Ladona Ridgel, Technician Referring MD:             Milus Banister, MD, Antonietta Jewel Medicines:                General Anesthesia, Cipro 400 mg IV, Indomethacin                            100 mg PR, Glucagon 2.09 mg IV Complications:            No immediate complications. Estimated Blood Loss:     Estimated blood loss was minimal. Procedure:                Pre-Anesthesia Assessment:                           - Prior to the procedure, a History and Physical                            was performed, and patient medications and                            allergies were reviewed. The patient's tolerance of                            previous anesthesia was also reviewed. The risks                            and benefits of the procedure and the sedation                            options and risks were discussed with the patient.                            All questions were answered, and informed consent  was obtained. Prior Anticoagulants: The patient has                            taken no previous anticoagulant or antiplatelet                            agents except for aspirin. ASA Grade Assessment:                            III - A patient with severe systemic disease. After                            reviewing the risks and benefits,  the patient was                            deemed in satisfactory condition to undergo the                            procedure.                           After obtaining informed consent, the scope was                            passed under direct vision. Throughout the                            procedure, the patient's blood pressure, pulse, and                            oxygen saturations were monitored continuously. The                            ERCP [id number] was introduced through the mouth, and                            used to inject contrast into and used to inject                            contrast into the bile duct and ventral pancreatic                            duct. The ERCP was accomplished without difficulty.                            The patient tolerated the procedure. Scope In: Scope Out: Findings:      The scout film was normal.      The esophagus was successfully intubated under direct vision without       detailed examination of the pharynx, larynx, and associated structures,       and upper GI tract. The major papilla was edematous and congested and       consistent with evidence of what had previously been metaplasia on prior       resection but  on last biopsies was consistent with recurrent adenoma.       Inspection of the major papilla revealed that biliary and pancreatic       sphincterotomies had been performed previously. The biliary       sphincterotomy appeared open. The pancreatic sphincterotomy appeared       open.      A short 0.035 inch Soft Jagwire was passed into the biliary tree. The       Autotome sphincterotome was passed over the guidewire and the bile duct       was then deeply cannulated. Contrast was injected. I personally       interpreted the bile duct images. Ductal flow of contrast was adequate.       Image quality was adequate. Contrast extended to the hepatic ducts.       Opacification of the entire biliary tree except for the  cystic duct and       gallbladder was successful. The maximum diameter of the ducts was 10 mm.       To discover objects, the biliary tree was swept with a retrieval balloon       starting at the bifurcation. Nothing was found. An occlusion       cholangiogram was performed that showed no further significant biliary       pathology.      A short 0.035 inch Soft Jagwire was passed into the ventral pancreatic       duct. The ventral pancreatic duct was then deeply cannulated with the       Autotome sphincterotome. A small amount of contrast was injected. I       personally interpreted the pancreatic duct images. Ductal flow of       contrast was adequate. Image quality was adequate. Contrast extended to       the pancreatic duct. The entire opacified area was normal.      Decision made to pursue Avulsion of the recurrent ampullary adenoma. The       region was successfully injected with 2.5 mL of a 1:100000 solution of       epinephrine through the ERCP scope to aid in hemostasis. Fulguration       through the ERCP scope to ablate the lesion by hot biopsy forceps       avulsion was successful. These specimens were sent for pathology.      A short 0.035 inch Soft Jagwire was passed into the ventral pancreatic       duct. One 4 Fr by 5 cm temporary plastic pancreatic stent with a single       external pigtail was placed into the ventral pancreatic duct. The stent       was in good position.      A short 0.035 inch Soft Jagwire was passed into the biliary tree. One 10       Fr by 5 cm plastic biliary stent with a single external flap and a       single internal flap was placed into the common bile duct. Bile flowed       through the stent. The stent was in good position.      The duodenoscope was withdrawn from the patient. Impression:               - The major papilla appeared edematous and  congested and consistent with recurrent adenoma (as                             previously biopsied). Both pancreatic and biliary                            sphincterotomy sites were open.                           - The biliary tree was swept and nothing was found.                           - The ampulla was injected with Epinephrine to aid                            in hemostasis.                           - Fulguration with hot biopsy forceps as an                            Avulsion technique was performed.                           - One temporary plastic pancreatic stent was placed                            into the ventral pancreatic duct.                           - One plastic biliary stent was placed into the                            common bile duct. Moderate Sedation:      Not Applicable - Patient had care per Anesthesia. Recommendation:           - The patient will be observed post-procedure,                            until all discharge criteria are met.                           - Discharge patient to home.                           - Patient has a contact number available for                            emergencies. The signs and symptoms of potential                            delayed complications were discussed with the                            patient. Return to normal activities tomorrow.  Written discharge instructions were provided to the                            patient.                           - Low fat diet for at least 1 week.                           - Observe patient's clinical course.                           - Await path results.                           - Patient will need KUB in 10-14 days to ensure                            Pancreatic stent has fallen out otherwise it will                            need to be removed endoscopically.                           - Repeat ERCP in 4-6 months for surveillance. If                            adenomatous tissue returns then at least 1 final                             attempt at therapy with APC could be considered                            prior to sending patient to [facility] for                            further therapy vs Surgical managment via                            Ampullectomy.                           - Watch for pancreatitis, bleeding, perforation,                            and cholangitis.                           - The findings and recommendations were discussed                            with the patient. Procedure Code(s):        --- Professional ---                           [phone], Endoscopic  retrograde                            cholangiopancreatography (ERCP); with placement of                            endoscopic stent into biliary or pancreatic duct,                            including pre- and post-dilation and guide wire                            passage, when performed, including sphincterotomy,                            when performed, each stent                           43274, 51, Endoscopic retrograde                            cholangiopancreatography (ERCP); with placement of                            endoscopic stent into biliary or pancreatic duct,                            including pre- and post-dilation and guide wire                            passage, when performed, including sphincterotomy,                            when performed, each stent                           [id number], Endoscopic retrograde                            cholangiopancreatography (ERCP); with ablation of                            tumor(s), polyp(s), or other lesion(s), including                            pre- and post-dilation and guide wire passage, when                            performed Diagnosis Code(s):        --- Professional ---                           D13.5, Benign neoplasm of extrahepatic bile ducts                           Z98.890, Other specified postprocedural states  K83.8, Other specified diseases of biliary tract CPT copyright 2019 American Medical Association. All rights reserved. The codes documented in this report are preliminary and upon coder review may  be revised to meet current compliance requirements. Justice Britain, MD 06/11/2020 3:43:37 PM Number of Addenda: 0

## 2020-06-11 NOTE — Anesthesia Postprocedure Evaluation (Signed)
Anesthesia Post Note  Patient: Jerome Adams  Procedure(s) Performed: ENDOSCOPIC RETROGRADE CHOLANGIOPANCREATOGRAPHY (ERCP) (N/A ) HOT HEMOSTASIS (ARGON PLASMA COAGULATION/BICAP) (N/A ) SCLEROTHERAPY BIOPSY PANCREATIC STENT PLACEMENT BILIARY STENT PLACEMENT (N/A )     Patient location during evaluation: PACU Anesthesia Type: General Level of consciousness: awake and alert Pain management: pain level controlled Vital Signs Assessment: post-procedure vital signs reviewed and stable Respiratory status: spontaneous breathing, nonlabored ventilation, respiratory function stable and patient connected to nasal cannula oxygen Cardiovascular status: blood pressure returned to baseline and stable Postop Assessment: no apparent nausea or vomiting Anesthetic complications: no   No complications documented.  Last Vitals:  Vitals:   06/11/20 1610 06/11/20 1616  BP: 120/78 114/66  Pulse: (!) 56 (!) 50  Resp: 12 10  Temp:    SpO2: 96% 91%               Effie Berkshire

## 2020-06-11 NOTE — H&P (Signed)
GASTROENTEROLOGY PROCEDURE H&P NOTE   Primary Care Physician: Antonietta Jewel, MD  HPI: Jerome Adams is a 59 y.o. male who presents for ERCP with recurrent ampullary adenoma for attempt at further resection.  Past Medical History:  Diagnosis Date  . Alcohol abuse   . Arthritis    "joints" (03/30/2013)  . CHF (congestive heart failure) ([place])   . Chronic lower back pain   . Chronic pancreatitis ([place])   . CKD (chronic kidney disease), stage II   . COPD (chronic obstructive pulmonary disease) ([place]) 07/31/2014  . Depression   . Drug abuse ([place])    a. Remote drug abuse (cocaine). UDS 07/2014 neg for cocaine.  . Essential hypertension   . GERD (gastroesophageal reflux disease)    takes  otc  . Hearing loss    decreased hearing in right ear  . Hepatitis C 2010   a. never treated - undectable virus 12/2013.  [name] Hyperlipidemia   . Hypertriglyceridemia   . NICM (nonischemic cardiomyopathy) ([place])    a. 12/2007 Echo: EF 45-50%;  b. 07/2014 Echo: EF 20-25%, diff HK, mod dil LA, sev dil RA, mild TR, PASP 31 mmHg.  . PUD (peptic ulcer disease)   . Sleep apnea    was tested here at [facility]....does not use cpap  . Spinal cord stimulator status    has had for 1 yr--inserted by [facility]  . Tobacco abuse   . Transaminitis    Past Surgical History:  Procedure Laterality Date  . ANTERIOR CERVICAL DECOMP/DISCECTOMY FUSION  04/05/2012   Procedure: ANTERIOR CERVICAL DECOMPRESSION/DISCECTOMY FUSION 1 LEVEL;  Surgeon: Elaina Hoops, MD;  Location: [place] [facility];  Service: Neurosurgery;  Laterality: N/A;  Cervical five-six anterior cervical decompression and fusion  . BILIARY DILATION  04/16/2020   Procedure: BILIARY DILATION;  Surgeon: Rush Landmark Telford Nab., MD;  Location: [place];  Service: Gastroenterology;;  . BILIARY STENT PLACEMENT  06/20/2019   Procedure: BILIARY STENT PLACEMENT;  Surgeon: Irving Copas., MD;  Location: [place];  Service: Gastroenterology;;  . BILIARY STENT  PLACEMENT N/A 09/21/2019   Procedure: BILIARY STENT PLACEMENT;  Surgeon: Irving Copas., MD;  Location[facility];  Service: Gastroenterology;  Laterality: N/A;  . BIOPSY  03/31/2019   Procedure: BIOPSY;  Surgeon: Milus Banister, MD;  Location: [facility];  Service: Endoscopy;;  . BIOPSY  09/21/2019   Procedure: BIOPSY;  Surgeon: Irving Copas., MD;  Location: [facility];  Service: Gastroenterology;;  . BIOPSY  04/16/2020   Procedure: BIOPSY;  Surgeon: Irving Copas., MD;  Location: [place];  Service: Gastroenterology;;  . COLONOSCOPY WITH PROPOFOL N/A 08/28/2016   Procedure: COLONOSCOPY WITH PROPOFOL;  Surgeon: Milus Banister, MD;  Location: [facility];  Service: Endoscopy;  Laterality: N/A;  . ENDOSCOPIC MUCOSAL RESECTION  06/20/2019   Procedure: ENDOSCOPIC MUCOSAL RESECTION;  Surgeon: Rush Landmark, Telford Nab., MD;  Location: [facility] END[facility]COPY;  Service: Gastroenterology;;  . ENDOSCOPIC RETROGRADE CHOLANGIOPANCREATOGRAPHY (ERCP) WITH PROPOFOL N/A 09/21/2019   Procedure: ENDOSCOPIC RETROGRADE CHOLANGIOPANCREATOGRAPHY (ERCP) WITH PROPOFOL;  Surgeon: Irving Copas., MD;  Location: [facility];  Service: Gastroenterology;  Laterality: N/A;  . ENDOSCOPIC RETROGRADE CHOLANGIOPANCREATOGRAPHY (ERCP) WITH PROPOFOL N/A 04/16/2020   Procedure: ENDOSCOPIC RETROGRADE CHOLANGIOPANCREATOGRAPHY (ERCP) WITH PROPOFOL;  Surgeon: Rush Landmark Telford Nab., MD;  Location: [place];  Service: Gastroenterology;  Laterality: N/A;  . ERCP N/A 06/20/2019   Procedure: ENDOSCOPIC RETROGRADE CHOLANGIOPANCREATOGRAPHY (ERCP);  Surgeon: Irving Copas., MD;  Location: [place];  Service: Gastroenterology;  Laterality: N/A;  .  ESOPHAGOGASTRODUODENOSCOPY (EGD) WITH PROPOFOL N/A 08/28/2016   Procedure: ESOPHAGOGASTRODUODENOSCOPY (EGD) WITH PROPOFOL;  Surgeon: Milus Banister, MD;  [facility];  Service: Endoscopy;  Laterality: N/A;  . ESOPHAGOGASTRODUODENOSCOPY (EGD)  WITH PROPOFOL N/A 03/31/2019   Procedure: ESOPHAGOGASTRODUODENOSCOPY (EGD) WITH PROPOFOL;  Surgeon: Milus Banister, MD;  [facility]: [facility];  Service: Endoscopy;  Laterality: N/A;  . ESOPHAGOGASTRODUODENOSCOPY (EGD) WITH PROPOFOL N/A 06/20/2019   Procedure: ESOPHAGOGASTRODUODENOSCOPY (EGD) WITH PROPOFOL;  Surgeon: Rush Landmark Telford Nab., MD;  Location: [place];  Service: Gastroenterology;  Laterality: N/A;  . ESOPHAGOGASTRODUODENOSCOPY (EGD) WITH PROPOFOL N/A 09/21/2019   Procedure: ESOPHAGOGASTRODUODENOSCOPY (EGD) WITH PROPOFOL;  Surgeon: Rush Landmark Telford Nab., MD;  [facility];  Service: Gastroenterology;  Laterality: N/A;  . EUS N/A 03/31/2019   Procedure: UPPER ENDOSCOPIC ULTRASOUND (EUS) RADIAL;  Surgeon: Milus Banister, MD;  Location: [facility];  Service: Endoscopy;  Laterality: N/A;  . FOOT SURGERY Right 2012?   "took piece off that was hurting me" (03/30/2013)  . HARDWARE REMOVAL     "took screw out of my back; it had damaged my nerve" (03/30/2013)--lumber  . HEMOSTASIS CLIP PLACEMENT  06/20/2019   Procedure: HEMOSTASIS CLIP PLACEMENT;  Surgeon: Irving Copas., MD;  Location: [place];  Service: Gastroenterology;;  . INCISION AND DRAINAGE ABSCESS Left    "lower jaw, tooth abscess" (03/30/2013)  . LEFT HEART CATHETERIZATION WITH CORONARY ANGIOGRAM N/A 08/03/2014   Procedure: LEFT HEART CATHETERIZATION WITH CORONARY ANGIOGRAM;  Surgeon: Blane Ohara, MD;  Location: [facility] CATH LAB;  Service: Cardiovascular;  Laterality: N/A;  . PANCREATIC STENT PLACEMENT  06/20/2019   Procedure: PANCREATIC STENT PLACEMENT;  Surgeon: Rush Landmark Telford Nab., MD;  Location: [place];  Service: Gastroenterology;;  . PANCREATIC STENT PLACEMENT  09/21/2019   Procedure: PANCREATIC STENT PLACEMENT;  Surgeon: Irving Copas., MD;  Location: [facility];  Service: Gastroenterology;;  . POLYPECTOMY  06/20/2019   Procedure: POLYPECTOMY;  Surgeon: Irving Copas.,  MD;  Location: [place];  Service: Gastroenterology;;  . POSTERIOR LUMBAR FUSION     "L4-5" (03/31/2103)  . REMOVAL OF STONES  09/21/2019   Procedure: REMOVAL OF STONES;  Surgeon: Rush Landmark Telford Nab., MD;  Location[facility];  Service: Gastroenterology;;  . REMOVAL OF STONES  04/16/2020   Procedure: REMOVAL OF STONES;  Surgeon: Irving Copas., MD;  Location: [place];  Service: Gastroenterology;;  . Joan Mayans  06/20/2019   Procedure: Joan Mayans;  Surgeon: Irving Copas., MD;  Location: [place];  Service: Gastroenterology;;  . SPINAL CORD STIMULATOR IMPLANT  11/14/2012  . STENT REMOVAL  09/21/2019   Procedure: STENT REMOVAL;  Surgeon: Irving Copas., MD;  Location[facility];  Service: Gastroenterology;;  . Lavell Islam REMOVAL  04/16/2020   Procedure: STENT REMOVAL;  Surgeon: Irving Copas., MD;  Location: [place];  Service: Gastroenterology;;  . [place] INJECTION  06/20/2019   Procedure: SUBMUCOSAL LIFTING INJECTION;  Surgeon: Irving Copas., MD;  Location: [place];  Service: Gastroenterology;;  . UPPER ESOPHAGEAL ENDOSCOPIC ULTRASOUND (EUS) N/A 06/20/2019   Procedure: UPPER ESOPHAGEAL ENDOSCOPIC ULTRASOUND (EUS);  Surgeon: Irving Copas., MD;  Location: [place];  Service: Gastroenterology;  Laterality: N/A;  . WRIST SURGERY Right 1980's   repair of tendons and nerves   Current Facility-Administered Medications  Medication Dose Route Frequency Provider Last Rate Last Admin  . 0.9 %  sodium chloride infusion   Intravenous Continuous Mansouraty, Telford Nab., MD      . 0.9 %  sodium chloride infusion   Intravenous Continuous  Mansouraty, Telford Nab., MD      . lactated ringers infusion   Intravenous Continuous Mansouraty, Telford Nab., MD 10 mL/hr at 06/11/20 1248 Continued from Pre-op at 06/11/20 1248   Allergies  Allergen Reactions  . Hydrocodone-Acetaminophen Itching   Family History   Problem Relation Age of Onset  . Heart disease Father        H/o MI followed by SCD @ 43.  [name] Heart disease Sister        Died suddenly @ 55 - h/o CHF s/p AICD.  [name] Breast cancer Sister   . Bone cancer Mother   . Esophageal cancer Neg Hx   . Stomach cancer Neg Hx   . Colon cancer Neg Hx   . Inflammatory bowel disease Neg Hx   . Liver disease Neg Hx   . Pancreatic cancer Neg Hx   . Rectal cancer Neg Hx    Social History   Socioeconomic History  . Marital status: Divorced    Spouse name: Not on file  . Number of children: 1  . Years of education: Not on file  . Highest education level: Not on file  Occupational History  . Occupation: UNEMPLOYEED    Employer: UNEMPLOYED    Comment: on disability.   Tobacco Use  . Smoking status: Current Some Day Smoker    Packs/day: 1.00    Years: 40.00    Pack years: 40.00    Types: Cigarettes  . Smokeless tobacco: Never Used  Vaping Use  . Vaping Use: Never used  Substance and Sexual Activity  . Alcohol use: Not Currently    Alcohol/week: 4.0 standard drinks    Types: 4 Cans of beer per week    Comment: Has been drinking since age 58.  Has averaged 4 22 oz cans of beer daily until late 2015.  . Drug use: No    Types: Cocaine    Comment: 03/30/2013 "[place]"  . Sexual activity: Not on file  Other Topics Concern  . Not on file  Social History Narrative   Lives in [place] by himself.  Does not routinely exercise.   Social Determinants of Health   Financial Resource Strain:   . Difficulty of Paying Living Expenses: Not on file  Food Insecurity:   . Worried About Charity fundraiser in the Last Year: Not on file  . Ran Out of Food in the Last Year: Not on file  Transportation Needs:   . Lack of Transportation (Medical): Not on file  . Lack of Transportation (Non-Medical): Not on file  Physical Activity:   . Days of Exercise per Week: Not on file  . Minutes of Exercise per Session: Not on file  Stress:   . Feeling of Stress  : Not on file  Social Connections:   . Frequency of Communication with Friends and Family: Not on file  . Frequency of Social Gatherings with Friends and Family: Not on file  . Attends Religious Services: Not on file  . Active Member of Clubs or Organizations: Not on file  . Attends Archivist Meetings: Not on file  . Marital Status: Not on file  Intimate Partner Violence:   . Fear of Current or Ex-Partner: Not on file  . Emotionally Abused: Not on file  . Physically Abused: Not on file  . Sexually Abused: Not on file    Physical Exam: Vital signs in last 24 hours: Temp:  [98.6 F (37 C)] 98.6 F (37 C) (  11/15 1216) Pulse Rate:  [50] 50 (11/15 1216) Resp:  [11] 11 (11/15 1216) SpO2:  [96 %] 96 % (11/15 1216) Weight:  [96.6 kg] 96.6 kg (11/15 1216)   GEN: NAD EYE: Sclerae anicteric ENT: MMM CV: Non-tachycardic GI: Soft, protuberant abdomen with TTP throughout (6/10 pain currently) NEURO:  Alert & Oriented x 3  Lab Results: No results for input(s): WBC, HGB, HCT, PLT in the last 72 hours. BMET No results for input(s): NA, K, CL, CO2, GLUCOSE, BUN, CREATININE, CALCIUM in the last 72 hours. LFT No results for input(s): PROT, ALBUMIN, AST, ALT, ALKPHOS, BILITOT, BILIDIR, IBILI in the last 72 hours. PT/INR No results for input(s): LABPROT, INR in the last 72 hours.   Impression / Plan: This is a 59 y.o.male who presents for ERCP with recurrent ampullary adenoma for attempt at further resection.  The risks and benefits of endoscopic evaluation were discussed with the patient; these include but are not limited to the risk of perforation, infection, bleeding, missed lesions, lack of diagnosis, severe illness requiring hospitalization, as well as anesthesia and sedation related illnesses.  The patient is agreeable to proceed.    Justice Britain, MD [facility] Advanced [facility]oscopy Office # [phone]

## 2020-06-11 NOTE — Anesthesia Preprocedure Evaluation (Addendum)
Anesthesia Evaluation  Patient identified by MRN, date of birth, ID band Patient awake    Reviewed: Allergy & Precautions, NPO status , Patient's Chart, lab work & pertinent test results, reviewed documented beta blocker date and time   Airway Mallampati: III  TM Distance: >3 FB Neck ROM: Full    Dental  (+) Chipped, Missing, Dental Advisory Given,    Pulmonary sleep apnea (does not use CPAP) , COPD, Current Smoker and Patient abstained from smoking.,    Pulmonary exam normal breath sounds clear to auscultation       Cardiovascular hypertension, Pt. on home beta blockers and Pt. on medications + CAD and +CHF  Normal cardiovascular exam Rhythm:Regular Rate:Normal  TTE 01/2020 1. Global hypokinesis worse in the inferior base EF similar to echo done on 02/16/19 . Left ventricular ejection fraction, by estimation, is 40 to 45%. The left ventricle has mildly decreased function. The left ventricle demonstrates global hypokinesis. The left ventricular internal cavity size was mildly dilated. There is mild left ventricular hypertrophy. Left ventricular diastolic parameters were normal.  2. Right ventricular systolic function is normal. The right ventricular size is normal.  3. Left atrial size was moderately dilated.  4. Right atrial size was moderately dilated.  5. The mitral valve is normal in structure. No evidence of mitral valve  regurgitation. No evidence of mitral stenosis.  6. The aortic valve is normal in structure. Aortic valve regurgitation is not visualized. Mild aortic valve sclerosis is present, with no evidence of aortic valve stenosis.  7. The inferior vena cava is normal in size with greater than 50% respiratory variability, suggesting right atrial pressure of 3 mmHg.  Long term cardiac monitor 02/16/20-03/01/20: Underlying rhythm was sinus Occasional non sustained ventricular tachycardia with the longest run being 14  beats Rare supraventricular tachycardia with the longest run being 7 beats.    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  Reproductive/Obstetrics                          Anesthesia Physical Anesthesia Plan  ASA: III  Anesthesia Plan: General   Post-op Pain Management:    Induction: Intravenous  PONV Risk Score and Plan: 1 and Midazolam, Dexamethasone and Ondansetron  Airway Management Planned: Oral ETT  Additional Equipment:   Intra-op Plan:   Post-operative Plan: Extubation in OR  Informed Consent: I have reviewed the patients History and Physical, chart, labs and discussed the procedure including the risks, benefits and alternatives for the proposed anesthesia with the patient or authorized representative who has indicated his/her understanding and acceptance.     Dental advisory given  Plan Discussed with: CRNA  Anesthesia Plan Comments:         Anesthesia Quick Evaluation

## 2020-06-11 NOTE — Discharge Instructions (Signed)
YOU HAD AN ENDOSCOPIC PROCEDURE TODAY: Refer to the procedure report and other information in the discharge instructions given to you for any specific questions about what was found during the examination. If this information does not answer your questions, please call [place] office at [phone] to clarify.   YOU SHOULD EXPECT: Some feelings of bloating in the abdomen. Passage of more gas than usual. Walking can help get rid of the air that was put into your GI tract during the procedure and reduce the bloating. If you had a lower endoscopy (such as a colonoscopy or flexible sigmoidoscopy) you may notice spotting of blood in your stool or on the toilet paper. Some abdominal soreness may be present for a day or two, also.  DIET: Your first meal following the procedure should be a light meal and then it is ok to progress to your normal diet. A half-sandwich or bowl of soup is an example of a good first meal. Heavy or fried foods are harder to digest and may make you feel nauseous or bloated. Drink plenty of fluids but you should avoid alcoholic beverages for 24 hours. If you had a esophageal dilation, please see attached instructions for diet.    ACTIVITY: Your care partner should take you home directly after the procedure. You should plan to take it easy, moving slowly for the rest of the day. You can resume normal activity the day after the procedure however YOU SHOULD NOT DRIVE, use power tools, machinery or perform tasks that involve climbing or major physical exertion for 24 hours (because of the sedation medicines used during the test).   SYMPTOMS TO REPORT IMMEDIATELY: A gastroenterologist can be reached at any hour. Please call [phone]  for any of the following symptoms:   Following upper endoscopy (EGD, EUS, ERCP, esophageal dilation) Vomiting of blood or coffee ground material  New, significant abdominal pain  New, significant chest pain or pain under the shoulder blades  Painful or  persistently difficult swallowing  New shortness of breath  Black, tarry-looking or red, bloody stools  FOLLOW UP:  If any biopsies were taken you will be contacted by phone or by letter within the next 1-3 weeks. Call [phone]  if you have not heard about the biopsies in 3 weeks.  Please also call with any specific questions about appointments or follow up tests.  

## 2020-06-11 NOTE — Transfer of Care (Signed)
Immediate Anesthesia Transfer of Care Note  Patient: OTTO FELKINS  Procedure(s) Performed: ENDOSCOPIC RETROGRADE CHOLANGIOPANCREATOGRAPHY (ERCP) (N/A ) HOT HEMOSTASIS (ARGON PLASMA COAGULATION/BICAP) (N/A ) SCLEROTHERAPY BIOPSY PANCREATIC STENT PLACEMENT BILIARY STENT PLACEMENT (N/A )  Patient Location: PACU  Anesthesia Type:General  Level of Consciousness: drowsy and patient cooperative  Airway & Oxygen Therapy: Patient Spontanous Breathing and Patient connected to face mask oxygen  Post-op Assessment: Report given to RN and Post -op Vital signs reviewed and stable  Post vital signs: Reviewed and stable  Last Vitals:  Vitals Value Taken Time  BP 126/75 06/11/20 1540  Temp    Pulse 61 06/11/20 1543  Resp 19 06/11/20 1543  SpO2 100 % 06/11/20 1543  Vitals shown include unvalidated device data.  Last Pain:  Vitals:   06/11/20 1216  TempSrc: Oral  PainSc: 0-No pain         Complications: No complications documented.

## 2020-06-11 NOTE — Anesthesia Procedure Notes (Addendum)
Procedure Name: Intubation Date/Time: 06/11/2020 1:58 PM Performed by: Montel Clock, CRNA Pre-anesthesia Checklist: Patient identified, Emergency Drugs available, Suction available, Patient being monitored and Timeout performed Patient Re-evaluated:Patient Re-evaluated prior to induction Oxygen Delivery Method: Circle system utilized Preoxygenation: Pre-oxygenation with 100% oxygen Induction Type: IV induction Ventilation: Mask ventilation without difficulty and Oral airway inserted - appropriate to patient size Laryngoscope Size: Mac and 3 Grade View: Grade II Tube type: Oral Tube size: 7.5 mm Number of attempts: 1 Airway Equipment and Method: Stylet Placement Confirmation: ETT inserted through vocal cords under direct vision,  positive ETCO2 and breath sounds checked- equal and bilateral Secured at: 23 cm Tube secured with: Tape Dental Injury: Teeth and Oropharynx as per pre-operative assessment  Comments: Grade 2b view with downward laryngeal pressure.

## 2020-06-12 ENCOUNTER — Telehealth: Payer: Self-pay

## 2020-06-12 DIAGNOSIS — T85528S Displacement of other gastrointestinal prosthetic devices, implants and grafts, sequela: Secondary | ICD-10-CM

## 2020-06-12 LAB — SURGICAL PATHOLOGY

## 2020-06-12 NOTE — Telephone Encounter (Signed)
-----   Message from Irving Copas., MD sent at 06/11/2020  5:14 PM EST ----- Regarding: Follow up KUB Jerome Adams, This patient needs a KUB to follow up Pancreatic Stent in 10-14 days. Please arrange. ERCP in 4-6 months recall. Thanks. GM

## 2020-06-12 NOTE — Telephone Encounter (Signed)
KUB has been entered and ERCP staff message sent to call the pt in 4-6 months

## 2020-06-13 ENCOUNTER — Encounter (HOSPITAL_COMMUNITY): Payer: Self-pay | Admitting: Gastroenterology

## 2020-06-13 ENCOUNTER — Encounter: Payer: Self-pay | Admitting: Gastroenterology

## 2020-06-13 ENCOUNTER — Telehealth: Payer: Self-pay | Admitting: Gastroenterology

## 2020-06-13 NOTE — Telephone Encounter (Signed)
Pt states that his stomach has been bothering him after his procedure last Monday. He stated to feel bloated, sxs went away yesterday, However, bloating and pain is back today. He stated that pain is not severe but it is uncomfortable. he wants to know if he should go to the ED or wait a little longer.

## 2020-06-13 NOTE — Telephone Encounter (Signed)
The pt had ERCP on 11/15 and felt good the day of the procedure. The next day he had some discomfort in the upper abd.  He is not sure if it was left or right side. The pain resolved until this morning when it returned as more of a discomfort.  No fever, he did have a bowel movement. He is not passing much gas.  He says the pain is constant and is not very painful but more "nagging".  No fever or nausea. He has been advised to push fluids and go to the ED if the pain worsens or of he develops any fever.   Please advise

## 2020-06-13 NOTE — Telephone Encounter (Signed)
He has had this happen previously when we have placed stents though is certainly at risk of post ERCP pancreatitis (although usually see within 24 hours not 48 hours if it is from the procedure). Hopefully he can maintain liquids and keep things going from there. If things progress or worsen but certainly additional work-up may be required. If issues persist into tomorrow then at least obtain a KUB to review to ensure we do not see any evidence of perforation and he can come in for labs including a CBC/CMP/lipase/amylase. Certainly could be at risk of post ERCP pancreatitis though since it has been more than 24 hours this is usually less of a case but agree with need for further work-up if he persists. Please check on him tomorrow morning if he does not call. Thanks. GM

## 2020-06-14 ENCOUNTER — Other Ambulatory Visit: Payer: Self-pay

## 2020-06-14 ENCOUNTER — Other Ambulatory Visit (INDEPENDENT_AMBULATORY_CARE_PROVIDER_SITE_OTHER): Payer: 59

## 2020-06-14 ENCOUNTER — Ambulatory Visit (INDEPENDENT_AMBULATORY_CARE_PROVIDER_SITE_OTHER)
Admission: RE | Admit: 2020-06-14 | Discharge: 2020-06-14 | Disposition: A | Discharge status: Home / Self Care | Payer: 59 | Source: Ambulatory Visit | Attending: Gastroenterology | Admitting: Gastroenterology

## 2020-06-14 DIAGNOSIS — T85528S Displacement of other gastrointestinal prosthetic devices, implants and grafts, sequela: Secondary | ICD-10-CM

## 2020-06-14 DIAGNOSIS — Q453 Other congenital malformations of pancreas and pancreatic duct: Secondary | ICD-10-CM | POA: Diagnosis not present

## 2020-06-14 DIAGNOSIS — R109 Unspecified abdominal pain: Secondary | ICD-10-CM | POA: Diagnosis not present

## 2020-06-14 DIAGNOSIS — R1010 Upper abdominal pain, unspecified: Secondary | ICD-10-CM | POA: Diagnosis not present

## 2020-06-14 LAB — CBC WITH DIFFERENTIAL/PLATELET
Basophils Absolute: 0 10*3/uL (ref 0.0–0.1)
Basophils Relative: 0.7 % (ref 0.0–3.0)
Eosinophils Absolute: 0.1 10*3/uL (ref 0.0–0.7)
Eosinophils Relative: 1.2 % (ref 0.0–5.0)
HCT: 41.5 % (ref 39.0–52.0)
Hemoglobin: 13.4 g/dL (ref 13.0–17.0)
Lymphocytes Relative: 42.9 % (ref 12.0–46.0)
Lymphs Abs: 2.6 10*3/uL (ref 0.7–4.0)
MCHC: 32.3 g/dL (ref 30.0–36.0)
MCV: 75.4 fl — ABNORMAL LOW (ref 78.0–100.0)
Monocytes Absolute: 0.4 10*3/uL (ref 0.1–1.0)
Monocytes Relative: 7.4 % (ref 3.0–12.0)
Neutro Abs: 2.9 10*3/uL (ref 1.4–7.7)
Neutrophils Relative %: 47.8 % (ref 43.0–77.0)
Platelets: 168 10*3/uL (ref 150.0–400.0)
RBC: 5.51 Mil/uL (ref 4.22–5.81)
RDW: 15.1 % (ref 11.5–15.5)
WBC: 6 10*3/uL (ref 4.0–10.5)

## 2020-06-14 LAB — LIPASE: Lipase: 45 U/L (ref 11.0–59.0)

## 2020-06-14 LAB — AMYLASE: Amylase: 35 U/L (ref 27–131)

## 2020-06-14 LAB — COMPREHENSIVE METABOLIC PANEL
ALT: 24 U/L (ref 0–53)
AST: 18 U/L (ref 0–37)
Albumin: 4.2 g/dL (ref 3.5–5.2)
Alkaline Phosphatase: 93 U/L (ref 39–117)
BUN: 17 mg/dL (ref 6–23)
CO2: 32 mEq/L (ref 19–32)
Calcium: 9.3 mg/dL (ref 8.4–10.5)
Chloride: 100 mEq/L (ref 96–112)
Creatinine, Ser: 1.12 mg/dL (ref 0.40–1.50)
GFR: 72.14 mL/min (ref >60.00)
Glucose, Bld: 149 mg/dL — ABNORMAL HIGH (ref 70–99)
Potassium: 3.3 mEq/L — ABNORMAL LOW (ref 3.5–5.1)
Sodium: 139 mEq/L (ref 135–145)
Total Bilirubin: 0.5 mg/dL (ref 0.2–1.2)
Total Protein: 7.4 g/dL (ref 6.0–8.3)

## 2020-06-14 NOTE — Telephone Encounter (Signed)
X ray changed to STAT

## 2020-06-14 NOTE — Telephone Encounter (Signed)
Agree.  Thank you for the update. I will look to see how the labs look.  Can you get the x-rays to be done as stat so we can get a read this afternoon? Thanks. GM

## 2020-06-14 NOTE — Telephone Encounter (Signed)
Left message on machine to call back  

## 2020-06-14 NOTE — Telephone Encounter (Signed)
The pt continues to have abd pain and bloating.  He is coming in for labs and xray now.

## 2020-06-15 ENCOUNTER — Other Ambulatory Visit: Payer: Self-pay

## 2020-06-15 DIAGNOSIS — R1010 Upper abdominal pain, unspecified: Secondary | ICD-10-CM

## 2020-06-15 MED ORDER — POTASSIUM CHLORIDE ER 20 MEQ PO TBCR: 20.0000 meq | EXTENDED_RELEASE_TABLET | Freq: Every day | ORAL | 0 refills | Status: DC

## 2020-06-25 ENCOUNTER — Telehealth: Payer: Self-pay

## 2020-06-25 NOTE — Telephone Encounter (Signed)
The pt has been advised and will come in for xray.

## 2020-06-25 NOTE — Telephone Encounter (Signed)
Left message on machine to call back  

## 2020-06-25 NOTE — Telephone Encounter (Signed)
-----   Message from Timothy Lasso, RN sent at 06/12/2020  1:12 PM EST ----- Pt needs a KUB in 10-14 days

## 2020-06-26 ENCOUNTER — Telehealth: Payer: Self-pay | Admitting: Gastroenterology

## 2020-06-26 NOTE — Telephone Encounter (Signed)
The pt has been advised that he can have the xray tomorrow when he returns; The pt has been advised of the information and verbalized understanding.

## 2020-06-27 ENCOUNTER — Ambulatory Visit (INDEPENDENT_AMBULATORY_CARE_PROVIDER_SITE_OTHER)
Admission: RE | Admit: 2020-06-27 | Discharge: 2020-06-27 | Disposition: A | Discharge status: Home / Self Care | Payer: 59 | Source: Ambulatory Visit | Attending: Gastroenterology | Admitting: Gastroenterology

## 2020-06-27 ENCOUNTER — Other Ambulatory Visit: Payer: Self-pay

## 2020-06-27 DIAGNOSIS — T85528S Displacement of other gastrointestinal prosthetic devices, implants and grafts, sequela: Secondary | ICD-10-CM

## 2020-06-29 ENCOUNTER — Other Ambulatory Visit: Payer: Self-pay

## 2020-06-29 DIAGNOSIS — T85528S Displacement of other gastrointestinal prosthetic devices, implants and grafts, sequela: Secondary | ICD-10-CM

## 2020-07-13 ENCOUNTER — Ambulatory Visit (INDEPENDENT_AMBULATORY_CARE_PROVIDER_SITE_OTHER)
Admission: RE | Admit: 2020-07-13 | Discharge: 2020-07-13 | Disposition: A | Discharge status: Home / Self Care | Payer: 59 | Source: Ambulatory Visit | Attending: Gastroenterology | Admitting: Gastroenterology

## 2020-07-13 ENCOUNTER — Other Ambulatory Visit: Payer: Self-pay

## 2020-07-13 DIAGNOSIS — T85528S Displacement of other gastrointestinal prosthetic devices, implants and grafts, sequela: Secondary | ICD-10-CM | POA: Diagnosis not present

## 2020-07-18 ENCOUNTER — Other Ambulatory Visit: Payer: Self-pay

## 2020-07-18 MED ORDER — TORSEMIDE 20 MG PO TABS: 40.0000 mg | ORAL_TABLET | Freq: Every day | ORAL | 3 refills | Status: DC

## 2020-07-26 ENCOUNTER — Other Ambulatory Visit: Payer: Self-pay

## 2020-07-26 ENCOUNTER — Encounter (HOSPITAL_COMMUNITY): Payer: Self-pay | Admitting: Gastroenterology

## 2020-07-26 ENCOUNTER — Other Ambulatory Visit (HOSPITAL_COMMUNITY)
Admission: RE | Admit: 2020-07-26 | Discharge: 2020-07-26 | Disposition: A | Discharge status: Home / Self Care | Payer: 59 | Source: Ambulatory Visit | Attending: Gastroenterology | Admitting: Gastroenterology

## 2020-07-26 DIAGNOSIS — Z01812 Encounter for preprocedural laboratory examination: Secondary | ICD-10-CM | POA: Insufficient documentation

## 2020-07-26 DIAGNOSIS — Z20822 Contact with and (suspected) exposure to covid-19: Secondary | ICD-10-CM | POA: Insufficient documentation

## 2020-07-26 LAB — SARS CORONAVIRUS 2 (TAT 6-24 HRS): SARS Coronavirus 2: NEGATIVE

## 2020-07-26 NOTE — Progress Notes (Signed)
Unable to complete pre-procedure call for EGD schedule on Monday, 07/30/20.  LVM for pt to call back to review Covid screening questions. 

## 2020-07-26 NOTE — Progress Notes (Signed)
Talked with patient regarding procedure on Monday 1/3. Pt understands that he needs to quarantine over the weekend and that if he does not he will need to come 2.5 hours early for a rapid test piror to the procedure.  Pt aware if rapid test comes back positive, the procedure will be canceled.  Pt aware he will be called again on Sunday for pre-procedure Covid screening.  Pt verbalized understanding.

## 2020-07-26 NOTE — Progress Notes (Signed)
[name] denies chest pain or shortness of breath.  Patient was tested today for Covid and is in quarantine with family group.

## 2020-07-29 NOTE — Progress Notes (Signed)
Talked with patient to confirm his appoinment for 1/3. Patient stated he was able to quarantine over the weekend, he states he has had no fever or flu or cold symptoms and has not been around anyone who is sick. He will be at admittine at [facility] at 1200. Questions answered.

## 2020-07-30 ENCOUNTER — Encounter (HOSPITAL_COMMUNITY)
Admission: RE | Disposition: A | Discharge status: Home / Self Care | Payer: Self-pay | Source: Home / Self Care | Attending: Gastroenterology

## 2020-07-30 ENCOUNTER — Other Ambulatory Visit: Payer: Self-pay

## 2020-07-30 ENCOUNTER — Ambulatory Visit (HOSPITAL_COMMUNITY): Payer: 59 | Admitting: Certified Registered Nurse Anesthetist

## 2020-07-30 ENCOUNTER — Ambulatory Visit (HOSPITAL_COMMUNITY)
Admission: RE | Admit: 2020-07-30 | Discharge: 2020-07-30 | Disposition: A | Discharge status: Home / Self Care | Payer: 59 | Attending: Gastroenterology | Admitting: Gastroenterology

## 2020-07-30 ENCOUNTER — Encounter (HOSPITAL_COMMUNITY): Payer: Self-pay | Admitting: Gastroenterology

## 2020-07-30 DIAGNOSIS — Z4659 Encounter for fitting and adjustment of other gastrointestinal appliance and device: Secondary | ICD-10-CM

## 2020-07-30 DIAGNOSIS — Z885 Allergy status to narcotic agent status: Secondary | ICD-10-CM | POA: Insufficient documentation

## 2020-07-30 DIAGNOSIS — T183XXA Foreign body in small intestine, initial encounter: Secondary | ICD-10-CM | POA: Diagnosis not present

## 2020-07-30 DIAGNOSIS — F17210 Nicotine dependence, cigarettes, uncomplicated: Secondary | ICD-10-CM | POA: Insufficient documentation

## 2020-07-30 DIAGNOSIS — X58XXXA Exposure to other specified factors, initial encounter: Secondary | ICD-10-CM | POA: Insufficient documentation

## 2020-07-30 DIAGNOSIS — Z560 Unemployment, unspecified: Secondary | ICD-10-CM | POA: Insufficient documentation

## 2020-07-30 DIAGNOSIS — T85528S Displacement of other gastrointestinal prosthetic devices, implants and grafts, sequela: Secondary | ICD-10-CM

## 2020-07-30 DIAGNOSIS — T85528A Displacement of other gastrointestinal prosthetic devices, implants and grafts, initial encounter: Secondary | ICD-10-CM | POA: Diagnosis present

## 2020-07-30 DIAGNOSIS — Y738 Miscellaneous gastroenterology and urology devices associated with adverse incidents, not elsewhere classified: Secondary | ICD-10-CM | POA: Insufficient documentation

## 2020-07-30 HISTORY — PX: ESOPHAGOGASTRODUODENOSCOPY (EGD) WITH PROPOFOL: SHX5813

## 2020-07-30 HISTORY — PX: STENT REMOVAL: SHX6421

## 2020-07-30 SURGERY — ESOPHAGOGASTRODUODENOSCOPY (EGD) WITH PROPOFOL
Anesthesia: Monitor Anesthesia Care

## 2020-07-30 MED ORDER — LIDOCAINE 2% (20 MG/ML) 5 ML SYRINGE
INTRAMUSCULAR | Status: DC | PRN
  Administered 2020-07-30: 40 mg via INTRAVENOUS

## 2020-07-30 MED ORDER — ONDANSETRON HCL 4 MG/2ML IJ SOLN
INTRAMUSCULAR | Status: DC | PRN
  Administered 2020-07-30: 4 mg via INTRAVENOUS

## 2020-07-30 MED ORDER — PROPOFOL 10 MG/ML IV BOLUS
INTRAVENOUS | Status: DC | PRN
  Administered 2020-07-30: 50 mg via INTRAVENOUS

## 2020-07-30 MED ORDER — SODIUM CHLORIDE 0.9 % IV SOLN
INTRAVENOUS | Status: DC
Start: 2020-07-30 — End: 2020-07-30

## 2020-07-30 MED ORDER — GLYCOPYRROLATE 0.2 MG/ML IJ SOLN
INTRAMUSCULAR | Status: DC | PRN
  Administered 2020-07-30: .2 mg via INTRAVENOUS

## 2020-07-30 MED ORDER — LACTATED RINGERS IV SOLN: INTRAVENOUS | Status: DC

## 2020-07-30 MED ORDER — LACTATED RINGERS IV SOLN
INTRAVENOUS | Status: AC | PRN
  Administered 2020-07-30: 10 mL/h via INTRAVENOUS

## 2020-07-30 MED ORDER — PROPOFOL 500 MG/50ML IV EMUL
INTRAVENOUS | Status: DC | PRN
  Administered 2020-07-30: 100 ug/kg/min via INTRAVENOUS

## 2020-07-30 SURGICAL SUPPLY — 14 items

## 2020-07-30 NOTE — Anesthesia Postprocedure Evaluation (Signed)
Anesthesia Post Note  Patient: Jerome Adams  Procedure(s) Performed: ESOPHAGOGASTRODUODENOSCOPY (EGD) WITH PROPOFOL (N/A ) STENT REMOVAL     Patient location during evaluation: Endoscopy Anesthesia Type: MAC Level of consciousness: awake and alert, patient cooperative and oriented Pain management: pain level controlled Vital Signs Assessment: post-procedure vital signs reviewed and stable Respiratory status: spontaneous breathing, nonlabored ventilation and respiratory function stable Cardiovascular status: stable and blood pressure returned to baseline Postop Assessment: no apparent nausea or vomiting Anesthetic complications: no   No complications documented.  Last Vitals:  Vitals:   07/30/20 1422 07/30/20 1432  BP: 116/74 128/89  Pulse: 71 70  Resp: (!) 9 12  Temp:    SpO2: 97% 98%    Last Pain:  Vitals:   07/30/20 1432  TempSrc:   PainSc: 0-No pain                 Lenis Nettleton,E. Mansi Tokar

## 2020-07-30 NOTE — Anesthesia Preprocedure Evaluation (Signed)
Anesthesia Evaluation  Patient identified by MRN, date of birth, ID band Patient awake    Reviewed: Allergy & Precautions, NPO status , Patient's Chart, lab work & pertinent test results, reviewed documented beta blocker date and time   Airway Mallampati: III  TM Distance: >3 FB Neck ROM: Full    Dental   Pulmonary sleep apnea (does not use CPAP) , COPD, Current Smoker and Patient abstained from smoking.,    Pulmonary exam normal        Cardiovascular hypertension, Pt. on home beta blockers and Pt. on medications + CAD and +CHF  Normal cardiovascular exam  TTE 01/2020 1. Global hypokinesis worse in the inferior base EF similar to echo done on 02/16/19 . Left ventricular ejection fraction, by estimation, is 40 to 45%. The left ventricle has mildly decreased function. The left ventricle demonstrates global hypokinesis. The left ventricular internal cavity size was mildly dilated. There is mild left ventricular hypertrophy. Left ventricular diastolic parameters were normal.  2. Right ventricular systolic function is normal. The right ventricular size is normal.  3. Left atrial size was moderately dilated.  4. Right atrial size was moderately dilated.  5. The mitral valve is normal in structure. No evidence of mitral valve  regurgitation. No evidence of mitral stenosis.  6. The aortic valve is normal in structure. Aortic valve regurgitation is not visualized. Mild aortic valve sclerosis is present, with no evidence of aortic valve stenosis.  7. The inferior vena cava is normal in size with greater than 50% respiratory variability, suggesting right atrial pressure of 3 mmHg.  Long term cardiac monitor 02/16/20-03/01/20: Underlying rhythm was sinus Occasional non sustained ventricular tachycardia with the longest run being 14 beats Rare supraventricular tachycardia with the longest run being 7 beats.    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  Reproductive/Obstetrics                             Anesthesia Physical  Anesthesia Plan  ASA: III  Anesthesia Plan: MAC   Post-op Pain Management:    Induction: Intravenous  PONV Risk Score and Plan: 1 and Propofol infusion, TIVA and Treatment may vary due to age or medical condition  Airway Management Planned: Natural Airway, Nasal Cannula and Simple Face Mask  Additional Equipment: None  Intra-op Plan:   Post-operative Plan:   Informed Consent: I have reviewed the patients History and Physical, chart, labs and discussed the procedure including the risks, benefits and alternatives for the proposed anesthesia with the patient or authorized representative who has indicated his/her understanding and acceptance.       Plan Discussed with:   Anesthesia Plan Comments:        Anesthesia Quick Evaluation

## 2020-07-30 NOTE — H&P (Signed)
GASTROENTEROLOGY PROCEDURE H&P NOTE   Primary Care Physician: Quitman Livings, MD  HPI: Jerome Adams is a 60 y.o. male who presents for EGD for pancreatic stent removal.  If pancreatic stent has migrated into the PD, then will need to proceed with ERCP.  Past Medical History:  Diagnosis Date  . Alcohol abuse   . Arthritis    "joints" (03/30/2013)  . CHF (congestive heart failure) (HCC)   . Chronic lower back pain   . Chronic pancreatitis (HCC)   . CKD (chronic kidney disease), stage II    [facility], stage II  . COPD (chronic obstructive pulmonary disease) (HCC) 07/31/2014  . Depression   . Drug abuse (HCC)    a. Remote drug abuse (cocaine). UDS 07/2014 neg for cocaine.  . Essential hypertension   . GERD (gastroesophageal reflux disease)    takes  otc  . Hearing loss    decreased hearing in right ear  . Hepatitis C 2010   a. never treated - undectable virus 12/2013.  [name] Hyperlipidemia   . Hypertriglyceridemia   . NICM (nonischemic cardiomyopathy) (HCC)    a. 12/2007 Echo: EF 45-50%;  b. 07/2014 Echo: EF 20-25%, diff HK, mod dil LA, sev dil RA, mild TR, PASP 31 mmHg.  . PUD (peptic ulcer disease)   . Sleep apnea    was tested here at [facility]....does not use cpap  . Spinal cord stimulator status    has had for 1 yr--inserted by [facility]  . Tobacco abuse   . Transaminitis    Past Surgical History:  Procedure Laterality Date  . ANTERIOR CERVICAL DECOMP/DISCECTOMY FUSION  04/05/2012   Procedure: ANTERIOR CERVICAL DECOMPRESSION/DISCECTOMY FUSION 1 LEVEL;  Surgeon: Mariam Dollar, MD;  Location: [facility] NEURO ORS;  Service: Neurosurgery;  Laterality: N/A;  Cervical five-six anterior cervical decompression and fusion  . BILIARY DILATION  04/16/2020   Procedure: BILIARY DILATION;  Surgeon: Meridee Score Netty Starring., MD;  Location: [facility] ENDOSCOPY;  Service: Gastroenterology;;  . BILIARY STENT PLACEMENT  06/20/2019   Procedure: BILIARY STENT PLACEMENT;  Surgeon: Lemar Lofty., MD;   Location: [facility] ENDOSCOPY;  Service: Gastroenterology;;  . BILIARY STENT PLACEMENT N/A 09/21/2019   Procedure: BILIARY STENT PLACEMENT;  Surgeon: Lemar Lofty., MD;  Location: [facility] ENDOSCOPY;  Service: Gastroenterology;  Laterality: N/A;  . BILIARY STENT PLACEMENT N/A 06/11/2020   Procedure: BILIARY STENT PLACEMENT;  Surgeon: Meridee Score Netty Starring., MD;  Location: [facility] ENDOSCOPY;  Service: Gastroenterology;  Laterality: N/A;  . BIOPSY  03/31/2019   Procedure: BIOPSY;  Surgeon: Rachael Fee, MD;  Location: [facility] ENDOSCOPY;  Service: Endoscopy;;  . BIOPSY  09/21/2019   Procedure: BIOPSY;  Surgeon: Lemar Lofty., MD;  Location: [facility] ENDOSCOPY;  Service: Gastroenterology;;  . BIOPSY  04/16/2020   Procedure: BIOPSY;  Surgeon: Lemar Lofty., MD;  Location: [facility] ENDOSCOPY;  Service: Gastroenterology;;  . BIOPSY  06/11/2020   Procedure: BIOPSY;  Surgeon: Lemar Lofty., MD;  Location: [facility] ENDOSCOPY;  Service: Gastroenterology;;  . COLONOSCOPY WITH PROPOFOL N/A 08/28/2016   Procedure: COLONOSCOPY WITH PROPOFOL;  Surgeon: Rachael Fee, MD;  Location: [facility] ENDOSCOPY;  Service: Endoscopy;  Laterality: N/A;  . ENDOSCOPIC MUCOSAL RESECTION  06/20/2019   Procedure: ENDOSCOPIC MUCOSAL RESECTION;  Surgeon: Meridee Score, Netty Starring., MD;  Location: [facility] ENDOSCOPY;  Service: Gastroenterology;;  . ENDOSCOPIC RETROGRADE CHOLANGIOPANCREATOGRAPHY (ERCP) WITH PROPOFOL N/A 09/21/2019   Procedure: ENDOSCOPIC RETROGRADE CHOLANGIOPANCREATOGRAPHY (ERCP) WITH PROPOFOL;  Surgeon: Lemar Lofty., MD;  Location: [facility] ENDOSCOPY;  Service:  Gastroenterology;  Laterality: N/A;  . ENDOSCOPIC RETROGRADE CHOLANGIOPANCREATOGRAPHY (ERCP) WITH PROPOFOL N/A 04/16/2020   Procedure: ENDOSCOPIC RETROGRADE CHOLANGIOPANCREATOGRAPHY (ERCP) WITH PROPOFOL;  Surgeon: Rush Landmark Telford Nab., MD;  Location: [place];  Service: Gastroenterology;  Laterality: N/A;  . ERCP N/A 06/20/2019   Procedure: ENDOSCOPIC RETROGRADE  CHOLANGIOPANCREATOGRAPHY (ERCP);  Surgeon: Irving Copas., MD;  Location: [place];  Service: Gastroenterology;  Laterality: N/A;  . ERCP N/A 06/11/2020   Procedure: ENDOSCOPIC RETROGRADE CHOLANGIOPANCREATOGRAPHY (ERCP);  Surgeon: Irving Copas., MD;  Location: [facility] ENDOSCOPY;  Service: Gastroenterology;  Laterality: N/A;  . ESOPHAGOGASTRODUODENOSCOPY (EGD) WITH PROPOFOL N/A 08/28/2016   Procedure: ESOPHAGOGASTRODUODENOSCOPY (EGD) WITH PROPOFOL;  Surgeon: Milus Banister, MD;  Location: [facility] ENDOSCOPY;  Service: Endoscopy;  Laterality: N/A;  . ESOPHAGOGASTRODUODENOSCOPY (EGD) WITH PROPOFOL N/A 03/31/2019   Procedure: ESOPHAGOGASTRODUODENOSCOPY (EGD) WITH PROPOFOL;  Surgeon: Milus Banister, MD;  Location: [facility] ENDOSCOPY;  Service: Endoscopy;  Laterality: N/A;  . ESOPHAGOGASTRODUODENOSCOPY (EGD) WITH PROPOFOL N/A 06/20/2019   Procedure: ESOPHAGOGASTRODUODENOSCOPY (EGD) WITH PROPOFOL;  Surgeon: Rush Landmark Telford Nab., MD;  Location: [place];  Service: Gastroenterology;  Laterality: N/A;  . ESOPHAGOGASTRODUODENOSCOPY (EGD) WITH PROPOFOL N/A 09/21/2019   Procedure: ESOPHAGOGASTRODUODENOSCOPY (EGD) WITH PROPOFOL;  Surgeon: Rush Landmark Telford Nab., MD;  Location: [facility] ENDOSCOPY;  Service: Gastroenterology;  Laterality: N/A;  . EUS N/A 03/31/2019   Procedure: UPPER ENDOSCOPIC ULTRASOUND (EUS) RADIAL;  Surgeon: Milus Banister, MD;  Location: [facility] ENDOSCOPY;  Service: Endoscopy;  Laterality: N/A;  . FOOT SURGERY Right 2012?   "took piece off that was hurting me" (03/30/2013)  . HARDWARE REMOVAL     "took screw out of my back; it had damaged my nerve" (03/30/2013)--lumber  . HEMOSTASIS CLIP PLACEMENT  06/20/2019   Procedure: HEMOSTASIS CLIP PLACEMENT;  Surgeon: Irving Copas., MD;  Location: [place];  Service: Gastroenterology;;  . INCISION AND DRAINAGE ABSCESS Left    "lower jaw, tooth abscess" (03/30/2013)  . LEFT HEART CATHETERIZATION WITH CORONARY ANGIOGRAM N/A 08/03/2014    Procedure: LEFT HEART CATHETERIZATION WITH CORONARY ANGIOGRAM;  Surgeon: Blane Ohara, MD;  Location: [facility] CATH LAB;  Service: Cardiovascular;  Laterality: N/A;  . PANCREATIC STENT PLACEMENT  06/20/2019   Procedure: PANCREATIC STENT PLACEMENT;  Surgeon: Rush Landmark Telford Nab., MD;  Location: [place];  Service: Gastroenterology;;  . PANCREATIC STENT PLACEMENT  09/21/2019   Procedure: PANCREATIC STENT PLACEMENT;  Surgeon: Irving Copas., MD;  Location: [facility] ENDOSCOPY;  Service: Gastroenterology;;  . PANCREATIC STENT PLACEMENT  06/11/2020   Procedure: PANCREATIC STENT PLACEMENT;  Surgeon: Irving Copas., MD;  Location: [facility] ENDOSCOPY;  Service: Gastroenterology;;  . POLYPECTOMY  06/20/2019   Procedure: POLYPECTOMY;  Surgeon: Irving Copas., MD;  Location: [place];  Service: Gastroenterology;;  . POSTERIOR LUMBAR FUSION     "L4-5" (03/31/2103)  . REMOVAL OF STONES  09/21/2019   Procedure: REMOVAL OF STONES;  Surgeon: Rush Landmark Telford Nab., MD;  Location: [facility] ENDOSCOPY;  Service: Gastroenterology;;  . REMOVAL OF STONES  04/16/2020   Procedure: REMOVAL OF STONES;  Surgeon: Irving Copas., MD;  Location: [place];  Service: Gastroenterology;;  . Clide Deutscher  06/11/2020   Procedure: Clide Deutscher;  Surgeon: Irving Copas., MD;  Location: [facility] ENDOS[facility];  Service: Gastroenterology;;  . Joan Mayans  06/20/2019   Procedure: SPHINCTEROTOMY;  Surgeon: Irving Copas., MD;  Location: [place];  Service: Gastroenterology;;  . SPINAL CORD STIMULATOR IMPLANT  11/14/2012  . STENT REMOVAL  09/21/2019   Procedure: STENT REMOVAL;  Surgeon: Irving Copas., MD;  Location: [facility] ENDOSCOPY;  Service: Gastroenterology;;  . Lavell Islam REMOVAL  04/16/2020   Procedure: STENT REMOVAL;  Surgeon: Irving Copas., MD;  Location: [place];  Service: Gastroenterology;;  . [place] INJECTION  06/20/2019   Procedure: SUBMUCOSAL LIFTING  INJECTION;  Surgeon: Irving Copas., MD;  Location: [place];  Service: Gastroenterology;;  . UPPER ESOPHAGEAL ENDOSCOPIC ULTRASOUND (EUS) N/A 06/20/2019   Procedure: UPPER ESOPHAGEAL ENDOSCOPIC ULTRASOUND (EUS);  Surgeon: Irving Copas., MD;  Location: [place];  Service: Gastroenterology;  Laterality: N/A;  . WRIST SURGERY Right 1980's   repair of tendons and nerves   Current Facility-Administered Medications  Medication Dose Route Frequency Provider Last Rate Last Admin  . lactated ringers infusion   Intravenous Continuous Mansouraty, Telford Nab., MD      . lactated ringers infusion    Continuous PRN Mansouraty, Telford Nab., MD 10 mL/hr at 07/30/20 1307 10 mL/hr at 07/30/20 1307   Allergies  Allergen Reactions  . Hydrocodone-Acetaminophen Itching   Family History  Problem Relation Age of Onset  . Heart disease Father        H/o MI followed by SCD @ 70.  [name] Heart disease Sister        Died suddenly @ 64 - h/o CHF s/p AICD.  [name] Breast cancer Sister   . Bone cancer Mother   . Esophageal cancer Neg Hx   . Stomach cancer Neg Hx   . Colon cancer Neg Hx   . Inflammatory bowel disease Neg Hx   . Liver disease Neg Hx   . Pancreatic cancer Neg Hx   . Rectal cancer Neg Hx    Social History   Socioeconomic History  . Marital status: Divorced    Spouse name: Not on file  . Number of children: 1  . Years of education: Not on file  . Highest education level: Not on file  Occupational History  . Occupation: UNEMPLOYEED    Employer: UNEMPLOYED    Comment: on disability.   Tobacco Use  . Smoking status: Current Some Day Smoker    Packs/day: 1.00    Years: 40.00    Pack years: 40.00    Types: Cigarettes  . Smokeless tobacco: Never Used  Vaping Use  . Vaping Use: Never used  Substance and Sexual Activity  . Alcohol use: Not Currently    Comment: stopped 2019  . Drug use: No    Types: Cocaine    Comment: 03/30/2013 "[place]"  . Sexual activity:  Not on file  Other Topics Concern  . Not on file  Social History Narrative   Lives in [place] by himself.  Does not routinely exercise.   Social Determinants of Health   Financial Resource Strain: Not on file  Food Insecurity: Not on file  Transportation Needs: Not on file  Physical Activity: Not on file  Stress: Not on file  Social Connections: Not on file  Intimate Partner Violence: Not on file    Physical Exam: Vital signs in last 24 hours: Pulse Rate:  [72] 72 (01/03 1230) Resp:  [12] 12 (01/03 1230) BP: (148)/(95) 148/95 (01/03 1230) SpO2:  [99 %] 99 % (01/03 1230) Weight:  [98.9 kg] 98.9 kg (01/03 1230)   GEN: NAD EYE: Sclerae anicteric ENT: MMM CV: Non-tachycardic GI: Soft, TTP in mid-abdomen 5/10 discomfort NEURO:  Alert & Oriented x 3  Lab Results: No results for input(s): WBC, HGB, HCT, PLT in the last 72 hours. BMET No results for input(s): NA, K, CL, CO2, GLUCOSE,  BUN, CREATININE, CALCIUM in the last 72 hours. LFT No results for input(s): PROT, ALBUMIN, AST, ALT, ALKPHOS, BILITOT, BILIDIR, IBILI in the last 72 hours. PT/INR No results for input(s): LABPROT, INR in the last 72 hours.   Impression / Plan: This is a 60 y.o.male who presents for EGD for pancreatic stent removal.  If pancreatic stent has migrated into the PD, then will need to proceed with ERCP.  The risks of an ERCP were discussed at length, including but not limited to the risk of perforation, bleeding, abdominal pain, post-ERCP pancreatitis (while usually mild can be severe and even life threatening).  The risks and benefits of endoscopic evaluation were discussed with the patient; these include but are not limited to the risk of perforation, infection, bleeding, missed lesions, lack of diagnosis, severe illness requiring hospitalization, as well as anesthesia and sedation related illnesses.  The patient is agreeable to proceed.    Justice Britain, MD [facility] Advanced  Endoscopy Office # [phone]

## 2020-07-30 NOTE — Op Note (Signed)
[facility] Patient Name: Jerome Adams Procedure Date : 07/30/2020 MRN: [id number] Attending MD: Justice Britain , MD Date of Birth: 12/02/1960 CSN: [id number] Age: 59 Admit Type: Outpatient Procedure:                Upper GI endoscopy Indications:              Pancreatic stent removal Providers:                Justice Britain, MD, Baird Cancer, RN, Laverda Sorenson, Technician, Cira Servant, CRNA Referring MD:             Milus Banister, MD, Stark Klein MD, MD, Antonietta Jewel Medicines:                Monitored Anesthesia Care Complications:            No immediate complications. Estimated Blood Loss:     Estimated blood loss was minimal. Procedure:                Pre-Anesthesia Assessment:                           - Prior to the procedure, a History and Physical                            was performed, and patient medications and                            allergies were reviewed. The patient's tolerance of                            previous anesthesia was also reviewed. The risks                            and benefits of the procedure and the sedation                            options and risks were discussed with the patient.                            All questions were answered, and informed consent                            was obtained. Prior Anticoagulants: The patient has                            taken no previous anticoagulant or antiplatelet                            agents except for aspirin. ASA Grade Assessment:  III - A patient with severe systemic disease. After                            reviewing the risks and benefits, the patient was                            deemed in satisfactory condition to undergo the                            procedure.                           After obtaining informed consent, the endoscope was                            passed  under direct vision. Throughout the                            procedure, the patient's blood pressure, pulse, and                            oxygen saturations were monitored continuously. The                            TJF-Q180V ([id number]) Olympus Duodenoscope was                            introduced through the mouth, and advanced to the                            second part of duodenum. The upper GI endoscopy was                            accomplished without difficulty. The patient                            tolerated the procedure. Scope In: Scope Out: Findings:      No gross lesions were noted in the entire examined stomach.      Two previously placed plastic biliary pancreatic stents were seen in the       ampulla. Stent removal was accomplished with a Raptor grasping device. Impression:               - No gross lesions in the stomach.                           - Plastic biliary pancreatic stents in the                            duodenum. Removed. Recommendation:           - The patient will be observed post-procedure,                            until all discharge criteria are met.                           -  Discharge patient to home.                           - Patient has a contact number available for                            emergencies. The signs and symptoms of potential                            delayed complications were discussed with the                            patient. Return to normal activities tomorrow.                            Written discharge instructions were provided to the                            patient.                           - Resume previous diet.                           - Continue present medications.                           - Follow up ERCP at scheduled 22-monthfollow up                            after last ERCP.                           - Observe patient's clinical course.                           - The findings and  recommendations were discussed                            with the patient and caregiver. Procedure Code(s):        --- Professional ---                           4220 [id number] Esophagogastroduodenoscopy, flexible,                            transoral; with removal of foreign body(s) Diagnosis Code(s):        --- Professional ---                           Z46.59, Encounter for fitting and adjustment of                            other gastrointestinal appliance and device CPT copyright 2019 American Medical Association. All rights reserved. The codes documented in this report are preliminary and upon coder review may  be revised to meet  current compliance requirements. Justice Britain, MD 07/30/2020 2:21:26 PM Number of Addenda: 0

## 2020-07-30 NOTE — Anesthesia Procedure Notes (Signed)
Procedure Name: MAC Date/Time: 07/30/2020 1:50 PM Performed by: Lowella Dell, CRNA Pre-anesthesia Checklist: Patient identified, Emergency Drugs available, Suction available, Patient being monitored and Timeout performed Patient Re-evaluated:Patient Re-evaluated prior to induction Oxygen Delivery Method: Nasal cannula Placement Confirmation: positive ETCO2 Dental Injury: Teeth and Oropharynx as per pre-operative assessment

## 2020-07-30 NOTE — Transfer of Care (Signed)
Immediate Anesthesia Transfer of Care Note  Patient: Jerome Adams  Procedure(s) Performed: ESOPHAGOGASTRODUODENOSCOPY (EGD) WITH PROPOFOL (N/A ) STENT REMOVAL  Patient Location: PACU and Endoscopy Unit  Anesthesia Type:MAC  Level of Consciousness: drowsy  Airway & Oxygen Therapy: Patient Spontanous Breathing and Patient connected to nasal cannula oxygen  Post-op Assessment: Report given to RN and Post -op Vital signs reviewed and stable  Post vital signs: Reviewed and stable  Last Vitals:  Vitals Value Taken Time  BP    Temp    Pulse    Resp    SpO2      Last Pain:  Vitals:   07/30/20 1230  PainSc: 0-No pain         Complications: No complications documented.

## 2020-07-30 NOTE — Discharge Instructions (Signed)
YOU HAD AN ENDOSCOPIC PROCEDURE TODAY: Refer to the procedure report and other information in the discharge instructions given to you for any specific questions about what was found during the examination. If this information does not answer your questions, please call [place] office at [phone] to clarify.   YOU SHOULD EXPECT: Some feelings of bloating in the abdomen. Passage of more gas than usual. Walking can help get rid of the air that was put into your GI tract during the procedure and reduce the bloating. If you had a lower endoscopy (such as a colonoscopy or flexible sigmoidoscopy) you may notice spotting of blood in your stool or on the toilet paper. Some abdominal soreness may be present for a day or two, also.  DIET: Your first meal following the procedure should be a light meal and then it is ok to progress to your normal diet. A half-sandwich or bowl of soup is an example of a good first meal. Heavy or fried foods are harder to digest and may make you feel nauseous or bloated. Drink plenty of fluids but you should avoid alcoholic beverages for 24 hours. If you had a esophageal dilation, please see attached instructions for diet.    ACTIVITY: Your care partner should take you home directly after the procedure. You should plan to take it easy, moving slowly for the rest of the day. You can resume normal activity the day after the procedure however YOU SHOULD NOT DRIVE, use power tools, machinery or perform tasks that involve climbing or major physical exertion for 24 hours (because of the sedation medicines used during the test).   SYMPTOMS TO REPORT IMMEDIATELY: A gastroenterologist can be reached at any hour. Please call [phone]  for any of the following symptoms:   Following upper endoscopy (EGD, EUS, ERCP, esophageal dilation) Vomiting of blood or coffee ground material  New, significant abdominal pain  New, significant chest pain or pain under the shoulder blades  Painful or  persistently difficult swallowing  New shortness of breath  Black, tarry-looking or red, bloody stools  FOLLOW UP:  If any biopsies were taken you will be contacted by phone or by letter within the next 1-3 weeks. Call [phone]  if you have not heard about the biopsies in 3 weeks.  Please also call with any specific questions about appointments or follow up tests.  

## 2020-11-30 NOTE — Progress Notes (Incomplete)
Cardiology Clinic Note   Patient Name: Jerome Adams Date of Encounter: 11/30/2020  Primary Care Provider:  Antonietta Jewel, MD Primary Cardiologist:  None  Patient Profile    Jerome Lofty.  Carlise 60 year old male presents the clinic today for follow-up evaluation of his nonischemic cardiomyopathy.  Past Medical History    Past Medical History:  Diagnosis Date  . Alcohol abuse   . Arthritis    "joints" (03/30/2013)  . CHF (congestive heart failure) ([place])   . Chronic lower back pain   . Chronic pancreatitis ([place])   . CKD (chronic kidney disease), stage II    [facility], stage II  . COPD (chronic obstructive pulmonary disease) ([place]) 07/31/2014  . Depression   . Drug abuse ([place])    a. Remote drug abuse (cocaine). UDS 07/2014 neg for cocaine.  . Essential hypertension   . GERD (gastroesophageal reflux disease)    takes  otc  . Hearing loss    decreased hearing in right ear  . Hepatitis C 2010   a. never treated - undectable virus 12/2013.  [name] Hyperlipidemia   . Hypertriglyceridemia   . NICM (nonischemic cardiomyopathy) ([place])    a. 12/2007 Echo: EF 45-50%;  b. 07/2014 Echo: EF 20-25%, diff HK, mod dil LA, sev dil RA, mild TR, PASP 31 mmHg.  . PUD (peptic ulcer disease)   . Sleep apnea    was tested here at [facility]....does not use cpap  . Spinal cord stimulator status    has had for 1 yr--inserted by [facility]  . Tobacco abuse   . Transaminitis    Past Surgical History:  Procedure Laterality Date  . ANTERIOR CERVICAL DECOMP/DISCECTOMY FUSION  04/05/2012   Procedure: ANTERIOR CERVICAL DECOMPRESSION/DISCECTOMY FUSION 1 LEVEL;  Surgeon: Elaina Hoops, MD;  Location: [place] [facility];  Service: Neurosurgery;  Laterality: N/A;  Cervical five-six anterior cervical decompression and fusion  . BILIARY DILATION  04/16/2020   Procedure: BILIARY DILATION;  Surgeon: Rush Landmark Telford Nab., MD;  Location: [place];  Service: Gastroenterology;;  . BILIARY STENT PLACEMENT  06/20/2019    Procedure: BILIARY STENT PLACEMENT;  Surgeon: Irving Copas., MD;  Location: [place];  Service: Gastroenterology;;  . BILIARY STENT PLACEMENT N/A 09/21/2019   Procedure: BILIARY STENT PLACEMENT;  Surgeon: Irving Copas., MD;  Location: [facility];  Service: Gastroenterology;  Laterality: N/A;  . BILIARY STENT PLACEMENT N/A 06/11/2020   Procedure: BILIARY STENT PLACEMENT;  Surgeon: Rush Landmark Telford Nab., MD;  Location: [facility];  Service: Gastroenterology;  Laterality: N/A;  . BIOPSY  03/31/2019   Procedure: BIOPSY;  Surgeon: Milus Banister, MD;  [facility];  Service: Endoscopy;;  . BIOPSY  09/21/2019   Procedure: BIOPSY;  Surgeon: Irving Copas., MD;  Location: [facility];  Service: Gastroenterology;;  . BIOPSY  04/16/2020   Procedure: BIOPSY;  Surgeon: Irving Copas., MD;  Location: [place];  Service: Gastroenterology;;  . BIOPSY  06/11/2020   Procedure: BIOPSY;  Surgeon: Irving Copas., MD;  Location: [facility];  Service: Gastroenterology;;  . COLONOSCOPY WITH PROPOFOL N/A 08/28/2016   Procedure: COLONOSCOPY WITH PROPOFOL;  Surgeon: Milus Banister, MD;  [facility];  Service: Endoscopy;  Laterality: N/A;  . ENDOSCOPIC MUCOSAL RESECTION  06/20/2019   Procedure: ENDOSCOPIC MUCOSAL RESECTION;  Surgeon: Rush Landmark, Telford Nab., MD;  Location: [place];  Service: Gastroenterology;;  . ENDOSCOPIC RETROGRADE CHOLANGIOPANCREATOGRAPHY (ERCP) WITH PROPOFOL N/A 09/21/2019   Procedure: ENDOSCOPIC RETROGRADE CHOLANGIOPANCREATOGRAPHY (ERCP) WITH PROPOFOL;  Surgeon: Rush Landmark,  Telford Nab., MD;  Location: [facility];  Service: Gastroenterology;  Laterality: N/A;  . ENDOSCOPIC RETROGRADE CHOLANGIOPANCREATOGRAPHY (ERCP) WITH PROPOFOL N/A 04/16/2020   Procedure: ENDOSCOPIC RETROGRADE CHOLANGIOPANCREATOGRAPHY (ERCP) WITH PROPOFOL;  Surgeon: Rush Landmark Telford Nab., MD;  Location: [place];  Service: Gastroenterology;   Laterality: N/A;  . ERCP N/A 06/20/2019   Procedure: ENDOSCOPIC RETROGRADE CHOLANGIOPANCREATOGRAPHY (ERCP);  Surgeon: Irving Copas., MD;  Location: [place];  Service: Gastroenterology;  Laterality: N/A;  . ERCP N/A 06/11/2020   Procedure: ENDOSCOPIC RETROGRADE CHOLANGIOPANCREATOGRAPHY (ERCP);  Surgeon: Irving Copas., MD;  Location: [facility];  Service: Gastroenterology;  Laterality: N/A;  . ESOPHAGOGASTRODUODENOSCOPY (EGD) WITH PROPOFOL N/A 08/28/2016   Procedure: ESOPHAGOGASTRODUODENOSCOPY (EGD) WITH PROPOFOL;  Surgeon: Milus Banister, MD;  [facility];  Service: Endoscopy;  Laterality: N/A;  . ESOPHAGOGASTRODUODENOSCOPY (EGD) WITH PROPOFOL N/A 03/31/2019   Procedure: ESOPHAGOGASTRODUODENOSCOPY (EGD) WITH PROPOFOL;  Surgeon: Milus Banister, MD;  [facility];  Service: Endoscopy;  Laterality: N/A;  . ESOPHAGOGASTRODUODENOSCOPY (EGD) WITH PROPOFOL N/A 06/20/2019   Procedure: ESOPHAGOGASTRODUODENOSCOPY (EGD) WITH PROPOFOL;  Surgeon: Rush Landmark Telford Nab., MD;  Location: [place];  Service: Gastroenterology;  Laterality: N/A;  . ESOPHAGOGASTRODUODENOSCOPY (EGD) WITH PROPOFOL N/A 09/21/2019   Procedure: ESOPHAGOGASTRODUODENOSCOPY (EGD) WITH PROPOFOL;  Surgeon: Rush Landmark Telford Nab., MD;  [facility];  Service: Gastroenterology;  Laterality: N/A;  . ESOPHAGOGASTRODUODENOSCOPY (EGD) WITH PROPOFOL N/A 07/30/2020   Procedure: ESOPHAGOGASTRODUODENOSCOPY (EGD) WITH PROPOFOL;  Surgeon: Rush Landmark Telford Nab., MD;  Location: [place];  Service: Gastroenterology;  Laterality: N/A;  . EUS N/A 03/31/2019   Procedure: UPPER ENDOSCOPIC ULTRASOUND (EUS) RADIAL;  Surgeon: Milus Banister, MD;  [facility];  Service: Endoscopy;  Laterality: N/A;  . FOOT SURGERY Right 2012?   "took piece off that was hurting me" (03/30/2013)  . HARDWARE REMOVAL     "took screw out of my back; it had damaged my nerve" (03/30/2013)--lumber  . HEMOSTASIS CLIP  PLACEMENT  06/20/2019   Procedure: HEMOSTASIS CLIP PLACEMENT;  Surgeon: Irving Copas., MD;  Location: [place];  Service: Gastroenterology;;  . INCISION AND DRAINAGE ABSCESS Left    "lower jaw, tooth abscess" (03/30/2013)  . LEFT HEART CATHETERIZATION WITH CORONARY ANGIOGRAM N/A 08/03/2014   Procedure: LEFT HEART CATHETERIZATION WITH CORONARY ANGIOGRAM;  Surgeon: Blane Ohara, MD;  Location: [facility] C[facility];  Service: Cardiovascular;  Laterality: N/A;  . PANCREATIC STENT PLACEMENT  06/20/2019   Procedure: PANCREATIC STENT PLACEMENT;  Surgeon: Rush Landmark Telford Nab., MD;  Location: [place];  Service: Gastroenterology;;  . PANCREATIC STENT PLACEMENT  09/21/2019   Procedure: PANCREATIC STENT PLACEMENT;  Surgeon: Irving Copas., MD;  Location: [facility];  Service: Gastroenterology;;  . PANCREATIC STENT PLACEMENT  06/11/2020   Procedure: PANCREATIC STENT PLACEMENT;  Surgeon: Irving Copas., MD;  Location: [facility];  Service: Gastroenterology;;  . POLYPECTOMY  06/20/2019   Procedure: POLYPECTOMY;  Surgeon: Irving Copas., MD;  Location: [place];  Service: Gastroenterology;;  . POSTERIOR LUMBAR FUSION     "L4-5" (03/31/2103)  . REMOVAL OF STONES  09/21/2019   Procedure: REMOVAL OF STONES;  Surgeon: Rush Landmark Telford Nab., MD;  Location: [facility];  Service: Gastroenterology;;  . REMOVAL OF STONES  04/16/2020   Procedure: REMOVAL OF STONES;  Surgeon: Irving Copas., MD;  Location: [place];  Service: Gastroenterology;;  . Clide Deutscher  06/11/2020   Procedure: Clide Deutscher;  Surgeon: Irving Copas., MD;  [facility];  Service: Gastroenterology;;  . Joan Mayans  06/20/2019   Procedure: SPHINCTEROTOMY;  Surgeon: Mansouraty,  Telford Nab., MD;  Location: [facility];  Service: Gastroenterology;;  . SPINAL CORD STIMULATOR IMPLANT  11/14/2012  . STENT REMOVAL  09/21/2019   Procedure: STENT REMOVAL;  Surgeon:  Irving Copas., MD;  Location: [facility];  Service: Gastroenterology;;  . Lavell Islam REMOVAL  04/16/2020   Procedure: STENT REMOVAL;  Surgeon: Irving Copas., MD;  Location: [place];  Service: Gastroenterology;;  . Lavell Islam REMOVAL  07/30/2020   Procedure: STENT REMOVAL;  Surgeon: Irving Copas., MD;  Location: [place];  Service: Gastroenterology;;  pancreatic  . SUBMUCOSAL LIFTING INJECTION  06/20/2019   Procedure: SUBMUCOSAL LIFTING INJECTION;  Surgeon: Rush Landmark Telford Nab., MD;  Location: [place];  Service: Gastroenterology;;  . UPPER ESOPHAGEAL ENDOSCOPIC ULTRASOUND (EUS) N/A 06/20/2019   Procedure: UPPER ESOPHAGEAL ENDOSCOPIC ULTRASOUND (EUS);  Surgeon: Irving Copas., MD;  Location: [place];  Service: Gastroenterology;  Laterality: N/A;  . WRIST SURGERY Right 1980's   repair of tendons and nerves    Allergies  Allergies  Allergen Reactions  . Hydrocodone-Acetaminophen Itching    History of Present Illness    Mr. Winthrop has a PMH of nonischemic cardiomyopathy, shortness of breath, NSVT, mild coronary artery disease, essential hypertension, COPD, GERD, hepatitis C, chronic diarrhea, CKD stage II, ED, narcotic abuse, chronic pain, and abnormal LFTs.  Was felt that his cardiomyopathy was caused by alcohol abuse.  His EF in 2016 was 20-25% with improvement to 35-45% in 2018 and again on recheck in 2020.  He has worn a cardiac event monitor which demonstrated NSVT with the longest run being 14 beats.  He was seen by Dr. Percival Spanish on 04/12/2020.  During that time he reported he was feeling well.  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  He reported his family was staying at his home.  He reported he continued to not sleep well.  He reported feeling his heart racing and skipping.  His main complaint was tiredness and presyncope.  His chronic shortness of breath was slowly progressing.   He reported becoming short of breath with walking approximately 25 yards.  He reported sleeping in bed with 2 pillows to elevate his head.  His weight was stable.  He did not have lower extremity swelling but did feel he had increased abdominal girth.  His lab work at the time was stable.  He noted breast tenderness with spironolactone which was stopped.  He was instructed to decrease his stress in his life.  He presents to the clinic today for follow-up evaluation states***  *** denies chest pain, shortness of breath, lower extremity edema, fatigue, palpitations, melena, hematuria, hemoptysis, diaphoresis, weakness, presyncope, syncope, orthopnea, and PND.   Home Medications    Prior to Admission medications   Medication Sig Start Date End Date Taking? Authorizing Provider  aspirin EC 81 MG EC tablet Take 1 tablet (81 mg total) by mouth daily. 08/04/14   Orson Eva, MD  Cholecalciferol ([employer] D3) 125 MCG (5000 UT) capsule Take 5,000 Units by mouth daily.    [provider]  gemfibrozil (LOPID) 600 MG tablet Take 1,200 mg by mouth daily. 07/11/20   [provider]  metoprolol succinate (TOPROL-XL) 50 MG 24 hr tablet Take 1 tablet (50 mg total) by mouth daily. Take with or immediately following a meal. Patient taking differently: Take 50 mg by mouth daily at 12 noon. Take with or immediately following a meal. 04/12/20   Minus Breeding, MD  Multiple Vitamin (MULTIVITAMIN WITH MINERALS) TABS tablet Take 1 tablet by mouth daily.    [provider]  omega-3 acid ethyl esters (LOVAZA) 1 G capsule Take 1 capsule (1 g total) by mouth 2 (two) times daily. Patient taking differently: Take 2 g by mouth daily. 04/07/13   Dominic Pea, DO  omeprazole (PRILOSEC) 40 MG capsule Take 1 capsule (40 mg total) by mouth 2 (two) times daily before a meal. Patient taking differently: Take 40 mg by mouth daily. 06/20/19   Mansouraty, Telford Nab., MD  oxycodone (ROXICODONE) 30 MG immediate  release tablet Take 30 mg by mouth 4 (four) times daily as needed for pain.  06/29/17   [provider]  potassium chloride 20 MEQ TBCR Take 20 mEq by mouth daily for 7 days. 06/15/20 06/22/20  Mansouraty, Telford Nab., MD  rosuvastatin (CRESTOR) 40 MG tablet Take 40 mg by mouth daily. 05/31/19   [provider]  sacubitril-valsartan (ENTRESTO) 97-103 MG Take 1 tablet by mouth 2 (two) times daily. 01/25/20   Minus Breeding, MD  sertraline (ZOLOFT) 100 MG tablet Take 100 mg by mouth at bedtime.     [provider]  torsemide (DEMADEX) 20 MG tablet Take 2 tablets (40 mg total) by mouth daily. 07/18/20   Minus Breeding, MD    Family History    Family History  Problem Relation Age of Onset  . Heart disease Father        H/o MI followed by SCD @ 36.  [name] Heart disease Sister        Died suddenly @ 1 - h/o CHF s/p AICD.  [name] Breast cancer Sister   . Bone cancer Mother   . Esophageal cancer Neg Hx   . Stomach cancer Neg Hx   . Colon cancer Neg Hx   . Inflammatory bowel disease Neg Hx   . Liver disease Neg Hx   . Pancreatic cancer Neg Hx   . Rectal cancer Neg Hx    He indicated that his mother is deceased. He indicated that his father is deceased. He indicated that both of his sisters are alive. He indicated that the status of his neg hx is unknown.  Social History    Social History   Socioeconomic History  . Marital status: Divorced    Spouse name: Not on file  . Number of children: 1  . Years of education: Not on file  . Highest education level: Not on file  Occupational History  . Occupation: UNEMPLOYEED    Employer: UNEMPLOYED    Comment: on disability.   Tobacco Use  . Smoking status: Current Some Day Smoker    Packs/day: 1.00    Years: 40.00    Pack years: 40.00    Types: Cigarettes  . Smokeless tobacco: Never Used  Vaping Use  . Vaping Use: Never used  Substance and Sexual Activity  . Alcohol use: Not Currently    Comment: stopped 2019  .  Drug use: No    Types: Cocaine    Comment: 03/30/2013 "[place]"  . Sexual activity: Not on file  Other Topics Concern  . Not on file  Social History Narrative   Lives in [place] by himself.  Does not routinely exercise.   Social Determinants of Health   Financial Resource Strain: Not on file  Food Insecurity: Not on file  Transportation Needs: Not on file  Physical Activity: Not on file  Stress: Not on file  Social Connections: Not on file  Intimate Partner Violence: Not on file     Review of Systems    General:  No chills, fever, night sweats or weight changes.  Cardiovascular:  No chest pain, dyspnea on exertion, edema, orthopnea, palpitations, paroxysmal nocturnal dyspnea. Dermatological: No rash, lesions/masses Respiratory: No cough, dyspnea Urologic: No hematuria, dysuria Abdominal:   No nausea, vomiting, diarrhea, bright red blood per rectum, melena, or hematemesis Neurologic:  No visual changes, wkns, changes in mental status. All other systems reviewed and are otherwise negative except as noted above.  Physical Exam    VS:  There were no vitals taken for this visit. , BMI There is no height or weight on file to calculate BMI. GEN: Well nourished, well developed, in no acute distress. HEENT: normal. Neck: Supple, no JVD, carotid bruits, or masses. Cardiac: RRR, no murmurs, rubs, or gallops. No clubbing, cyanosis, edema.  Radials/DP/PT 2+ and equal bilaterally.  Respiratory:  Respirations regular and unlabored, clear to auscultation bilaterally. GI: Soft, nontender, nondistended, BS + x 4. MS: no deformity or atrophy. Skin: warm and dry, no rash. Neuro:  Strength and sensation are intact. Psych: Normal affect.  Accessory Clinical Findings    Recent Labs: 04/12/2020: BNP 250.8; TSH 2.050 06/14/2020: ALT 24; BUN 17; Creatinine, Ser 1.12; Hemoglobin 13.4; Platelets 168.0; Potassium 3.3; Sodium 139   Recent Lipid Panel    Component Value Date/Time   CHOL 329  (H) 03/29/2013 1238   TRIG 410 (H) 05/26/2013 1443   HDL 21 (L) 03/29/2013 1238   CHOLHDL 15.7 03/29/2013 1238   VLDL NOT CALC 03/29/2013 1238   LDLCALC NOT CALC 03/29/2013 1238   LDLDIRECT 89 12/09/2012 1225    ECG personally reviewed by me today- *** - No acute changes  Echocardiogram 02/24/2020 IMPRESSIONS    1. Global hypokinesis worse in the inferior base EF similar to echo done  on 02/16/19 . Left ventricular ejection fraction, by estimation, is 40 to  45%. The left ventricle has mildly decreased function. The left ventricle  demonstrates global hypokinesis.  The left ventricular internal cavity size was mildly dilated. There is  mild left ventricular hypertrophy. Left ventricular diastolic parameters  were normal.  2. Right ventricular systolic function is normal. The right ventricular  size is normal.  3. Left atrial size was moderately dilated.  4. Right atrial size was moderately dilated.  5. The mitral valve is normal in structure. No evidence of mitral valve  regurgitation. No evidence of mitral stenosis.  6. The aortic valve is normal in structure. Aortic valve regurgitation is  not visualized. Mild aortic valve sclerosis is present, with no evidence  of aortic valve stenosis.  7. The inferior vena cava is normal in size with greater than 50%  respiratory variability, suggesting right atrial pressure of 3 mmHg.   Assessment & Plan   1.  Nonischemic cardiomyopathy-no increased DOE or activity intolerance.  Able to walk 25-30 yards.  No increased weight.  Euvolemic.  Denies increased abdominal girth.  No lower extremity swelling. Continue aspirin, Entresto, torsemide, potassium Heart healthy low-sodium diet-salty 6 given Increase physical activity as tolerated Daily weights Order BMP  Palpitations- continues to have occasional brief episodes of palpitations. Continue metoprolol. Heart healthy low-sodium diet-salty 6 given Increase physical activity as  tolerated Avoid triggers caffeine, chocolate, EtOH, dehydration etc.   Hypertriglyceridemia-triglycerides 410 on 05/26/2013 Continue rosuvastatin, Lovaza Heart healthy low-sodium high-fiber diet Increase physical activity as tolerated  Tobacco abuse- continues to smoke.  Discussed importance of smoking cessation. Smoking cessation information  given   Disposition: Follow-up with Dr. Percival Spanish in 6 months  Jossie Ng. Olene Godfrey NP-C    11/30/2020, 7:09 AM [facility] [address] Office [phone] Fax [phone]  Notice: This dictation was prepared with Dragon dictation along with smaller phrase technology. Any transcriptional errors that result from this process are unintentional and may not be corrected upon review.  I spent***minutes examining this patient, reviewing medications, and using patient centered shared decision making involving her cardiac care.  Prior to her visit I spent greater than 20 minutes reviewing her past medical history,  medications, and prior cardiac tests.

## 2020-12-03 ENCOUNTER — Ambulatory Visit: Payer: Medicare Other | Admitting: General Practice

## 2020-12-05 ENCOUNTER — Other Ambulatory Visit: Payer: Self-pay | Admitting: Cardiology

## 2020-12-31 ENCOUNTER — Telehealth: Payer: Self-pay | Admitting: Gastroenterology

## 2020-12-31 NOTE — Telephone Encounter (Signed)
He can be scheduled directly.  Do not need to see him in clinic unless he feels he wants to. Thanks. GM

## 2020-12-31 NOTE — Telephone Encounter (Signed)
The pt will proceed with plan for Dr Rush Landmark performing ERCP. Dr Rush Landmark did you want repeat ERCP 6 months from the one in January or office visit first to discuss?

## 2020-12-31 NOTE — Telephone Encounter (Signed)
Inbound call from pt stating that he had surgery back in January and he was supposed to be referred to [facility] and haven't heard anything. He was doing a f/u. Please give him a call. Thank you

## 2020-12-31 NOTE — Telephone Encounter (Signed)
Dr Rush Landmark the pt states you mentioned something about referring to [facility] back in January.  I do not see any mention of that in the notes.  Should he be referred?

## 2020-12-31 NOTE — Telephone Encounter (Signed)
The pt will be called to set up ERCP in July.

## 2021-01-04 ENCOUNTER — Other Ambulatory Visit: Payer: Self-pay | Admitting: Cardiology

## 2021-03-04 ENCOUNTER — Other Ambulatory Visit: Payer: Self-pay | Admitting: Cardiology

## 2021-03-04 ENCOUNTER — Other Ambulatory Visit: Payer: Self-pay | Admitting: *Deleted

## 2021-03-04 MED ORDER — METOPROLOL SUCCINATE ER 50 MG PO TB24: 50.0000 mg | ORAL_TABLET | Freq: Every day | ORAL | 0 refills | Status: DC

## 2021-05-05 ENCOUNTER — Other Ambulatory Visit: Payer: Self-pay | Admitting: Cardiology

## 2021-06-04 ENCOUNTER — Other Ambulatory Visit: Payer: Self-pay | Admitting: Cardiology

## 2021-06-19 ENCOUNTER — Encounter: Payer: Self-pay | Admitting: Gastroenterology

## 2021-06-19 ENCOUNTER — Other Ambulatory Visit (INDEPENDENT_AMBULATORY_CARE_PROVIDER_SITE_OTHER): Payer: Medicare Other

## 2021-06-19 ENCOUNTER — Ambulatory Visit (INDEPENDENT_AMBULATORY_CARE_PROVIDER_SITE_OTHER): Payer: Medicare Other | Admitting: Gastroenterology

## 2021-06-19 ENCOUNTER — Ambulatory Visit (INDEPENDENT_AMBULATORY_CARE_PROVIDER_SITE_OTHER)
Admission: RE | Admit: 2021-06-19 | Discharge: 2021-06-19 | Disposition: A | Discharge status: Home / Self Care | Payer: Medicare Other | Source: Ambulatory Visit | Attending: Gastroenterology | Admitting: Gastroenterology

## 2021-06-19 ENCOUNTER — Other Ambulatory Visit: Payer: Self-pay

## 2021-06-19 VITALS — BP 100/64 | HR 60 | Ht 70.0 in | Wt 208.2 lb

## 2021-06-19 DIAGNOSIS — R109 Unspecified abdominal pain: Secondary | ICD-10-CM

## 2021-06-19 DIAGNOSIS — R634 Abnormal weight loss: Secondary | ICD-10-CM

## 2021-06-19 DIAGNOSIS — D135 Benign neoplasm of extrahepatic bile ducts: Secondary | ICD-10-CM

## 2021-06-19 DIAGNOSIS — R059 Cough, unspecified: Secondary | ICD-10-CM

## 2021-06-19 DIAGNOSIS — R630 Anorexia: Secondary | ICD-10-CM

## 2021-06-19 DIAGNOSIS — R140 Abdominal distension (gaseous): Secondary | ICD-10-CM

## 2021-06-19 DIAGNOSIS — R1084 Generalized abdominal pain: Secondary | ICD-10-CM

## 2021-06-19 DIAGNOSIS — R0600 Dyspnea, unspecified: Secondary | ICD-10-CM

## 2021-06-19 LAB — COMPREHENSIVE METABOLIC PANEL
ALT: 12 U/L (ref 0–53)
AST: 16 U/L (ref 0–37)
Albumin: 4.4 g/dL (ref 3.5–5.2)
Alkaline Phosphatase: 96 U/L (ref 39–117)
BUN: 15 mg/dL (ref 6–23)
CO2: 33 mEq/L — ABNORMAL HIGH (ref 19–32)
Calcium: 9.4 mg/dL (ref 8.4–10.5)
Chloride: 98 mEq/L (ref 96–112)
Creatinine, Ser: 1.15 mg/dL (ref 0.40–1.50)
GFR: 69.39 mL/min (ref >60.00)
Glucose, Bld: 128 mg/dL — ABNORMAL HIGH (ref 70–99)
Potassium: 3.6 mEq/L (ref 3.5–5.1)
Sodium: 139 mEq/L (ref 135–145)
Total Bilirubin: 0.8 mg/dL (ref 0.2–1.2)
Total Protein: 7.7 g/dL (ref 6.0–8.3)

## 2021-06-19 LAB — CBC
HCT: 42.5 % (ref 39.0–52.0)
Hemoglobin: 13.3 g/dL (ref 13.0–17.0)
MCHC: 31.2 g/dL (ref 30.0–36.0)
MCV: 76 fl — ABNORMAL LOW (ref 78.0–100.0)
Platelets: 166 10*3/uL (ref 150.0–400.0)
RBC: 5.59 Mil/uL (ref 4.22–5.81)
RDW: 15.7 % — ABNORMAL HIGH (ref 11.5–15.5)
WBC: 6.4 10*3/uL (ref 4.0–10.5)

## 2021-06-19 LAB — LIPASE: Lipase: 23 U/L (ref 11.0–59.0)

## 2021-06-19 MED ORDER — SUCRALFATE 1 GM/10ML PO SUSP: 1.0000 g | Freq: Four times a day (QID) | ORAL | 1 refills | Status: DC

## 2021-06-19 NOTE — Patient Instructions (Signed)
Your provider has requested that you have an abdominal x ray before leaving today. Please go to the basement floor to our Radiology department for the test.  Your provider has requested that you have an chest x ray before leaving today. Please go to the basement floor to our Radiology department for the test.  Your provider has requested that you go to the basement level for lab work before leaving today. Press "B" on the elevator. The lab is located at the first door on the left as you exit the elevator.  We have sent the following medications to your pharmacy for you to pick up at your convenience: Carafate   You have been scheduled for a CT scan of the abdomen and pelvis at [facility], 1st floor Radiology. You are scheduled on 07/01/21  at 12:30 pm . You should arrive 15 minutes prior to your appointment time for registration.   The solution may taste better if refrigerated, but do NOT add ice or any other liquid to this solution. Shake well before drinking.   Please follow the written instructions below on the day of your exam:   1) Do not eat anything after 8:30am (4 hours prior to your test)   2) Drink 1 bottle of contrast @ 10:30am (2 hours prior to your exam)  Remember to shake well before drinking and do NOT pour over ice.     Drink 1 bottle of contrast @ 11:30am (1 hour prior to your exam)   You may take any medications as prescribed with a small amount of water, if necessary. If you take any of the following medications: METFORMIN, GLUCOPHAGE, GLUCOVANCE, AVANDAMET, RIOMET, FORTAMET, [place] MET, JANUMET, GLUMETZA or METAGLIP, you MAY be asked to HOLD this medication 48 hours AFTER the exam.   The purpose of you drinking the oral contrast is to aid in the visualization of your intestinal tract. The contrast solution may cause some diarrhea. Depending on your individual set of symptoms, you may also receive an intravenous injection of x-ray contrast/dye. Plan on being at  [facility] for 45 minutes or longer, depending on the type of exam you are having performed.   If you have any questions regarding your exam or if you need to reschedule, you may call [facility] at [phone] between the hours of 8:00 am and 5:00 pm, Monday-Friday.   You have been scheduled for an endoscopy. Please follow written instructions given to you at your visit today. If you use inhalers (even only as needed), please bring them with you on the day of your procedure.   Thank you for choosing me and [facility].  Dr. Rush Landmark

## 2021-06-20 ENCOUNTER — Encounter: Payer: Self-pay | Admitting: Gastroenterology

## 2021-06-20 NOTE — Progress Notes (Addendum)
GASTROENTEROLOGY OUTPATIENT CLINIC VISIT   Primary Care Provider Antonietta Jewel, MD [address], [place]. [address] [place] [address] [phone]  Primary GI provider Dr. Ardis Hughs  Patient Profile: Jerome Adams is a 60 y.o. 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  The patient presents to the [facility] for an evaluation and management of problem(s) noted below:  Problem List 1. Unintentional weight loss   2. Anorexia   3. Ampullary adenoma   4. Generalized abdominal pain   5. Bloating symptom   6. Cough, unspecified type   7. Dyspnea, unspecified type     History of Present Illness Please see prior notes by PA Lemmon's and Dr. Ardis Hughs and myself for full details of HPI.  Interval History It has been nearly a year since we last saw the patient.  He was supposed to be scheduled this fall for an ERCP follow-up and he is pending that being scheduled after today's clinic.  The patient on his last ERCP in November had evidence of ampullary tissue without dysplasia.  At the time of his pancreas stent removal in January of this year, things were stable.  The patient however has continued to experience his chronic abdominal pain.  He is also had some progressive anorexia as well as some unintentional weight loss over the course of the last year.  He is not taking his PPI twice daily but just once daily.  He does not recall being on Carafate therapy in the past.   The patient denies any issues with jaundice, scleral icterus, generalized pruritus, darkened/amber urine, clay-colored stools, LE edema, hematemesis, coffee-ground emesis, abdominal distention, confusion.he is worried that we are missing something with his persistent symptoms.  GI Review of  Systems Positive as above Negative for odynophagia, dysphagia, alteration of bowel habits, melena, hematochezia   Review of Systems General: Denies fevers/chills Cardiovascular: Denies chest pain Pulmonary: Positive for issues of shortness of breath without cough for which he is pending a chest x-ray being performed by his PCP (he forgot the order) Gastroenterological: See HPI Genitourinary: Denies darkened urine Hematological: Denies easy bruising/bleeding Dermatological: Denies jaundice Psychological: Mood is stable though hopeful that we can help with his chronic pain that he is experiencing   Medications Current Outpatient Medications  Medication Sig Dispense Refill   aspirin EC 81 MG EC tablet Take 1 tablet (81 mg total) by mouth daily. 30 tablet 0   ENTRESTO 97-103 MG Take 1 tablet by mouth twice daily. **Please schedule an appointment for further refills.** 60 tablet 0   gemfibrozil (LOPID) 600 MG tablet Take 1,200 mg by mouth daily.     metoprolol succinate (TOPROL-XL) 50 MG 24 hr tablet Take 1 tablet by mouth daily at 12 noon. Take with or immediately following a meal.  **Dose change, discontinue spironolactone.** 90 tablet 0   Multiple Vitamin (MULTIVITAMIN WITH MINERALS) TABS tablet Take 1 tablet by mouth daily.     omega-3 acid ethyl esters (LOVAZA) 1 G capsule Take 1 capsule (1 g total) by mouth 2 (two) times daily. (Patient taking differently: Take 2 g by mouth daily.) 60 capsule 5   omeprazole (PRILOSEC) 40 MG capsule Take 1 capsule (40 mg total) by mouth 2 (two) times daily before a meal. (Patient taking differently: Take 40 mg by mouth daily. Patient takes once daily.) 60 capsule 4  oxycodone (ROXICODONE) 30 MG immediate release tablet Take 30 mg by mouth 4 (four) times daily as needed for pain.   0   rosuvastatin (CRESTOR) 40 MG tablet Take 40 mg by mouth daily.     sertraline (ZOLOFT) 100 MG tablet Take 100 mg by mouth at bedtime.      sucralfate (CARAFATE) 1 GM/10ML  suspension Take 10 mLs (1 g total) by mouth 4 (four) times daily. 420 mL 1   torsemide (DEMADEX) 20 MG tablet Take 2 tablets by mouth daily. **Need an appointment.** 180 tablet 0   No current facility-administered medications for this visit.    Allergies Allergies  Allergen Reactions   Hydrocodone-Acetaminophen Itching    Histories Past Medical History:  Diagnosis Date   Alcohol abuse    Arthritis    "joints" (03/30/2013)   CHF (congestive heart failure) (HCC)    Chronic lower back pain    Chronic pancreatitis ([place])    CKD (chronic kidney disease), stage II    [facility], stage II   COPD (chronic obstructive pulmonary disease) ([place]) 07/31/2014   Depression    Drug abuse ([place])    a. Remote drug abuse (cocaine). UDS 07/2014 neg for cocaine.   Essential hypertension    GERD (gastroesophageal reflux disease)    takes  otc   Hearing loss    decreased hearing in right ear   Hepatitis C 2010   a. never treated - undectable virus 12/2013.   Hyperlipidemia    Hypertriglyceridemia    NICM (nonischemic cardiomyopathy) ([place])    a. 12/2007 Echo: EF 45-50%;  b. 07/2014 Echo: EF 20-25%, diff HK, mod dil LA, sev dil RA, mild TR, PASP 31 mmHg.   PUD (peptic ulcer disease)    Sleep apnea    was tested here at [facility]....does not use cpap   Spinal cord stimulator status    has had for 1 yr--inserted by [facility]   Tobacco abuse    Transaminitis    Past Surgical History:  Procedure Laterality Date   ANTERIOR CERVICAL DECOMP/DISCECTOMY FUSION  04/05/2012   Procedure: ANTERIOR CERVICAL DECOMPRESSION/DISCECTOMY FUSION 1 LEVEL;  Surgeon: Elaina Hoops, MD;  Location: [place] [facility];  Service: Neurosurgery;  Laterality: N/A;  Cervical five-six anterior cervical decompression and fusion   BILIARY DILATION  04/16/2020   Procedure: BILIARY DILATION;  Surgeon: Rush Landmark Telford Nab., MD;  Location: [place];  Service: Gastroenterology;;   BILIARY STENT PLACEMENT  06/20/2019   Procedure: BILIARY  STENT PLACEMENT;  Surgeon: Irving Copas., MD;  Location: [place];  Service: Gastroenterology;;   BILIARY STENT PLACEMENT N/A 09/21/2019   Procedure: BILIARY STENT PLACEMENT;  Surgeon: Irving Copas., MD;  Location[facility];  Service: Gastroenterology;  Laterality: N/A;   BILIARY STENT PLACEMENT N/A 06/11/2020   Procedure: BILIARY STENT PLACEMENT;  Surgeon: Rush Landmark Telford Nab., MD;  Location: [facility];  Service: Gastroenterology;  Laterality: N/A;   BIOPSY  03/31/2019   Procedure: BIOPSY;  Surgeon: Milus Banister, MD;  [facility];  Service: Endoscopy;;   BIOPSY  09/21/2019   Procedure: BIOPSY;  Surgeon: Irving Copas., MD;  Location: [facility];  Service: Gastroenterology;;   BIOPSY  04/16/2020   Procedure: BIOPSY;  Surgeon: Irving Copas., MD;  Location: [place];  Service: Gastroenterology;;   BIOPSY  06/11/2020   Procedure: BIOPSY;  Surgeon: Irving Copas., MD;  [facility];  Service: Gastroenterology;;   COLONOSCOPY WITH PROPOFOL N/A 08/28/2016   Procedure: COLONOSCOPY WITH  PROPOFOL;  Surgeon: Milus Banister, MD;  Location: [facility];  Service: Endoscopy;  Laterality: N/A;   ENDOSCOPIC MUCOSAL RESECTION  06/20/2019   Procedure: ENDOSCOPIC MUCOSAL RESECTION;  Surgeon: Rush Landmark, Telford Nab., MD;  Location[facility];  Service: Gastroenterology;;   ENDOSCOPIC RETROGRADE CHOLANGIOPANCREATOGRAPHY (ERCP) WITH PROPOFOL N/A 09/21/2019   Procedure: ENDOSCOPIC RETROGRADE CHOLANGIOPANCREATOGRAPHY (ERCP) WITH PROPOFOL;  Surgeon: Irving Copas., MD;  [facility];  Service: Gastroenterology;  Laterality: N/A;   ENDOSCOPIC RETROGRADE CHOLANGIOPANCREATOGRAPHY (ERCP) WITH PROPOFOL N/A 04/16/2020   Procedure: ENDOSCOPIC RETROGRADE CHOLANGIOPANCREATOGRAPHY (ERCP) WITH PROPOFOL;  Surgeon: Rush Landmark Telford Nab., MD;  Location: [place];  Service: Gastroenterology;  Laterality: N/A;   ERCP N/A  06/20/2019   Procedure: ENDOSCOPIC RETROGRADE CHOLANGIOPANCREATOGRAPHY (ERCP);  Surgeon: Irving Copas., MD;  Location: [place];  Service: Gastroenterology;  Laterality: N/A;   ERCP N/A 06/11/2020   Procedure: ENDOSCOPIC RETROGRADE CHOLANGIOPANCREATOGRAPHY (ERCP);  Surgeon: Irving Copas., MD;  Location: [facility];  Service: Gastroenterology;  Laterality: N/A;   ESOPHAGOGASTRODUODENOSCOPY (EGD) WITH PROPOFOL N/A 08/28/2016   Procedure: ESOPHAGOGASTRODUODENOSCOPY (EGD) WITH PROPOFOL;  Surgeon: Milus Banister, MD;  [facility];  Service: Endoscopy;  Laterality: N/A;   ESOPHAGOGASTRODUODENOSCOPY (EGD) WITH PROPOFOL N/A 03/31/2019   Procedure: ESOPHAGOGASTRODUODENOSCOPY (EGD) WITH PROPOFOL;  Surgeon: Milus Banister, MD;  [facility];  Service: Endoscopy;  Laterality: N/A;   ESOPHAGOGASTRODUODENOSCOPY (EGD) WITH PROPOFOL N/A 06/20/2019   Procedure: ESOPHAGOGASTRODUODENOSCOPY (EGD) WITH PROPOFOL;  Surgeon: Rush Landmark Telford Nab., MD;  Location: [place];  Service: Gastroenterology;  Laterality: N/A;   ESOPHAGOGASTRODUODENOSCOPY (EGD) WITH PROPOFOL N/A 09/21/2019   Procedure: ESOPHAGOGASTRODUODENOSCOPY (EGD) WITH PROPOFOL;  Surgeon: Rush Landmark Telford Nab., MD;  [facility];  Service: Gastroenterology;  Laterality: N/A;   ESOPHAGOGASTRODUODENOSCOPY (EGD) WITH PROPOFOL N/A 07/30/2020   Procedure: ESOPHAGOGASTRODUODENOSCOPY (EGD) WITH PROPOFOL;  Surgeon: Rush Landmark Telford Nab., MD;  Location: [place];  Service: Gastroenterology;  Laterality: N/A;   EUS N/A 03/31/2019   Procedure: UPPER ENDOSCOPIC ULTRASOUND (EUS) RADIAL;  Surgeon: Milus Banister, MD;  [facility];  Service: Endoscopy;  Laterality: N/A;   FOOT SURGERY Right 2012?   "took piece off that was hurting me" (03/30/2013)   HARDWARE REMOVAL     "took screw out of my back; it had damaged my nerve" (03/30/2013)--lumber   HEMOSTASIS CLIP PLACEMENT  06/20/2019   Procedure:  HEMOSTASIS CLIP PLACEMENT;  Surgeon: Irving Copas., MD;  Location: [place];  Service: Gastroenterology;;   INCISION AND DRAINAGE ABSCESS Left    "lower jaw, tooth abscess" (03/30/2013)   LEFT HEART CATHETERIZATION WITH CORONARY ANGIOGRAM N/A 08/03/2014   Procedure: LEFT HEART CATHETERIZATION WITH CORONARY ANGIOGRAM;  Surgeon: Blane Ohara, MD;  Location: [facility];  Service: Cardiovascular;  Laterality: N/A;   PANCREATIC STENT PLACEMENT  06/20/2019   Procedure: PANCREATIC STENT PLACEMENT;  Surgeon: Rush Landmark Telford Nab., MD;  Location: [place];  Service: Gastroenterology;;   PANCREATIC STENT PLACEMENT  09/21/2019   Procedure: PANCREATIC STENT PLACEMENT;  Surgeon: Irving Copas., MD;  Location: [facility];  Service: Gastroenterology;;   PANCREATIC STENT PLACEMENT  06/11/2020   Procedure: PANCREATIC STENT PLACEMENT;  Surgeon: Irving Copas., MD;  Location: [facility];  Service: Gastroenterology;;   POLYPECTOMY  06/20/2019   Procedure: POLYPECTOMY;  Surgeon: Irving Copas., MD;  Location: [place];  Service: Gastroenterology;;   POSTERIOR LUMBAR FUSION     "L4-5" (03/31/2103)   REMOVAL OF STONES  09/21/2019   Procedure: REMOVAL OF STONES;  Surgeon: Irving Copas., MD;  Location:  [facility];  Service: Gastroenterology;;   REMOVAL OF STONES  04/16/2020   Procedure: REMOVAL OF STONES;  Surgeon: Rush Landmark Telford Nab., MD;  Location: [place];  Service: Gastroenterology;;   Clide Deutscher  06/11/2020   Procedure: Clide Deutscher;  Surgeon: Mansouraty, Telford Nab., MD;  Location: [facility];  Service: Gastroenterology;;   Joan Mayans  06/20/2019   Procedure: Joan Mayans;  Surgeon: Mansouraty, Telford Nab., MD;  Location: [place];  Service: Gastroenterology;;   SPINAL CORD STIMULATOR IMPLANT  11/14/2012   STENT REMOVAL  09/21/2019   Procedure: STENT REMOVAL;  Surgeon: Irving Copas., MD;  Location: [facility];   Service: Gastroenterology;;   Lavell Islam REMOVAL  04/16/2020   Procedure: STENT REMOVAL;  Surgeon: Irving Copas., MD;  Location: [place];  Service: Gastroenterology;;   Lavell Islam REMOVAL  07/30/2020   Procedure: STENT REMOVAL;  Surgeon: Irving Copas., MD;  Location: [place];  Service: Gastroenterology;;  pancreatic   SUBMUCOSAL LIFTING INJECTION  06/20/2019   Procedure: SUBMUCOSAL LIFTING INJECTION;  Surgeon: Irving Copas., MD;  Location: [place];  Service: Gastroenterology;;   UPPER ESOPHAGEAL ENDOSCOPIC ULTRASOUND (EUS) N/A 06/20/2019   Procedure: UPPER ESOPHAGEAL ENDOSCOPIC ULTRASOUND (EUS);  Surgeon: Irving Copas., MD;  Location: [place];  Service: Gastroenterology;  Laterality: N/A;   WRIST SURGERY Right 1980's   repair of tendons and nerves   Social History   Socioeconomic History   Marital status: Divorced    Spouse name: Not on file   Number of children: 1   Years of education: Not on file   Highest education level: Not on file  Occupational History   Occupation: UNEMPLOYEED    Employer: UNEMPLOYED    Comment: on disability.   Tobacco Use   Smoking status: Some Days    Packs/day: 1.00    Years: 40.00    Pack years: 40.00    Types: Cigarettes   Smokeless tobacco: Never  Vaping Use   Vaping Use: Never used  Substance and Sexual Activity   Alcohol use: Not Currently    Comment: stopped 2019   Drug use: No    Types: Cocaine    Comment: 03/30/2013 "[place]"   Sexual activity: Not on file  Other Topics Concern   Not on file  Social History Narrative   Lives in [place] by himself.  Does not routinely exercise.   Social Determinants of Health   Financial Resource Strain: Not on file  Food Insecurity: Not on file  Transportation Needs: Not on file  Physical Activity: Not on file  Stress: Not on file  Social Connections: Not on file  Intimate Partner Violence: Not on file   Family History  Problem Relation Age of  Onset   Heart disease Father        H/o MI followed by SCD @ 11.   Heart disease Sister        Died suddenly @ 53 - h/o CHF s/p AICD.   Breast cancer Sister    Bone cancer Mother    Esophageal cancer Neg Hx    Stomach cancer Neg Hx    Colon cancer Neg Hx    Inflammatory bowel disease Neg Hx    Liver disease Neg Hx    Pancreatic cancer Neg Hx    Rectal cancer Neg Hx    I have reviewed his medical, social, and family history in detail and updated the electronic medical record as necessary.    PHYSICAL EXAMINATION  BP 100/64   Pulse 60  Ht '5\' 10"'  (1.778 m)   Wt 208 lb 4 oz (94.5 kg)   BMI 29.88 kg/m  Wt Readings from Last 3 Encounters:  06/19/21 208 lb 4 oz (94.5 kg)  07/30/20 218 lb (98.9 kg)  06/11/20 213 lb (96.6 kg)  GEN: NAD, appears stated age, doesn't appear chronically ill, has lost weight based on how he looks today PSYCH: Cooperative, without pressured speech EYE: Conjunctivae pink, sclerae anicteric ENT: Masked CV: Nontachycardic RESP: No audible wheezing GI: NABS, soft, obese, rounded, protuberant, tenderness to palpation in midepigastrium, no rebound  MSK/EXT: No lower extremity edema SKIN: No jaundice, no spider angiomata NEURO:  Alert & Oriented x 3, no focal deficits, no evidence of asterixis   REVIEW OF DATA  I reviewed the following data at the time of this encounter:  GI Procedures and Studies  November 2021 ERCP - The major papilla appeared edematous and congested and consistent with recurrent adenoma (as previously biopsied). Both pancreatic and biliary sphincterotomy sites were open. - The biliary tree was swept and nothing was found. - The ampulla was injected with Epinephrine to aid in hemostasis. - Fulguration with hot biopsy forceps as an Avulsion technique was performed. - One temporary plastic pancreatic stent was placed into the ventral pancreatic duct. - One plastic biliary stent was placed into the common bile duct.  Pathology FINAL  MICROSCOPIC DIAGNOSIS:  A. AMPULLA AVULSION, BIOPSY:  - Ampullary mucosa with thermal artifact  - Negative for dysplasia or malignancy  January 2022 EGD - No gross lesions in the stomach. - Plastic biliary pancreatic stents in the duodenum. Removed.  Laboratory Studies  Reviewed those in epic  Imaging Studies  Last cross-sectional imaging was in November 2020 IMPRESSION: 1. Suggestion of a homogeneous enhancing 1.5 x 1.2 cm soft tissue mass at the ampulla, compatible with the reported history of ampullary neoplasm. 2. Dilated CBD (12 mm diameter). Mild diffuse intrahepatic biliary ductal dilatation. Mild ventral pancreatic duct dilation in the pancreatic head. 3. Diffuse hepatic steatosis. 4.  Aortic Atherosclerosis (ICD10-I70.0).   ASSESSMENT  Mr. Eugene is a 60 y.o. 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  The patient is seen today for evaluation and management of:  1. Unintentional weight loss   2. Anorexia   3. Ampullary adenoma   4. Generalized abdominal pain   5. Bloating symptom   6. Cough, unspecified type   7. Dyspnea, unspecified type    The patient remains hemodynamically stable.  Clinically, he has had persistent issues with generalized abdominal pain as well as unintentional weight loss and anorexia that has developed.  We have not had cross-sectional imaging updated in the last few years and I think it is reasonable to rule out that anything else is occurring at this time.  We will check laboratories to evaluate for any evidence of pancreatitis or abnormalities in the bile duct stent.  He will be scheduled for an ERCP next available but if his laboratories are concerning or his x-ray imaging is concerning, then earlier ERCP will be found.  We did  discuss that if ampullary adenoma recurrence has occurred, we would then need to discuss follow-up at one of the quaternary centers or consideration of surgical ampullectomy by Dr. Barry Dienes has been discussed years ago.  Time will tell.  The risks and benefits of endoscopic evaluation were discussed with the patient;  these include but are not limited to the risk of perforation, infection, bleeding, missed lesions, lack of diagnosis, severe illness requiring hospitalization, as well as anesthesia and sedation related illnesses.  The patient and/or family is agreeable to proceed.  The risks of an ERCP were discussed at length, including but not limited to the risk of perforation, bleeding, abdominal pain, post-ERCP pancreatitis (while usually mild can be severe and even life threatening).   I am going to go ahead and order his chest x-ray which was pending by his PCP for his dyspnea issues and I think it would be reasonable for him to follow-up with his cardiology team as he is already scheduled in December to ensure nothing else is occurring at which point we can proceed with his ERCP in January of this coming year.  All patient questions were answered to the best of my ability, and the patient agrees to the aforementioned plan of action with follow-up as indicated.  I will update his primary gastroenterologist Dr. Ardis Hughs as well.   PLAN  Laboratories as outlined below Fecal elastase testing to be performed Consider SIBO breath testing versus empiric treatment KUB to evaluate biliary stent Chest x-ray to evaluate dyspnea (as was previously ordered by PCP) CT abdomen/pelvis to evaluate unintentional weight loss and anorexia in setting of prior ampullary adenoma status post resection Schedule ERCP - If ampullary adenoma recurrence has occurred will need to consider quaternary center evaluation for surgical ampullectomy Continue once daily PPI Initiate Carafate twice daily    Orders Placed This Encounter   Procedures   Procedural/ Surgical Case Request: ENDOSCOPIC RETROGRADE CHOLANGIOPANCREATOGRAPHY (ERCP) WITH PROPOFOL   DG Abd 2 Views   CT Abdomen Pelvis W Contrast   DG Chest 2 View   CBC   Comp Met (CMET)   Lipase   Pancreatic elastase, fecal   Ambulatory referral to Gastroenterology    New Prescriptions   SUCRALFATE (CARAFATE) 1 GM/10ML SUSPENSION    Take 10 mLs (1 g total) by mouth 4 (four) times daily.   Modified Medications   No medications on file    Planned Follow Up No follow-ups on file.   Total Time in Face-to-Face and in Coordination of Care for patient including independent/personal interpretation/review of prior testing, medical history, examination, medication adjustment, communicating results with the patient directly, and documentation within the EHR is 30 minutes.  Justice Britain, MD [facility] Office # [phone]

## 2021-06-24 ENCOUNTER — Other Ambulatory Visit: Payer: Self-pay

## 2021-06-24 ENCOUNTER — Other Ambulatory Visit: Payer: Medicare Other

## 2021-06-24 DIAGNOSIS — R718 Other abnormality of red blood cells: Secondary | ICD-10-CM

## 2021-06-24 DIAGNOSIS — R0600 Dyspnea, unspecified: Secondary | ICD-10-CM

## 2021-06-24 DIAGNOSIS — R634 Abnormal weight loss: Secondary | ICD-10-CM

## 2021-06-24 DIAGNOSIS — D135 Benign neoplasm of extrahepatic bile ducts: Secondary | ICD-10-CM

## 2021-06-24 DIAGNOSIS — R1084 Generalized abdominal pain: Secondary | ICD-10-CM

## 2021-06-24 DIAGNOSIS — R059 Cough, unspecified: Secondary | ICD-10-CM

## 2021-06-24 DIAGNOSIS — R140 Abdominal distension (gaseous): Secondary | ICD-10-CM | POA: Insufficient documentation

## 2021-06-24 DIAGNOSIS — R630 Anorexia: Secondary | ICD-10-CM

## 2021-07-01 ENCOUNTER — Ambulatory Visit (HOSPITAL_COMMUNITY)
Admission: RE | Admit: 2021-07-01 | Discharge: 2021-07-01 | Disposition: A | Discharge status: Home / Self Care | Payer: Medicare Other | Source: Ambulatory Visit | Attending: Gastroenterology | Admitting: Gastroenterology

## 2021-07-01 ENCOUNTER — Other Ambulatory Visit: Payer: Self-pay

## 2021-07-01 DIAGNOSIS — R059 Cough, unspecified: Secondary | ICD-10-CM | POA: Insufficient documentation

## 2021-07-01 DIAGNOSIS — R1084 Generalized abdominal pain: Secondary | ICD-10-CM | POA: Diagnosis present

## 2021-07-01 DIAGNOSIS — D135 Benign neoplasm of extrahepatic bile ducts: Secondary | ICD-10-CM | POA: Insufficient documentation

## 2021-07-01 DIAGNOSIS — R634 Abnormal weight loss: Secondary | ICD-10-CM | POA: Insufficient documentation

## 2021-07-01 DIAGNOSIS — R630 Anorexia: Secondary | ICD-10-CM | POA: Diagnosis present

## 2021-07-01 DIAGNOSIS — R0600 Dyspnea, unspecified: Secondary | ICD-10-CM | POA: Insufficient documentation

## 2021-07-01 MED ORDER — IOHEXOL 350 MG/ML SOLN
80.0000 mL | Freq: Once | INTRAVENOUS | Status: AC | PRN
  Administered 2021-07-01: 80 mL via INTRAVENOUS

## 2021-07-02 LAB — PANCREATIC ELASTASE, FECAL: Pancreatic Elastase-1, Stool: >500 mcg/g

## 2021-07-03 ENCOUNTER — Other Ambulatory Visit: Payer: Self-pay | Admitting: Cardiology

## 2021-07-19 ENCOUNTER — Ambulatory Visit: Payer: Medicare Other | Admitting: Cardiology

## 2021-07-30 ENCOUNTER — Other Ambulatory Visit: Payer: Self-pay | Admitting: Cardiology

## 2021-08-05 NOTE — Progress Notes (Signed)
Cardiology Office Note   Date:  08/07/2021   ID:  Jerome Adams, DOB October 28, 1960, MRN [id number]  PCP:  Antonietta Jewel, MD  Cardiologist:   None   Chief Complaint  Patient presents with   Cardiomyopathy     History of Present Illness: Jerome Adams is a 61 y.o. 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  At the last visit he did have palpitations and had a monitor that demonstrated NSVT with the longest run being 14 beats.    He has missed follow-up appointments with [place].  He does apparently take him to get his medications.  He has not had any new cardiovascular complaints.  He gets short of breath walking a flight of stairs which is what he was experiencing before.  He has to stop.  He lives on the second floor but he says he is getting ready to move.  He is raising a 53 and a 75-year-old who with his grandchildren.  He has no help with him and he says it is a struggle.  He says they do go to school  5 days a week at his church.   He is not describing PND or orthopnea.  He is not noticing palpitations, presyncope or syncope.  He is not having chest discomfort.  He stopped drinking alcohol a while ago.  He has had problems with pancreatic obstruction and stenting and has seen GI on multiple occasions but he says this has been doing relatively well.  He had unintentional weight loss but he said he has been eating okay and his weight is starting to go up.    Past Medical History:  Diagnosis Date   Alcohol abuse    Arthritis    "joints" (03/30/2013)   CHF (congestive heart failure) (HCC)    Chronic lower back pain    Chronic pancreatitis ([place])    CKD (chronic kidney disease), stage II    [facility], stage II   COPD (chronic obstructive pulmonary disease) ([place]) 07/31/2014   Depression    Drug abuse ([place])    a. Remote drug  abuse (cocaine). UDS 07/2014 neg for cocaine.   Essential hypertension    GERD (gastroesophageal reflux disease)    takes  otc   Hearing loss    decreased hearing in right ear   Hepatitis C 2010   a. never treated - undectable virus 12/2013.   Hyperlipidemia    Hypertriglyceridemia    NICM (nonischemic cardiomyopathy) ([place])    a. 12/2007 Echo: EF 45-50%;  b. 07/2014 Echo: EF 20-25%, diff HK, mod dil LA, sev dil RA, mild TR, PASP 31 mmHg.   PUD (peptic ulcer disease)    Sleep apnea    was tested here at [facility]....does not use cpap   Spinal cord stimulator status    has had for 1 yr--inserted by [facility]   Tobacco abuse    Transaminitis     Past Surgical History:  Procedure Laterality Date   ANTERIOR CERVICAL DECOMP/DISCECTOMY FUSION  04/05/2012   Procedure: ANTERIOR CERVICAL DECOMPRESSION/DISCECTOMY FUSION 1 LEVEL;  Surgeon: Elaina Hoops, MD;  Location: [place] [facility];  Service: Neurosurgery;  Laterality: N/A;  Cervical five-six anterior cervical decompression and fusion   BILIARY DILATION  04/16/2020   Procedure: BILIARY DILATION;  Surgeon: Rush Landmark Telford Nab., MD;  Location: [facility];  Service: Gastroenterology;;   BILIARY STENT PLACEMENT  06/20/2019   Procedure: BILIARY STENT PLACEMENT;  Surgeon: Rush Landmark Telford Nab., MD;  Location: [place];  Service: Gastroenterology;;   BILIARY STENT PLACEMENT N/A 09/21/2019   Procedure: BILIARY STENT PLACEMENT;  Surgeon: Irving Copas., MD;  Location: [facility];  Service: Gastroenterology;  Laterality: N/A;   BILIARY STENT PLACEMENT N/A 06/11/2020   Procedure: BILIARY STENT PLACEMENT;  Surgeon: Rush Landmark Telford Nab., MD;  Location: [facility];  Service: Gastroenterology;  Laterality: N/A;   BIOPSY  03/31/2019   Procedure: BIOPSY;  Surgeon: Milus Banister, MD;  [facility]: [facility];  Service: Endoscopy;;   BIOPSY  09/21/2019   Procedure: BIOPSY;  Surgeon: Irving Copas., MD;  Location: [facility];  Service:  Gastroenterology;;   BIOPSY  04/16/2020   Procedure: BIOPSY;  Surgeon: Irving Copas., MD;  Location: [place];  Service: Gastroenterology;;   BIOPSY  06/11/2020   Procedure: BIOPSY;  Surgeon: Irving Copas., MD;  Location: [facility];  Service: Gastroenterology;;   COLONOSCOPY WITH PROPOFOL N/A 08/28/2016   Procedure: COLONOSCOPY WITH PROPOFOL;  Surgeon: Milus Banister, MD;  [facility];  Service: Endoscopy;  Laterality: N/A;   ENDOSCOPIC MUCOSAL RESECTION  06/20/2019   Procedure: ENDOSCOPIC MUCOSAL RESECTION;  Surgeon: Rush Landmark, Telford Nab., MD;  Location: [facility] ENDOSCOPY;  Service: Gastroenterology;;   ENDOSCOPIC RETROGRADE CHOLANGIOPANCREATOGRAPHY (ERCP) WITH PROPOFOL N/A 09/21/2019   Procedure: ENDOSCOPIC RETROGRADE CHOLANGIOPANCREATOGRAPHY (ERCP) WITH PROPOFOL;  Surgeon: Irving Copas., MD;  [facility];  Service: Gastroenterology;  Laterality: N/A;   ENDOSCOPIC RETROGRADE CHOLANGIOPANCREATOGRAPHY (ERCP) WITH PROPOFOL N/A 04/16/2020   Procedure: ENDOSCOPIC RETROGRADE CHOLANGIOPANCREATOGRAPHY (ERCP) WITH PROPOFOL;  Surgeon: Rush Landmark Telford Nab., MD;  Location: [place];  Service: Gastroenterology;  Laterality: N/A;   ERCP N/A 06/20/2019   Procedure: ENDOSCOPIC RETROGRADE CHOLANGIOPANCREATOGRAPHY (ERCP);  Surgeon: Irving Copas., MD;  Location: [place];  Service: Gastroenterology;  Laterality: N/A;   ERCP N/A 06/11/2020   Procedure: ENDOSCOPIC RETROGRADE CHOLANGIOPANCREATOGRAPHY (ERCP);  Surgeon: Irving Copas., MD;  Location: [facility];  Service: Gastroenterology;  Laterality: N/A;   ESOPHAGOGASTRODUODENOSCOPY (EGD) WITH PROPOFOL N/A 08/28/2016   Procedure: ESOPHAGOGASTRODUODENOSCOPY (EGD) WITH PROPOFOL;  Surgeon: Milus Banister, MD;  [facility];  Service: Endoscopy;  Laterality: N/A;   ESOPHAGOGASTRODUODENOSCOPY (EGD) WITH PROPOFOL N/A 03/31/2019   Procedure: ESOPHAGOGASTRODUODENOSCOPY (EGD) WITH  PROPOFOL;  Surgeon: Milus Banister, MD;  [facility];  Service: Endoscopy;  Laterality: N/A;   ESOPHAGOGASTRODUODENOSCOPY (EGD) WITH PROPOFOL N/A 06/20/2019   Procedure: ESOPHAGOGASTRODUODENOSCOPY (EGD) WITH PROPOFOL;  Surgeon: Rush Landmark Telford Nab., MD;  Location: [place];  Service: Gastroenterology;  Laterality: N/A;   ESOPHAGOGASTRODUODENOSCOPY (EGD) WITH PROPOFOL N/A 09/21/2019   Procedure: ESOPHAGOGASTRODUODENOSCOPY (EGD) WITH PROPOFOL;  Surgeon: Rush Landmark Telford Nab., MD;  [facility];  Service: Gastroenterology;  Laterality: N/A;   ESOPHAGOGASTRODUODENOSCOPY (EGD) WITH PROPOFOL N/A 07/30/2020   Procedure: ESOPHAGOGASTRODUODENOSCOPY (EGD) WITH PROPOFOL;  Surgeon: Rush Landmark Telford Nab., MD;  Location: [place];  Service: Gastroenterology;  Laterality: N/A;   EUS N/A 03/31/2019   Procedure: UPPER ENDOSCOPIC ULTRASOUND (EUS) RADIAL;  Surgeon: Milus Banister, MD;  Location: [facility];  Service: Endoscopy;  Laterality: N/A;   FOOT SURGERY Right 2012?   "took piece off that was hurting me" (03/30/2013)   HARDWARE REMOVAL     "took screw out of my back; it had damaged my nerve" (03/30/2013)--lumber   HEMOSTASIS CLIP PLACEMENT  06/20/2019   Procedure: HEMOSTASIS CLIP PLACEMENT;  Surgeon: Irving Copas., MD;  Location: [place] ENDOSCOPY;  Service: Gastroenterology;;   INCISION AND DRAINAGE ABSCESS Left    "lower jaw, tooth abscess" (03/30/2013)   LEFT HEART CATHETERIZATION WITH CORONARY ANGIOGRAM N/A 08/03/2014   Procedure: LEFT HEART CATHETERIZATION WITH CORONARY ANGIOGRAM;  Surgeon: Blane Ohara, MD;  Location: [facility] CATH LAB;  Service: Cardiovascular;  Laterality: N/A;   PANCREATIC STENT PLACEMENT  06/20/2019   Procedure: PANCREATIC STENT PLACEMENT;  Surgeon: Rush Landmark Telford Nab., MD;  Location: [place];  Service: Gastroenterology;;   PANCREATIC STENT PLACEMENT  09/21/2019   Procedure: PANCREATIC STENT PLACEMENT;  Surgeon: Irving Copas., MD;   Location: [facility];  Service: Gastroenterology;;   PANCREATIC STENT PLACEMENT  06/11/2020   Procedure: PANCREATIC STENT PLACEMENT;  Surgeon: Irving Copas., MD;  Location: [facility];  Service: Gastroenterology;;   POLYPECTOMY  06/20/2019   Procedure: POLYPECTOMY;  Surgeon: Irving Copas., MD;  Location: [place];  Service: Gastroenterology;;   POSTERIOR LUMBAR FUSION     "L4-5" (03/31/2103)   REMOVAL OF STONES  09/21/2019   Procedure: REMOVAL OF STONES;  Surgeon: Irving Copas., MD;  Location: [facility];  Service: Gastroenterology;;   REMOVAL OF STONES  04/16/2020   Procedure: REMOVAL OF STONES;  Surgeon: Irving Copas., MD;  Location: [place];  Service: Gastroenterology;;   Clide Deutscher  06/11/2020   Procedure: Clide Deutscher;  Surgeon: Irving Copas., MD;  Location: [facility];  Service: Gastroenterology;;   Joan Mayans  06/20/2019   Procedure: Joan Mayans;  Surgeon: Irving Copas., MD;  Location: [place];  Service: Gastroenterology;;   SPINAL CORD STIMULATOR IMPLANT  11/14/2012   STENT REMOVAL  09/21/2019   Procedure: STENT REMOVAL;  Surgeon: Irving Copas., MD;  Location: [facility];  Service: Gastroenterology;;   Lavell Islam REMOVAL  04/16/2020   Procedure: STENT REMOVAL;  Surgeon: Irving Copas., MD;  Location: [place];  Service: Gastroenterology;;   Lavell Islam REMOVAL  07/30/2020   Procedure: STENT REMOVAL;  Surgeon: Irving Copas., MD;  Location: [place];  Service: Gastroenterology;;  pancreatic   SUBMUCOSAL LIFTING INJECTION  06/20/2019   Procedure: SUBMUCOSAL LIFTING INJECTION;  Surgeon: Irving Copas., MD;  Location: [place];  Service: Gastroenterology;;   UPPER ESOPHAGEAL ENDOSCOPIC ULTRASOUND (EUS) N/A 06/20/2019   Procedure: UPPER ESOPHAGEAL ENDOSCOPIC ULTRASOUND (EUS);  Surgeon: Irving Copas., MD;  Location: [place];  Service: Gastroenterology;   Laterality: N/A;   WRIST SURGERY Right 1980's   repair of tendons and nerves     Current Outpatient Medications  Medication Sig Dispense Refill   aspirin EC 81 MG EC tablet Take 1 tablet (81 mg total) by mouth daily. 30 tablet 0   gabapentin (NEURONTIN) 300 MG capsule Take 300 mg by mouth 2 (two) times daily.     meloxicam (MOBIC) 15 MG tablet Take 15 mg by mouth daily.     metoprolol succinate (TOPROL-XL) 50 MG 24 hr tablet Take 1 tablet by mouth daily at 12 noon. Take with or immediately following a meal.  **Dose change, discontinue spironolactone.** 90 tablet 0   Multiple Vitamin (MULTIVITAMIN WITH MINERALS) TABS tablet Take 1 tablet by mouth daily.     omega-3 acid ethyl esters (LOVAZA) 1 G capsule Take 1 capsule (1 g total) by mouth 2 (two) times daily. (Patient taking differently: Take 2 g by mouth daily.) 60 capsule 5   omeprazole (PRILOSEC) 40 MG capsule Take 1 capsule (40 mg total) by mouth 2 (two) times daily before a meal. (Patient taking differently: Take 40 mg by mouth  daily. Patient takes once daily.) 60 capsule 4   oxycodone (ROXICODONE) 30 MG immediate release tablet Take 30 mg by mouth 4 (four) times daily as needed for pain.   0   rosuvastatin (CRESTOR) 40 MG tablet Take 40 mg by mouth daily.     sacubitril-valsartan (ENTRESTO) 97-103 MG Take 1 tablet by mouth 2 (two) times daily. Keep appointment for future refills. 90 tablet 0   sertraline (ZOLOFT) 100 MG tablet Take 100 mg by mouth at bedtime.      sucralfate (CARAFATE) 1 GM/10ML suspension Take 10 mLs (1 g total) by mouth 4 (four) times daily. 420 mL 1   torsemide (DEMADEX) 20 MG tablet Take 1 tablet (20 mg total) by mouth once for 1 dose. 180 tablet 3   No current facility-administered medications for this visit.    Allergies:   Hydrocodone-acetaminophen    ROS:  Please see the history of present illness.   Otherwise, review of systems are positive for none.   All other systems are reviewed and negative.     PHYSICAL EXAM: VS:  BP 112/70 (BP Location: Left Arm, Patient Position: Sitting)    Pulse (!) 50    Ht 5\' 10"  (1.778 m)    Wt 216 lb (98 kg)    SpO2 99%    BMI 30.99 kg/m  , BMI Body mass index is 30.99 kg/m. GENERAL:  Well appearing NECK:  No jugular venous distention, waveform within normal limits, carotid upstroke brisk and symmetric, no bruits, no thyromegaly LUNGS:  Clear to auscultation bilaterally CHEST:  Unremarkable HEART:  PMI not displaced or sustained,S1 and S2 within normal limits, no S3, no S4, no clicks, no rubs, no murmurs ABD:  Flat, positive bowel sounds normal in frequency in pitch, no bruits, no rebound, no guarding, no midline pulsatile mass, no hepatomegaly, no splenomegaly EXT:  2 plus pulses throughout, no edema, no cyanosis no clubbing    EKG:  EKG is  ordered today. The ekg ordered today demonstrates sinus bradycardia, rate 50, first-degree AV block, inferolateral and anterolateral T wave inversions unchanged from previous.   Recent Labs: 06/19/2021: ALT 12; BUN 15; Creatinine, Ser 1.15; Hemoglobin 13.3; Platelets 166.0; Potassium 3.6; Sodium 139    Lipid Panel    Component Value Date/Time   CHOL 329 (H) 03/29/2013 1238   TRIG 410 (H) 05/26/2013 1443   HDL 21 (L) 03/29/2013 1238   CHOLHDL 15.7 03/29/2013 1238   VLDL NOT CALC 03/29/2013 1238   LDLCALC NOT CALC 03/29/2013 1238   LDLDIRECT 89 12/09/2012 1225      Wt Readings from Last 3 Encounters:  08/07/21 216 lb (98 kg)  06/19/21 208 lb 4 oz (94.5 kg)  07/30/20 218 lb (98.9 kg)      Other studies Reviewed: Additional studies/ records that were reviewed today include: GI records, labs Review of the above records demonstrates:  Please see elsewhere in the note.     ASSESSMENT AND PLAN:  Nonischemic cardiomyopathy:     For now I am going to continue the medicines as listed.  I am going to check an echocardiogram.  I am going to we need to avoid up titration of his beta-blocker.  He can  continue the other medicines however.   He has not tolerated med titration because of lightheadedness previously.   NSVT: He has not had new symptoms.  No change in therapy.     Current medicines are reviewed at length with the patient today.  The patient  does not have concerns regarding medicines.  The following changes have been made: None  Labs/ tests ordered today include:  None  Orders Placed This Encounter  Procedures   EKG 12-Lead   ECHOCARDIOGRAM COMPLETE     Disposition:   FU with APP in six months.   Signed, Minus Breeding, MD  08/07/2021 1:39 PM    [facility]

## 2021-08-07 ENCOUNTER — Encounter: Payer: Self-pay | Admitting: Cardiology

## 2021-08-07 ENCOUNTER — Ambulatory Visit (INDEPENDENT_AMBULATORY_CARE_PROVIDER_SITE_OTHER): Payer: Commercial Managed Care - HMO | Admitting: Cardiology

## 2021-08-07 ENCOUNTER — Other Ambulatory Visit: Payer: Self-pay

## 2021-08-07 VITALS — BP 112/70 | HR 50 | Ht 70.0 in | Wt 216.0 lb

## 2021-08-07 DIAGNOSIS — I428 Other cardiomyopathies: Secondary | ICD-10-CM | POA: Diagnosis not present

## 2021-08-07 DIAGNOSIS — Z720 Tobacco use: Secondary | ICD-10-CM | POA: Diagnosis not present

## 2021-08-07 DIAGNOSIS — R002 Palpitations: Secondary | ICD-10-CM

## 2021-08-07 DIAGNOSIS — E785 Hyperlipidemia, unspecified: Secondary | ICD-10-CM

## 2021-08-07 NOTE — Patient Instructions (Signed)
Medication Instructions:  NO CHANGE *If you need a refill on your cardiac medications before your next appointment, please call your pharmacy*   Testing/Procedures: Your physician has requested that you have an echocardiogram. Echocardiography is a painless test that uses sound waves to create images of your heart. It provides your doctor with information about the size and shape of your heart and how well your hearts chambers and valves are working. This procedure takes approximately one hour. There are no restrictions for this procedure. -- [address] 3[address] Floor - [place]   Follow-Up: At [facility], you and your health needs are our priority.  As part of our continuing mission to provide you with exceptional heart care, we have created designated Provider Care Teams.  These Care Teams include your primary Cardiologist (physician) and Advanced Practice Providers (APPs -  Physician Assistants and Nurse Practitioners) who all work together to provide you with the care you need, when you need it.  We recommend signing up for the patient portal called "MyChart".  Sign up information is provided on this After Visit Summary.  MyChart is used to connect with patients for Virtual Visits (Telemedicine).  Patients are able to view lab/test results, encounter notes, upcoming appointments, etc.  Non-urgent messages can be sent to your provider as well.   To learn more about what you can do with MyChart, go to [email or web address].    Your next appointment:   6 months with Coletta Memos NP

## 2021-08-08 ENCOUNTER — Other Ambulatory Visit: Payer: Self-pay | Admitting: Cardiology

## 2021-08-08 MED ORDER — TORSEMIDE 20 MG PO TABS: 20.0000 mg | ORAL_TABLET | Freq: Every day | ORAL | 3 refills | Status: DC

## 2021-08-08 NOTE — Telephone Encounter (Signed)
Refill sent to the pharmacy electronically.  

## 2021-08-11 ENCOUNTER — Encounter (HOSPITAL_COMMUNITY): Payer: Self-pay | Admitting: Gastroenterology

## 2021-08-12 ENCOUNTER — Ambulatory Visit (HOSPITAL_COMMUNITY): Payer: Medicare Other | Attending: Cardiology

## 2021-08-12 ENCOUNTER — Other Ambulatory Visit: Payer: Self-pay

## 2021-08-12 DIAGNOSIS — I428 Other cardiomyopathies: Secondary | ICD-10-CM | POA: Diagnosis present

## 2021-08-12 LAB — ECHOCARDIOGRAM COMPLETE
Area-P 1/2: 3.39 cm2
S' Lateral: 4.5 cm

## 2021-08-19 ENCOUNTER — Telehealth: Payer: Self-pay | Admitting: Gastroenterology

## 2021-08-19 NOTE — Telephone Encounter (Signed)
Inbound call from patient states he need to discuss pain medication. Did not want to go into detail and only want to speak with Dr. Rush Landmark. Advised patient I would message to the nurse and they will reach out/

## 2021-08-19 NOTE — Telephone Encounter (Signed)
Unable to reach pt & was not able to leave a vm line rang busy.

## 2021-08-20 ENCOUNTER — Telehealth: Payer: Self-pay | Admitting: Gastroenterology

## 2021-08-20 NOTE — Telephone Encounter (Signed)
Patient called and stated that when he touches the top of his stomach it is hurting and he is bloated. Patient is scheduled to have procedure tomorrow at [facility]. Please advise.

## 2021-08-20 NOTE — Telephone Encounter (Signed)
Unable to reach pt and unable to leave vm, line rings busy.

## 2021-08-20 NOTE — Telephone Encounter (Signed)
Pt called in complaining of ongoing abdominal pain. He asked for refills on his oxycodone tablets for QID.  Is there an alternative pain medication or other recommendations that you have for this pt? He is scheduled with you for an ERCP tomorrow 08/21/21.

## 2021-08-20 NOTE — Anesthesia Preprocedure Evaluation (Addendum)
Anesthesia Evaluation  Patient identified by MRN, date of birth, ID band Patient awake    Reviewed: Allergy & Precautions, NPO status , Patient's Chart, lab work & pertinent test results, reviewed documented beta blocker date and time   History of Anesthesia Complications Negative for: history of anesthetic complications  Airway Mallampati: II  TM Distance: >3 FB Neck ROM: Full    Dental  (+)    Pulmonary sleep apnea , COPD, Current Smoker,    Pulmonary exam normal        Cardiovascular hypertension, Pt. on medications and Pt. on home beta blockers + CAD and +CHF  Normal cardiovascular exam  TTE 08/12/21: EF 40-45%, mild LVH, moderate LAE, dilatation of the aortic root measuring 40 mm, cannot exclude a small PFO.   Neuro/Psych Depression Chronic back pain, on oxycodone 30mg  QID, has spinal cord stimulator    GI/Hepatic PUD, GERD  Medicated and Controlled,(+) Hepatitis -, C  Endo/Other  negative endocrine ROS  Renal/GU Renal InsufficiencyRenal disease  negative genitourinary   Musculoskeletal  (+) Arthritis ,   Abdominal   Peds  Hematology negative hematology ROS (+)   Anesthesia Other Findings   Reproductive/Obstetrics negative OB ROS                            Anesthesia Physical Anesthesia Plan  ASA: 3  Anesthesia Plan: General   Post-op Pain Management: Minimal or no pain anticipated   Induction: Intravenous  PONV Risk Score and Plan: 2 and Treatment may vary due to age or medical condition, Ondansetron, Dexamethasone and Midazolam  Airway Management Planned: Oral ETT  Additional Equipment: None  Intra-op Plan:   Post-operative Plan: Extubation in OR  Informed Consent: I have reviewed the patients History and Physical, chart, labs and discussed the procedure including the risks, benefits and alternatives for the proposed anesthesia with the patient or authorized  representative who has indicated his/her understanding and acceptance.     Dental advisory given  Plan Discussed with: CRNA  Anesthesia Plan Comments:        Anesthesia Quick Evaluation

## 2021-08-20 NOTE — Telephone Encounter (Signed)
Tylenol 500 to 650 mg every 8 hour as needed. Ibuprofen 600 mg every 8 hour as needed. Stagger these. I have discussed with him previously that we would not be planning long-term use of any opioid therapy. I will discuss things further tomorrow with him at time of his procedure. Thanks. GM

## 2021-08-20 NOTE — Telephone Encounter (Signed)
See alternate 1/24 phone note.

## 2021-08-21 ENCOUNTER — Ambulatory Visit (HOSPITAL_COMMUNITY): Payer: Medicare Other

## 2021-08-21 ENCOUNTER — Encounter (HOSPITAL_COMMUNITY): Payer: Self-pay | Admitting: Gastroenterology

## 2021-08-21 ENCOUNTER — Ambulatory Visit (HOSPITAL_COMMUNITY): Payer: Medicare Other | Admitting: Anesthesiology

## 2021-08-21 ENCOUNTER — Ambulatory Visit (HOSPITAL_COMMUNITY)
Admission: RE | Admit: 2021-08-21 | Discharge: 2021-08-21 | Disposition: A | Discharge status: Home / Self Care | Payer: Medicare Other | Attending: Gastroenterology | Admitting: Gastroenterology

## 2021-08-21 ENCOUNTER — Encounter (HOSPITAL_COMMUNITY)
Admission: RE | Disposition: A | Discharge status: Home / Self Care | Payer: Self-pay | Source: Home / Self Care | Attending: Gastroenterology

## 2021-08-21 ENCOUNTER — Other Ambulatory Visit: Payer: Self-pay

## 2021-08-21 ENCOUNTER — Telehealth: Payer: Self-pay

## 2021-08-21 ENCOUNTER — Telehealth: Payer: Self-pay | Admitting: Cardiology

## 2021-08-21 DIAGNOSIS — R0600 Dyspnea, unspecified: Secondary | ICD-10-CM

## 2021-08-21 DIAGNOSIS — R1084 Generalized abdominal pain: Secondary | ICD-10-CM | POA: Insufficient documentation

## 2021-08-21 DIAGNOSIS — I130 Hypertensive heart and chronic kidney disease with heart failure and stage 1 through stage 4 chronic kidney disease, or unspecified chronic kidney disease: Secondary | ICD-10-CM | POA: Diagnosis not present

## 2021-08-21 DIAGNOSIS — K219 Gastro-esophageal reflux disease without esophagitis: Secondary | ICD-10-CM | POA: Insufficient documentation

## 2021-08-21 DIAGNOSIS — F17210 Nicotine dependence, cigarettes, uncomplicated: Secondary | ICD-10-CM | POA: Insufficient documentation

## 2021-08-21 DIAGNOSIS — I2510 Atherosclerotic heart disease of native coronary artery without angina pectoris: Secondary | ICD-10-CM | POA: Insufficient documentation

## 2021-08-21 DIAGNOSIS — R059 Cough, unspecified: Secondary | ICD-10-CM

## 2021-08-21 DIAGNOSIS — K861 Other chronic pancreatitis: Secondary | ICD-10-CM

## 2021-08-21 DIAGNOSIS — R634 Abnormal weight loss: Secondary | ICD-10-CM

## 2021-08-21 DIAGNOSIS — D135 Benign neoplasm of extrahepatic bile ducts: Secondary | ICD-10-CM

## 2021-08-21 DIAGNOSIS — N182 Chronic kidney disease, stage 2 (mild): Secondary | ICD-10-CM | POA: Diagnosis not present

## 2021-08-21 DIAGNOSIS — R630 Anorexia: Secondary | ICD-10-CM

## 2021-08-21 DIAGNOSIS — K838 Other specified diseases of biliary tract: Secondary | ICD-10-CM | POA: Diagnosis not present

## 2021-08-21 DIAGNOSIS — T85528S Displacement of other gastrointestinal prosthetic devices, implants and grafts, sequela: Secondary | ICD-10-CM

## 2021-08-21 DIAGNOSIS — Z4659 Encounter for fitting and adjustment of other gastrointestinal appliance and device: Secondary | ICD-10-CM | POA: Diagnosis not present

## 2021-08-21 DIAGNOSIS — R140 Abdominal distension (gaseous): Secondary | ICD-10-CM

## 2021-08-21 DIAGNOSIS — Z8619 Personal history of other infectious and parasitic diseases: Secondary | ICD-10-CM | POA: Insufficient documentation

## 2021-08-21 DIAGNOSIS — I509 Heart failure, unspecified: Secondary | ICD-10-CM | POA: Diagnosis not present

## 2021-08-21 DIAGNOSIS — K831 Obstruction of bile duct: Secondary | ICD-10-CM

## 2021-08-21 HISTORY — PX: BILIARY STENT PLACEMENT: SHX5538

## 2021-08-21 HISTORY — PX: REMOVAL OF STONES: SHX5545

## 2021-08-21 HISTORY — PX: HOT HEMOSTASIS: SHX5433

## 2021-08-21 HISTORY — PX: BIOPSY: SHX5522

## 2021-08-21 HISTORY — PX: STENT REMOVAL: SHX6421

## 2021-08-21 HISTORY — PX: ENDOSCOPIC RETROGRADE CHOLANGIOPANCREATOGRAPHY (ERCP) WITH PROPOFOL: SHX5810

## 2021-08-21 HISTORY — PX: PANCREATIC STENT PLACEMENT: SHX5539

## 2021-08-21 LAB — BASIC METABOLIC PANEL
Anion gap: 12 (ref 5–15)
BUN: 22 mg/dL — ABNORMAL HIGH (ref 6–20)
CO2: 26 mmol/L (ref 22–32)
Calcium: 8.8 mg/dL — ABNORMAL LOW (ref 8.9–10.3)
Chloride: 103 mmol/L (ref 98–111)
Creatinine, Ser: 1.21 mg/dL (ref 0.61–1.24)
GFR, Estimated: >60 mL/min (ref >60)
Glucose, Bld: 141 mg/dL — ABNORMAL HIGH (ref 70–99)
Potassium: 3.4 mmol/L — ABNORMAL LOW (ref 3.5–5.1)
Sodium: 141 mmol/L (ref 135–145)

## 2021-08-21 SURGERY — ENDOSCOPIC RETROGRADE CHOLANGIOPANCREATOGRAPHY (ERCP) WITH PROPOFOL
Anesthesia: General

## 2021-08-21 MED ORDER — FENTANYL CITRATE (PF) 100 MCG/2ML IJ SOLN
INTRAMUSCULAR | Status: AC
  Filled 2021-08-21: qty 2

## 2021-08-21 MED ORDER — MIDAZOLAM HCL 5 MG/5ML IJ SOLN
INTRAMUSCULAR | Status: DC | PRN
  Administered 2021-08-21: 1 mg via INTRAVENOUS

## 2021-08-21 MED ORDER — SUGAMMADEX SODIUM 200 MG/2ML IV SOLN
INTRAVENOUS | Status: DC | PRN
  Administered 2021-08-21: 200 mg via INTRAVENOUS

## 2021-08-21 MED ORDER — PROPOFOL 1000 MG/100ML IV EMUL
INTRAVENOUS | Status: AC
  Filled 2021-08-21: qty 100

## 2021-08-21 MED ORDER — CIPROFLOXACIN IN D5W 400 MG/200ML IV SOLN
INTRAVENOUS | Status: DC | PRN
  Administered 2021-08-21: 400 mg via INTRAVENOUS

## 2021-08-21 MED ORDER — SODIUM CHLORIDE 0.9 % IV SOLN
INTRAVENOUS | Status: AC | PRN
  Administered 2021-08-21: 500 mL via INTRAMUSCULAR

## 2021-08-21 MED ORDER — OMEPRAZOLE 40 MG PO CPDR: 40.0000 mg | DELAYED_RELEASE_CAPSULE | Freq: Two times a day (BID) | ORAL | 4 refills | Status: DC

## 2021-08-21 MED ORDER — ONDANSETRON HCL 4 MG/2ML IJ SOLN
INTRAMUSCULAR | Status: DC | PRN
  Administered 2021-08-21: 4 mg via INTRAVENOUS

## 2021-08-21 MED ORDER — INDOMETHACIN 50 MG RE SUPP
RECTAL | Status: DC | PRN
  Administered 2021-08-21: 100 mg via RECTAL

## 2021-08-21 MED ORDER — OXYCODONE HCL 10 MG PO TABS: 10.0000 mg | ORAL_TABLET | Freq: Four times a day (QID) | ORAL | 0 refills | Status: AC | PRN

## 2021-08-21 MED ORDER — SODIUM CHLORIDE 0.9 % IV SOLN: INTRAVENOUS | Status: DC

## 2021-08-21 MED ORDER — SODIUM CHLORIDE 0.9 % IV SOLN
INTRAVENOUS | Status: DC | PRN
  Administered 2021-08-21: 09:00:00 20 mL

## 2021-08-21 MED ORDER — SUCRALFATE 1 GM/10ML PO SUSP: 1.0000 g | Freq: Four times a day (QID) | ORAL | 1 refills | Status: DC

## 2021-08-21 MED ORDER — PROPOFOL 10 MG/ML IV BOLUS
INTRAVENOUS | Status: DC | PRN
  Administered 2021-08-21: 200 mg via INTRAVENOUS

## 2021-08-21 MED ORDER — SODIUM CHLORIDE 0.9 % IV SOLN
1.5000 g | Freq: Once | INTRAVENOUS | Status: DC
  Filled 2021-08-21: qty 4

## 2021-08-21 MED ORDER — FENTANYL CITRATE (PF) 100 MCG/2ML IJ SOLN: 25.0000 ug | INTRAMUSCULAR | Status: DC | PRN

## 2021-08-21 MED ORDER — PROMETHAZINE HCL 25 MG/ML IJ SOLN: 6.2500 mg | INTRAMUSCULAR | Status: DC | PRN

## 2021-08-21 MED ORDER — CIPROFLOXACIN IN D5W 400 MG/200ML IV SOLN
INTRAVENOUS | Status: AC
  Filled 2021-08-21: qty 200

## 2021-08-21 MED ORDER — ROCURONIUM BROMIDE 10 MG/ML (PF) SYRINGE
PREFILLED_SYRINGE | INTRAVENOUS | Status: DC | PRN
  Administered 2021-08-21: 60 mg via INTRAVENOUS

## 2021-08-21 MED ORDER — MIDAZOLAM HCL 2 MG/2ML IJ SOLN
INTRAMUSCULAR | Status: AC
  Filled 2021-08-21: qty 2

## 2021-08-21 MED ORDER — FENTANYL CITRATE (PF) 250 MCG/5ML IJ SOLN
INTRAMUSCULAR | Status: AC
  Filled 2021-08-21: qty 5

## 2021-08-21 MED ORDER — INDOMETHACIN 50 MG RE SUPP
RECTAL | Status: AC
  Filled 2021-08-21: qty 2

## 2021-08-21 MED ORDER — PROPOFOL 10 MG/ML IV BOLUS
INTRAVENOUS | Status: AC
  Filled 2021-08-21: qty 20

## 2021-08-21 MED ORDER — FENTANYL CITRATE (PF) 100 MCG/2ML IJ SOLN
INTRAMUSCULAR | Status: DC | PRN
  Administered 2021-08-21: 100 ug via INTRAVENOUS

## 2021-08-21 MED ORDER — LIDOCAINE 2% (20 MG/ML) 5 ML SYRINGE
INTRAMUSCULAR | Status: DC | PRN
  Administered 2021-08-21: 100 mg via INTRAVENOUS

## 2021-08-21 MED ORDER — DEXAMETHASONE SODIUM PHOSPHATE 10 MG/ML IJ SOLN
INTRAMUSCULAR | Status: DC | PRN
  Administered 2021-08-21: 10 mg via INTRAVENOUS

## 2021-08-21 MED ORDER — GLUCAGON HCL RDNA (DIAGNOSTIC) 1 MG IJ SOLR
INTRAMUSCULAR | Status: DC | PRN
Start: 2021-08-21 — End: 2021-08-21
  Administered 2021-08-21 (×4): .25 mg via INTRAVENOUS

## 2021-08-21 NOTE — Telephone Encounter (Signed)
Pt c/o swelling: STAT is pt has developed SOB within 24 hours  If swelling, where is the swelling located? stmach  How much weight have you gained and in what time span? Not sure  Have you gained 3 pounds in a day or 5 pounds in a week?   Do you have a log of your daily weights (if so, list)?   Are you currently taking a fluid pill? Yes, but it does not seem to be working for the lst 3 days  Are you currently SOB? Not at this time, he can lay down flat  Have you traveled recently? no

## 2021-08-21 NOTE — Discharge Instructions (Signed)
YOU HAD AN ENDOSCOPIC PROCEDURE TODAY: Refer to the procedure report and other information in the discharge instructions given to you for any specific questions about what was found during the examination. If this information does not answer your questions, please call [place] office at [phone] to clarify.  ° °YOU SHOULD EXPECT: Some feelings of bloating in the abdomen. Passage of more gas than usual. Walking can help get rid of the air that was put into your GI tract during the procedure and reduce the bloating. If you had a lower endoscopy (such as a colonoscopy or flexible sigmoidoscopy) you may notice spotting of blood in your stool or on the toilet paper. Some abdominal soreness may be present for a day or two, also. ° °DIET: Your first meal following the procedure should be a light meal and then it is ok to progress to your normal diet. A half-sandwich or bowl of soup is an example of a good first meal. Heavy or fried foods are harder to digest and may make you feel nauseous or bloated. Drink plenty of fluids but you should avoid alcoholic beverages for 24 hours. If you had a esophageal dilation, please see attached instructions for diet.   ° °ACTIVITY: Your care partner should take you home directly after the procedure. You should plan to take it easy, moving slowly for the rest of the day. You can resume normal activity the day after the procedure however YOU SHOULD NOT DRIVE, use power tools, machinery or perform tasks that involve climbing or major physical exertion for 24 hours (because of the sedation medicines used during the test).  ° °SYMPTOMS TO REPORT IMMEDIATELY: °A gastroenterologist can be reached at any hour. Please call [phone]  for any of the following symptoms:  °Following lower endoscopy (colonoscopy, flexible sigmoidoscopy) °Excessive amounts of blood in the stool  °Significant tenderness, worsening of abdominal pains  °Swelling of the abdomen that is new, acute  °Fever of 100° or  higher  °Following upper endoscopy (EGD, EUS, ERCP, esophageal dilation) °Vomiting of blood or coffee ground material  °New, significant abdominal pain  °New, significant chest pain or pain under the shoulder blades  °Painful or persistently difficult swallowing  °New shortness of breath  °Black, tarry-looking or red, bloody stools ° °FOLLOW UP:  °If any biopsies were taken you will be contacted by phone or by letter within the next 1-3 weeks. Call [phone]  if you have not heard about the biopsies in 3 weeks.  °Please also call with any specific questions about appointments or follow up tests. ° °

## 2021-08-21 NOTE — Telephone Encounter (Signed)
-----   Message from Irving Copas., MD sent at 08/21/2021  9:27 AM EST ----- DJ, please see ERCP report when able to review where things stand currently.  I'll update once pathology returns so we decide if he needs another Endoscopic attempt vs surgical management with [facility] as we had discussed years ago.  Timothy Townsel, pancreatic stent KUB protocol.  HFP/Amylase/Lipase in 1-2 weeks in the lab.  ERCP stent exchange in the system with me in 4-6 months.  FYI Dr. Sheryle Hail.

## 2021-08-21 NOTE — Telephone Encounter (Signed)
Returned call to pt he states that he does not think it is working. He is taking torsemide and he states that he is taking BID and states that he has had decreased urination. Has not been weighing himself so he does not know if it has increased. He states that "if he has to go out" he will not take. He did have lab today and his creatinine is 1.21 and is up from last 1.15 2 mo ago. He states that he can see the swelling and SOB. Informed pt to get scale so we can track swelling and SOB. He states that he will track his SOB and swelling and states that if this continues tomorrow he will go to the ER. He stats that when he lays down he is also SOB and when walking. He is taking the torsemide 20mg  BID. And his swelling is getting worse and the decreased urination. He states that he will go to the ER tomorrow. I reviewed his last note and it does say that torsemide is 20mg  daily not the BID. But he states that his pill pack is BID he states that he double checked and it does say twice daily. She will continue the BID as it says on the pill pack. Should we change back to BID on the med list? Please advise.

## 2021-08-21 NOTE — Telephone Encounter (Signed)
Called patient, he states that he wanted to call back to clarify his fluid medication- he is only taking 40 mg daily, but he is taking 2 20 mg tablets. This is what caused him to be confused.   However, he did say that he ate some hot wings and he thinks this was the cause of the swelling, he also had a endoscopy procedure today and since then the abdomen swelling and improved. He does not want to increase the Torsemide just yet, but will wait a few days to see if it continues to improve.   He will get a scale tomorrow and begin to weigh daily. He was advise to notify us of any weight gain of 3 lbs in a day or 5 lbs in a week.

## 2021-08-21 NOTE — Telephone Encounter (Signed)
[place], KUB and labs ordered.  Order for [place] sent to the schedulers to set up.  Recall ERCP entered

## 2021-08-21 NOTE — Transfer of Care (Signed)
Immediate Anesthesia Transfer of Care Note  Patient: Jerome Adams  Procedure(s) Performed: ENDOSCOPIC RETROGRADE CHOLANGIOPANCREATOGRAPHY (ERCP) WITH PROPOFOL REMOVAL OF SLUDGE STENT REMOVAL BILIARY STENT PLACEMENT PANCREATIC STENT PLACEMENT BIOPSY HOT HEMOSTASIS (ARGON PLASMA COAGULATION/BICAP)  Patient Location: PACU and Endo  Anesthesia Type:General  Level of Consciousness: awake, alert  and oriented  Airway & Oxygen Therapy: Patient Spontanous Breathing and Patient connected to face mask oxygen  Post-op Assessment: Stable  Post vital signs: Reviewed and stable  Last Vitals:  Vitals Value Taken Time  BP    Temp    Pulse 62 08/21/21 0909  Resp 15 08/21/21 0909  SpO2 100 % 08/21/21 0909  Vitals shown include unvalidated device data.  Last Pain:  Vitals:   08/21/21 0721  TempSrc: Oral  PainSc: 0-No pain         Complications: No notable events documented.

## 2021-08-21 NOTE — Telephone Encounter (Signed)
Pt c/o medication issue:  1. Name of Medication: torsemide (DEMADEX) 20 MG tablet  2. How are you currently taking this medication (dosage and times per day)? Patient is taking 2 tablet (40 mg total) by mouth daily  3. Are you having a reaction (difficulty breathing--STAT)? No   4. What is your medication issue? Patient wanted to know if Dr. Percival Spanish wants him to increase dosage of medication or what to do. Please call back for direction on medication

## 2021-08-21 NOTE — Anesthesia Procedure Notes (Signed)
Procedure Name: Intubation Date/Time: 08/21/2021 7:56 AM Performed by: Maxwell Caul, CRNA Pre-anesthesia Checklist: Patient identified, Emergency Drugs available, Suction available and Patient being monitored Patient Re-evaluated:Patient Re-evaluated prior to induction Oxygen Delivery Method: Circle system utilized Preoxygenation: Pre-oxygenation with 100% oxygen Induction Type: IV induction Ventilation: Mask ventilation without difficulty Laryngoscope Size: Mac and 4 Grade View: Grade II Tube type: Oral Tube size: 7.5 mm Number of attempts: 1 Airway Equipment and Method: Stylet Placement Confirmation: ETT inserted through vocal cords under direct vision, positive ETCO2 and breath sounds checked- equal and bilateral Secured at: 22 cm Tube secured with: Tape Dental Injury: Teeth and Oropharynx as per pre-operative assessment

## 2021-08-21 NOTE — H&P (Signed)
GASTROENTEROLOGY PROCEDURE H&P NOTE   Primary Care Physician: Antonietta Jewel, MD  HPI: Jerome Adams is a 61 y.o. male with  Past Medical History:  Diagnosis Date   Alcohol abuse    Arthritis    "joints" (03/30/2013)   CHF (congestive heart failure) (HCC)    Chronic lower back pain    Chronic pancreatitis ([place])    CKD (chronic kidney disease), stage II    [facility], stage II   COPD (chronic obstructive pulmonary disease) ([place]) 07/31/2014   Depression    Drug abuse ([place])    a. Remote drug abuse (cocaine). UDS 07/2014 neg for cocaine.   Essential hypertension    GERD (gastroesophageal reflux disease)    takes  otc   Hearing loss    decreased hearing in right ear   Hepatitis C 2010   a. never treated - undectable virus 12/2013.   Hyperlipidemia    Hypertriglyceridemia    NICM (nonischemic cardiomyopathy) ([place])    a. 12/2007 Echo: EF 45-50%;  b. 07/2014 Echo: EF 20-25%, diff HK, mod dil LA, sev dil RA, mild TR, PASP 31 mmHg.   PUD (peptic ulcer disease)    Sleep apnea    was tested here at [facility]....does not use cpap   Spinal cord stimulator status    has had for 1 yr--inserted by [facility]   Tobacco abuse    Transaminitis    Past Surgical History:  Procedure Laterality Date   ANTERIOR CERVICAL DECOMP/DISCECTOMY FUSION  04/05/2012   Procedure: ANTERIOR CERVICAL DECOMPRESSION/DISCECTOMY FUSION 1 LEVEL;  Surgeon: Elaina Hoops, MD;  Location: [place] [facility];  Service: Neurosurgery;  Laterality: N/A;  Cervical five-six anterior cervical decompression and fusion   BILIARY DILATION  04/16/2020   Procedure: BILIARY DILATION;  Surgeon: Rush Landmark Telford Nab., MD;  Location: [place];  Service: Gastroenterology;;   BILIARY STENT PLACEMENT  06/20/2019   Procedure: BILIARY STENT PLACEMENT;  Surgeon: Irving Copas., MD;  Location: [place];  Service: Gastroenterology;;   BILIARY STENT PLACEMENT N/A 09/21/2019   Procedure: BILIARY STENT PLACEMENT;  Surgeon: Irving Copas., MD;  Location: [facility];  Service: Gastroenterology;  Laterality: N/A;   BILIARY STENT PLACEMENT N/A 06/11/2020   Procedure: BILIARY STENT PLACEMENT;  Surgeon: Rush Landmark Telford Nab., MD;  Location: [facility];  Service: Gastroenterology;  Laterality: N/A;   BIOPSY  03/31/2019   Procedure: BIOPSY;  Surgeon: Milus Banister, MD;  [facility];  Service: Endoscopy;;   BIOPSY  09/21/2019   Procedure: BIOPSY;  Surgeon: Irving Copas., MD;  Location: [facility];  Service: Gastroenterology;;   BIOPSY  04/16/2020   Procedure: BIOPSY;  Surgeon: Irving Copas., MD;  Location: [place];  Service: Gastroenterology;;   BIOPSY  06/11/2020   Procedure: BIOPSY;  Surgeon: Irving Copas., MD;  Location: [facility];  Service: Gastroenterology;;   COLONOSCOPY WITH PROPOFOL N/A 08/28/2016   Procedure: COLONOSCOPY WITH PROPOFOL;  Surgeon: Milus Banister, MD;  [facility];  Service: Endoscopy;  Laterality: N/A;   ENDOSCOPIC MUCOSAL RESECTION  06/20/2019   Procedure: ENDOSCOPIC MUCOSAL RESECTION;  Surgeon: Rush Landmark, Telford Nab., MD;  Location: [place] [facility];  Service: Gastroenterology;;   ENDOSCOPIC RETROGRADE CHOLANGIOPANCREATOGRAPHY (ERCP) WITH PROPOFOL N/A 09/21/2019   Procedure: ENDOSCOPIC RETROGRADE CHOLANGIOPANCREATOGRAPHY (ERCP) WITH PROPOFOL;  Surgeon: Irving Copas., MD;  [facility];  Service: Gastroenterology;  Laterality: N/A;   ENDOSCOPIC RETROGRADE CHOLANGIOPANCREATOGRAPHY (ERCP) WITH PROPOFOL N/A 04/16/2020   Procedure: ENDOSCOPIC RETROGRADE CHOLANGIOPANCREATOGRAPHY (ERCP) WITH PROPOFOL;  Surgeon: Irving Copas., MD;  Location: [place];  Service: Gastroenterology;  Laterality: N/A;   ERCP N/A 06/20/2019   Procedure: ENDOSCOPIC RETROGRADE CHOLANGIOPANCREATOGRAPHY (ERCP);  Surgeon: Irving Copas., MD;  Location: [place];  Service: Gastroenterology;  Laterality: N/A;   ERCP N/A 06/11/2020    Procedure: ENDOSCOPIC RETROGRADE CHOLANGIOPANCREATOGRAPHY (ERCP);  Surgeon: Irving Copas., MD;  Location: [facility];  Service: Gastroenterology;  Laterality: N/A;   ESOPHAGOGASTRODUODENOSCOPY (EGD) WITH PROPOFOL N/A 08/28/2016   Procedure: ESOPHAGOGASTRODUODENOSCOPY (EGD) WITH PROPOFOL;  Surgeon: Milus Banister, MD;  [facility];  Service: Endoscopy;  Laterality: N/A;   ESOPHAGOGASTRODUODENOSCOPY (EGD) WITH PROPOFOL N/A 03/31/2019   Procedure: ESOPHAGOGASTRODUODENOSCOPY (EGD) WITH PROPOFOL;  Surgeon: Milus Banister, MD;  [facility];  Service: Endoscopy;  Laterality: N/A;   ESOPHAGOGASTRODUODENOSCOPY (EGD) WITH PROPOFOL N/A 06/20/2019   Procedure: ESOPHAGOGASTRODUODENOSCOPY (EGD) WITH PROPOFOL;  Surgeon: Rush Landmark Telford Nab., MD;  Location: [place];  Service: Gastroenterology;  Laterality: N/A;   ESOPHAGOGASTRODUODENOSCOPY (EGD) WITH PROPOFOL N/A 09/21/2019   Procedure: ESOPHAGOGASTRODUODENOSCOPY (EGD) WITH PROPOFOL;  Surgeon: Rush Landmark Telford Nab., MD;  [facility];  Service: Gastroenterology;  Laterality: N/A;   ESOPHAGOGASTRODUODENOSCOPY (EGD) WITH PROPOFOL N/A 07/30/2020   Procedure: ESOPHAGOGASTRODUODENOSCOPY (EGD) WITH PROPOFOL;  Surgeon: Rush Landmark Telford Nab., MD;  Location: [place];  Service: Gastroenterology;  Laterality: N/A;   EUS N/A 03/31/2019   Procedure: UPPER ENDOSCOPIC ULTRASOUND (EUS) RADIAL;  Surgeon: Milus Banister, MD;  [facility];  Service: Endoscopy;  Laterality: N/A;   FOOT SURGERY Right 2012?   "took piece off that was hurting me" (03/30/2013)   HARDWARE REMOVAL     "took screw out of my back; it had damaged my nerve" (03/30/2013)--lumber   HEMOSTASIS CLIP PLACEMENT  06/20/2019   Procedure: HEMOSTASIS CLIP PLACEMENT;  Surgeon: Irving Copas., MD;  Location: [place];  Service: Gastroenterology;;   INCISION AND DRAINAGE ABSCESS Left    "lower jaw, tooth abscess" (03/30/2013)   LEFT HEART  CATHETERIZATION WITH CORONARY ANGIOGRAM N/A 08/03/2014   Procedure: LEFT HEART CATHETERIZATION WITH CORONARY ANGIOGRAM;  Surgeon: Blane Ohara, MD;  Location: [facility];  Service: Cardiovascular;  Laterality: N/A;   PANCREATIC STENT PLACEMENT  06/20/2019   Procedure: PANCREATIC STENT PLACEMENT;  Surgeon: Rush Landmark Telford Nab., MD;  Location: [place];  Service: Gastroenterology;;   PANCREATIC STENT PLACEMENT  09/21/2019   Procedure: PANCREATIC STENT PLACEMENT;  Surgeon: Irving Copas., MD;  Location: [facility];  Service: Gastroenterology;;   PANCREATIC STENT PLACEMENT  06/11/2020   Procedure: PANCREATIC STENT PLACEMENT;  Surgeon: Irving Copas., MD;  Location: [facility];  Service: Gastroenterology;;   POLYPECTOMY  06/20/2019   Procedure: POLYPECTOMY;  Surgeon: Irving Copas., MD;  Location: [place];  Service: Gastroenterology;;   POSTERIOR LUMBAR FUSION     "L4-5" (03/31/2103)   REMOVAL OF STONES  09/21/2019   Procedure: REMOVAL OF STONES;  Surgeon: Irving Copas., MD;  Location: [facility];  Service: Gastroenterology;;   REMOVAL OF STONES  04/16/2020   Procedure: REMOVAL OF STONES;  Surgeon: Irving Copas., MD;  Location: [place];  Service: Gastroenterology;;   Clide Deutscher  06/11/2020   Procedure: Clide Deutscher;  Surgeon: Irving Copas., MD;  Location: [facility];  Service: Gastroenterology;;   Joan Mayans  06/20/2019   Procedure: Joan Mayans;  Surgeon: Irving Copas., MD;  Location: [place];  Service: Gastroenterology;;   SPINAL CORD STIMULATOR IMPLANT  11/14/2012   STENT REMOVAL  09/21/2019   Procedure: STENT REMOVAL;  Surgeon: Justice Britain  Brooke Bonito., MD;  Location: [facility];  Service: Gastroenterology;;   Lavell Islam REMOVAL  04/16/2020   Procedure: STENT REMOVAL;  Surgeon: Irving Copas., MD;  Location: [place];  Service: Gastroenterology;;   Lavell Islam REMOVAL  07/30/2020    Procedure: STENT REMOVAL;  Surgeon: Irving Copas., MD;  Location: [place];  Service: Gastroenterology;;  pancreatic   SUBMUCOSAL LIFTING INJECTION  06/20/2019   Procedure: SUBMUCOSAL LIFTING INJECTION;  Surgeon: Irving Copas., MD;  Location: [place];  Service: Gastroenterology;;   UPPER ESOPHAGEAL ENDOSCOPIC ULTRASOUND (EUS) N/A 06/20/2019   Procedure: UPPER ESOPHAGEAL ENDOSCOPIC ULTRASOUND (EUS);  Surgeon: Irving Copas., MD;  Location: [place];  Service: Gastroenterology;  Laterality: N/A;   WRIST SURGERY Right 1980's   repair of tendons and nerves   Current Facility-Administered Medications  Medication Dose Route Frequency Provider Last Rate Last Admin   0.9 %  sodium chloride infusion   Intravenous Continuous Mansouraty, Telford Nab., MD       ampicillin-sulbactam (UNASYN) 1.5 g in sodium chloride 0.9 % 100 mL IVPB  1.5 g Intravenous Once Mansouraty, Telford Nab., MD       fentaNYL (SUBLIMAZE) injection 25-50 mcg  25-50 mcg Intravenous Q5 min PRN Brennan Bailey, MD       promethazine (PHENERGAN) injection 6.25 mg  6.25 mg Intravenous Q15 min PRN Brennan Bailey, MD        Current Facility-Administered Medications:    0.9 %  sodium chloride infusion, , Intravenous, Continuous, Mansouraty, Telford Nab., MD   ampicillin-sulbactam (UNASYN) 1.5 g in sodium chloride 0.9 % 100 mL IVPB, 1.5 g, Intravenous, Once, Mansouraty, Telford Nab., MD   fentaNYL (SUBLIMAZE) injection 25-50 mcg, 25-50 mcg, Intravenous, Q5 min PRN, Brennan Bailey, MD   promethazine (PHENERGAN) injection 6.25 mg, 6.25 mg, Intravenous, Q15 min PRN, Brennan Bailey, MD Allergies  Allergen Reactions   Hydrocodone-Acetaminophen Itching   Family History  Problem Relation Age of Onset   Heart disease Father        H/o MI followed by SCD @ 29.   Heart disease Sister        Died suddenly @ 77 - h/o CHF s/p AICD.   Breast cancer Sister    Bone cancer Mother    Esophageal cancer Neg  Hx    Stomach cancer Neg Hx    Colon cancer Neg Hx    Inflammatory bowel disease Neg Hx    Liver disease Neg Hx    Pancreatic cancer Neg Hx    Rectal cancer Neg Hx    Social History   Socioeconomic History   Marital status: Divorced    Spouse name: Not on file   Number of children: 1   Years of education: Not on file   Highest education level: Not on file  Occupational History   Occupation: UNEMPLOYEED    Employer: UNEMPLOYED    Comment: on disability.   Tobacco Use   Smoking status: Some Days    Packs/day: 1.00    Years: 40.00    Pack years: 40.00    Types: Cigarettes   Smokeless tobacco: Never  Vaping Use   Vaping Use: Never used  Substance and Sexual Activity   Alcohol use: Not Currently    Comment: stopped 2019   Drug use: No    Types: Cocaine    Comment: 03/30/2013 "[place]"   Sexual activity: Not on file  Other Topics Concern   Not on file  Social History Narrative  Lives in [place] by himself.  Does not routinely exercise.   Social Determinants of Health   Financial Resource Strain: Not on file  Food Insecurity: Not on file  Transportation Needs: Not on file  Physical Activity: Not on file  Stress: Not on file  Social Connections: Not on file  Intimate Partner Violence: Not on file    Physical Exam: Today's Vitals   08/21/21 0721  BP: (!) 141/89  Pulse: (!) 56  Resp: 14  Temp: 97.9 F (36.6 C)  TempSrc: Oral  SpO2: 100%  Weight: 95.3 kg  Height: 5\' 10"  (1.778 m)  PainSc: 0-No pain   Body mass index is 30.13 kg/m. GEN: NAD EYE: Sclerae anicteric ENT: MMM CV: Non-tachycardic GI: Soft, NT/ND NEURO:  Alert & Oriented x 3  Lab Results: No results for input(s): WBC, HGB, HCT, PLT in the last 72 hours. BMET No results for input(s): NA, K, CL, CO2, GLUCOSE, BUN, CREATININE, CALCIUM in the last 72 hours. LFT No results for input(s): PROT, ALBUMIN, AST, ALT, ALKPHOS, BILITOT, BILIDIR, IBILI in the last 72 hours. PT/INR No results for  input(s): LABPROT, INR in the last 72 hours.   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  Last ERCP was over 1 year ago but he did not get rescheduled until now.  Most recent imaging shows no other significant issues.  Patient also describing issues of abdominal bloating.  The risks of an ERCP were discussed at length, including but not limited to the risk of perforation, bleeding, abdominal pain, post-ERCP pancreatitis (while usually mild can be severe and even life threatening).   The risks and benefits of endoscopic evaluation/treatment were discussed with the patient and/or family; these include but are not limited to the risk of perforation, infection, bleeding, missed lesions, lack of diagnosis, severe illness requiring hospitalization, as well as anesthesia and sedation related illnesses.  The patient's history has been reviewed, patient examined, no change in status, and deemed stable for procedure.  The patient and/or family is agreeable to proceed.    Justice Britain, MD [facility] Office # [phone]

## 2021-08-21 NOTE — Telephone Encounter (Signed)
Returned call to pt he states that he is currently not at home and states that he is not unsure of how much lasix that he is taking. He will chen when he gets home and call back with the current dose. Earlier we went over the last fills/dose and he stated that he was taking 40mg  BID but states that now he is unsure. I was under the impression that he was looking at the pill pack list when we were talking earlier but now he states that he wasn't and that he will check and let us know what his current dose is and message or call back. Will await call back.

## 2021-08-21 NOTE — Op Note (Signed)
[facility] Patient Name: Jerome Adams Procedure Date: 08/21/2021 MRN: [id number] Attending MD: Justice Britain , MD Date of Birth: [birth year] CSN: [id number] Age: 61 Admit Type: Outpatient Procedure:                ERCP Indications:              Generalized abdominal pain, Biliary stent removal,                            Prior ampullary/ papillary polypectomy, Prior                            Endoscopic Retrograde Cholangiopancreatography,                            Abdominal bloating Providers:                Justice Britain, MD, Burtis Junes, RN, Luan Moore, Technician, Virgia Land, CRNA Referring MD:             Milus Banister, MD, Antonietta Jewel Medicines:                General Anesthesia, Cipro 400 mg IV, Indomethacin                            100 mg PR (given after patient was intubated),                            Glucagon 1 mg IV Complications:            No immediate complications. Estimated blood loss:                            Minimal. Estimated Blood Loss:     Estimated blood loss was minimal. Procedure:                Pre-Anesthesia Assessment:                           - Prior to the procedure, a History and Physical                            was performed, and patient medications and                            allergies were reviewed. The patient's tolerance of                            previous anesthesia was also reviewed. The risks                            and benefits of the procedure and the sedation                            options and risks were  discussed with the patient.                            All questions were answered, and informed consent                            was obtained. Prior Anticoagulants: The patient has                            taken no previous anticoagulant or antiplatelet                            agents except for NSAID medication. ASA Grade                             Assessment: III - A patient with severe systemic                            disease. After reviewing the risks and benefits,                            the patient was deemed in satisfactory condition to                            undergo the procedure.                           After obtaining informed consent, the scope was                            passed under direct vision. Throughout the                            procedure, the patient's blood pressure, pulse, and                            oxygen saturations were monitored continuously. The                            TJF-Q190V ([id number]) Olympus duodenoscope was                            introduced through the mouth, and used to inject                            contrast into and used to inject contrast into the                            bile duct and ventral pancreatic duct. The ERCP was                            accomplished without difficulty. The patient  tolerated the procedure. Scope In: Scope Out: Findings:      A biliary stent was visible on the scout film.      The esophagus was successfully intubated under direct vision without       detailed examination of the pharynx, larynx, and associated structures,       and upper GI tract. The upper GI tract was grossly normal. Inspection of       the major papilla revealed that biliary and pancreatic sphincterotomies       had been performed previously. The biliary sphincterotomy appeared open.       The pancreatic sphincterotomy appeared open. The ampulla however, had       evidence of what appeared to be recurrent adenomatous change. Biopsies       were performed of this area through the ERCP scope with a cold forceps       for histology. One stent was removed from the biliary tree using a snare       and sent for cytology.      A short 0.035 inch Soft Jagwire was passed into the ventral pancreatic       duct on first pass, it did not go deeply  however into the body/tail       region and left in place.      A short 0.035 inch Soft Jagwire was passed into the biliary tree. The       Hydratome sphincterotome was passed over the guidewire and the bile duct       was then deeply cannulated. Contrast was injected. I personally       interpreted the bile duct images. Ductal flow of contrast was adequate.       Image quality was adequate. Contrast extended to the hepatic ducts.       Opacification of the entire biliary tree except for the gallbladder was       successful. The main bile duct was moderately dilated. The largest       diameter was 14 mm. The lower third of the main bile duct contained       filling defects thought to be sludge. To discover objects, the biliary       tree was swept with a retrieval balloon. Significant amounts of sludge       were swept from the duct. Eventually the 12-15 mm balloon was able to be       passed with ease with further sludge being found. An occlusion       cholangiogram was performed that showed no further significant biliary       pathology and no overt stricturing downstream. The biliary wire was then       removed.      Decision made, as he has had snare papillectomy and 2 Avulsion       techniques to try a different technique for likely recurrent adenomatous       tissue. I proceeded with coagulation for tissue destruction in the area       of the papilla using argon plasma through the ERCP scope which was       successful (I used right colon settings in a forced coagulation). This       was felt to be complete throughout the area of recurrent adenomatous       tissue.      To decrease chance of pancreatitis from the area being worked on today,  one 4 Fr by 7 cm temporary plastic pancreatic stent with a single       external pigtail was placed into the ventral pancreatic duct. The stent       was in good position.      A short 0.035 inch Soft Jagwire was repassed into the biliary  tree. One       10 Fr by 5 cm plastic biliary stent with a single external flap and a       single internal flap was placed into the common bile duct. Bile flowed       through the stent. The stent was in good position in an effort of       decreasing cholangitis concerns in near future.      A pancreatogram was not performed.      The duodenoscope was withdrawn from the patient. Impression:               - Prior biliary sphincterotomy appeared open. Prior                            pancreatic sphincterotomy appeared open.                           - Unfortunately, it looks like there has been                            recurrence in the ampullary region once again. I                            biopsied this area of the papilla to rule out                            recurrent adenomatous change.                           - One previously placed biliary stent was removed                            from the biliary tree and sent for cytology.                           - The entire main bile duct was moderately dilated.                           - The fluoroscopic examination was suspicious for                            sludge.                           - The biliary tree was swept and significant                            amounts of sludge were found.                           - I then proceeded  with tissue destruction of all                            the ampullary tissue with argon plasma coagulation                            (APC) to try and salvage this endoscopic                            ampullectomy.                           - One temporary plastic pancreatic stent was placed                            into the ventral pancreatic duct to decrease PEP.                           - One plastic biliary stent was placed into the                            common bile duct to decrease risk of cholangitis. Moderate Sedation:      Not Applicable - Patient had care per  Anesthesia. Recommendation:           - The patient will be observed post-procedure,                            until all discharge criteria are met.                           - Discharge patient to home.                           - Patient has a contact number available for                            emergencies. The signs and symptoms of potential                            delayed complications were discussed with the                            patient. Return to normal activities tomorrow.                            Written discharge instructions were provided to the                            patient.                           - Watch for pancreatitis, bleeding, perforation,                            and cholangitis.                           -  Low fat diet.                           - I am giving the patient a short course of                            Oxycodone 10 mg Q6H PRN (total 12 tablets) to try                            and help get him through the next few days, as he                            has had issues with pancreatitis in the past. He                            understands that this is not a long-term medication                            that I or [facility] will be represcribing in                            the future as we have discussed with him in the                            past.                           - Observe patient's clinical course.                           - HFP and Amylase and Lipase within 1-week at the                            office (Dr. Ardis Hughs or my name).                           - Await cytology results and await path results.                           - If there is evidence of recurrent adenomatous                            change in biopsies, then I recommend a referral to                            [facility] to see if any other salvage techniques                            can be considered for this ampullectomy or patient                             will need to undergo a surgical ampullectomy  attempt (which he had wanted to minimize                            performing).                           - If there is no evidence of adenomatous change on                            the biopsies, then I will repeat ERCP in 4-6 months                            for surveillance and remove the biliary stent at                            that time.                           - Patient will need a KUB 2-view in 10-14 days to                            ensure pancreatic stent has migrated successfully.                            If still present at that time will need to be                            scheduled for EGD with stent pull.                           - Patient needs an abdominal U/S to evaluate if he                            has developed ascites that may be a result of his                            underlying heart failure, he is concerned that his                            abdomen is becoming more distended (we will                            arrange).                           The findings and recommendations were discussed                            with the patient.                           - The findings and recommendations were discussed  with the designated responsible adult. Procedure Code(s):        --- Professional ---                           [phone], Endoscopic retrograde                            cholangiopancreatography (ERCP); with removal and                            exchange of stent(s), biliary or pancreatic duct,                            including pre- and post-dilation and guide wire                            passage, when performed, including sphincterotomy,                            when performed, each stent exchanged                           43278, Endoscopic retrograde                            cholangiopancreatography  (ERCP); with ablation of                            tumor(s), polyp(s), or other lesion(s), including                            pre- and post-dilation and guide wire passage, when                            performed                           43264, Endoscopic retrograde                            cholangiopancreatography (ERCP); with removal of                            calculi/debris from biliary/pancreatic duct(s) Diagnosis Code(s):        --- Professional ---                           W96.75, Encounter for fitting and adjustment of                            other gastrointestinal appliance and device                           R10.84, Generalized abdominal pain                           Z98.890, Other specified postprocedural states  R14.0, Abdominal distension (gaseous)                           K83.8, Other specified diseases of biliary tract CPT copyright 2019 [employer]. All rights reserved. The codes documented in this report are preliminary and upon coder review may  be revised to meet current compliance requirements. Justice Britain, MD 08/21/2021 9:23:10 AM Number of Addenda: 0

## 2021-08-21 NOTE — Anesthesia Postprocedure Evaluation (Signed)
Anesthesia Post Note  Patient: Jerome Adams  Procedure(s) Performed: ENDOSCOPIC RETROGRADE CHOLANGIOPANCREATOGRAPHY (ERCP) WITH PROPOFOL REMOVAL OF SLUDGE STENT REMOVAL BILIARY STENT PLACEMENT PANCREATIC STENT PLACEMENT BIOPSY HOT HEMOSTASIS (ARGON PLASMA COAGULATION/BICAP)     Patient location during evaluation: PACU Anesthesia Type: General Level of consciousness: awake and alert and oriented Pain management: pain level controlled Vital Signs Assessment: post-procedure vital signs reviewed and stable Respiratory status: spontaneous breathing, nonlabored ventilation and respiratory function stable Cardiovascular status: blood pressure returned to baseline Postop Assessment: no apparent nausea or vomiting Anesthetic complications: no   No notable events documented.  Last Vitals:  Vitals:   08/21/21 0930 08/21/21 0943  BP: 120/60 129/71  Pulse: (!) 56 (!) 58  Resp: 18 11  Temp:    SpO2: 95% 93%    Last Pain:  Vitals:   08/21/21 0943  TempSrc:   PainSc: 0-No pain                 Marthenia Rolling

## 2021-08-21 NOTE — Telephone Encounter (Signed)
Mansouraty, Telford Nab., MD  Timothy Lasso, RN Rainelle Sulewski,  Please set this patient up for an abdominal ultrasound next available so we can evaluate whether he has developed ascites.  He is with me for procedure this morning but will be able to be scheduled later this week.  Thanks.  GM

## 2021-08-22 ENCOUNTER — Encounter (HOSPITAL_COMMUNITY): Payer: Self-pay | Admitting: Gastroenterology

## 2021-08-22 ENCOUNTER — Encounter: Payer: Self-pay | Admitting: Gastroenterology

## 2021-08-22 LAB — CYTOLOGY - NON PAP

## 2021-08-22 LAB — SURGICAL PATHOLOGY

## 2021-08-23 ENCOUNTER — Encounter: Payer: Self-pay | Admitting: *Deleted

## 2021-08-29 ENCOUNTER — Ambulatory Visit (HOSPITAL_COMMUNITY): Admission: RE | Admit: 2021-08-29 | Payer: Medicare Other | Source: Ambulatory Visit

## 2021-08-29 ENCOUNTER — Other Ambulatory Visit: Payer: Self-pay | Admitting: Cardiology

## 2021-09-05 ENCOUNTER — Other Ambulatory Visit: Payer: Self-pay | Admitting: Gastroenterology

## 2021-09-11 ENCOUNTER — Other Ambulatory Visit: Payer: Self-pay

## 2021-09-11 NOTE — Progress Notes (Signed)
The proposed treatment discussed in conference is for discussion purpose only and is not a binding recommendation.  The patients have not been physically examined, or presented with their treatment options.  Therefore, final treatment plans cannot be decided.  

## 2021-09-28 ENCOUNTER — Other Ambulatory Visit: Payer: Self-pay | Admitting: Cardiology

## 2021-10-04 ENCOUNTER — Ambulatory Visit: Payer: Medicare Other | Admitting: Gastroenterology

## 2021-11-01 ENCOUNTER — Telehealth: Payer: Self-pay | Admitting: Cardiology

## 2021-11-01 ENCOUNTER — Other Ambulatory Visit: Payer: Self-pay | Admitting: Cardiology

## 2021-11-01 NOTE — Telephone Encounter (Signed)
LMTCB

## 2021-11-01 NOTE — Telephone Encounter (Signed)
I spoke to the patient and he informed me that he's taking Torsemide (Demadex) 20 MG 3 tablets once a day. And stated 1 tablet once a day is not enough and he can not breath when he only takes 1 tablet once a day. Patient asked if his prescription can be changed. ?

## 2021-11-01 NOTE — Telephone Encounter (Signed)
?*  STAT* If patient is at the pharmacy, call can be transferred to refill team. ? ? ?1. Which medications need to be refilled? (please list name of each medication and dose if known)  ?torsemide (DEMADEX) 20 MG tablet ? ?2. Which pharmacy/location (including street and city if local pharmacy) is medication to be sent to?[place], [place] ? ?3. Do they need a 30 day or 90 day supply? 90 ds ? ?

## 2021-11-05 ENCOUNTER — Other Ambulatory Visit: Payer: Self-pay

## 2021-11-05 DIAGNOSIS — I5022 Chronic systolic (congestive) heart failure: Secondary | ICD-10-CM

## 2021-11-05 DIAGNOSIS — Z91148 Patient's other noncompliance with medication regimen for other reason: Secondary | ICD-10-CM

## 2021-11-05 NOTE — Telephone Encounter (Signed)
Patient reports no swelling or sob. He stated the 60 mg torsemide is working. Informed patient to come for blood work today so we can have results before any refills can be sent to his pharmacy. He stated hw iwll come this afternoon to have blood work. BMET ordered. ?

## 2021-11-06 LAB — BASIC METABOLIC PANEL
BUN/Creatinine Ratio: 16 (ref 10–24)
BUN: 16 mg/dL (ref 8–27)
CO2: 31 mmol/L — ABNORMAL HIGH (ref 20–29)
Calcium: 9.6 mg/dL (ref 8.6–10.2)
Chloride: 101 mmol/L (ref 96–106)
Creatinine, Ser: 1.03 mg/dL (ref 0.76–1.27)
Glucose: 130 mg/dL — ABNORMAL HIGH (ref 70–99)
Potassium: 4.2 mmol/L (ref 3.5–5.2)
Sodium: 148 mmol/L — ABNORMAL HIGH (ref 134–144)
eGFR: 83 mL/min/{1.73_m2} (ref >59)

## 2021-11-11 ENCOUNTER — Other Ambulatory Visit: Payer: Self-pay | Admitting: Gastroenterology

## 2021-11-11 MED ORDER — TORSEMIDE 20 MG PO TABS: 60.0000 mg | ORAL_TABLET | Freq: Every day | ORAL | 3 refills | Status: AC

## 2021-11-11 NOTE — Telephone Encounter (Signed)
Labs complete and Refill sent to the pharmacy electronically.  ?

## 2021-11-11 NOTE — Addendum Note (Signed)
Addended by: Cristopher Estimable on: 11/11/2021 04:04 PM ? ? Modules accepted: Orders ? ?

## 2021-11-12 ENCOUNTER — Ambulatory Visit (INDEPENDENT_AMBULATORY_CARE_PROVIDER_SITE_OTHER): Payer: Medicare Other | Admitting: Gastroenterology

## 2021-11-12 ENCOUNTER — Ambulatory Visit (INDEPENDENT_AMBULATORY_CARE_PROVIDER_SITE_OTHER)
Admission: RE | Admit: 2021-11-12 | Discharge: 2021-11-12 | Disposition: A | Discharge status: Home / Self Care | Payer: Medicare Other | Source: Ambulatory Visit | Attending: Gastroenterology | Admitting: Gastroenterology

## 2021-11-12 ENCOUNTER — Encounter: Payer: Self-pay | Admitting: Gastroenterology

## 2021-11-12 VITALS — BP 100/62 | HR 51 | Ht 70.0 in | Wt 213.0 lb

## 2021-11-12 DIAGNOSIS — D135 Benign neoplasm of extrahepatic bile ducts: Secondary | ICD-10-CM | POA: Diagnosis not present

## 2021-11-12 DIAGNOSIS — T85528S Displacement of other gastrointestinal prosthetic devices, implants and grafts, sequela: Secondary | ICD-10-CM | POA: Diagnosis not present

## 2021-11-12 DIAGNOSIS — R140 Abdominal distension (gaseous): Secondary | ICD-10-CM | POA: Diagnosis not present

## 2021-11-12 DIAGNOSIS — Z9889 Other specified postprocedural states: Secondary | ICD-10-CM

## 2021-11-12 NOTE — Patient Instructions (Addendum)
Your provider has requested that you have an abdominal x ray before leaving today. Please go to the basement floor to our Radiology department for the test. ? ? ?You have been scheduled for an abdominal ultrasound at [facility] (1st floor of hospital) on 11/22/21 at 8:00am. Please arrive 30 minutes prior to your appointment for registration. Make certain not to have anything to eat or drink 6 hours prior to your appointment. Should you need to reschedule your appointment, please contact radiology at [phone]. This test typically takes about 30 minutes to perform. ? ?You have been scheduled for an endoscopy. Please follow written instructions given to you at your visit today. ?If you use inhalers (even only as needed), please bring them with you on the day of your procedure. ? ?Thank you for choosing me and [facility]. ? ?Dr. Rush Landmark ? ?

## 2021-11-12 NOTE — Progress Notes (Signed)
? ?GASTROENTEROLOGY OUTPATIENT CLINIC VISIT  ? ?Primary Care Provider ?Antonietta Jewel, MD ?[address] ?[place] [address] ?[phone] ? ?Primary GI provider ?Dr. Ardis Hughs ? ?Patient Profile: ?Jerome Adams is a 61 y.o. 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  The patient presents to the [facility] for an evaluation and management of problem(s) noted below: ? ?Problem List ?1. Ampullary adenoma   ?2. Distended abdomen   ?3. History of biliary duct stent placement   ?4. History of ERCP   ? ? ? ?History of Present Illness ?Please see prior notes for full details of HPI. ? ?Interval History ?Today, the patient returns for follow-up.  He has not undergone his KUB or his abdominal ultrasound that we ordered after his last ERCP.  Interestingly, his last ERCP still showed evidence of what appeared to be ampullary adenoma but biopsies were performed before APC ablation.  There was no evidence of dysplasia.  His case was discussed at [facility] and it was felt that follow-up ERCP should lead to biopsies and subsequent stent removal and evaluation.  If patient still had dysplasia then consideration of surgical intervention versus another attempt by another advanced endoscopist may be reasonable.  Since the patient's last procedure, he has had titration of his diuretics.  A lot of his abdominal bloating has improved.  He still has abdominal gas and eructation but overall is doing better with higher diuretic therapy.  He denies any jaundice or scleral icterus or darkened urine or pruritus. ? ?GI Review of Systems ?Positive as above ?Negative for dysphagia, odynophagia, change in bowel habits, melena,  hematochezia ? ?Review of Systems ?General: Denies fevers/chills ?Cardiovascular: Denies chest pain ?Pulmonary: Shortness of breath at baseline ?Gastroenterological: See HPI ?Genitourinary: Denies darkened urine ?Hematological: Denies easy bruising/bleeding ?Dermatological: Denies jaundice ?Psychological: Mood is stable ? ? ?Medications ?Current Outpatient Medications  ?Medication Sig Dispense Refill  ? acetaminophen (TYLENOL) 500 MG tablet Take 1,000 mg by mouth every 6 (six) hours as needed (pain).    ? Cholecalciferol (VITAMIN D3) 50 MCG (2000 UT) TABS Take 6,000 Units by mouth daily.    ? gabapentin (NEURONTIN) 300 MG capsule Take 300 mg by mouth 2 (two) times daily.    ? meloxicam (MOBIC) 15 MG tablet Take 15 mg by mouth daily.    ? metoprolol succinate (TOPROL-XL) 50 MG 24 hr tablet Take 1 tablet by mouth daily at 12 noon. Take with or immediately following a meal.  **Dose change, discontinue spironolactone.** 90 tablet 0  ? Multiple Vitamin (MULTIVITAMIN WITH MINERALS) TABS tablet Take 1 tablet by mouth daily.    ? omega-3 acid ethyl esters (LOVAZA) 1 G capsule Take 1 capsule (1 g total) by mouth 2 (two) times daily. 60 capsule 5  ? oxycodone (ROXICODONE) 30 MG immediate release tablet Take 30 mg by mouth 4 (four) times daily as needed for pain.   0  ? rosuvastatin (CRESTOR) 40 MG tablet Take 40 mg by mouth daily.    ? sacubitril-valsartan (ENTRESTO) 97-103 MG Take 1 tablet by mouth 2 (two) times daily. 90 tablet 1  ? sertraline (ZOLOFT) 100 MG tablet Take 100 mg by mouth at bedtime.     ? torsemide (DEMADEX) 20 MG tablet Take 3 tablets (60 mg total) by mouth daily. 270 tablet 3  ?  omeprazole (PRILOSEC) 40 MG capsule TAKE 1 CAPSULE (40 MG TOTAL) BY MOUTH 2 (TWO) TIMES DAILY BEFORE A MEAL. 180 capsule 1  ? sucralfate (CARAFATE) 1 GM/10ML suspension TAKE 10 MLS (1 G TOTAL) BY MOUTH 4 (FOUR) TIMES DAILY. 420 mL 0  ? ?No current facility-administered medications for this visit.  ? ? ?Allergies ?Allergies   ?Allergen Reactions  ? Hydrocodone-Acetaminophen Itching  ? ? ?Histories ?Past Medical History:  ?Diagnosis Date  ? Alcohol abuse   ? Arthritis   ? "joints" (03/30/2013)  ? CHF (congestive heart failure) ([place])   ? Chronic lower back pain   ? Chronic pancreatitis ([place])   ? CKD (chronic kidney disease), stage II   ? [facility], stage II  ? COPD (chronic obstructive pulmonary disease) ([place]) 07/31/2014  ? Depression   ? Drug abuse ([place])   ? a. Remote drug abuse (cocaine). UDS 07/2014 neg for cocaine.  ? Essential hypertension   ? GERD (gastroesophageal reflux disease)   ? takes  otc  ? Hearing loss   ? decreased hearing in right ear  ? Hepatitis C 2010  ? a. never treated - undectable virus 12/2013.  ? Hyperlipidemia   ? Hypertriglyceridemia   ? NICM (nonischemic cardiomyopathy) ([place])   ? a. 12/2007 Echo: EF 45-50%;  b. 07/2014 Echo: EF 20-25%, diff HK, mod dil LA, sev dil RA, mild TR, PASP 31 mmHg.  ? PUD (peptic ulcer disease)   ? Sleep apnea   ? was tested here at [facility]....does not use cpap  ? Spinal cord stimulator status   ? has had for 1 yr--inserted by [facility]  ? Tobacco abuse   ? Transaminitis   ? ?Past Surgical History:  ?Procedure Laterality Date  ? ANTERIOR CERVICAL DECOMP/DISCECTOMY FUSION  04/05/2012  ? Procedure: ANTERIOR CERVICAL DECOMPRESSION/DISCECTOMY FUSION 1 LEVEL;  Surgeon: Elaina Hoops, MD;  Location: [place] [facility];  Service: Neurosurgery;  Laterality: N/A;  Cervical five-six anterior cervical decompression and fusion  ? BILIARY DILATION  04/16/2020  ? Procedure: BILIARY DILATION;  Surgeon: Rush Landmark Telford Nab., MD;  Location: [place];  Service: Gastroenterology;;  ? BILIARY STENT PLACEMENT  06/20/2019  ? Procedure: BILIARY STENT PLACEMENT;  Surgeon: Rush Landmark Telford Nab., MD;  Location: [place];  Service: Gastroenterology;;  ? BILIARY STENT PLACEMENT N/A 09/21/2019  ? Procedure: BILIARY STENT PLACEMENT;  Surgeon: Rush Landmark Telford Nab., MD;  Location: [facility];  Service:  Gastroenterology;  Laterality: N/A;  ? BILIARY STENT PLACEMENT N/A 06/11/2020  ? Procedure: BILIARY STENT PLACEMENT;  Surgeon: Rush Landmark Telford Nab., MD;  Location: [facility];  Service: Gastroenterology;  Laterality: N/A;  ? BILIARY STENT PLACEMENT N/A 08/21/2021  ? Procedure: BILIARY STENT PLACEMENT;  Surgeon: Rush Landmark Telford Nab., MD;  Location: [facility];  Service: Gastroenterology;  Laterality: N/A;  ? BIOPSY  03/31/2019  ? Procedure: BIOPSY;  Surgeon: Milus Banister, MD;  Location: [facility];  Service: Endoscopy;;  ? BIOPSY  09/21/2019  ? Procedure: BIOPSY;  Surgeon: Irving Copas., MD;  Location: [facility];  Service: Gastroenterology;;  ? BIOPSY  04/16/2020  ? Procedure: BIOPSY;  Surgeon: Irving Copas., MD;  Location: [place];  Service: Gastroenterology;;  ? BIOPSY  06/11/2020  ? Procedure: BIOPSY;  Surgeon: Irving Copas., MD;  Location: [facility];  Service: Gastroenterology;;  ? BIOPSY  08/21/2021  ? Procedure: BIOPSY;  Surgeon: Irving Copas., MD;  Location: [facility];  Service: Gastroenterology;;  ? COLONOSCOPY WITH PROPOFOL N/A 08/28/2016  ? Procedure: COLONOSCOPY WITH  PROPOFOL;  Surgeon: Milus Banister, MD;  Location: [facility];  Service: Endoscopy;  Laterality: N/A;  ? ENDOSCOPIC MUCOSAL RESECTION  06/20/2019  ? Procedure: ENDOSCOPIC MUCOSAL RESECTION;  Surgeon: Rush Landmark Telford Nab., MD;  Location: [place];  Service: Gastroenterology;;  ? ENDOSCOPIC RETROGRADE CHOLANGIOPANCREATOGRAPHY (ERCP) WITH PROPOFOL N/A 09/21/2019  ? Procedure: ENDOSCOPIC RETROGRADE CHOLANGIOPANCREATOGRAPHY (ERCP) WITH PROPOFOL;  Surgeon: Rush Landmark Telford Nab., MD;  [facility];  Service: Gastroenterology;  Laterality: N/A;  ? ENDOSCOPIC RETROGRADE CHOLANGIOPANCREATOGRAPHY (ERCP) WITH PROPOFOL N/A 04/16/2020  ? Procedure: ENDOSCOPIC RETROGRADE CHOLANGIOPANCREATOGRAPHY (ERCP) WITH PROPOFOL;  Surgeon: Rush Landmark Telford Nab., MD;  Location: [place];  Service: Gastroenterology;  Laterality: N/A;  ? ENDOSCOPIC RETROGRADE CHOLANGIOPANCREATOGRAPHY (ERCP) WITH PROPOFOL N/A 08/21/2021  ? Procedure: ENDOSCOPIC RETROGRADE CHOLANGIOPANCREATOGRAPHY (ERCP) WITH PROPOFOL;  Surgeon: Rush Landmark

## 2021-11-15 DIAGNOSIS — Z9889 Other specified postprocedural states: Secondary | ICD-10-CM | POA: Insufficient documentation

## 2021-11-22 ENCOUNTER — Ambulatory Visit (HOSPITAL_COMMUNITY): Admission: RE | Admit: 2021-11-22 | Payer: Medicare Other | Source: Ambulatory Visit

## 2021-11-29 ENCOUNTER — Ambulatory Visit (HOSPITAL_COMMUNITY)
Admission: RE | Admit: 2021-11-29 | Discharge: 2021-11-29 | Disposition: A | Discharge status: Home / Self Care | Payer: Medicare Other | Source: Ambulatory Visit | Attending: Gastroenterology | Admitting: Gastroenterology

## 2021-11-29 DIAGNOSIS — R1084 Generalized abdominal pain: Secondary | ICD-10-CM | POA: Insufficient documentation

## 2021-11-29 DIAGNOSIS — R140 Abdominal distension (gaseous): Secondary | ICD-10-CM | POA: Insufficient documentation

## 2021-11-29 DIAGNOSIS — K861 Other chronic pancreatitis: Secondary | ICD-10-CM | POA: Insufficient documentation

## 2021-12-01 ENCOUNTER — Other Ambulatory Visit: Payer: Self-pay | Admitting: Cardiology

## 2021-12-02 ENCOUNTER — Other Ambulatory Visit: Payer: Self-pay

## 2021-12-02 DIAGNOSIS — R932 Abnormal findings on diagnostic imaging of liver and biliary tract: Secondary | ICD-10-CM

## 2021-12-02 MED ORDER — METOPROLOL SUCCINATE ER 50 MG PO TB24: ORAL_TABLET | ORAL | 2 refills | Status: AC

## 2021-12-06 ENCOUNTER — Telehealth: Payer: Self-pay | Admitting: Cardiology

## 2021-12-06 NOTE — Telephone Encounter (Signed)
Pt c/o of Chest Pain: STAT if CP now or developed within 24 hours ? ?1. Are you having CP right now?  ?Yes  ? ?2. Are you experiencing any other symptoms (ex. SOB, nausea, vomiting, sweating)?  ?Left forearm   ? ?3. How long have you been experiencing CP?  ?1-2 weeks ? ?4. Is your CP continuous or coming and going? Coming and going,  ? ?5. Have you taken Nitroglycerin?  ? ?No, patient states he does not have any nitro ? ?

## 2021-12-06 NOTE — Telephone Encounter (Signed)
Received a call from patient he stated he has been having chest pain off and on since last night.No chest pain at present.Appointment scheduled with Fabian Sharp PA 5/15 at 10:30 am.Advised to go to ED if he has any more chest pain. ?

## 2021-12-08 NOTE — Progress Notes (Signed)
?Cardiology Office Note:   ? ?Date:  12/09/2021  ? ?ID:  Jerome Adams, DOB 1961/01/29, MRN [id number] ? ?PCP:  Antonietta Jewel, MD ?  ?[facility] Providers ?Cardiologist:  Minus Breeding, MD ?Cardiology APP:  Ledora Bottcher, [place] { ?Referring MD: Antonietta Jewel, MD  ? ?Chief Complaint  ?Patient presents with  ? Chest Pain  ? ? ?History of Present Illness:   ? ?Jerome Adams is a 61 y.o. male with a hx of alcohol abuse, CKD stage II, COPD, remote cocaine use, tobacco abuse, hypertension, GERD, hepatitis C, hyperlipidemia, and NICM thought to be related to alcohol abuse with an EF of 20 to 25% in 2016 with improvement to 35 to 45% in 2018 and again in 2020.  He has a history of missing appointments.  He was last seen in clinic on 08/07/2021 with Dr. Percival Spanish and reported dyspnea on exertion when walking a flight of stairs.  He was raising a 31 and 43-year-old children.  He reportedly stopped drinking alcohol and has had problems with pancreatic obstruction and stenting by GI.  He has previously not tolerated medication titration for his heart failure due to lightheadedness.  Echocardiogram was repeated for dyspnea on exertion revealed an LVEF 40 to 45%, aortic root 40 mm.  ? ?He has been taking 40 mg of torsemide.  ? ?He recently saw GI and is scheduled for CT abdomen for echogenicity seen on his liver on [place]. ? ?He called our office last week with chest pain and was added to my schedule. He is here alone. He reports little aches in his chest that are intermittent on the left side of his chest. Last Thursday evening/early Friday morning chest pain woke him from sleep. CP located on the right side of his chest, described as a pressure and stabbing pain, lasted 12-15 hours. He was diaphoretic, but no radiation or N/V.  He did not go to the ER. He called our office and was advised to go to the ER if CP returned. He has not had  recurrence of chest pain, chest pain free in the office today. He is taking 60 mg  torsemide and feels his fluid balance is better. He does report orthopnea with 2 pillows and HOB is raised ? ?His brother died last year of heart disease. He recounts that 5 siblings and his father have all died of heart disease. He continues to smoke.  ? ? ?Past Medical History:  ?Diagnosis Date  ? Alcohol abuse   ? Arthritis   ? "joints" (03/30/2013)  ? CHF (congestive heart failure) ([employer])   ? Chronic lower back pain   ? Chronic pancreatitis ([place])   ? CKD (chronic kidney disease), stage II   ? [facility], stage II  ? COPD (chronic obstructive pulmonary disease) ([place]) 07/31/2014  ? Depression   ? Drug abuse ([place])   ? a. Remote drug abuse (cocaine). UDS 07/2014 neg for cocaine.  ? Essential hypertension   ? GERD (gastroesophageal reflux disease)   ? takes  otc  ? Hearing loss   ? decreased hearing in right ear  ? Hepatitis C 2010  ? a. never treated - undectable virus 12/2013.  ? Hyperlipidemia   ? Hypertriglyceridemia   ? NICM (nonischemic cardiomyopathy) ([place])   ? a. 12/2007 Echo: EF 45-50%;  b. 07/2014 Echo: EF 20-25%, diff HK, mod dil LA, sev dil RA, mild TR, PASP 31 mmHg.  ? PUD (peptic ulcer disease)   ? Sleep apnea   ?  was tested here at [facility]....does not use cpap  ? Spinal cord stimulator status   ? has had for 1 yr--inserted by [facility]  ? Tobacco abuse   ? Transaminitis   ? ? ?Past Surgical History:  ?Procedure Laterality Date  ? ANTERIOR CERVICAL DECOMP/DISCECTOMY FUSION  04/05/2012  ? Procedure: ANTERIOR CERVICAL DECOMPRESSION/DISCECTOMY FUSION 1 LEVEL;  Surgeon: Elaina Hoops, MD;  Location: [place] [facility];  Service: Neurosurgery;  Laterality: N/A;  Cervical five-six anterior cervical decompression and fusion  ? BILIARY DILATION  04/16/2020  ? Procedure: BILIARY DILATION;  Surgeon: Rush Landmark Telford Nab., MD;  Location: [place];  Service: Gastroenterology;;  ? BILIARY STENT PLACEMENT  06/20/2019  ? Procedure: BILIARY STENT PLACEMENT;  Surgeon: Rush Landmark Telford Nab., MD;  Location: [place];   Service: Gastroenterology;;  ? BILIARY STENT PLACEMENT N/A 09/21/2019  ? Procedure: BILIARY STENT PLACEMENT;  Surgeon: Rush Landmark Telford Nab., MD;  Location: [facility];  Service: Gastroenterology;  Laterality: N/A;  ? BILIARY STENT PLACEMENT N/A 06/11/2020  ? Procedure: BILIARY STENT PLACEMENT;  Surgeon: Rush Landmark Telford Nab., MD;  Location: [facility];  Service: Gastroenterology;  Laterality: N/A;  ? BILIARY STENT PLACEMENT N/A 08/21/2021  ? Procedure: BILIARY STENT PLACEMENT;  Surgeon: Rush Landmark Telford Nab., MD;  Location: [facility];  Service: Gastroenterology;  Laterality: N/A;  ? BIOPSY  03/31/2019  ? Procedure: BIOPSY;  Surgeon: Milus Banister, MD;  [facility];  Service: Endoscopy;;  ? BIOPSY  09/21/2019  ? Procedure: BIOPSY;  Surgeon: Irving Copas., MD;  Location: [facility];  Service: Gastroenterology;;  ? BIOPSY  04/16/2020  ? Procedure: BIOPSY;  Surgeon: Irving Copas., MD;  Location: [place];  Service: Gastroenterology;;  ? BIOPSY  06/11/2020  ? Procedure: BIOPSY;  Surgeon: Irving Copas., MD;  Location: [facility];  Service: Gastroenterology;;  ? BIOPSY  08/21/2021  ? Procedure: BIOPSY;  Surgeon: Irving Copas., MD;  Location: [facility];  Service: Gastroenterology;;  ? COLONOSCOPY WITH PROPOFOL N/A 08/28/2016  ? Procedure: COLONOSCOPY WITH PROPOFOL;  Surgeon: Milus Banister, MD;  [facility];  Service: Endoscopy;  Laterality: N/A;  ? ENDOSCOPIC MUCOSAL RESECTION  06/20/2019  ? Procedure: ENDOSCOPIC MUCOSAL RESECTION;  Surgeon: Rush Landmark Telford Nab., MD;  Location: [place];  Service: Gastroenterology;;  ? ENDOSCOPIC RETROGRADE CHOLANGIOPANCREATOGRAPHY (ERCP) WITH PROPOFOL N/A 09/21/2019  ? Procedure: ENDOSCOPIC RETROGRADE CHOLANGIOPANCREATOGRAPHY (ERCP) WITH PROPOFOL;  Surgeon: Rush Landmark Telford Nab., MD;  [facility];  Service: Gastroenterology;  Laterality: N/A;  ? ENDOSCOPIC RETROGRADE  CHOLANGIOPANCREATOGRAPHY (ERCP) WITH PROPOFOL N/A 04/16/2020  ? Procedure: ENDOSCOPIC RETROGRADE CHOLANGIOPANCREATOGRAPHY (ERCP) WITH PROPOFOL;  Surgeon: Rush Landmark Telford Nab., MD;  Location: [place];  Service: Gastroenterology;  Laterality: N/A;  ? ENDOSCOPIC RETROGRADE CHOLANGIOPANCREATOGRAPHY (ERCP) WITH PROPOFOL N/A 08/21/2021  ? Procedure: ENDOSCOPIC RETROGRADE CHOLANGIOPANCREATOGRAPHY (ERCP) WITH PROPOFOL;  Surgeon: Rush Landmark Telford Nab., MD;  [facility];  Service: Gastroenterology;  Laterality: N/A;  ? ERCP N/A 06/20/2019  ? Procedure: ENDOSCOPIC RETROGRADE CHOLANGIOPANCREATOGRAPHY (ERCP);  Surgeon: Irving Copas., MD;  Location: [place];  Service: Gastroenterology;  Laterality: N/A;  ? ERCP N/A 06/11/2020  ? Procedure: ENDOSCOPIC RETROGRADE CHOLANGIOPANCREATOGRAPHY (ERCP);  Surgeon: Irving Copas., MD;  Location[facility];  Service: Gastroenterology;  Laterality: N/A;  ? ESOPHAGOGASTRODUODENOSCOPY (EGD) WITH PROPOFOL N/A 08/28/2016  ? Procedure: ESOPHAGOGASTRODUODENOSCOPY (EGD) WITH PROPOFOL;  Surgeon: Milus Banister, MD;  [facility];  Service: Endoscopy;  Laterality: N/A;  ? ESOPHAGOGASTRODUODENOSCOPY (EGD) WITH PROPOFOL N/A 03/31/2019  ? Procedure: ESOPHAGOGASTRODUODENOSCOPY (EGD) WITH PROPOFOL;  Surgeon: Ardis Hughs,  Melene Plan, MD;  Location: [facility];  Service: Endoscopy;  Laterality: N/A;  ? ESOPHAGOGASTRODUODENOSCOPY (EGD) WITH PROPOFOL N/A 06/20/2019  ? Procedure: ESOPHAGOGASTRODUODENOSCOPY (EGD) WITH PROPOFOL;  Surgeon: Rush Landmark Telford Nab., MD;  Location: [place];  Service: Gastroenterology;  Laterality: N/A;  ? ESOPHAGOGASTRODUODENOSCOPY (EGD) WITH PROPOFOL N/A 09/21/2019  ? Procedure: ESOPHAGOGASTRODUODENOSCOPY (EGD) WITH PROPOFOL;  Surgeon: Rush Landmark Telford Nab., MD;  Location: [facility];  Service: Gastroenterology;  Laterality: N/A;  ? ESOPHAGOGASTRODUODENOSCOPY (EGD) WITH PROPOFOL N/A 07/30/2020  ? Procedure:  ESOPHAGOGASTRODUODENOSCOPY (EGD) WITH PROPOFOL;  Surgeon: Rush Landmark Telford Nab., MD;  Location: [place];  Service: Gastroenterology;  Laterality: N/A;  ? EUS N/A 03/31/2019  ? Procedure: UPPER ENDOSCOPIC ULTRASOUND (EUS) RADIAL;  Surgeon: Milus Banister

## 2021-12-09 ENCOUNTER — Ambulatory Visit (INDEPENDENT_AMBULATORY_CARE_PROVIDER_SITE_OTHER): Payer: Medicare Other | Admitting: Physician Assistant

## 2021-12-09 ENCOUNTER — Encounter: Payer: Self-pay | Admitting: Physician Assistant

## 2021-12-09 VITALS — BP 100/70 | HR 55 | Ht 70.0 in | Wt 214.4 lb

## 2021-12-09 DIAGNOSIS — R0609 Other forms of dyspnea: Secondary | ICD-10-CM

## 2021-12-09 DIAGNOSIS — I440 Atrioventricular block, first degree: Secondary | ICD-10-CM

## 2021-12-09 DIAGNOSIS — I428 Other cardiomyopathies: Secondary | ICD-10-CM

## 2021-12-09 DIAGNOSIS — R072 Precordial pain: Secondary | ICD-10-CM

## 2021-12-09 DIAGNOSIS — E785 Hyperlipidemia, unspecified: Secondary | ICD-10-CM

## 2021-12-09 DIAGNOSIS — R079 Chest pain, unspecified: Secondary | ICD-10-CM | POA: Diagnosis not present

## 2021-12-09 DIAGNOSIS — R001 Bradycardia, unspecified: Secondary | ICD-10-CM

## 2021-12-09 DIAGNOSIS — I10 Essential (primary) hypertension: Secondary | ICD-10-CM

## 2021-12-09 MED ORDER — NITROGLYCERIN 0.4 MG SL SUBL: 0.4000 mg | SUBLINGUAL_TABLET | SUBLINGUAL | 12 refills | Status: AC | PRN

## 2021-12-09 NOTE — Patient Instructions (Addendum)
Medication Instructions:  ?Your physician recommends that you continue on your current medications as directed. Please refer to the Current Medication list given to you today.  ? ?*If you need a refill on your cardiac medications before your next appointment, please call your pharmacy* ? ? ?Lab Work: ?NONE ordered at this time of appointment  ? ?If you have labs (blood work) drawn today and your tests are completely normal, you will receive your results only by: ?MyChart Message (if you have MyChart) OR ?A paper copy in the mail ?If you have any lab test that is abnormal or we need to change your treatment, we will call you to review the results. ? ? ?Testing/Procedures: ?Your physician has recommended that you have a cardiopulmonary stress test (CPX). CPX testing is a non-invasive measurement of heart and lung function. It replaces a traditional treadmill stress test. This type of test provides a tremendous amount of information that relates not only to your present condition but also for future outcomes. This test combines measurements of you ventilation, respiratory gas exchange in the lungs, electrocardiogram (EKG), blood pressure and physical response before, during, and following an exercise protocol.  ? ? ?Follow-Up: ?At [facility], you and your health needs are our priority.  As part of our continuing mission to provide you with exceptional heart care, we have created designated Provider Care Teams.  These Care Teams include your primary Cardiologist (physician) and Advanced Practice Providers (APPs -  Physician Assistants and Nurse Practitioners) who all work together to provide you with the care you need, when you need it. ? ?We recommend signing up for the patient portal called "MyChart".  Sign up information is provided on this After Visit Summary.  MyChart is used to connect with patients for Virtual Visits (Telemedicine).  Patients are able to view lab/test results, encounter notes, upcoming  appointments, etc.  Non-urgent messages can be sent to your provider as well.   ?To learn more about what you can do with MyChart, go to [email or web address].   ? ?Your next appointment:   ?1 month(s) ? ?The format for your next appointment:   ?In Person ? ?Provider:   ?Fabian Sharp, PA-C      ? ? ?Other Instructions ? ? ?Important Information About Sugar ? ? ? ? ? ? ?

## 2021-12-11 ENCOUNTER — Telehealth: Payer: Self-pay

## 2021-12-11 ENCOUNTER — Ambulatory Visit (HOSPITAL_COMMUNITY)
Admission: RE | Admit: 2021-12-11 | Discharge: 2021-12-11 | Disposition: A | Discharge status: Home / Self Care | Payer: Medicare Other | Source: Ambulatory Visit | Attending: Gastroenterology | Admitting: Gastroenterology

## 2021-12-11 ENCOUNTER — Encounter (HOSPITAL_COMMUNITY): Payer: Self-pay

## 2021-12-11 ENCOUNTER — Other Ambulatory Visit: Payer: Self-pay | Admitting: Gastroenterology

## 2021-12-11 DIAGNOSIS — R932 Abnormal findings on diagnostic imaging of liver and biliary tract: Secondary | ICD-10-CM

## 2021-12-11 MED ORDER — IOHEXOL 300 MG/ML  SOLN
100.0000 mL | Freq: Once | INTRAMUSCULAR | Status: AC | PRN
Start: 2021-12-11 — End: 2021-12-11
  Administered 2021-12-11: 100 mL via INTRAVENOUS

## 2021-12-11 MED ORDER — SODIUM CHLORIDE (PF) 0.9 % IJ SOLN
INTRAMUSCULAR | Status: AC
  Filled 2021-12-11: qty 50

## 2021-12-11 NOTE — Telephone Encounter (Signed)
Received call from hospital radiology regarding the CT orders. Per radiology they do not perform IMG909 as ordered. They will do the CT abd/pelvis liver protocol but not the one that was ordered. Explained we had a specific list from Dr. Jannifer Rodney oncology specific scans but per radiology they do not perform these. Order was changed to CT A/P with liver protocol.  ?

## 2021-12-17 ENCOUNTER — Encounter: Payer: Self-pay | Admitting: Gastroenterology

## 2021-12-17 ENCOUNTER — Encounter (HOSPITAL_COMMUNITY): Payer: Medicare Other

## 2021-12-20 ENCOUNTER — Telehealth (HOSPITAL_COMMUNITY): Payer: Self-pay | Admitting: *Deleted

## 2021-12-20 NOTE — Telephone Encounter (Signed)
Close encounter 

## 2021-12-24 ENCOUNTER — Ambulatory Visit (HOSPITAL_COMMUNITY)
Admission: RE | Admit: 2021-12-24 | Discharge: 2021-12-24 | Disposition: A | Discharge status: Home / Self Care | Payer: Medicare Other | Source: Ambulatory Visit | Attending: Physician Assistant | Admitting: Physician Assistant

## 2021-12-24 DIAGNOSIS — R072 Precordial pain: Secondary | ICD-10-CM | POA: Diagnosis present

## 2021-12-24 LAB — MYOCARDIAL PERFUSION IMAGING
LV dias vol: 263 mL (ref 62–150)
LV sys vol: 173 mL
Nuc Stress EF: 34 %
Peak HR: 76 {beats}/min
Rest HR: 51 {beats}/min
Rest Nuclear Isotope Dose: 10.1 mCi
SDS: 0
SRS: 13
SSS: 13
ST Depression (mm): 0 mm
Stress Nuclear Isotope Dose: 31.4 mCi
TID: 1

## 2021-12-24 MED ORDER — TECHNETIUM TC 99M TETROFOSMIN IV KIT
31.4000 | PACK | Freq: Once | INTRAVENOUS | Status: AC | PRN
  Administered 2021-12-24: 31.4 via INTRAVENOUS

## 2021-12-24 MED ORDER — REGADENOSON 0.4 MG/5ML IV SOLN
0.4000 mg | Freq: Once | INTRAVENOUS | Status: AC
  Administered 2021-12-24: 0.4 mg via INTRAVENOUS

## 2021-12-24 MED ORDER — TECHNETIUM TC 99M TETROFOSMIN IV KIT
10.1000 | PACK | Freq: Once | INTRAVENOUS | Status: AC | PRN
  Administered 2021-12-24: 10.1 via INTRAVENOUS

## 2021-12-25 ENCOUNTER — Telehealth: Payer: Self-pay | Admitting: *Deleted

## 2021-12-25 DIAGNOSIS — R072 Precordial pain: Secondary | ICD-10-CM | POA: Insufficient documentation

## 2021-12-25 NOTE — Progress Notes (Unsigned)
Cardiology Office Note   Date:  12/26/2021   ID:  Jerome Adams, DOB [birth year], MRN [id number]  PCP:  Antonietta Jewel, MD  Cardiologist:   Minus Breeding, MD   Chief Complaint  Patient presents with   Cardiomyopathy   Chest Pain     History of Present Illness: Jerome Adams is a 61 y.o. 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  At the last visit he did have palpitations and had a monitor that demonstrated NSVT with the longest run being 14 beats.    He presents for follow up.  He was seen last month as an add on for chest pain.  He had a perfusion study.  There was a large perfusion defect with severe reduction in uptake present in the apical to basal inferior and apex location(s) that is fixed. There is abnormal wall motion in the defect area consistent with infarction.  EF was 34%.  Of note he was in first-degree AV block with bradycardia and his beta-blocker was held the other day.  He states not restarted this per our direction.  He does not feel presyncope or syncope.  He gets some intermittent chest discomfort.  He describes some occasional soreness which was why he had the perfusion study.  I did go look back at a previous perfusion study which suggested possible diaphragmatic attenuation with a fixed inferior defect.  His ejection fraction has been about 35 to 45% most recently.  He is not describing any new PND or orthopnea.  He is not describing any new palpitations.     Past Medical History:  Diagnosis Date   Alcohol abuse    Arthritis    "joints" (03/30/2013)   CHF (congestive heart failure) (HCC)    Chronic lower back pain    Chronic pancreatitis ([place])    CKD (chronic kidney disease), stage II    [facility], stage II   COPD (chronic obstructive pulmonary disease) ([place]) 07/31/2014   Depression    Drug  abuse ([place])    a. Remote drug abuse (cocaine). UDS 07/2014 neg for cocaine.   Essential hypertension    GERD (gastroesophageal reflux disease)    takes  otc   Hearing loss    decreased hearing in right ear   Hepatitis C 2010   a. never treated - undectable virus 12/2013.   Hyperlipidemia    Hypertriglyceridemia    NICM (nonischemic cardiomyopathy) ([place])    a. 12/2007 Echo: EF 45-50%;  b. 07/2014 Echo: EF 20-25%, diff HK, mod dil LA, sev dil RA, mild TR, PASP 31 mmHg.   PUD (peptic ulcer disease)    Sleep apnea    was tested here at [facility]....does not use cpap   Spinal cord stimulator status    has had for 1 yr--inserted by [facility]   Tobacco abuse    Transaminitis     Past Surgical History:  Procedure Laterality Date   ANTERIOR CERVICAL DECOMP/DISCECTOMY FUSION  04/05/2012   Procedure: ANTERIOR CERVICAL DECOMPRESSION/DISCECTOMY FUSION 1 LEVEL;  Surgeon: Elaina Hoops, MD;  Location: [place] [facility];  Service: Neurosurgery;  Laterality: N/A;  Cervical five-six anterior cervical decompression and fusion   BILIARY DILATION  04/16/2020   Procedure: BILIARY DILATION;  Surgeon: Rush Landmark Telford Nab., MD;  Location: [place];  Service: Gastroenterology;;  BILIARY STENT PLACEMENT  06/20/2019   Procedure: BILIARY STENT PLACEMENT;  Surgeon: Rush Landmark Telford Nab., MD;  Location: [place];  Service: Gastroenterology;;   BILIARY STENT PLACEMENT N/A 09/21/2019   Procedure: BILIARY STENT PLACEMENT;  Surgeon: Irving Copas., MD;  [facility];  Service: Gastroenterology;  Laterality: N/A;   BILIARY STENT PLACEMENT N/A 06/11/2020   Procedure: BILIARY STENT PLACEMENT;  Surgeon: Rush Landmark Telford Nab., MD;  [facility];  Service: Gastroenterology;  Laterality: N/A;   BILIARY STENT PLACEMENT N/A 08/21/2021   Procedure: BILIARY STENT PLACEMENT;  Surgeon: Rush Landmark Telford Nab., MD;  [facility];  Service: Gastroenterology;  Laterality: N/A;   BIOPSY  03/31/2019    Procedure: BIOPSY;  Surgeon: Milus Banister, MD;  [facility];  Service: Endoscopy;;   BIOPSY  09/21/2019   Procedure: BIOPSY;  Surgeon: Irving Copas., MD;  Location: [facility];  Service: Gastroenterology;;   BIOPSY  04/16/2020   Procedure: BIOPSY;  Surgeon: Irving Copas., MD;  Location: [place];  Service: Gastroenterology;;   BIOPSY  06/11/2020   Procedure: BIOPSY;  Surgeon: Irving Copas., MD;  Location: [facility];  Service: Gastroenterology;;   BIOPSY  08/21/2021   Procedure: BIOPSY;  Surgeon: Irving Copas., MD;  Location: [facility];  Service: Gastroenterology;;   COLONOSCOPY WITH PROPOFOL N/A 08/28/2016   Procedure: COLONOSCOPY WITH PROPOFOL;  Surgeon: Milus Banister, MD;  [facility];  Service: Endoscopy;  Laterality: N/A;   ENDOSCOPIC MUCOSAL RESECTION  06/20/2019   Procedure: ENDOSCOPIC MUCOSAL RESECTION;  Surgeon: Rush Landmark, Telford Nab., MD;  Location: [facility];  Service: Gastroenterology;;   ENDOSCOPIC RETROGRADE CHOLANGIOPANCREATOGRAPHY (ERCP) WITH PROPOFOL N/A 09/21/2019   Procedure: ENDOSCOPIC RETROGRADE CHOLANGIOPANCREATOGRAPHY (ERCP) WITH PROPOFOL;  Surgeon: Irving Copas., MD;  [facility];  Service: Gastroenterology;  Laterality: N/A;   ENDOSCOPIC RETROGRADE CHOLANGIOPANCREATOGRAPHY (ERCP) WITH PROPOFOL N/A 04/16/2020   Procedure: ENDOSCOPIC RETROGRADE CHOLANGIOPANCREATOGRAPHY (ERCP) WITH PROPOFOL;  Surgeon: Rush Landmark Telford Nab., MD;  Location: [place];  Service: Gastroenterology;  Laterality: N/A;   ENDOSCOPIC RETROGRADE CHOLANGIOPANCREATOGRAPHY (ERCP) WITH PROPOFOL N/A 08/21/2021   Procedure: ENDOSCOPIC RETROGRADE CHOLANGIOPANCREATOGRAPHY (ERCP) WITH PROPOFOL;  Surgeon: Rush Landmark Telford Nab., MD;  [facility];  Service: Gastroenterology;  Laterality: N/A;   ERCP N/A 06/20/2019   Procedure: ENDOSCOPIC RETROGRADE CHOLANGIOPANCREATOGRAPHY (ERCP);  Surgeon:  Irving Copas., MD;  Location: [place];  Service: Gastroenterology;  Laterality: N/A;   ERCP N/A 06/11/2020   Procedure: ENDOSCOPIC RETROGRADE CHOLANGIOPANCREATOGRAPHY (ERCP);  Surgeon: Irving Copas., MD;  Location[facility];  Service: Gastroenterology;  Laterality: N/A;   ESOPHAGOGASTRODUODENOSCOPY (EGD) WITH PROPOFOL N/A 08/28/2016   Procedure: ESOPHAGOGASTRODUODENOSCOPY (EGD) WITH PROPOFOL;  Surgeon: Milus Banister, MD;  [facility];  Service: Endoscopy;  Laterality: N/A;   ESOPHAGOGASTRODUODENOSCOPY (EGD) WITH PROPOFOL N/A 03/31/2019   Procedure: ESOPHAGOGASTRODUODENOSCOPY (EGD) WITH PROPOFOL;  Surgeon: Milus Banister, MD;  [facility];  Service: Endoscopy;  Laterality: N/A;   ESOPHAGOGASTRODUODENOSCOPY (EGD) WITH PROPOFOL N/A 06/20/2019   Procedure: ESOPHAGOGASTRODUODENOSCOPY (EGD) WITH PROPOFOL;  Surgeon: Rush Landmark Telford Nab., MD;  Location: [place];  Service: Gastroenterology;  Laterality: N/A;   ESOPHAGOGASTRODUODENOSCOPY (EGD) WITH PROPOFOL N/A 09/21/2019   Procedure: ESOPHAGOGASTRODUODENOSCOPY (EGD) WITH PROPOFOL;  Surgeon: Rush Landmark Telford Nab., MD;  [facility];  Service: Gastroenterology;  Laterality: N/A;   ESOPHAGOGASTRODUODENOSCOPY (EGD) WITH PROPOFOL N/A 07/30/2020   Procedure: ESOPHAGOGASTRODUODENOSCOPY (EGD) WITH PROPOFOL;  Surgeon: Rush Landmark Telford Nab., MD;  Location: [place];  Service: Gastroenterology;  Laterality: N/A;   EUS N/A 03/31/2019  Procedure: UPPER ENDOSCOPIC ULTRASOUND (EUS) RADIAL;  Surgeon: Milus Banister, MD;  [facility];  Service: Endoscopy;  Laterality: N/A;   FOOT SURGERY Right 2012?   "took piece off that was hurting me" (03/30/2013)   HARDWARE REMOVAL     "took screw out of my back; it had damaged my nerve" (03/30/2013)--lumber   HEMOSTASIS CLIP PLACEMENT  06/20/2019   Procedure: HEMOSTASIS CLIP PLACEMENT;  Surgeon: Irving Copas., MD;  Location: [place];   Service: Gastroenterology;;   HOT HEMOSTASIS N/A 08/21/2021   Procedure: HOT HEMOSTASIS (ARGON PLASMA COAGULATION/BICAP);  Surgeon: Irving Copas., MD;  Location[facility];  Service: Gastroenterology;  Laterality: N/A;   INCISION AND DRAINAGE ABSCESS Left    "lower jaw, tooth abscess" (03/30/2013)   LEFT HEART CATHETERIZATION WITH CORONARY ANGIOGRAM N/A 08/03/2014   Procedure: LEFT HEART CATHETERIZATION WITH CORONARY ANGIOGRAM;  Surgeon: Blane Ohara, MD;  [facility];  Service: Cardiovascular;  Laterality: N/A;   PANCREATIC STENT PLACEMENT  06/20/2019   Procedure: PANCREATIC STENT PLACEMENT;  Surgeon: Rush Landmark Telford Nab., MD;  Location: [place];  Service: Gastroenterology;;   PANCREATIC STENT PLACEMENT  09/21/2019   Procedure: PANCREATIC STENT PLACEMENT;  Surgeon: Irving Copas., MD;  Location[facility];  Service: Gastroenterology;;   PANCREATIC STENT PLACEMENT  06/11/2020   Procedure: PANCREATIC STENT PLACEMENT;  Surgeon: Irving Copas., MD;  Location: [facility];  Service: Gastroenterology;;   PANCREATIC STENT PLACEMENT  08/21/2021   Procedure: PANCREATIC STENT PLACEMENT;  Surgeon: Irving Copas., MD;  Location: [facility];  Service: Gastroenterology;;   POLYPECTOMY  06/20/2019   Procedure: POLYPECTOMY;  Surgeon: Irving Copas., MD;  Location: [place];  Service: Gastroenterology;;   POSTERIOR LUMBAR FUSION     "L4-5" (03/31/2103)   REMOVAL OF STONES  09/21/2019   Procedure: REMOVAL OF STONES;  Surgeon: Irving Copas., MD;  Location: [facility];  Service: Gastroenterology;;   REMOVAL OF STONES  04/16/2020   Procedure: REMOVAL OF STONES;  Surgeon: Irving Copas., MD;  Location: [place];  Service: Gastroenterology;;   REMOVAL OF STONES  08/21/2021   Procedure: REMOVAL OF SLUDGE;  Surgeon: Irving Copas., MD;  Location[facility];  Service: Gastroenterology;;   Clide Deutscher   06/11/2020   Procedure: Clide Deutscher;  Surgeon: Irving Copas., MD;  Location[facility];  Service: Gastroenterology;;   Joan Mayans  06/20/2019   Procedure: Joan Mayans;  Surgeon: Irving Copas., MD;  Location: [place];  Service: Gastroenterology;;   SPINAL CORD STIMULATOR IMPLANT  11/14/2012   STENT REMOVAL  09/21/2019   Procedure: STENT REMOVAL;  Surgeon: Irving Copas., MD;  Location: [facility];  Service: Gastroenterology;;   Lavell Islam REMOVAL  04/16/2020   Procedure: STENT REMOVAL;  Surgeon: Irving Copas., MD;  Location: [place];  Service: Gastroenterology;;   Lavell Islam REMOVAL  07/30/2020   Procedure: STENT REMOVAL;  Surgeon: Irving Copas., MD;  Location: [place];  Service: Gastroenterology;;  pancreatic   STENT REMOVAL  08/21/2021   Procedure: STENT REMOVAL;  Surgeon: Irving Copas., MD;  Location[facility];  Service: Gastroenterology;;   SUBMUCOSAL LIFTING INJECTION  06/20/2019   Procedure: SUBMUCOSAL LIFTING INJECTION;  Surgeon: Irving Copas., MD;  Location: [place];  Service: Gastroenterology;;   UPPER ESOPHAGEAL ENDOSCOPIC ULTRASOUND (EUS) N/A 06/20/2019   Procedure: UPPER ESOPHAGEAL ENDOSCOPIC ULTRASOUND (EUS);  Surgeon: Irving Copas., MD;  Location: [place];  Service: Gastroenterology;  Laterality: N/A;   WRIST SURGERY Right 1980's   repair  of tendons and nerves     Current Outpatient Medications  Medication Sig Dispense Refill   acetaminophen (TYLENOL) 500 MG tablet Take 1,000 mg by mouth every 6 (six) hours as needed (pain).     Cholecalciferol (VITAMIN D3) 50 MCG (2000 UT) TABS Take 6,000 Units by mouth daily.     gabapentin (NEURONTIN) 300 MG capsule Take 300 mg by mouth 2 (two) times daily.     meloxicam (MOBIC) 15 MG tablet Take 15 mg by mouth as needed.     Multiple Vitamin (MULTIVITAMIN WITH MINERALS) TABS tablet Take 1 tablet by mouth daily.     nitroGLYCERIN  (NITROSTAT) 0.4 MG SL tablet Place 1 tablet (0.4 mg total) under the tongue every 5 (five) minutes as needed for chest pain. 30 tablet 12   omega-3 acid ethyl esters (LOVAZA) 1 G capsule Take 1 capsule (1 g total) by mouth 2 (two) times daily. 60 capsule 5   omeprazole (PRILOSEC) 40 MG capsule TAKE 1 CAPSULE (40 MG TOTAL) BY MOUTH 2 (TWO) TIMES DAILY BEFORE A MEAL. 180 capsule 1   oxycodone (ROXICODONE) 30 MG immediate release tablet Take 30 mg by mouth 4 (four) times daily as needed for pain.   0   rosuvastatin (CRESTOR) 40 MG tablet Take 40 mg by mouth daily.     sacubitril-valsartan (ENTRESTO) 97-103 MG Take 1 tablet by mouth 2 (two) times daily. 90 tablet 1   sertraline (ZOLOFT) 100 MG tablet Take 100 mg by mouth at bedtime.      sucralfate (CARAFATE) 1 GM/10ML suspension TAKE 10 MLS (1 G TOTAL) BY MOUTH 4 (FOUR) TIMES DAILY. 420 mL 0   torsemide (DEMADEX) 20 MG tablet Take 3 tablets (60 mg total) by mouth daily. 270 tablet 3   metoprolol succinate (TOPROL-XL) 50 MG 24 hr tablet Take 1 tablet by mouth daily at 12 noon. Take with or immediately following a meal.  **Dose change, discontinue spironolactone.** (Patient not taking: Reported on 12/26/2021) 90 tablet 2   No current facility-administered medications for this visit.    Allergies:   Hydrocodone-acetaminophen    ROS:  Please see the history of present illness.   Otherwise, review of systems are positive for none.   All other systems are reviewed and negative.    PHYSICAL EXAM: VS:  BP 101/65   Pulse (!) 54   Ht '5\' 10"'$  (1.778 m)   Wt 205 lb 6.4 oz (93.2 kg)   SpO2 97%   BMI 29.47 kg/m  , BMI Body mass index is 29.47 kg/m. GENERAL:  Well appearing NECK:  No jugular venous distention, waveform within normal limits, carotid upstroke brisk and symmetric, no bruits, no thyromegaly LUNGS:  Clear to auscultation bilaterally CHEST:  Unremarkable HEART:  PMI not displaced or sustained,S1 and S2 within normal limits, no S3, no S4, no  clicks, no rubs, no murmurs ABD:  Flat, positive bowel sounds normal in frequency in pitch, no bruits, no rebound, no guarding, no midline pulsatile mass, no hepatomegaly, no splenomegaly EXT:  2 plus pulses throughout, no edema, no cyanosis no clubbing   EKG:  EKG is  ordered today. The ekg ordered today demonstrates sinus bradycardia, rate 54, first-degree AV block, inferolateral and anterolateral T wave inversions unchanged from previous.  Blocked premature ventricular contractions   Recent Labs: 06/19/2021: ALT 12; Hemoglobin 13.3; Platelets 166.0 11/05/2021: BUN 16; Creatinine, Ser 1.03; Potassium 4.2; Sodium 148    Lipid Panel    Component Value Date/Time   CHOL 329 (  H) 03/29/2013 1238   TRIG 410 (H) 05/26/2013 1443   HDL 21 (L) 03/29/2013 1238   CHOLHDL 15.7 03/29/2013 1238   VLDL NOT CALC 03/29/2013 1238   LDLCALC NOT CALC 03/29/2013 1238   LDLDIRECT 89 12/09/2012 1225      Wt Readings from Last 3 Encounters:  12/26/21 205 lb 6.4 oz (93.2 kg)  12/24/21 214 lb (97.1 kg)  12/09/21 214 lb 6.4 oz (97.3 kg)      Other studies Reviewed: Additional studies/ records that were reviewed today include: Previous and most recent [employer]. Review of the above records demonstrates:  Please see elsewhere in the note.     ASSESSMENT AND PLAN:  Nonischemic cardiomyopathy:      Given the chest discomfort and the defect consistent with possible inferior infarct I need to more definitively exclude high-grade obstructive coronary disease needing intervention.  Therefore, he will have a coronary CTA.  Of note he was taking both of his Entresto tablets at the same time and of asked him to split these up.  He now has to be off beta-blockers.  I do not think he will tolerate further med titration.  He has not tolerated spironolactone in the past.  No change in therapy.  NSVT: He is not feeling premature ventricular contractions.  No change in therapy.   Bradycardia: He will remain  off beta-blockers.    Chest pain: He will have evaluation as above.  Preop: Clearance prior to another GI procedure will be pending the results above.  Current medicines are reviewed at length with the patient today.  The patient does not have concerns regarding medicines.  The following changes have been made: As above  Labs/ tests ordered today include:    Orders Placed This Encounter  Procedures   CT CORONARY MORPH W/CTA COR W/SCORE W/CA W/CM &/OR WO/CM   EKG 12-Lead     Disposition:   FU with APP  in 6 months.   Signed, Minus Breeding, MD  12/26/2021 12:41 PM    [facility]

## 2021-12-25 NOTE — Telephone Encounter (Signed)
Spoke with pt, he was in the office yesterday for nuclear stress test and per dr Stanford Breed ([place]), patient was in [place] 1 heart block. He was instructed to stop metoprolol. Follow up appointment scheduled tomorrow with dr hochrein per dr Earl Many.

## 2021-12-26 ENCOUNTER — Encounter: Payer: Self-pay | Admitting: Cardiology

## 2021-12-26 ENCOUNTER — Ambulatory Visit (INDEPENDENT_AMBULATORY_CARE_PROVIDER_SITE_OTHER): Payer: Medicare Other | Admitting: Cardiology

## 2021-12-26 VITALS — BP 101/65 | HR 54 | Ht 70.0 in | Wt 205.4 lb

## 2021-12-26 DIAGNOSIS — I428 Other cardiomyopathies: Secondary | ICD-10-CM

## 2021-12-26 DIAGNOSIS — R072 Precordial pain: Secondary | ICD-10-CM

## 2021-12-26 NOTE — Patient Instructions (Signed)
  Testing/Procedures:    Your cardiac CT will be scheduled at   [facility] [place], [place] [address] [phone]   If scheduled at [facility], please arrive at the [facility]ance (Entrance C2) of [facility] 30 minutes prior to test start time. You can use the FREE valet parking offered at entrance C (encouraged to control the heart rate for the test)  Proceed to the [facility] Radiology Department (first floor) to check-in and test prep.  All radiology patients and guests should use entrance C2 at [facility], accessed from [facility], even though the hospital's physical address listed is [address].      Please follow these instructions carefully (unless otherwise directed):  Hold all erectile dysfunction medications at least 3 days (72 hrs) prior to test.  On the Night Before the Test: Be sure to Drink plenty of water. Do not consume any caffeinated/decaffeinated beverages or chocolate 12 hours prior to your test. Do not take any antihistamines 12 hours prior to your test.   On the Day of the Test: Drink plenty of water until 1 hour prior to the test. Do not eat any food 4 hours prior to the test. You may take your regular medications prior to the test.         After the Test: Drink plenty of water. After receiving IV contrast, you may experience a mild flushed feeling. This is normal. On occasion, you may experience a mild rash up to 24 hours after the test. This is not dangerous. If this occurs, you can take Benadryl 25 mg and increase your fluid intake. If you experience trouble breathing, this can be serious. If it is severe call 911 IMMEDIATELY. If it is mild, please call our office.  We will call to schedule your test 2-4 weeks out understanding that some insurance companies will need an authorization prior to the service being performed.   For non-scheduling  related questions, please contact the cardiac imaging nurse navigator should you have any questions/concerns: Marchia Bond, Cardiac Imaging Nurse Navigator Gordy Clement, Cardiac Imaging Nurse Navigator [facility] Direct Office Dial: [phone]   For scheduling needs, including cancellations and rescheduling, please call [place], [phone].    Follow-Up: At [facility], you and your health needs are our priority.  As part of our continuing mission to provide you with exceptional heart care, we have created designated Provider Care Teams.  These Care Teams include your primary Cardiologist (physician) and Advanced Practice Providers (APPs -  Physician Assistants and Nurse Practitioners) who all work together to provide you with the care you need, when you need it.  We recommend signing up for the patient portal called "MyChart".  Sign up information is provided on this After Visit Summary.  MyChart is used to connect with patients for Virtual Visits (Telemedicine).  Patients are able to view lab/test results, encounter notes, upcoming appointments, etc.  Non-urgent messages can be sent to your provider as well.   To learn more about what you can do with MyChart, go to [email or web address].    Your next appointment:   6 month(s)  The format for your next appointment:   In Person  Provider:   Fabian Sharp PA-C {    Important Information About [place]

## 2021-12-27 ENCOUNTER — Other Ambulatory Visit (HOSPITAL_COMMUNITY): Payer: Self-pay | Admitting: *Deleted

## 2021-12-27 ENCOUNTER — Telehealth (HOSPITAL_COMMUNITY): Payer: Self-pay | Admitting: *Deleted

## 2021-12-27 DIAGNOSIS — Z01812 Encounter for preprocedural laboratory examination: Secondary | ICD-10-CM

## 2021-12-27 NOTE — Telephone Encounter (Signed)
Reaching out to patient to offer assistance regarding upcoming cardiac imaging study; pt verbalizes understanding of appt date/time, parking situation and where to check in, pre-test NPO status and verified current allergies; name and call back number provided for further questions should they arise  Jerome Clement RN Navigator Cardiac Imaging [facility] [phone] office [phone] cell  Patient aware to obtain labs prior to his cardiac CT scan and to arrive at 11am for his scan.

## 2021-12-31 ENCOUNTER — Ambulatory Visit (HOSPITAL_COMMUNITY)
Admission: RE | Admit: 2021-12-31 | Discharge: 2021-12-31 | Disposition: A | Discharge status: Home / Self Care | Payer: Medicare Other | Source: Ambulatory Visit | Attending: Cardiology | Admitting: Cardiology

## 2021-12-31 DIAGNOSIS — R072 Precordial pain: Secondary | ICD-10-CM | POA: Diagnosis present

## 2021-12-31 DIAGNOSIS — I428 Other cardiomyopathies: Secondary | ICD-10-CM | POA: Insufficient documentation

## 2021-12-31 DIAGNOSIS — I700 Atherosclerosis of aorta: Secondary | ICD-10-CM

## 2021-12-31 LAB — BASIC METABOLIC PANEL
BUN/Creatinine Ratio: 10 (ref 10–24)
BUN: 11 mg/dL (ref 8–27)
CO2: 30 mmol/L — ABNORMAL HIGH (ref 20–29)
Calcium: 9.4 mg/dL (ref 8.6–10.2)
Chloride: 96 mmol/L (ref 96–106)
Creatinine, Ser: 1.1 mg/dL (ref 0.76–1.27)
Glucose: 112 mg/dL — ABNORMAL HIGH (ref 70–99)
Potassium: 3.5 mmol/L (ref 3.5–5.2)
Sodium: 142 mmol/L (ref 134–144)
eGFR: 77 mL/min/{1.73_m2} (ref >59)

## 2021-12-31 MED ORDER — NITROGLYCERIN 0.4 MG SL SUBL
SUBLINGUAL_TABLET | SUBLINGUAL | Status: AC
  Administered 2021-12-31: 0.8 mg via SUBLINGUAL
  Filled 2021-12-31: qty 2

## 2021-12-31 MED ORDER — IOHEXOL 350 MG/ML SOLN
95.0000 mL | Freq: Once | INTRAVENOUS | Status: AC | PRN
  Administered 2021-12-31: 95 mL via INTRAVENOUS

## 2021-12-31 MED ORDER — NITROGLYCERIN 0.4 MG SL SUBL
0.8000 mg | SUBLINGUAL_TABLET | Freq: Once | SUBLINGUAL | Status: AC
Start: 2021-12-31 — End: 2021-12-31

## 2022-01-02 ENCOUNTER — Encounter: Payer: Self-pay | Admitting: *Deleted

## 2022-01-06 ENCOUNTER — Telehealth: Payer: Self-pay | Admitting: Licensed Clinical Social Worker

## 2022-01-06 NOTE — Telephone Encounter (Signed)
LCSW team received referral from Dr. Percival Spanish and Hilda Blades, [place]. Per referral pt takes care of grandchildren, needs to get them enrolled in local public school system/extra supports. I attempted to reach pt at [phone], no answer, left voicemail requesting call back.   Pt would benefit from speaking directly with [facility] at [phone] to ensure that they enroll appropriately. Will re-attempt to contact pt as able.   Westley Hummer, MSW, [place]  [phone]- work cell phone (preferred) [phone]- desk phone

## 2022-01-06 NOTE — Telephone Encounter (Signed)
LCSW received a call back from [place] (sp?) who identifies herself as the mother of pt children/pt partner.  She shares that pt passed yesterday. I offered our condolences and encouraged her to reach out if we could be of further assistance for the family. I have informed MD and RN.   Westley Hummer, MSW, [place]  [phone]- work cell phone (preferred) [phone]- desk phone

## 2022-01-07 ENCOUNTER — Encounter: Payer: Self-pay | Admitting: Gastroenterology

## 2022-01-07 ENCOUNTER — Telehealth: Payer: Self-pay | Admitting: Licensed Clinical Social Worker

## 2022-01-07 NOTE — Telephone Encounter (Signed)
Received additional call from Youlanda Roys who self identified as pt partner/shared news that he passed yesterday with this Probation officer.  She is inquiring if she can have updates about what was discussed during last appt.  I shared that I see no DPR on file to let us release information to her at this time, and also as a Education officer, museum I would not be able to discuss medical notes. If she does believe she was on a release she can try and contact our nurse line to see if they are able to discuss further or to ask the provider for more information.   Westley Hummer, MSW, [place]  [phone]- work cell phone (preferred) [phone]- desk phone

## 2022-01-07 NOTE — Progress Notes (Signed)
Received documentation from my team that patient had passed away recently.  I am sorry to hear about this passing.  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 I will forward this information to the patient's primary gastroenterologist, Dr. Ardis Hughs so that he is made aware is also. All recalls will be removed from the system.  Justice Britain, MD [facility] Office # [phone]

## 2022-01-13 ENCOUNTER — Ambulatory Visit (HOSPITAL_COMMUNITY): Admit: 2022-01-13 | Payer: Medicare Other | Admitting: Gastroenterology

## 2022-01-13 ENCOUNTER — Encounter (HOSPITAL_COMMUNITY): Payer: Self-pay

## 2022-01-13 SURGERY — ENDOSCOPIC RETROGRADE CHOLANGIOPANCREATOGRAPHY (ERCP) WITH PROPOFOL
Anesthesia: General

## 2022-01-21 ENCOUNTER — Ambulatory Visit: Payer: Medicare Other | Admitting: Physician Assistant

## 2022-01-25 DEATH — deceased

## 2022-01-31 ENCOUNTER — Other Ambulatory Visit: Payer: Self-pay | Admitting: Cardiology

## 2022-11-26 IMAGING — CT CT HEART MORP W/ CTA COR W/ SCORE W/ CA W/CM &/OR W/O CM
4 of 7 series · 8 of 20 positions shown, 9 images · non-contrast
Comparison: Overlapping portions CT abdomen 12/11/2021

Addendum:
CLINICAL DATA: 60M with chronic systolic diastolic heart failure,
hyperlipidemia, OSA, CKD 2, and tobacco abuse with chest pain.

EXAM:
Cardiac/Coronary  CT
TECHNIQUE: The patient was scanned on a [employer] scanner.

[Series 6: best diast · axial · 0.39mm/px · z∈[+1149,+1188]mm · 2 of 294 slices shown, 3 images]
[im 98/294  vessel]
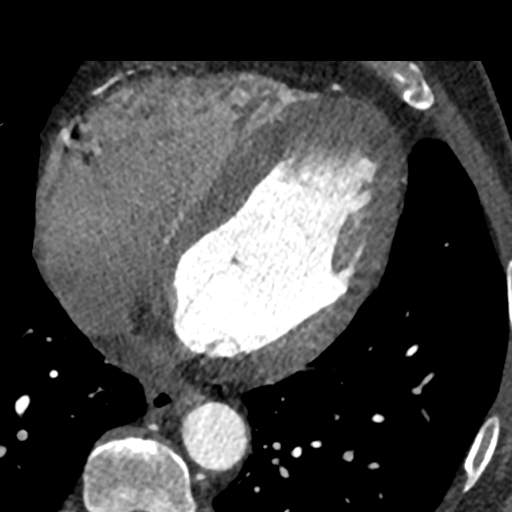
[im 98/294  lung]
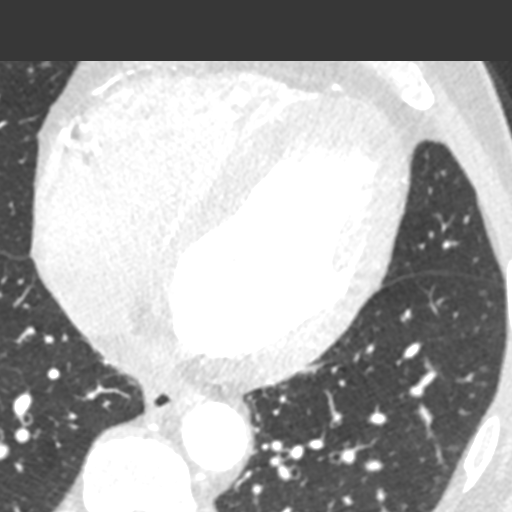
[im 196/294  vessel]
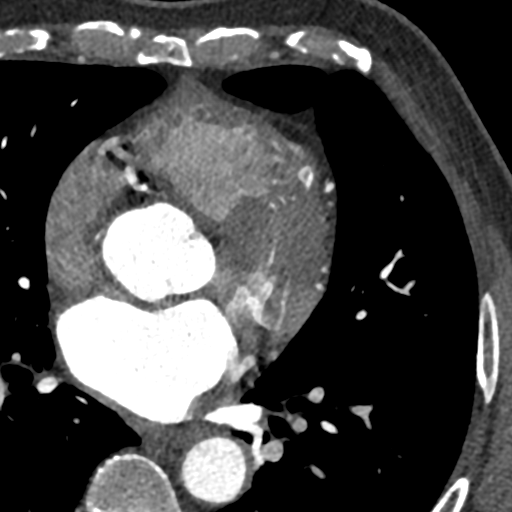

[Series 7: best syst · axial · 0.39mm/px · z∈[+1149,+1188]mm · 2 of 294 slices shown]
[im 98/294  vessel]
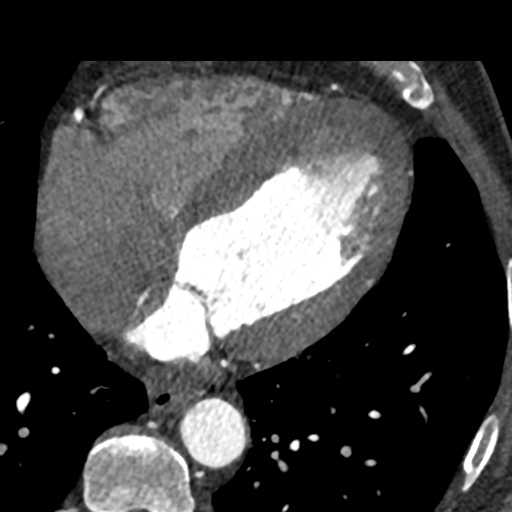
[im 196/294  vessel]
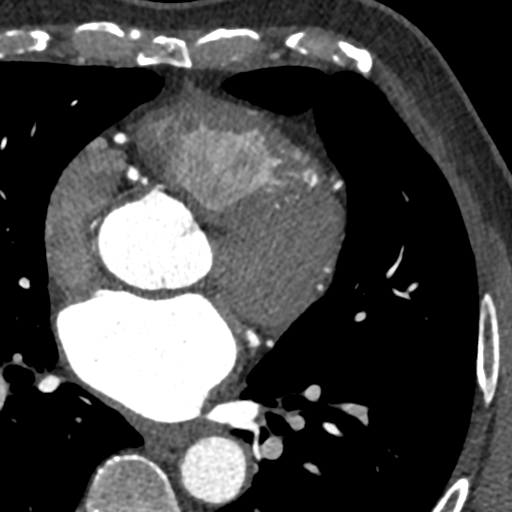

[Series 8: ts diast · axial · 0.39mm/px · z∈[+1149,+1188]mm · 2 of 294 slices shown]
[im 98/294  vessel]
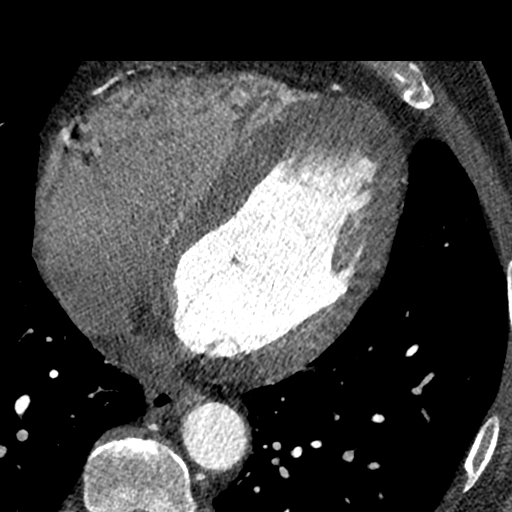
[im 196/294  vessel]
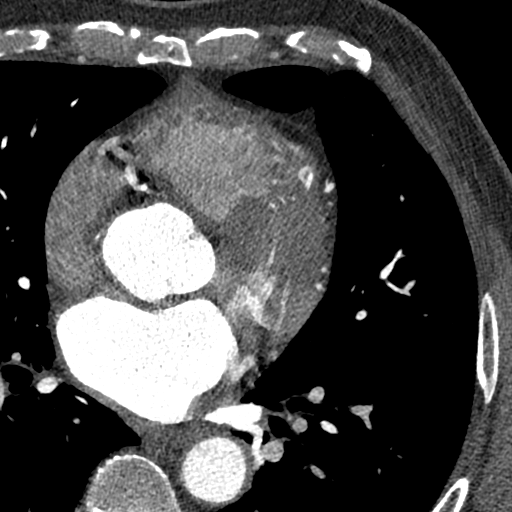

[Series 9: ts syst · axial · 0.39mm/px · z∈[+1149,+1188]mm · 2 of 294 slices shown]
[im 98/294  vessel]
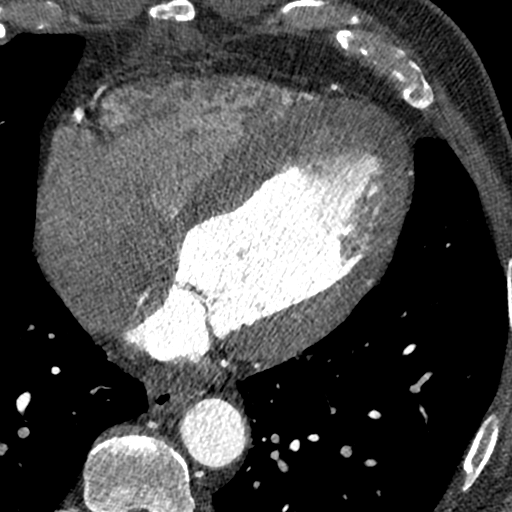
[im 196/294  vessel]
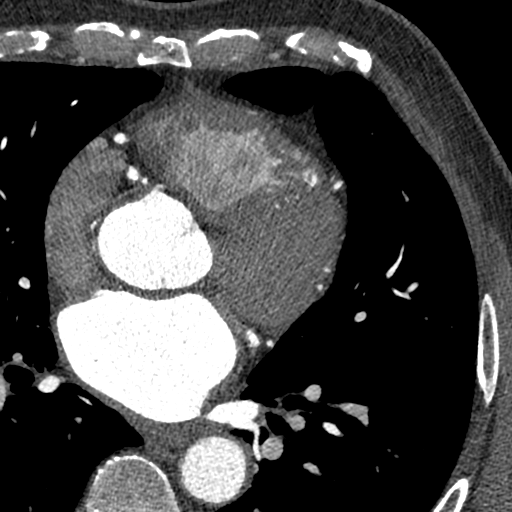

[8 of 20 positions shown; findings below may reference images not displayed]



Aorta: Normal size. Ascending aorta 3.3 cm. Aortic atherosclerosis.
No dissection.

Aortic Valve:  Trileaflet.  Mildly calcified.

Coronary Arteries:  Normal coronary origin.  Right dominance.

RCA is a large dominant artery that gives rise to PDA and PLVB.
There is no plaque.

Left main is a large artery that gives rise to LAD and LCX arteries.

LAD is a large vessel that has mild (25-49%) calcified plaque
proximally. Minimal calcified plaque distally. D1 and D2 have
minimal (<25%) mixed plaque.

LCX is a non-dominant artery that gives rise to OM1 and OM2
branches. There is mild (25-49%) calcified plaque in OM2. There is
mild (25-49%) calcified plaque.

Coronary Calcium Score:

Left main: 0

Left anterior descending artery: 293

Left circumflex artery: 122

Right coronary artery: 200

Total: 615

Percentile: 98th

Other findings:

Normal pulmonary vein drainage into the left atrium.

Normal let atrial appendage without a thrombus.

Normal size of the pulmonary artery.
IMPRESSION: 1. Coronary calcium score of 615. This was 98th percentile for age-,
race-, and sex-matched controls.

2. Normal coronary origin with right dominance.

3. There is mild (25-49%) plaque in the LAD, left circumflex, and
OM2. CAD-RADS 2.

4. Recommend aggressive risk factor modification including LDL goal
less than 70.

5.  Aortic atherosclerosis.

EXAM:
OVER-READ INTERPRETATION  CT CHEST

The following report is an over-read performed by radiologist Dr.
over-read does not include interpretation of cardiac or coronary
anatomy or pathology. The coronary CTA interpretation by the
cardiologist is attached.
FINDINGS: Extracardiac vascular: Unremarkable

Mediastinum: Unremarkable

Lung: Unremarkable

Upper abdomen: Unremarkable

Musculoskeletal: Dorsal column stimulator leads noted.
IMPRESSION: 1. No significant extracardiac findings.
2. Dorsal column stimulator leads noted.

*** End of Addendum ***
FINDINGS: A 120 kV prospective scan was triggered in the descending thoracic
aorta at 111 HU's. Axial non-contrast 3 mm slices were carried out
through the heart. The data set was analyzed on a dedicated work
station and scored using the Agatson method. Gantry rotation speed
was 250 msecs and collimation was .6 mm. No beta blockade and 0.8 mg
of sl NTG was given. The 3D data set was reconstructed in 5%
intervals of the 67-82 % of the R-R cycle. Diastolic phases were
analyzed on a dedicated work station using MPR, MIP and VRT modes.
The patient received 80 cc of contrast.

Aorta: Normal size. Ascending aorta 3.3 cm. Aortic atherosclerosis.
No dissection.

Aortic Valve:  Trileaflet.  Mildly calcified.

Coronary Arteries:  Normal coronary origin.  Right dominance.

RCA is a large dominant artery that gives rise to PDA and PLVB.
There is no plaque.

Left main is a large artery that gives rise to LAD and LCX arteries.

LAD is a large vessel that has mild (25-49%) calcified plaque
proximally. Minimal calcified plaque distally. D1 and D2 have
minimal (<25%) mixed plaque.

LCX is a non-dominant artery that gives rise to OM1 and OM2
branches. There is mild (25-49%) calcified plaque in OM2. There is
mild (25-49%) calcified plaque.

Coronary Calcium Score:

Left main: 0

Left anterior descending artery: 293

Left circumflex artery: 122

Right coronary artery: 200

Total: 615

Percentile: 98th

Other findings:

Normal pulmonary vein drainage into the left atrium.

Normal let atrial appendage without a thrombus.

Normal size of the pulmonary artery.
IMPRESSION: 1. Coronary calcium score of 615. This was 98th percentile for age-,
race-, and sex-matched controls.

2. Normal coronary origin with right dominance.

3. There is mild (25-49%) plaque in the LAD, left circumflex, and
OM2. CAD-RADS 2.

4. Recommend aggressive risk factor modification including LDL goal
less than 70.

5.  Aortic atherosclerosis.
